# Patient Record
Sex: Female | Born: 1965
Health system: Southern US, Community
[De-identification: ages and names within clinical notes are randomized; demographics above are authoritative.]

## PROBLEM LIST (undated history)

## (undated) DIAGNOSIS — F329 Major depressive disorder, single episode, unspecified: Secondary | ICD-10-CM

## (undated) DIAGNOSIS — G40909 Epilepsy, unspecified, not intractable, without status epilepticus: Secondary | ICD-10-CM

## (undated) DIAGNOSIS — J449 Chronic obstructive pulmonary disease, unspecified: Secondary | ICD-10-CM

## (undated) DIAGNOSIS — I10 Essential (primary) hypertension: Secondary | ICD-10-CM

## (undated) DIAGNOSIS — B3731 Acute candidiasis of vulva and vagina: Secondary | ICD-10-CM

## (undated) DIAGNOSIS — F419 Anxiety disorder, unspecified: Secondary | ICD-10-CM

## (undated) DIAGNOSIS — I6529 Occlusion and stenosis of unspecified carotid artery: Secondary | ICD-10-CM

## (undated) DIAGNOSIS — B373 Candidiasis of vulva and vagina: Secondary | ICD-10-CM

## (undated) DIAGNOSIS — I4891 Unspecified atrial fibrillation: Secondary | ICD-10-CM

## (undated) DIAGNOSIS — M1990 Unspecified osteoarthritis, unspecified site: Secondary | ICD-10-CM

## (undated) DIAGNOSIS — F1020 Alcohol dependence, uncomplicated: Secondary | ICD-10-CM

## (undated) DIAGNOSIS — F32A Depression, unspecified: Secondary | ICD-10-CM

## (undated) DIAGNOSIS — K5790 Diverticulosis of intestine, part unspecified, without perforation or abscess without bleeding: Secondary | ICD-10-CM

## (undated) DIAGNOSIS — E7800 Pure hypercholesterolemia, unspecified: Secondary | ICD-10-CM

## (undated) HISTORY — PX: BUNIONECTOMY: SHX129

## (undated) HISTORY — DX: Epilepsy, unspecified, not intractable, without status epilepticus: G40.909

## (undated) HISTORY — DX: Diverticulosis of intestine, part unspecified, without perforation or abscess without bleeding: K57.90

## (undated) HISTORY — DX: Candidiasis of vulva and vagina: B37.3

## (undated) HISTORY — DX: Unspecified atrial fibrillation: I48.91

## (undated) HISTORY — DX: Depression, unspecified: F32.A

## (undated) HISTORY — DX: Alcohol dependence, uncomplicated: F10.20

## (undated) HISTORY — DX: Acute candidiasis of vulva and vagina: B37.31

## (undated) HISTORY — PX: CYST EXCISION PERINEAL: SHX6278

## (undated) HISTORY — DX: Chronic obstructive pulmonary disease, unspecified: J44.9

## (undated) HISTORY — DX: Occlusion and stenosis of unspecified carotid artery: I65.29

## (undated) HISTORY — DX: Major depressive disorder, single episode, unspecified: F32.9

## (undated) HISTORY — DX: Anxiety disorder, unspecified: F41.9

## (undated) HISTORY — DX: Unspecified osteoarthritis, unspecified site: M19.90

## (undated) HISTORY — DX: Pure hypercholesterolemia, unspecified: E78.00

---

## 2001-09-27 ENCOUNTER — Ambulatory Visit (HOSPITAL_BASED_OUTPATIENT_CLINIC_OR_DEPARTMENT_OTHER): Admission: RE | Admit: 2001-09-27 | Discharge: 2001-09-27 | Payer: Self-pay | Admitting: Obstetrics and Gynecology

## 2001-09-27 ENCOUNTER — Encounter (INDEPENDENT_AMBULATORY_CARE_PROVIDER_SITE_OTHER): Payer: Self-pay | Admitting: Specialist

## 2002-04-18 ENCOUNTER — Encounter: Payer: Self-pay | Admitting: Internal Medicine

## 2002-04-18 ENCOUNTER — Encounter: Admission: RE | Admit: 2002-04-18 | Discharge: 2002-04-18 | Payer: Self-pay | Admitting: Internal Medicine

## 2006-08-23 ENCOUNTER — Emergency Department (HOSPITAL_COMMUNITY): Admission: EM | Admit: 2006-08-23 | Discharge: 2006-08-23 | Payer: Self-pay | Admitting: Emergency Medicine

## 2006-11-29 ENCOUNTER — Encounter (HOSPITAL_BASED_OUTPATIENT_CLINIC_OR_DEPARTMENT_OTHER): Admission: RE | Admit: 2006-11-29 | Discharge: 2007-01-08 | Payer: Self-pay | Admitting: Surgery

## 2007-09-02 ENCOUNTER — Emergency Department (HOSPITAL_COMMUNITY): Admission: EM | Admit: 2007-09-02 | Discharge: 2007-09-02 | Payer: Self-pay | Admitting: Emergency Medicine

## 2009-04-29 ENCOUNTER — Emergency Department (HOSPITAL_COMMUNITY): Admission: EM | Admit: 2009-04-29 | Discharge: 2009-04-29 | Payer: Self-pay | Admitting: Emergency Medicine

## 2010-03-14 ENCOUNTER — Encounter: Payer: Self-pay | Admitting: Internal Medicine

## 2010-07-06 NOTE — Assessment & Plan Note (Signed)
[facility]   NAME:  [name], [name] NO.:  [id number]   MEDICAL RECORD NO.:  [id number]      DATE OF BIRTH:  1965/06/15   PHYSICIAN:  Maxwell Caul, M.D. VISIT DATE:  12/08/2006                                   OFFICE VISIT   PURPOSE OF TODAY'S VISIT:  [name] is seen here in follow-up from her  appointment last week for follow-up of a traumatic wound in the setting  of venous stasis.  She went on to have Prisma, hydrogel and a Profore  wrap.  She returns today in follow-up.  She is without specific  complaints other than generalized pruritus in the leg for which she was  slipping a stick down I think inside the dressing.   EXAMINATION:  Temperature is 98.7, pulse 98, respirations 20, blood  pressure 137/87.  On her right leg her wound measures 1.1 x 0.8 x 0.2.  She has a slight amount of epithelialization from the superior aspect of  the wound, rest of this looks like a dry granulating base.  There was a  scant amount of slough here but I did not feel overall this needed to be  debrided.  There was no evidence of infection.   IMPRESSION:  Traumatic wound in the setting of venous stasis.  We have  gone ahead and applied double Prisma, hydrogel and rewrapped her leg.  I  think the wound appearance is generally improved.  We will see her again  in a week.           ______________________________  Maxwell Caul, M.D.     MGR/MEDQ  D:  12/08/2006  T:  12/09/2006  Job:  [id number]

## 2010-07-06 NOTE — Assessment & Plan Note (Signed)
[facility]   NAME:  GIRTRUDE, ENSLIN NO.:  [id number]   MEDICAL RECORD NO.:  [id number]      DATE OF BIRTH:  06-23-1965   PHYSICIAN:  Jake Shark A. Tanda Rockers, M.D.      VISIT DATE:                                   OFFICE VISIT   SUBJECTIVE:  Mrs. Serratore is a 45 year old lady who is seen in followup for  right anterior traumatic injury with stasis physiology.  In the interim  she has been treated with compression wrap and a silver matrix dressing.  There has been no excessive drainage, malodor, pain, or fever.   OBJECTIVE:  Blood pressure 137/70, respirations 16, pulse of 90,  temperature 98.6.  The dorsalis pedis pulse is readily palpable.  There is associated 2+  edema.  The wound itself has healthy-appearing granulation with no  drainage and no malodor.  There is no evidence of ascending infection,  abscess, or ischemia.   ASSESSMENT:  Clinical improvement of post-traumatic stasis ulcer.   PLAN:  We will follow up.  We will place the patient in an Unna boot and  reevaluate her in 7 to 10 days.      Harold A. Tanda Rockers, M.D.  Electronically Signed     HAN/MEDQ  D:  12/15/2006  T:  12/16/2006  Job:  [id number]

## 2010-07-06 NOTE — Assessment & Plan Note (Signed)
[facility]   NAME:  Susan Todd, Susan Todd NO.:  [id number]   MEDICAL RECORD NO.:  [id number]      DATE OF BIRTH:  1965-03-25   PHYSICIAN:  Maxwell Caul, M.D. VISIT DATE:  12/22/2006                                   OFFICE VISIT   PURPOSE OF TODAY'S VISIT:  Continued followup of her originally  traumatic right anterior leg wound with underlying stasis physiology.  We have been treating her with a compression wrap in terms of an Unna.  We are seeing her back in followup.   EXAMINATION:  Temperature is 98.9, pulse 93, respirations 18, blood  pressure 145/83.  Wound over the right anterior leg on the superior aspect is scabbed.  However, inferiorly shows good evidence of epithelialization.  There is  still surrounding edema around this wound.  However there is no evidence  of infection.   IMPRESSION:  Traumatic wound in the setting of venous stasis.  This is  improved dramatically.  I have given her permission to do her own graded  pressure Ace wraps rather than Unna.  I have counseled her to make sure  to be compliant with this.  I will see her back in a week.  Will  consider removal of the can scab to check the progress of the healing  under this next week, although this may not be necessary.           ______________________________  Maxwell Caul, M.D.     MGR/MEDQ  D:  12/22/2006  T:  12/22/2006  Job:  [id number]

## 2010-07-06 NOTE — Assessment & Plan Note (Signed)
[facility]   NAME:  Susan Todd, Susan Todd NO.:  [id number]   MEDICAL RECORD NO.:  [id number]      DATE OF BIRTH:  [birth year]   PHYSICIAN:  Maxwell Caul, M.D. VISIT DATE:  12/01/2006                                   OFFICE VISIT   The patient is a 45 year old woman who tripped over a concrete slab 5-6  weeks ago traumatizing the anterior aspect of her right leg.  She  developed a wound.  She has been to her primary care doctor twice and  also her gynecologist cultured the wound that was negative.  She has  apparently been on three different courses of antibiotics.  This  included Keflex and Levaquin.  The patient states the periwound area has  been pruritic but not particularly painful.  There has been no drainage,  she has not been systemically unwell.  She states the wound has  contracted a bit in terms of length and width but she feels the depth  has increased.  She has no other history of wound related issues.  She  had a bunionectomy bilaterally which have not had any healing issues.   PAST MEDICAL HISTORY:  Depression, allergies.   CURRENT MEDICATIONS:  1. Zoloft 200 daily.  2. Topamax 50 daily.  3. Zyrtec 10 daily.  4. Ativan p.r.n.   Socially, she is a smoker.  She has mentioned works for [employer] and is up on her feet a fair amount of the time as a result of  this.   EXAMINATION:  Her temperature is 98.2, pulse 89, respirations 16, blood  pressure 115/96.  GENERAL:  She is healthy looking 45 year old.  RESPIRATORY:  Clear air entry bilaterally.  CARDIAC:  Heart sounds are normal, no murmurs, there are no signs of  congestive heart failure.  EXTREMITIES:  Her peripheral pulses are well and easily felt.   WOUND EXAM:  The only wound is on the right anterior leg slightly  lateral in the mid aspect.  It measured 1 x 1.3 x 0.3.  There was a fair  amount of periwound edema and some problems I think related to  venous  varicosities.  I did not feel the wound was currently infected.  The  wound underwent a full-thickness debridement with a #15 blade, we used  EMLA for anesthesia.  This was done to remove some whitish eschar from  the top of the wound.  Underneath this was clean granulating tissue.   IMPRESSIONS AND WOUND CARE PLAN AND FOLLOWUP:  Traumatic wound in the  face of probably some degree of venous stasis.  We applied Prisma and  hydrogel to this wound and wrapped her in a Profore wrap to control the  edema.  I did not feel she needed any current antibiotics.  We will see  her again in a week's time.           ______________________________  Maxwell Caul, M.D.     MGR/MEDQ  D:  12/01/2006  T:  12/01/2006  Job:  [id number]

## 2010-07-06 NOTE — Assessment & Plan Note (Signed)
[facility]   NAME:  Susan Todd, Susan Todd NO.:  [id number]   MEDICAL RECORD NO.:  [id number]      DATE OF BIRTH:  15-Sep-1965   PHYSICIAN:  Jake Shark A. Tanda Rockers, M.D. VISIT DATE:  01/01/2007                                   OFFICE VISIT   SUBJECTIVE:  Susan Todd returns for follow-up of a post-traumatic stasis  ulcer involving her right lower extremity.  The patient was first seen  on October 10 with a post-traumatic stasis ulcer.  She was treated with  compression therapy. She returns denying drainage, malodor pain or  fever.  She feels that the wound is essentially healed.   OBJECTIVE:  Blood pressure is 141/79, respirations 16, pulse rate 92,  temperature 98.6.  Inspection of the right anterior lower extremity  shows that the wound is completely resolved.  There is 100% granulation.  There is no edema.  Pedal pulses readily palpable.   ASSESSMENT:  Resolved post-traumatic stasis ulcer.   PLAN:  We are discharging the patient to resume her care under Dr.  Donette Larry. We will reevaluate her on a p.r.n. basis.      Harold A. Tanda Rockers, M.D.  Electronically Signed     HAN/MEDQ  D:  01/01/2007  T:  01/01/2007  Job:  [id number]   cc:   Georgann Housekeeper, MD

## 2010-07-09 NOTE — Op Note (Signed)
   TNAMEMerissa, [name]                              ACCOUNT NO.:  [id number]   MEDICAL RECORD NO.:  [id number]                    PATIENT TYPE:   LOCATION:                                       FACILITY:   PHYSICIAN:  Katherine Roan, M.D.               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   PROCEDURE:  Excision of perineal cyst.   DESCRIPTION OF PROCEDURE:  The patient was placed in lithotomy position and  prepped and draped in the usual fashion. The left perineal cyst was  infiltrated, it had been marked. It was infiltrated with 0.5% Marcaine with  epinephrine and the lesion was excised. Hemostasis with the Bovie and then I  closed the subcutaneum with 4-0 Vicryl and the skin with 5-0 nylon. The  patient tolerated the procedure well and was sent to the recovery room in  good condition.                                               Katherine Roan, M.D.    SDM/MEDQ  D:  09/27/2001  T:  09/29/2001  Job:  [id number]

## 2010-10-14 ENCOUNTER — Encounter: Payer: Self-pay | Admitting: Gynecology

## 2010-10-14 ENCOUNTER — Ambulatory Visit (INDEPENDENT_AMBULATORY_CARE_PROVIDER_SITE_OTHER): Payer: 59 | Admitting: Gynecology

## 2010-10-14 ENCOUNTER — Other Ambulatory Visit (HOSPITAL_COMMUNITY)
Admission: RE | Admit: 2010-10-14 | Discharge: 2010-10-14 | Disposition: A | Discharge status: Home / Self Care | Payer: 59 | Source: Ambulatory Visit | Attending: Gynecology | Admitting: Gynecology

## 2010-10-14 VITALS — BP 106/62 | Ht 69.0 in | Wt 166.0 lb

## 2010-10-14 DIAGNOSIS — N920 Excessive and frequent menstruation with regular cycle: Secondary | ICD-10-CM

## 2010-10-14 DIAGNOSIS — F329 Major depressive disorder, single episode, unspecified: Secondary | ICD-10-CM | POA: Insufficient documentation

## 2010-10-14 DIAGNOSIS — R42 Dizziness and giddiness: Secondary | ICD-10-CM

## 2010-10-14 DIAGNOSIS — N949 Unspecified condition associated with female genital organs and menstrual cycle: Secondary | ICD-10-CM

## 2010-10-14 DIAGNOSIS — N951 Menopausal and female climacteric states: Secondary | ICD-10-CM

## 2010-10-14 DIAGNOSIS — Z01419 Encounter for gynecological examination (general) (routine) without abnormal findings: Secondary | ICD-10-CM

## 2010-10-14 DIAGNOSIS — N643 Galactorrhea not associated with childbirth: Secondary | ICD-10-CM

## 2010-10-14 DIAGNOSIS — N938 Other specified abnormal uterine and vaginal bleeding: Secondary | ICD-10-CM

## 2010-10-14 DIAGNOSIS — N926 Irregular menstruation, unspecified: Secondary | ICD-10-CM

## 2010-10-14 DIAGNOSIS — F32A Depression, unspecified: Secondary | ICD-10-CM | POA: Insufficient documentation

## 2010-10-14 DIAGNOSIS — M1990 Unspecified osteoarthritis, unspecified site: Secondary | ICD-10-CM | POA: Insufficient documentation

## 2010-10-14 DIAGNOSIS — F419 Anxiety disorder, unspecified: Secondary | ICD-10-CM | POA: Insufficient documentation

## 2010-10-14 DIAGNOSIS — Z13220 Encounter for screening for lipoid disorders: Secondary | ICD-10-CM

## 2010-10-14 LAB — COMPREHENSIVE METABOLIC PANEL
ALT: 10 U/L (ref 0–35)
AST: 14 U/L (ref 0–37)
Albumin: 4.4 g/dL (ref 3.5–5.2)
Alkaline Phosphatase: 55 U/L (ref 39–117)
BUN: 10 mg/dL (ref 6–23)
CO2: 26 mEq/L (ref 19–32)
Calcium: 9.5 mg/dL (ref 8.4–10.5)
Chloride: 104 mEq/L (ref 96–112)
Creat: 0.68 mg/dL (ref 0.50–1.10)
Glucose, Bld: 80 mg/dL (ref 70–99)
Potassium: 3.9 mEq/L (ref 3.5–5.3)
Sodium: 140 mEq/L (ref 135–145)
Total Bilirubin: 0.4 mg/dL (ref 0.3–1.2)
Total Protein: 6.3 g/dL (ref 6.0–8.3)

## 2010-10-14 NOTE — Progress Notes (Signed)
Susan Todd Jan 09, 1966 [id number]        45 y.o.  for annual exam.  Former patient of Dr. August Saucer [facility]. She notes over the last several months her periods have become irregular where she will have a monthly bleed then bleed intermittently throughout the month. This is anywhere from light staining to relatively heavy bleeding. No history of this before usually has regular monthly menses. She is not sexually active and does not use contraception. She does also note over the last 2 weeks some dizziness and lightheadedness primarily when moving and some associated nausea. She thought this may be due to stopping a medication she was taking for her arthritis as the dizziness started after stopping it. She has no other neurologic symptoms other than some mild headaches.  No visual changes or muscle weakness or sensation changes. She does note some hot flashes and sweats and some changes in her skin with dryness and cracking of her nails. She is being seen by a therapist and currently is on antidepressant and anxiolytic medication. 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 Lastly she does note a year and a half ago some greenish discharge from her left nipple she underwent a diagnostic mammogram and ultrasound both of which were normal and no further workup was done. She notes occasional greenish discharge from her left nipple over the past year. No palpable abnormalities on self-exam.  Past medical history,surgical history, allergies, family history and social history were all reviewed and documented in the EPIC chart. ROS:  Was performed and pertinent positives and negatives are included in the history.  Exam: chaperone present Filed Vitals:   10/14/10 0955  BP: 106/62   General appearance  Normal Skin grossly normal Head/Neck normal with no cervical or supraclavicular adenopathy thyroid normal Lungs   clear Cardiac RR, without RMG Abdominal  soft, nontender, without masses, organomegaly or hernia Breasts  examined lying and sitting without masses, retractions, discharge or axillary adenopathy.  Left and right nipples shows no evidence of galactorrhea or any abnormalities. Pelvic  Ext/BUS/vagina  normal   Cervix  normal  Pap done  Uterus  anteverted, normal size, shape and contour, midline and mobile nontender   Adnexa  Without masses or tenderness    Anus and perineum  normal   Rectovaginal  normal sphincter tone without palpated masses or tenderness.    Assessment/Plan:  45 y.o. female for annual exam.    #1 Irregular menses. Patient has some symptoms to suggest menopausal changes such as hot flashes and night sweats but also is having some dry skin and nail changes to suggest thyroid. When check baseline labs to include FSH TSH as well as prolactin and schedule a baseline sonohysterogram. Various scenarios were reviewed and possibilities to include structural such as polyps fibroids or hormonal such as menopause or thyroid dysfunction or prolactin abnormalities. Patient will follow up for ultrasound, lab work and then we'll from there. #2 Galactorrhea. Patient notes a greenish discharge occasionally from her left nipple she had a diagnostic mammo-with ultrasound little over a year ago. Her exam today is normal. Recommend diagnostic mammogram as long as normal she has no palpable abnormalities and will follow. I discussed with her if this ever bloody that she needs to alert me for further evaluation. We'll also check a baseline prolactin as in #1.  #3 Dizziness.  New onset dizziness. No localizing symptoms, low-grade headache  questionable related to stopping her medication. I reviewed differential to include neurologic such as tumors or other neurologic diseases, inner ear and metabolic abnormalities. Will start again with thyroid, comprehensive metabolic panel, FSH, CBC and then go from there. I  recommended that if her dizziness continues she needs to see her primary for further evaluation possible further studies such as CT scan. #4 Health maintenance. Self breast exams on a monthly basis discussed encouraged. We will schedule the diagnostic mammogram for her she knows to expect a call and if not she will call me back to arrange. Stop smoking was discussed and encouraged and she is planning to do so this coming year. Contraception is not an issue she does understand the need if she does become sexually active. Patient will follow up her lab work and her ultrasound and we'll go from there.    Dara Lords MD, 10:33 AM 10/14/2010

## 2010-10-22 ENCOUNTER — Ambulatory Visit (INDEPENDENT_AMBULATORY_CARE_PROVIDER_SITE_OTHER): Payer: 59 | Admitting: Gynecology

## 2010-10-22 ENCOUNTER — Other Ambulatory Visit: Payer: 59

## 2010-10-22 DIAGNOSIS — N920 Excessive and frequent menstruation with regular cycle: Secondary | ICD-10-CM

## 2010-10-22 DIAGNOSIS — N946 Dysmenorrhea, unspecified: Secondary | ICD-10-CM

## 2010-10-22 NOTE — Progress Notes (Signed)
Patient presents for sonohysterogram due to irregular menses.  Her FSH did return elevated at 33.  Ultrasound shows endometrial echo at 2.7 mm right left ovaries visualized and normal uterus grossly normal. Sonohysterogram performed sterile technique, EZ catheter introduction, good distention, no evidence of abnormalities.  Endometrial sample taken  Assessment and plan: #1 Irregular menses. FSH mildly elevated sonohysterogram negative endometrial biopsy was taken. Patient will follow up for results. As long as normal we'll plan menstrual calendar as long as less frequent regular menses we'll watch if she has prolonged or significantly abnormal bleeding she'll call and I discussed possibilities for intermittent progesterone withdrawal. #2 Dizzy symptoms. He notes that the symptoms as she was describing to include dizziness headaches not feeling well actually is getting better her lab work again was normal at her visit and she'll continue to observe as long as the symptoms totally resolved and will follow if not then she is to call and we'll refer for further evaluation.

## 2010-10-26 ENCOUNTER — Other Ambulatory Visit: Payer: Self-pay | Admitting: Gynecology

## 2010-10-26 DIAGNOSIS — N920 Excessive and frequent menstruation with regular cycle: Secondary | ICD-10-CM

## 2010-10-26 DIAGNOSIS — N926 Irregular menstruation, unspecified: Secondary | ICD-10-CM

## 2010-12-07 LAB — DIFFERENTIAL
Basophils Absolute: 0
Basophils Relative: 0
Eosinophils Absolute: 0
Eosinophils Relative: 0
Lymphocytes Relative: 15
Lymphs Abs: 1.4
Monocytes Absolute: 0.4
Monocytes Relative: 4
Neutro Abs: 7.8 — ABNORMAL HIGH
Neutrophils Relative %: 81 — ABNORMAL HIGH

## 2010-12-07 LAB — I-STAT 8, (EC8 V) (CONVERTED LAB)
BUN: 11
Bicarbonate: 21.7
Chloride: 105
Glucose, Bld: 118 — ABNORMAL HIGH
HCT: 45
Hemoglobin: 15.3 — ABNORMAL HIGH
Operator id: 146091
Potassium: 4.1
Sodium: 134 — ABNORMAL LOW
TCO2: 23
pCO2, Ven: 27.2 — ABNORMAL LOW
pH, Ven: 7.511 — ABNORMAL HIGH

## 2010-12-07 LAB — CBC
HCT: 42.6
Hemoglobin: 14.4
MCHC: 33.8
MCV: 97
Platelets: 300
RBC: 4.39
RDW: 14.7 — ABNORMAL HIGH
WBC: 9.6

## 2010-12-07 LAB — URINALYSIS, ROUTINE W REFLEX MICROSCOPIC
Bilirubin Urine: NEGATIVE
Glucose, UA: NEGATIVE
Hgb urine dipstick: NEGATIVE
Ketones, ur: NEGATIVE
Nitrite: NEGATIVE
Protein, ur: NEGATIVE
Specific Gravity, Urine: 1.018
Urobilinogen, UA: 0.2
pH: 7

## 2010-12-07 LAB — RAPID URINE DRUG SCREEN, HOSP PERFORMED
Amphetamines: NOT DETECTED
Barbiturates: NOT DETECTED
Benzodiazepines: NOT DETECTED
Cocaine: NOT DETECTED
Opiates: NOT DETECTED
Tetrahydrocannabinol: NOT DETECTED

## 2010-12-07 LAB — ETHANOL: Alcohol, Ethyl (B): <5

## 2010-12-07 LAB — POCT I-STAT CREATININE
Creatinine, Ser: 0.7
Operator id: 146091

## 2010-12-07 LAB — PREGNANCY, URINE: Preg Test, Ur: NEGATIVE

## 2010-12-07 LAB — LIPASE, BLOOD: Lipase: 22

## 2011-08-10 ENCOUNTER — Other Ambulatory Visit: Payer: Self-pay | Admitting: *Deleted

## 2011-08-10 DIAGNOSIS — G43909 Migraine, unspecified, not intractable, without status migrainosus: Secondary | ICD-10-CM

## 2011-08-10 DIAGNOSIS — G9001 Carotid sinus syncope: Secondary | ICD-10-CM

## 2011-08-10 DIAGNOSIS — IMO0002 Reserved for concepts with insufficient information to code with codable children: Secondary | ICD-10-CM

## 2011-08-10 DIAGNOSIS — R110 Nausea: Secondary | ICD-10-CM

## 2011-08-12 ENCOUNTER — Ambulatory Visit
Admission: RE | Admit: 2011-08-12 | Discharge: 2011-08-12 | Disposition: A | Discharge status: Home / Self Care | Payer: BC Managed Care – PPO | Source: Ambulatory Visit | Attending: *Deleted | Admitting: *Deleted

## 2011-08-12 ENCOUNTER — Ambulatory Visit (HOSPITAL_COMMUNITY)
Admission: RE | Admit: 2011-08-12 | Discharge: 2011-08-12 | Disposition: A | Discharge status: Home / Self Care | Payer: BC Managed Care – PPO | Source: Ambulatory Visit | Attending: Internal Medicine | Admitting: Internal Medicine

## 2011-08-12 ENCOUNTER — Emergency Department (HOSPITAL_COMMUNITY)
Admission: EM | Admit: 2011-08-12 | Discharge: 2011-08-12 | Disposition: A | Discharge status: Home / Self Care | Payer: BC Managed Care – PPO | Attending: Emergency Medicine | Admitting: Emergency Medicine

## 2011-08-12 ENCOUNTER — Encounter (HOSPITAL_COMMUNITY): Payer: Self-pay | Admitting: Emergency Medicine

## 2011-08-12 ENCOUNTER — Other Ambulatory Visit: Payer: Self-pay | Admitting: Internal Medicine

## 2011-08-12 DIAGNOSIS — R51 Headache: Secondary | ICD-10-CM

## 2011-08-12 DIAGNOSIS — R110 Nausea: Secondary | ICD-10-CM

## 2011-08-12 DIAGNOSIS — F17200 Nicotine dependence, unspecified, uncomplicated: Secondary | ICD-10-CM | POA: Insufficient documentation

## 2011-08-12 DIAGNOSIS — I6529 Occlusion and stenosis of unspecified carotid artery: Secondary | ICD-10-CM

## 2011-08-12 DIAGNOSIS — M542 Cervicalgia: Secondary | ICD-10-CM

## 2011-08-12 DIAGNOSIS — G9001 Carotid sinus syncope: Secondary | ICD-10-CM

## 2011-08-12 DIAGNOSIS — R6884 Jaw pain: Secondary | ICD-10-CM | POA: Insufficient documentation

## 2011-08-12 DIAGNOSIS — G43909 Migraine, unspecified, not intractable, without status migrainosus: Secondary | ICD-10-CM

## 2011-08-12 DIAGNOSIS — R221 Localized swelling, mass and lump, neck: Secondary | ICD-10-CM

## 2011-08-12 DIAGNOSIS — IMO0002 Reserved for concepts with insufficient information to code with codable children: Secondary | ICD-10-CM

## 2011-08-12 DIAGNOSIS — M129 Arthropathy, unspecified: Secondary | ICD-10-CM | POA: Insufficient documentation

## 2011-08-12 DIAGNOSIS — R220 Localized swelling, mass and lump, head: Secondary | ICD-10-CM

## 2011-08-12 LAB — CBC
HCT: 40.6 % (ref 36.0–46.0)
Hemoglobin: 13.9 g/dL (ref 12.0–15.0)
MCH: 33.4 pg (ref 26.0–34.0)
MCHC: 34.2 g/dL (ref 30.0–36.0)
MCV: 97.6 fL (ref 78.0–100.0)
Platelets: 200 10*3/uL (ref 150–400)
RBC: 4.16 MIL/uL (ref 3.87–5.11)
RDW: 12.7 % (ref 11.5–15.5)
WBC: 7.7 10*3/uL (ref 4.0–10.5)

## 2011-08-12 LAB — BASIC METABOLIC PANEL
BUN: 13 mg/dL (ref 6–23)
CO2: 26 mEq/L (ref 19–32)
Calcium: 9.3 mg/dL (ref 8.4–10.5)
Chloride: 102 mEq/L (ref 96–112)
Creatinine, Ser: 0.65 mg/dL (ref 0.50–1.10)
GFR calc Af Amer: >90 mL/min (ref >90)
GFR calc non Af Amer: >90 mL/min (ref >90)
Glucose, Bld: 86 mg/dL (ref 70–99)
Potassium: 4.2 mEq/L (ref 3.5–5.1)
Sodium: 138 mEq/L (ref 135–145)

## 2011-08-12 LAB — DIFFERENTIAL
Basophils Absolute: 0 10*3/uL (ref 0.0–0.1)
Basophils Relative: 0 % (ref 0–1)
Eosinophils Absolute: 0.2 10*3/uL (ref 0.0–0.7)
Eosinophils Relative: 2 % (ref 0–5)
Lymphocytes Relative: 37 % (ref 12–46)
Lymphs Abs: 2.9 10*3/uL (ref 0.7–4.0)
Monocytes Absolute: 0.6 10*3/uL (ref 0.1–1.0)
Monocytes Relative: 7 % (ref 3–12)
Neutro Abs: 4.1 10*3/uL (ref 1.7–7.7)
Neutrophils Relative %: 53 % (ref 43–77)

## 2011-08-12 LAB — SEDIMENTATION RATE: Sed Rate: 7 mm/hr (ref 0–22)

## 2011-08-12 MED ORDER — GADOBENATE DIMEGLUMINE 529 MG/ML IV SOLN
20.0000 mL | Freq: Once | INTRAVENOUS | Status: AC | PRN
  Administered 2011-08-12: 20 mL via INTRAVENOUS

## 2011-08-12 MED ORDER — MORPHINE SULFATE 4 MG/ML IJ SOLN
4.0000 mg | Freq: Once | INTRAMUSCULAR | Status: AC
  Administered 2011-08-12: 4 mg via INTRAVENOUS
  Filled 2011-08-12: qty 1

## 2011-08-12 MED ORDER — ONDANSETRON HCL 4 MG/2ML IJ SOLN
4.0000 mg | Freq: Once | INTRAMUSCULAR | Status: AC
  Administered 2011-08-12: 4 mg via INTRAVENOUS
  Filled 2011-08-12: qty 2

## 2011-08-12 NOTE — ED Notes (Signed)
Luciano Cutter, RN made aware of patient status.

## 2011-08-12 NOTE — ED Notes (Signed)
Susan Todd (husband) [phone] cell phone

## 2011-08-12 NOTE — Progress Notes (Signed)
VASCULAR LAB PRELIMINARY  PRELIMINARY  PRELIMINARY  PRELIMINARY  Carotid duplex  completed.    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  No significant ICA stenosis on left.  Report called to [facility].  Patient instructed to return to office. Florestine Avers, 08/12/2011, 9:56 PM

## 2011-08-12 NOTE — Discharge Instructions (Signed)

## 2011-08-12 NOTE — ED Provider Notes (Signed)
History     CSN: [id number]  Arrival date & time 08/12/11  1721   First MD Initiated Contact with Patient 08/12/11 1824      Chief Complaint  Patient presents with  . Neck Pain    Patient is a 46 y.o. female presenting with neck pain. The history is provided by the patient.  Neck Pain  This is a new problem. The current episode started more than 2 days ago. The problem occurs constantly. The problem has been gradually worsening. The pain is associated with nothing. There has been no fever. The pain is present in the right side. The quality of the pain is described as stabbing. Radiates to: head. The pain is moderate. The symptoms are aggravated by swallowing (palpation). The pain is the same all the time. Associated symptoms include headaches. Pertinent negatives include no photophobia, no chest pain, no syncope, no numbness and no weakness.  pt presents for neck pain and headache Reports headache for at least 3 weeks.  She reports in past week she developed right sided anterior neck pain, but no trauma/falls reported.  No focal weakness.  No cp/sob.  No syncope, no dizziness.  No hearing changes.  No visual changes.  No dysarthria.  No neck trauma.  No fever.  No vomiting. She reports it hurts to swallow but no neck swelling  Had mra done of neck that showed carotid stenosis and also has carotid US done and prelim report shows 80% stenosis, but no dissection is reported on mra.  Sent to ER by PCP for further evaluation  She denies h/o CAD/CVA  Past Medical History  Diagnosis Date  . Depression   . Anxiety   . Arthritis     Past Surgical History  Procedure Date  . Bunionectomy     BOTH FEET    Family History  Problem Relation Age of Onset  . Heart disease Mother   . Cancer Father     MELANOMA  . Cancer Brother     MELANOMA  . Heart disease Maternal Aunt     History  Substance Use Topics  . Smoking status: Current Everyday Smoker -- 1.3 packs/day  . Smokeless tobacco:  Never Used  . Alcohol Use: No    OB History    Grav Para Term Preterm Abortions TAB SAB Ect Mult Living   3 2   1     2       Review of Systems  Constitutional: Negative for fever.  HENT: Positive for neck pain.   Eyes: Negative for photophobia.  Cardiovascular: Negative for chest pain and syncope.  Neurological: Positive for headaches. Negative for weakness and numbness.  All other systems reviewed and are negative.    Allergies  Codeine and Latex  Home Medications   Current Outpatient Rx  Name Route Sig Dispense Refill  . CITALOPRAM HYDROBROMIDE 40 MG PO TABS Oral Take 40 mg by mouth daily.      Marland Kitchen LORAZEPAM 1 MG PO TABS Oral Take 1 mg by mouth every 8 (eight) hours as needed. For anxiety      BP 115/76  Pulse 79  Temp 99.2 F (37.3 C) (Oral)  Resp 18  SpO2 97%  Physical Exam CONSTITUTIONAL: Well developed/well nourished HEAD AND FACE: Normocephalic/atraumatic EYES: EOMI/PERRL ENMT: Mucous membranes moist NECK: supple no meningeal signs, no bruits noted.  No visible swelling to neck. SPINE:entire spine nontender CV: S1/S2 noted, no murmurs/rubs/gallops noted LUNGS: Lungs are clear to auscultation bilaterally, no apparent distress  ABDOMEN: soft, nontender, no rebound or guarding GU:no cva tenderness NEURO: Awake/alert, facies symmetric, no arm or leg drift is noted Cranial nerves 3/4/5/6/08/29/08/11/12 tested and intact Gait normal EXTREMITIES: pulses normal, full ROM SKIN: warm, color normal PSYCH: no abnormalities of mood noted  ED Course  Procedures   Labs Reviewed  CBC  DIFFERENTIAL  BASIC METABOLIC PANEL   Mr Angiogram Neck W Wo Contrast  08/12/2011  *RADIOLOGY REPORT*  Clinical Data:  Migraine headache.  Nausea  MRI HEAD WITHOUT CONTRAST  Technique:  Multiplanar, multiecho pulse sequences of the brain and surrounding structures were obtained without intravenous contrast.  Comparison:  CT head 11/11/2005  Findings:  Ventricle size is normal.   Negative for Chiari malformation.  Pituitary gland is normal in size.  Scattered small white matter hyperintensities are present bilaterally.  Brainstem and cerebellum are normal.  No acute infarct.  There are no areas of restricted diffusion  Negative for hemorrhage.  No mass or edema is present in the brain. Normal flow void in the circle of Willis vessels at the base of the brain.  Paranasal sinuses are clear.  IMPRESSION:  No acute abnormality.  Scattered small white matter hyperintensities bilaterally which may be due to migraine headaches or chronic microvascular ischemia.  *RADIOLOGY REPORT*  Clinical Data:  Migraine headache.  Nausea  MRA NECK WITHOUT AND WITH CONTRAST  Technique:  Angiographic images of the neck were obtained using MRA technique without and with intravenous contrast.  Carotid stenosis measurements (when applicable) are obtained utilizing NASCET criteria, using the distal internal carotid diameter as the denominator.  Contrast: 20mL MULTIHANCE GADOBENATE DIMEGLUMINE 529 MG/ML IV SOLN  Comparison:   None.  Findings:  Arterial enhancement is suboptimal due to technical considerations.  Right carotid:  Right common carotid artery appears patent.  There appears to be a focal significant stenosis of the right internal carotid artery proximally.  This is difficult to quantify but may be the greater than seen 60%.  Correlate with carotid Doppler.  Left carotid:  Left carotid appears to be widely patent without significant stenosis.  Vertebral arteries:  Both vertebral arteries are equal in size and patent to the basilar.  IMPRESSION: Focal stenosis of the proximal right internal carotid artery may be a significant stenosis, and may be greater than 60% diameter stenosis.  Carotid Doppler suggested for further evaluation.  Original Report Authenticated By: Camelia Phenes, M.D.   Mr Brain Wo Contrast  08/12/2011  *RADIOLOGY REPORT*  Clinical Data:  Migraine headache.  Nausea  MRI HEAD WITHOUT  CONTRAST  Technique:  Multiplanar, multiecho pulse sequences of the brain and surrounding structures were obtained without intravenous contrast.  Comparison:  CT head 11/11/2005  Findings:  Ventricle size is normal.  Negative for Chiari malformation.  Pituitary gland is normal in size.  Scattered small white matter hyperintensities are present bilaterally.  Brainstem and cerebellum are normal.  No acute infarct.  There are no areas of restricted diffusion  Negative for hemorrhage.  No mass or edema is present in the brain. Normal flow void in the circle of Willis vessels at the base of the brain.  Paranasal sinuses are clear.  IMPRESSION:  No acute abnormality.  Scattered small white matter hyperintensities bilaterally which may be due to migraine headaches or chronic microvascular ischemia.  *RADIOLOGY REPORT*  Clinical Data:  Migraine headache.  Nausea  MRA NECK WITHOUT AND WITH CONTRAST  Technique:  Angiographic images of the neck were obtained using MRA technique without and with  intravenous contrast.  Carotid stenosis measurements (when applicable) are obtained utilizing NASCET criteria, using the distal internal carotid diameter as the denominator.  Contrast: 20mL MULTIHANCE GADOBENATE DIMEGLUMINE 529 MG/ML IV SOLN  Comparison:   None.  Findings:  Arterial enhancement is suboptimal due to technical considerations.  Right carotid:  Right common carotid artery appears patent.  There appears to be a focal significant stenosis of the right internal carotid artery proximally.  This is difficult to quantify but may be the greater than seen 60%.  Correlate with carotid Doppler.  Left carotid:  Left carotid appears to be widely patent without significant stenosis.  Vertebral arteries:  Both vertebral arteries are equal in size and patent to the basilar.  IMPRESSION: Focal stenosis of the proximal right internal carotid artery may be a significant stenosis, and may be greater than 60% diameter stenosis.  Carotid  Doppler suggested for further evaluation.  Original Report Authenticated By: Camelia Phenes, M.D.   7:03 PM Spoke to dr Myra Gianotti with vascular  We discussed her neuro exam (no focal findings) Unlikely not a carotid dissection given not seen on mra and not seen on carotid ultrasound He recommends check labs/sed rate to r/o temporal arteritis.  Treat pain.  She can f/u early next week but does not need urgent admission/repair   Pt improved, feels well for d/c home Discussed strict return precautions We discussed need for vascular followup Also discussed need to stay out of work until vascular eval   MDM  Nursing notes including past medical history and social history reviewed and considered in documentation labs/vitals reviewed and considered Previous records reviewed and considered - mra results reviewed         Joya Gaskins, MD 08/12/11 2031

## 2011-08-12 NOTE — ED Notes (Signed)
Pt reports having R sided neck pain X 5days that radiates up to head; describes as aching stabbing pain; unsure of injury; pt able to move head

## 2011-08-15 ENCOUNTER — Other Ambulatory Visit: Payer: BC Managed Care – PPO

## 2011-08-19 ENCOUNTER — Encounter: Payer: Self-pay | Admitting: Surgery

## 2011-08-22 ENCOUNTER — Encounter: Payer: Self-pay | Admitting: Surgery

## 2011-08-22 ENCOUNTER — Other Ambulatory Visit (INDEPENDENT_AMBULATORY_CARE_PROVIDER_SITE_OTHER): Payer: BC Managed Care – PPO | Admitting: *Deleted

## 2011-08-22 ENCOUNTER — Ambulatory Visit (INDEPENDENT_AMBULATORY_CARE_PROVIDER_SITE_OTHER): Payer: BC Managed Care – PPO | Admitting: Surgery

## 2011-08-22 VITALS — BP 119/76 | HR 73 | Temp 98.6°F | Ht 70.0 in | Wt 174.0 lb

## 2011-08-22 DIAGNOSIS — I6529 Occlusion and stenosis of unspecified carotid artery: Secondary | ICD-10-CM

## 2011-08-22 NOTE — Progress Notes (Signed)
[facility] [place]   Patient name: Susan Todd MRN: [id number] DOB: 03/14/1965 Sex: female   Referred by: Dr. Donette Larry  Reason for referral:  Chief Complaint  Patient presents with  . Carotid    New pt    HISTORY OF PRESENT ILLNESS: The patient comes in today for evaluation of carotid occlusive disease. She recently developed headaches which were severe. They were associated with nausea as well as a right neck pain. She underwent a MRI which raised the possibility of a stenosis within the right internal carotid artery. This was further evaluated with carotid duplex which revealed greater than 80% stenosis. Initially, there was concern over a dissection however the MRI and carotid ultrasound to exclude this possibility. And she does not endorse any neurologic symptoms. Specifically she denies numbness or weakness in either extremity. She denies slurred speech. She denies amaurosis fugax. She still states that she has a headache but is much better.  The patient does have a significant smoking history. She also states that she suffers from mild COPD as a result of her cigarette smoking. She has occasional irregular heartbeat and is managed also for anemia.  Past Medical History  Diagnosis Date  . Depression   . Anxiety   . Arthritis   . AAA (abdominal aortic aneurysm)   . Irregular heartbeat   . COPD (chronic obstructive pulmonary disease)     Past Surgical History  Procedure Date  . Bunionectomy     BOTH FEET    History   Social History  . Marital Status: Married    Spouse Name: N/A    Number of Children: N/A  . Years of Education: N/A   Occupational History  . Not on file.   Social History Main Topics  . Smoking status: Current Everyday Smoker -- 1.5 packs/day for 30 years    Types: Cigarettes  . Smokeless tobacco: Never Used  . Alcohol Use: No  . Drug Use: No     abused drugs in the past 1989  . Sexually Active: No   Other Topics Concern  .  Not on file   Social History Narrative  . No narrative on file    Family History  Problem Relation Age of Onset  . Heart disease Mother   . Cancer Father     MELANOMA  . Cancer Brother     MELANOMA  . Heart disease Maternal Aunt     Allergies as of 08/22/2011 - Review Complete 08/22/2011  Allergen Reaction Noted  . Codeine Nausea Only   . Latex Hives     Current Outpatient Prescriptions on File Prior to Visit  Medication Sig Dispense Refill  . citalopram (CELEXA) 40 MG tablet Take 40 mg by mouth daily.        Marland Kitchen LORazepam (ATIVAN) 1 MG tablet Take 1 mg by mouth every 8 (eight) hours as needed. For anxiety         REVIEW OF SYSTEMS: Cardiovascular: Positive for palpitations Pulmonary: Positive for shortness of breath with exertion Neurologic: No weakness, paresthesias, aphasia, or amaurosis. No dizziness. Hematologic: No bleeding problems or clotting disorders. Musculoskeletal: No joint pain or joint swelling. Gastrointestinal: No blood in stool or hematemesis Genitourinary: No dysuria or hematuria. Psychiatric:: No history of major depression. Integumentary: No rashes or ulcers. Constitutional: No fever or chills.  PHYSICAL EXAMINATION: General: The patient appears their stated age.  Vital signs are BP 119/76  Pulse 73  Temp 98.6 F (37 C) (Oral)  Ht 5\' 10"  (1.778 m)  Wt 174 lb (78.926 kg)  BMI 24.97 kg/m2  SpO2 100% HEENT:  No gross abnormalities Pulmonary: Respirations are non-labored Abdomen: Soft and non-tender  Musculoskeletal: There are no major deformities.   Neurologic: No focal weakness or paresthesias are detected, Skin: There are no ulcer or rashes noted. Psychiatric: The patient has normal affect. Cardiovascular: There is a regular rate and rhythm without significant murmur appreciated. No carotid bruits. Palpable pedal pulses  Diagnostic Studies: I repeat her carotid ultrasound today. In our office there is a stenosis about 1.3 cm distal to the  carotid bifurcation. Velocities suggest a high and 60-79% stenosis. End diastolic velocities were 1 10 cm/s  Outside Studies/Documentation Historical records were reviewed.  They showed a MRI which revealed approximately 60% carotid stenosis and a ultrasound which revealed greater than 80% stenosis Medication Changes: None  Assessment:  Asymptomatic carotid stenosis, right Plan: I do not believe that her carotid stenosis is related to her headaches or neck pain. I believe this is an incidental finding during her workup. I discussed the role of medical therapy in this situation. I stressed the need for smoking cessation. To be 46 years old and had significant carotid occlusive disease is very young. Prior to proceeding with any form of carotid intervention I feel like we need to get her risk factors under control. She will need to have her cholesterol evaluated if it has not already been done. I would prefer to start her on a statin if at all possible. I've also stressed the importance of blood pressure control. Her blood pressure was normal in the office today. I started her on a baby aspirin today as well. I told her I would like her to see me in 6 months after she has initiated these risks factors modifications. At that time we will repeat her ultrasound and make recommendations based on that. We discussed the possibility of stroke both with intervention and without intervention. I told her that she is at risk for stroke because of her findings. I told her to go to the emergency department should be happen. She will see me back in 6 months.     Jorge Ny, M.D. [facility] [place] Office: [phone] Pager:  [phone]

## 2011-08-22 NOTE — Addendum Note (Signed)
Addended by: Sharee Pimple on: 08/22/2011 12:01 PM   Modules accepted: Orders

## 2011-08-29 NOTE — Procedures (Unsigned)
CAROTID DUPLEX EXAM  INDICATION:  Follow up carotid disease.  HISTORY: Diabetes:  No Cardiac:  No Hypertension:  Yes Smoking:  Yes Previous Surgery:  No CV History:  Headaches Amaurosis Fugax No, Paresthesias No, Hemiparesis No                                      RIGHT             LEFT Brachial systolic pressure:         115               119 Brachial Doppler waveforms: Vertebral direction of flow:        Antegrade DUPLEX VELOCITIES (cm/sec) CCA peak systolic                   87 ECA peak systolic                   117 ICA peak systolic                   285 ICA end diastolic                   110 PLAQUE MORPHOLOGY:                  Homogeneous PLAQUE AMOUNT:                      Moderate to severe PLAQUE LOCATION:                    ICA  IMPRESSION: 1. Focal stenosis of the right ICA approximately 1.3 cm distal to the     bifurcation. 2. Velocities suggest high-end 60% to 79% stenosis. 3. The ICA appears normal beyond the lesion.  ___________________________________________ V. Charlena Cross, MD  LT/MEDQ  D:  08/22/2011  T:  08/22/2011  Job:  [id number]

## 2012-03-05 ENCOUNTER — Ambulatory Visit: Payer: BC Managed Care – PPO | Admitting: Surgery

## 2012-03-05 ENCOUNTER — Other Ambulatory Visit: Payer: BC Managed Care – PPO

## 2012-03-16 ENCOUNTER — Encounter: Payer: Self-pay | Admitting: Surgery

## 2012-03-19 ENCOUNTER — Encounter: Payer: Self-pay | Admitting: Surgery

## 2012-03-19 ENCOUNTER — Ambulatory Visit (INDEPENDENT_AMBULATORY_CARE_PROVIDER_SITE_OTHER): Payer: 59 | Admitting: Surgery

## 2012-03-19 ENCOUNTER — Other Ambulatory Visit (INDEPENDENT_AMBULATORY_CARE_PROVIDER_SITE_OTHER): Payer: 59 | Admitting: *Deleted

## 2012-03-19 VITALS — BP 121/69 | HR 59 | Ht 70.0 in | Wt 182.2 lb

## 2012-03-19 DIAGNOSIS — I6529 Occlusion and stenosis of unspecified carotid artery: Secondary | ICD-10-CM

## 2012-03-19 NOTE — Progress Notes (Signed)
[facility] [place]   Patient name: Susan Todd MRN: [id number] DOB: Mar 13, 1965 Sex: female     Chief Complaint  Patient presents with  . Carotid    6 month f/u     HISTORY OF PRESENT ILLNESS: The patient is back today for followup of her right carotid stenosis. I initially met her 6 months ago. At that time she had undergone a MR I. which had suggested carotid stenosis and the possibility of a dissection. She also had a carotid ultrasound which showed greater than 80% stenosis. The stents were obtained secondary to headaches as well as right facial and neck pain. Her ultrasound was repeated in my office, and it looked more consistent with a 60-79% stenosis. The patient was asymptomatic from a carotid perspective. I made the decision to manage her medically as she continued to smoke and had not then on cholesterol medication. She comes in today for a repeat carotid ultrasound. Since she was last year she has had increasing stressors in her life, including her son having to car accidents leaving him with some neurologic deficits. She has not been able to quit smoking but has cutback. She has tried to manage her cholesterol from a dietary perspective. She is scheduled to see Dr. Donette Larry in the immediate future. She has not had any neurologic symptoms since I last saw her. Specifically she has not had an episode of amaurosis fugax, slurred speech, numbness or weakness in either extremity. Her symptoms had originally led her to this point have resolved. They have been extruded distress.  Past Medical History  Diagnosis Date  . Depression   . Anxiety   . Arthritis   . AAA (abdominal aortic aneurysm)   . Irregular heartbeat   . COPD (chronic obstructive pulmonary disease)     Past Surgical History  Procedure Date  . Bunionectomy     BOTH FEET    History   Social History  . Marital Status: Married    Spouse Name: N/A    Number of Children: N/A  . Years of Education:  N/A   Occupational History  . Not on file.   Social History Main Topics  . Smoking status: Current Every Day Smoker -- 1.0 packs/day for 30 years    Types: Cigarettes  . Smokeless tobacco: Never Used     Comment: pt states that she knows she needs to quit but has been stressed and uses cigs as a resort  . Alcohol Use: No  . Drug Use: No     Comment: abused drugs in the past 1989  . Sexually Active: No   Other Topics Concern  . Not on file   Social History Narrative  . No narrative on file    Family History  Problem Relation Age of Onset  . Heart disease Mother   . Diabetes Mother   . Hyperlipidemia Mother   . Cancer Father     MELANOMA  . Cancer Brother     MELANOMA  . Hyperlipidemia Brother   . Heart disease Maternal Aunt     Allergies as of 03/19/2012 - Review Complete 03/19/2012  Allergen Reaction Noted  . Codeine Nausea Only   . Latex Hives     Current Outpatient Prescriptions on File Prior to Visit  Medication Sig Dispense Refill  . citalopram (CELEXA) 40 MG tablet Take 40 mg by mouth daily.        Marland Kitchen LORazepam (ATIVAN) 1 MG tablet Take 1 mg by  mouth every 8 (eight) hours as needed. For anxiety      . urea (CARMOL) 40 % CREA Apply topically 2 (two) times daily.      Marland Kitchen HYDROcodone-acetaminophen (VICODIN) 5-500 MG per tablet Take 5 tablets by mouth every 6 (six) hours as needed.      . terbinafine (LAMISIL) 250 MG tablet Take 250 mg by mouth daily.         REVIEW OF SYSTEMS: Positive for shortness of breath with exertion, varicose veins. History of depression All of the systems are negative as documented by the patient and the encounter form  PHYSICAL EXAMINATION:   Vital signs are BP 121/69  Pulse 59  Ht 5\' 10"  (1.778 m)  Wt 182 lb 3.2 oz (82.645 kg)  BMI 26.14 kg/m2  SpO2 100% General: The patient appears their stated age. HEENT:  No gross abnormalities Pulmonary:  Non labored breathing Musculoskeletal: There are no major deformities. Neurologic:  No focal weakness or paresthesias are detected, Skin: There are no ulcer or rashes noted. Psychiatric: The patient has normal affect. Cardiovascular: There is a regular rate and rhythm without significant murmur appreciated. No carotid bruits   Diagnostic Studies Ultrasound was reviewed today. This shows 60-79% stenosis of the right internal carotid artery. There is a suggestion that this could be due to change and vessel diameter. Approximately 40% left carotid stenosis is identified. This is not significantly changed from the study in July 2013.  Assessment: Asymptomatic bilateral carotid stenosis, right greater than left Plan: Since the patient continues to be less than 80% stenosis on the right, I would again recommend medical therapy. I have encouraged her to cut back on her cigarettes and try to completely stop smoking. I also told her that I feel strongly she should be on a statin, almost regardless of her cholesterol profile given her advanced atherosclerotic disease is such a young age. We again went over the signs and symptoms of TIA/stroke and what to do should these occur. I will have her come back to see me in one year with a repeat carotid ultrasound.  Jorge Ny, M.D. [facility] [place] Office: [phone] Pager:  [phone]

## 2012-03-19 NOTE — Addendum Note (Signed)
Addended by: Sharee Pimple on: 03/19/2012 01:50 PM   Modules accepted: Orders

## 2012-11-21 ENCOUNTER — Ambulatory Visit (INDEPENDENT_AMBULATORY_CARE_PROVIDER_SITE_OTHER): Payer: 59 | Admitting: Women's Health

## 2012-11-21 ENCOUNTER — Encounter: Payer: Self-pay | Admitting: Women's Health

## 2012-11-21 DIAGNOSIS — B373 Candidiasis of vulva and vagina: Secondary | ICD-10-CM

## 2012-11-21 DIAGNOSIS — Z113 Encounter for screening for infections with a predominantly sexual mode of transmission: Secondary | ICD-10-CM

## 2012-11-21 DIAGNOSIS — N898 Other specified noninflammatory disorders of vagina: Secondary | ICD-10-CM

## 2012-11-21 LAB — WET PREP FOR TRICH, YEAST, CLUE
Clue Cells Wet Prep HPF POC: NONE SEEN
Trich, Wet Prep: NONE SEEN

## 2012-11-21 MED ORDER — FLUCONAZOLE 150 MG PO TABS: 150.0000 mg | ORAL_TABLET | Freq: Once | ORAL | Status: DC

## 2012-11-21 NOTE — Progress Notes (Signed)
Patient ID: Susan Todd, female   DOB: [birth year], 47 y.o.   MRN: [id number]  Patient presents with vaginal pain with curdy white discharge x 1 day.  States similar to yeast infection in the past.  Minimal pelvic discomfort for a few days, but relates to starting her period today. New partner within  past 3 months/no contraception. Denies vaginal itching, burning, or urinary symptoms.  Reports rash in her buttock region for the past 12 days, which presented 2 days after intercourse.  It was itchy at first, but has resolved.  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 Smoker. Cycles every 1-3 months, FSH 33  2012.  Exam:  External genitalia within normal limits without discharge or erythema.  Vaginal walls pink and moist with minimal white adherent discharge.  No CMT or pain during speculum exam.  Buttocks showed a few dispersed erythematous flat papules without vesicular appearance.  GC and chlamydia taken, Wet prep  positive for yeast.    Yeast Heat Rash STD screen  Plan:  Fluconazole 150 mg tablet with refill if needed.  Yeast prevention reviewed. Contraception reviewed/condoms encouraged.  Instructed to call if rash persists. GC and Chlamydia cultures pending. HIV, RPR, Hep B, C, RPR. Keep scheduled annual exam with Dr Audie Box.

## 2012-11-21 NOTE — Patient Instructions (Addendum)
Monilial Vaginitis  Vaginitis in a soreness, swelling and redness (inflammation) of the vagina and vulva. Monilial vaginitis is not a sexually transmitted infection.  CAUSES   Yeast vaginitis is caused by yeast (candida) that is normally found in your vagina. With a yeast infection, the candida has overgrown in number to a point that upsets the chemical balance.  SYMPTOMS   · White, thick vaginal discharge.  · Swelling, itching, redness and irritation of the vagina and possibly the lips of the vagina (vulva).  · Burning or painful urination.  · Painful intercourse.  DIAGNOSIS   Things that may contribute to monilial vaginitis are:  · Postmenopausal and virginal states.  · Pregnancy.  · Infections.  · Being tired, sick or stressed, especially if you had monilial vaginitis in the past.  · Diabetes. Good control will help lower the chance.  · Birth control pills.  · Tight fitting garments.  · Using bubble bath, feminine sprays, douches or deodorant tampons.  · Taking certain medications that kill germs (antibiotics).  · Sporadic recurrence can occur if you become ill.  TREATMENT   Your caregiver will give you medication.  · There are several kinds of anti monilial vaginal creams and suppositories specific for monilial vaginitis. For recurrent yeast infections, use a suppository or cream in the vagina 2 times a week, or as directed.  · Anti-monilial or steroid cream for the itching or irritation of the vulva may also be used. Get your caregiver's permission.  · Painting the vagina with methylene blue solution may help if the monilial cream does not work.  · Eating yogurt may help prevent monilial vaginitis.  HOME CARE INSTRUCTIONS   · Finish all medication as prescribed.  · Do not have sex until treatment is completed or after your caregiver tells you it is okay.  · Take warm sitz baths.  · Do not douche.  · Do not use tampons, especially scented ones.  · Wear cotton underwear.  · Avoid tight pants and panty  hose.  · Tell your sexual partner that you have a yeast infection. They should go to their caregiver if they have symptoms such as mild rash or itching.  · Your sexual partner should be treated as well if your infection is difficult to eliminate.  · Practice safer sex. Use condoms.  · Some vaginal medications cause latex condoms to fail. Vaginal medications that harm condoms are:  · Cleocin cream.  · Butoconazole (Femstat®).  · Terconazole (Terazol®) vaginal suppository.  · Miconazole (Monistat®) (may be purchased over the counter).  SEEK MEDICAL CARE IF:   · You have a temperature by mouth above 102° F (38.9° C).  · The infection is getting worse after 2 days of treatment.  · The infection is not getting better after 3 days of treatment.  · You develop blisters in or around your vagina.  · You develop vaginal bleeding, and it is not your menstrual period.  · You have pain when you urinate.  · You develop intestinal problems.  · You have pain with sexual intercourse.  Document Released: 11/17/2004 Document Revised: 05/02/2011 Document Reviewed: 08/01/2008  ExitCare® Patient Information ©2014 [employer].

## 2012-11-21 NOTE — Addendum Note (Signed)
Addended by: Harrington Challenger on: 11/21/2012 04:56 PM   Modules accepted: Level of Service

## 2012-11-22 LAB — GC/CHLAMYDIA PROBE AMP
CT Probe RNA: NEGATIVE
GC Probe RNA: NEGATIVE

## 2012-11-23 ENCOUNTER — Other Ambulatory Visit: Payer: 59

## 2012-11-27 ENCOUNTER — Encounter: Payer: Self-pay | Admitting: Gynecology

## 2012-11-27 ENCOUNTER — Ambulatory Visit (INDEPENDENT_AMBULATORY_CARE_PROVIDER_SITE_OTHER): Payer: 59 | Admitting: Gynecology

## 2012-11-27 ENCOUNTER — Other Ambulatory Visit (HOSPITAL_COMMUNITY)
Admission: RE | Admit: 2012-11-27 | Discharge: 2012-11-27 | Disposition: A | Discharge status: Home / Self Care | Payer: 59 | Source: Ambulatory Visit | Attending: Gynecology | Admitting: Gynecology

## 2012-11-27 VITALS — BP 124/70 | Ht 69.0 in | Wt 162.0 lb

## 2012-11-27 DIAGNOSIS — Z01419 Encounter for gynecological examination (general) (routine) without abnormal findings: Secondary | ICD-10-CM

## 2012-11-27 DIAGNOSIS — Z309 Encounter for contraceptive management, unspecified: Secondary | ICD-10-CM

## 2012-11-27 DIAGNOSIS — R21 Rash and other nonspecific skin eruption: Secondary | ICD-10-CM

## 2012-11-27 DIAGNOSIS — Z1151 Encounter for screening for human papillomavirus (HPV): Secondary | ICD-10-CM | POA: Insufficient documentation

## 2012-11-27 DIAGNOSIS — Z113 Encounter for screening for infections with a predominantly sexual mode of transmission: Secondary | ICD-10-CM

## 2012-11-27 MED ORDER — BETAMETHASONE DIPROPIONATE AUG 0.05 % EX CREA: TOPICAL_CREAM | Freq: Two times a day (BID) | CUTANEOUS | Status: DC

## 2012-11-27 NOTE — Patient Instructions (Addendum)
Call to Schedule your mammogram  Facilities in [place]: 1)  [facility], [address] Phone: [phone] 2)  [facility]. [facility], [address] Phone: [phone] 3)  Dr. Bertrand at [facility]  [address] Phone: [phone]     Mammogram A mammogram is an X-ray test to find changes in a woman's breast. You should get a mammogram if:  You are 40 years of age or older  You have risk factors.   Your doctor recommends that you have one.  BEFORE THE TEST  Do not schedule the test the week before your period, especially if your breasts are sore during this time.  On the day of your mammogram:  Wash your breasts and armpits well. After washing, do not put on any deodorant or talcum powder on until after your test.   Eat and drink as you usually do.   Take your medicines as usual.   If you are diabetic and take insulin, make sure you:   Eat before coming for your test.   Take your insulin as usual.   If you cannot keep your appointment, call before the appointment to cancel. Schedule another appointment.  TEST  You will need to undress from the waist up. You will put on a hospital gown.   Your breast will be put on the mammogram machine, and it will press firmly on your breast with a piece of plastic called a compression paddle. This will make your breast flatter so that the machine can X-ray all parts of your breast.   Both breasts will be X-rayed. Each breast will be X-rayed from above and from the side. An X-ray might need to be taken again if the picture is not good enough.   The mammogram will last about 15 to 30 minutes.  AFTER THE TEST Finding out the results of your test Ask when your test results will be ready. Make sure you get your test results.  Document Released: 05/06/2008 Document Revised: 01/27/2011 Document Reviewed: 05/06/2008 ExitCare Patient  Information 2012 [employer].   

## 2012-11-27 NOTE — Progress Notes (Signed)
ZETA BUCY 01-12-1966 [id number]        47 y.o.  [id number] for annual exam.  Several issues noted below.  Past medical history,surgical history, medications, allergies, family history and social history were all reviewed and documented in the EPIC chart.  ROS:  Performed and pertinent positives and negatives are included in the history, assessment and plan .  Exam: Kim assistant Filed Vitals:   11/27/12 1223  BP: 124/70  Height: 5\' 9"  (1.753 m)  Weight: 162 lb (73.483 kg)   General appearance  Normal Skin grossly normal excepting papular rash right buttocks at the intersection with thigh Head/Neck normal with no cervical or supraclavicular adenopathy thyroid normal Lungs  clear Cardiac RR, without RMG Abdominal  soft, nontender, without masses, organomegaly or hernia Breasts  examined lying and sitting without masses, retractions, discharge or axillary adenopathy. Pelvic  Ext/BUS/vagina  normal  Cervix  normal Pap/HPV  Uterus  axial to anteverted, normal size, shape and contour, midline and mobile nontender   Adnexa  Without masses or tenderness    Anus and perineum  normal   Rectovaginal  normal sphincter tone without palpated masses or tenderness.    Assessment/Plan:  47 y.o. [id number] female for annual exam regular menses, condom/nothing birth control.   1. Contraceptive management. Patient using condoms occasionally. Reviewed risk of pregnancy although unlikely at age 50 certainly possible. Options for contraception reviewed. Given her age and cigarette smoking strongly urged her to consider IUD. Alternatives for progesterone only to include Depo-Provera and nexplanon also discussed. Sterilization reviewed. IUD insertional process and risks to include perforation/migration requiring surgery to remove, infection, hormonal side effects such as headaches breast tenderness acne and failure with pregnancy all discussed. Potential for thrombosis with progesterone only also reviewed.  Literature was provided and patient is going to followup if she chooses. 2. Regular but heavy menses. Was evaluated 2 years ago with negative sonohysterogram and negative endometrial biopsy. Menses have actually regulated as far as coming monthly but last 7 days. Reviewed that Mirena IUD but addressed both for contraception and heavy menses. Patient again will followup with her decision. 3. Buttocks rash. Present for approximately 2 weeks. Questionable bug bite. Not classic for herpes, shingles, psoriasis or other rashes. Treat with Diprolene 0.05% cream twice a day. Dermatology referral if persists. 4. Mammogram. Patient has not had for several years. Strongly urged her to schedule this and she agrees to do so. SBE monthly reviewed. 5. Pap smear 2012. Pap/HPV done today. No history of abnormal Pap smears previously. 6.  STD screening. Had negative GC chlamydia screen last week when she saw [place]. Found out that her partner has been unfaithful. Asked for serum screening I ordered HIV RPR hepatitis B hepatitis C. 7. Stop smoking. I again discussed the need to stop smoking as has her primary physician. She acknowledges my recommendation. 8. Health maintenance. No routine blood work done as she reports this all done through her primary physician's office. Followup with contraceptive decision otherwise annually.  Note: This document was prepared with digital dictation and possible smart phrase technology. Any transcriptional errors that result from this process are unintentional.   Dara Lords MD, 12:53 PM 11/27/2012

## 2012-11-27 NOTE — Addendum Note (Signed)
Addended by: Dayna Barker on: 11/27/2012 01:05 PM   Modules accepted: Orders

## 2012-11-28 ENCOUNTER — Telehealth: Payer: Self-pay | Admitting: *Deleted

## 2012-11-28 LAB — HEPATITIS B SURFACE ANTIGEN: Hepatitis B Surface Ag: NEGATIVE

## 2012-11-28 LAB — HIV ANTIBODY (ROUTINE TESTING W REFLEX): HIV: NONREACTIVE

## 2012-11-28 LAB — HEPATITIS C ANTIBODY: HCV Ab: NEGATIVE

## 2012-11-28 LAB — RPR

## 2012-11-28 NOTE — Telephone Encounter (Signed)
Pt informed with negative STD & HIV results from OV 11/27/12

## 2012-12-27 ENCOUNTER — Other Ambulatory Visit: Payer: Self-pay

## 2013-01-31 ENCOUNTER — Other Ambulatory Visit: Payer: Self-pay | Admitting: Gynecology

## 2013-03-18 ENCOUNTER — Ambulatory Visit: Payer: 59 | Admitting: Family

## 2013-03-18 ENCOUNTER — Other Ambulatory Visit (HOSPITAL_COMMUNITY): Payer: 59

## 2013-03-19 ENCOUNTER — Encounter: Payer: Self-pay | Admitting: Family

## 2013-03-20 ENCOUNTER — Encounter: Payer: Self-pay | Admitting: Family

## 2013-03-20 ENCOUNTER — Ambulatory Visit (HOSPITAL_COMMUNITY)
Admission: RE | Admit: 2013-03-20 | Discharge: 2013-03-20 | Disposition: A | Discharge status: Home / Self Care | Payer: 59 | Source: Ambulatory Visit | Attending: Family | Admitting: Family

## 2013-03-20 ENCOUNTER — Ambulatory Visit (INDEPENDENT_AMBULATORY_CARE_PROVIDER_SITE_OTHER): Payer: 59 | Admitting: Family

## 2013-03-20 VITALS — BP 124/84 | HR 80 | Resp 16 | Ht 70.0 in | Wt 161.0 lb

## 2013-03-20 DIAGNOSIS — I6529 Occlusion and stenosis of unspecified carotid artery: Secondary | ICD-10-CM

## 2013-03-20 DIAGNOSIS — I658 Occlusion and stenosis of other precerebral arteries: Secondary | ICD-10-CM | POA: Insufficient documentation

## 2013-03-20 NOTE — Progress Notes (Signed)
Established Carotid Patient  History of Present Illness  Susan Todd is a 48 y.o. female patient of Dr. Myra GianottiBrabham who has known carotid stenosis. She returns today for routine surveillance. Pt states 2014 was a tumultuous year and has trouble quitting smoking.  Patient has Negative history of TIA or stroke symptom.  The patient denies amaurosis fugax or monocular blindness.  The patient  denies facial drooping.  Pt. denies hemiplegia.  The patient denies receptive or expressive aphasia.  Pt. denies extremity weakness. Has been taking a statin since her last visit. Pt denies claudication symptoms, has folliculitis on left buttocks that after several weeks of treatment is improving albeit slowly.   Pt denies New Medical or Surgical History.  Pt Diabetic: No Pt smoker: smoker  (1 ppd x 30 yrs)  Pt meds include: Statin : Yes ASA: Yes Other anticoagulants/antiplatelets: no   Past Medical History  Diagnosis Date  . Depression   . Anxiety   . Arthritis   . Carotid artery occlusion   . COPD (chronic obstructive pulmonary disease)     Emphysema    Social History History  Substance Use Topics  . Smoking status: Current Every Day Smoker -- 1.00 packs/day for 30 years    Types: Cigarettes  . Smokeless tobacco: Never Used     Comment: pt states that she knows she needs to quit but has been stressed and uses cigs as a resort  . Alcohol Use: No    Family History Family History  Problem Relation Age of Onset  . Heart disease Mother   . Hyperlipidemia Mother   . Dementia Mother   . Hypertension Mother   . Cancer Father     MELANOMA  . Cancer Brother     MELANOMA  . Hyperlipidemia Brother   . Heart disease Maternal Aunt     Surgical History Past Surgical History  Procedure Laterality Date  . Bunionectomy      BOTH FEET    Allergies  Allergen Reactions  . Codeine Nausea Only    EXTREME  . Latex Hives    Current Outpatient Prescriptions  Medication Sig Dispense  Refill  . acetaminophen (TYLENOL) 325 MG tablet Take 650 mg by mouth every 6 (six) hours as needed.      Marland Kitchen. aspirin 81 MG tablet Take 81 mg by mouth daily.      Marland Kitchen. atorvastatin (LIPITOR) 10 MG tablet Take 10 mg by mouth daily.      Marland Kitchen. augmented betamethasone dipropionate (DIPROLENE-AF) 0.05 % cream apply to affected area twice daily.  50 g  1  . citalopram (CELEXA) 40 MG tablet Take 40 mg by mouth daily.        Marland Kitchen. LORazepam (ATIVAN) 1 MG tablet Take 1 mg by mouth every 8 (eight) hours as needed. For anxiety      . mupirocin ointment (BACTROBAN) 2 % Apply topically 2 (two) times daily.      . Oxcarbazepine (TRILEPTAL) 300 MG tablet Take by mouth at bedtime as needed.      . terbinafine (LAMISIL) 250 MG tablet Take 250 mg by mouth daily.      . urea (CARMOL) 40 % CREA Apply topically 2 (two) times daily.       No current facility-administered medications for this visit.    Review of Systems : See HPI for pertinent positives and negatives.  Physical Examination  Filed Vitals:   03/20/13 1112  BP: 124/84  Pulse: 80  Resp: 16  Filed Weights   03/20/13 1112  Weight: 161 lb (73.029 kg)    General: WDWN female in NAD GAIT: normal Eyes: PERRLA Pulmonary:  CTAB, Negative  Rales, Negative rhonchi, & Negative wheezing.  Cardiac: regular Rhythm ,  Negative Murmurs.  VASCULAR EXAM Carotid Bruits Left Right   Negative Negative     Radial pulses are 2+ palpable and equal.                                                                                                                            LE Pulses LEFT RIGHT       POPLITEAL   palpable    palpable       POSTERIOR TIBIAL   palpable    palpable        DORSALIS PEDIS      ANTERIOR TIBIAL not palpable  not palpable     Gastrointestinal: soft, nontender, BS WNL, no r/g,  negative masses.  Musculoskeletal: Negative muscle atrophy/wasting. M/S 5/5 throughout, Extremities without ischemic changes.  Neurologic: A&O X 3; Appropriate  Affect ; SENSATION ;normal;  Speech is normal CN 2-12 intact, Pain and light touch intact in extremities, Motor exam as listed above.   Non-Invasive Vascular Imaging CAROTID DUPLEX 03/20/2013   Right ICA: 60 - 79 % stenosis. Left ICA: <40% stenosis.  These findings are Unchanged from previous exam.   Assessment: Susan Todd is a 48 y.o. female who presents with asymptomatic 60 - 79 % Right ICA  Stenosis and minimal left ICA stenosis. The  ICA stenosis is  Unchanged from previous exam.  Plan: Follow-up in 6 months with Carotid Duplex scan.   I discussed in depth with the patient the nature of atherosclerosis, and emphasized the importance of maximal medical management including strict control of blood pressure, blood glucose, and lipid levels, obtaining regular exercise, and cessation of smoking.  The patient is aware that without maximal medical management the underlying atherosclerotic disease process will progress, limiting the benefit of any interventions. The patient was given information about stroke prevention and what symptoms should prompt the patient to seek immediate medical care. Thank you for allowing [place] to participate in this patient's care.  Charisse March, RN, MSN, FNP-C [facility] Office: [phone]  Clinic Physician: Edilia Bo  03/20/2013 11:19 AM

## 2013-03-20 NOTE — Patient Instructions (Signed)
Stroke Prevention Some medical conditions and behaviors are associated with an increased chance of having a stroke. You may prevent a stroke by making healthy choices and managing medical conditions. HOW CAN I REDUCE MY RISK OF HAVING A STROKE?   Stay physically active. Get at least 30 minutes of activity on most or all days.  Do not smoke. It may also be helpful to avoid exposure to secondhand smoke.  Limit alcohol use. Moderate alcohol use is considered to be:  No more than 2 drinks per day for men.  No more than 1 drink per day for nonpregnant women.  Eat healthy foods. This involves  Eating 5 or more servings of fruits and vegetables a day.  Following a diet that addresses high blood pressure (hypertension), high cholesterol, diabetes, or obesity.  Manage your cholesterol levels.  A diet low in saturated fat, trans fat, and cholesterol and high in fiber may control cholesterol levels.  Take any prescribed medicines to control cholesterol as directed by your health care provider.  Manage your diabetes.  A controlled-carbohydrate, controlled-sugar diet is recommended to manage diabetes.  Take any prescribed medicines to control diabetes as directed by your health care provider.  Control your hypertension.  A low-salt (sodium), low-saturated fat, low-trans fat, and low-cholesterol diet is recommended to manage hypertension.  Take any prescribed medicines to control hypertension as directed by your health care provider.  Maintain a healthy weight.  A reduced-calorie, low-sodium, low-saturated fat, low-trans fat, low-cholesterol diet is recommended to manage weight.  Stop drug abuse.  Avoid taking birth control pills.  Talk to your health care provider about the risks of taking birth control pills if you are over 35 years old, smoke, get migraines, or have ever had a blood clot.  Get evaluated for sleep disorders (sleep apnea).  Talk to your health care provider about  getting a sleep evaluation if you snore a lot or have excessive sleepiness.  Take medicines as directed by your health care provider.  For some people, aspirin or blood thinners (anticoagulants) are helpful in reducing the risk of forming abnormal blood clots that can lead to stroke. If you have the irregular heart rhythm of atrial fibrillation, you should be on a blood thinner unless there is a good reason you cannot take them.  Understand all your medicine instructions.  Make sure that other other conditions (such as anemia or atherosclerosis) are addressed. SEEK IMMEDIATE MEDICAL CARE IF:   You have sudden weakness or numbness of the face, arm, or leg, especially on one side of the body.  Your face or eyelid droops to one side.  You have sudden confusion.  You have trouble speaking (aphasia) or understanding.  You have sudden trouble seeing in one or both eyes.  You have sudden trouble walking.  You have dizziness.  You have a loss of balance or coordination.  You have a sudden, severe headache with no known cause.  You have new chest pain or an irregular heartbeat. Any of these symptoms may represent a serious problem that is an emergency. Do not wait to see if the symptoms will go away. Get medical help at once. Call your local emergency services  (911 in [place] Do not drive yourself to the hospital. Document Released: 03/17/2004 Document Revised: 11/28/2012 Document Reviewed: 08/10/2012 [employer] Patient Information 2014 [employer].   Smoking Cessation Quitting smoking is important to your health and has many advantages. However, it is not always easy to quit since nicotine is a   very addictive drug. Often times, people try 3 times or more before being able to quit. This document explains the best ways for you to prepare to quit smoking. Quitting takes hard work and a lot of effort, but you can do it. ADVANTAGES OF QUITTING SMOKING  You will live longer, feel better,  and live better.  Your body will feel the impact of quitting smoking almost immediately.  Within 20 minutes, blood pressure decreases. Your pulse returns to its normal level.  After 8 hours, carbon monoxide levels in the blood return to normal. Your oxygen level increases.  After 24 hours, the chance of having a heart attack starts to decrease. Your breath, hair, and body stop smelling like smoke.  After 48 hours, damaged nerve endings begin to recover. Your sense of taste and smell improve.  After 72 hours, the body is virtually free of nicotine. Your bronchial tubes relax and breathing becomes easier.  After 2 to 12 weeks, lungs can hold more air. Exercise becomes easier and circulation improves.  The risk of having a heart attack, stroke, cancer, or lung disease is greatly reduced.  After 1 year, the risk of coronary heart disease is cut in half.  After 5 years, the risk of stroke falls to the same as a nonsmoker.  After 10 years, the risk of lung cancer is cut in half and the risk of other cancers decreases significantly.  After 15 years, the risk of coronary heart disease drops, usually to the level of a nonsmoker.  If you are pregnant, quitting smoking will improve your chances of having a healthy baby.  The people you live with, especially any children, will be healthier.  You will have extra money to spend on things other than cigarettes. QUESTIONS TO THINK ABOUT BEFORE ATTEMPTING TO QUIT You may want to talk about your answers with your caregiver.  Why do you want to quit?  If you tried to quit in the past, what helped and what did not?  What will be the most difficult situations for you after you quit? How will you plan to handle them?  Who can help you through the tough times? Your family? Friends? A caregiver?  What pleasures do you get from smoking? What ways can you still get pleasure if you quit? Here are some questions to ask your caregiver:  How can you  help me to be successful at quitting?  What medicine do you think would be best for me and how should I take it?  What should I do if I need more help?  What is smoking withdrawal like? How can I get information on withdrawal? GET READY  Set a quit date.  Change your environment by getting rid of all cigarettes, ashtrays, matches, and lighters in your home, car, or work. Do not let people smoke in your home.  Review your past attempts to quit. Think about what worked and what did not. GET SUPPORT AND ENCOURAGEMENT You have a better chance of being successful if you have help. You can get support in many ways.  Tell your family, friends, and co-workers that you are going to quit and need their support. Ask them not to smoke around you.  Get individual, group, or telephone counseling and support. Programs are available at local hospitals and health centers. Call your local health department for information about programs in your area.  Spiritual beliefs and practices may help some smokers quit.  Download a "quit meter" on your computer   to keep track of quit statistics, such as how long you have gone without smoking, cigarettes not smoked, and money saved.  Get a self-help book about quitting smoking and staying off of tobacco. LEARN NEW SKILLS AND BEHAVIORS  Distract yourself from urges to smoke. Talk to someone, go for a walk, or occupy your time with a task.  Change your normal routine. Take a different route to work. Drink tea instead of coffee. Eat breakfast in a different place.  Reduce your stress. Take a hot bath, exercise, or read a book.  Plan something enjoyable to do every day. Reward yourself for not smoking.  Explore interactive web-based programs that specialize in helping you quit. GET MEDICINE AND USE IT CORRECTLY Medicines can help you stop smoking and decrease the urge to smoke. Combining medicine with the above behavioral methods and support can greatly increase  your chances of successfully quitting smoking.  Nicotine replacement therapy helps deliver nicotine to your body without the negative effects and risks of smoking. Nicotine replacement therapy includes nicotine gum, lozenges, inhalers, nasal sprays, and skin patches. Some may be available over-the-counter and others require a prescription.  Antidepressant medicine helps people abstain from smoking, but how this works is unknown. This medicine is available by prescription.  Nicotinic receptor partial agonist medicine simulates the effect of nicotine in your brain. This medicine is available by prescription. Ask your caregiver for advice about which medicines to use and how to use them based on your health history. Your caregiver will tell you what side effects to look out for if you choose to be on a medicine or therapy. Carefully read the information on the package. Do not use any other product containing nicotine while using a nicotine replacement product.  RELAPSE OR DIFFICULT SITUATIONS Most relapses occur within the first 3 months after quitting. Do not be discouraged if you start smoking again. Remember, most people try several times before finally quitting. You may have symptoms of withdrawal because your body is used to nicotine. You may crave cigarettes, be irritable, feel very hungry, cough often, get headaches, or have difficulty concentrating. The withdrawal symptoms are only temporary. They are strongest when you first quit, but they will go away within 10 14 days. To reduce the chances of relapse, try to:  Avoid drinking alcohol. Drinking lowers your chances of successfully quitting.  Reduce the amount of caffeine you consume. Once you quit smoking, the amount of caffeine in your body increases and can give you symptoms, such as a rapid heartbeat, sweating, and anxiety.  Avoid smokers because they can make you want to smoke.  Do not let weight gain distract you. Many smokers will gain  weight when they quit, usually less than 10 pounds. Eat a healthy diet and stay active. You can always lose the weight gained after you quit.  Find ways to improve your mood other than smoking. FOR MORE INFORMATION  www.smokefree.gov  Document Released: 02/01/2001 Document Revised: 08/09/2011 Document Reviewed: 05/19/2011 [employer] Patient Information 2014 [employer].  

## 2013-09-20 ENCOUNTER — Encounter: Payer: Self-pay | Admitting: Family

## 2013-09-23 ENCOUNTER — Encounter: Payer: Self-pay | Admitting: Family

## 2013-09-23 ENCOUNTER — Ambulatory Visit (INDEPENDENT_AMBULATORY_CARE_PROVIDER_SITE_OTHER): Payer: 59 | Admitting: Family

## 2013-09-23 ENCOUNTER — Ambulatory Visit (HOSPITAL_COMMUNITY)
Admission: RE | Admit: 2013-09-23 | Discharge: 2013-09-23 | Disposition: A | Discharge status: Home / Self Care | Payer: 59 | Source: Ambulatory Visit | Attending: Family | Admitting: Family

## 2013-09-23 VITALS — BP 133/86 | HR 84 | Resp 16 | Ht 70.0 in | Wt 165.0 lb

## 2013-09-23 DIAGNOSIS — I6529 Occlusion and stenosis of unspecified carotid artery: Secondary | ICD-10-CM

## 2013-09-23 DIAGNOSIS — R0602 Shortness of breath: Secondary | ICD-10-CM

## 2013-09-23 NOTE — Addendum Note (Signed)
Addended by: Sharee PimpleMCCHESNEY, MARILYN K on: 09/23/2013 04:31 PM   Modules accepted: Orders

## 2013-09-23 NOTE — Progress Notes (Signed)
Established Carotid Patient   History of Present Illness  Susan Todd is a 48 y.o. female patient of Dr. Myra Gianotti who has known carotid stenosis.  She returns today for routine surveillance.  Pt states 2014 was a tumultuous year and has trouble quitting smoking.  Patient has Negative history of TIA or stroke symptom. The patient denies amaurosis fugax or monocular blindness. The patient denies facial drooping.  Pt. denies hemiplegia. The patient denies receptive or expressive aphasia. Pt. denies extremity weakness.  Has been taking a statin since her last visit.  Pt denies claudication symptoms, the folliculitis on left buttocks has resolved. Pt reports New Medical or Surgical History: started on antihypertensive medication.   Pt Diabetic: No  Pt smoker: smoker (1 ppd x 30 yrs)   Pt meds include:  Statin : Yes  ASA: Yes  Other anticoagulants/antiplatelets: no    Past Medical History  Diagnosis Date  . Depression   . Anxiety   . Arthritis   . Carotid artery occlusion   . COPD (chronic obstructive pulmonary disease)     Emphysema    Social History History  Substance Use Topics  . Smoking status: Current Every Day Smoker -- 1.00 packs/day for 30 years    Types: Cigarettes  . Smokeless tobacco: Never Used     Comment: pt states that she knows she needs to quit but has been stressed and uses cigs as a resort  . Alcohol Use: No    Family History Family History  Problem Relation Age of Onset  . Heart disease Mother   . Hyperlipidemia Mother   . Dementia Mother   . Hypertension Mother   . Cancer Father     MELANOMA  . Cancer Brother     MELANOMA  . Hyperlipidemia Brother   . Heart disease Maternal Aunt     Surgical History Past Surgical History  Procedure Laterality Date  . Bunionectomy      BOTH FEET    Allergies  Allergen Reactions  . Codeine Nausea Only    EXTREME  . Latex Hives    Current Outpatient Prescriptions  Medication Sig Dispense Refill  .  acetaminophen (TYLENOL) 325 MG tablet Take 650 mg by mouth every 6 (six) hours as needed.      Marland Kitchen aspirin 81 MG tablet Take 81 mg by mouth daily.      Marland Kitchen atorvastatin (LIPITOR) 10 MG tablet Take 10 mg by mouth daily.      Marland Kitchen augmented betamethasone dipropionate (DIPROLENE-AF) 0.05 % cream apply to affected area twice daily.  50 g  1  . citalopram (CELEXA) 40 MG tablet Take 40 mg by mouth daily.        Marland Kitchen LORazepam (ATIVAN) 1 MG tablet Take 1 mg by mouth every 8 (eight) hours as needed. For anxiety      . mupirocin ointment (BACTROBAN) 2 % Apply topically 2 (two) times daily.      . Oxcarbazepine (TRILEPTAL) 300 MG tablet Take by mouth at bedtime as needed.      . terbinafine (LAMISIL) 250 MG tablet Take 250 mg by mouth daily.      . urea (CARMOL) 40 % CREA Apply topically 2 (two) times daily.       No current facility-administered medications for this visit.    Review of Systems : See HPI for pertinent positives and negatives.  Physical Examination  Filed Vitals:   09/23/13 0936 09/23/13 0938  BP: 126/81 133/86  Pulse: 81 84  Resp:  16  Height:  5\' 10"  (1.778 m)  Weight:  165 lb (74.844 kg)  SpO2:  100%   Body mass index is 23.68 kg/(m^2).  General: WDWN female in NAD  GAIT: normal  Eyes: PERRLA  Pulmonary: CTAB, Negative Rales, Negative rhonchi, & Negative wheezing.  Cardiac: regular Rhythm , No detected Murmur.   VASCULAR EXAM  Carotid Bruits  Left  Right    Negative  Negative    Radial pulses are 2+ palpable and equal.   LE Pulses  LEFT  RIGHT   POPLITEAL  Not palpable  Not palpable   POSTERIOR TIBIAL  2+palpable  2+palpable   DORSALIS PEDIS  ANTERIOR TIBIAL  not palpable  not palpable    Gastrointestinal: soft, nontender, BS WNL, no r/g, negative masses.  Musculoskeletal: Negative muscle atrophy/wasting. M/S 5/5 throughout, Extremities without ischemic changes.  Neurologic: A&O X 3; Appropriate Affect ; SENSATION ;normal;  Speech is normal  CN 2-12 intact, Pain and  light touch intact in extremities, Motor exam as listed above.   Non-Invasive Vascular Imaging CAROTID DUPLEX 09/23/2013   CEREBROVASCULAR DUPLEX EVALUATION    INDICATION: Carotid artery disease     PREVIOUS INTERVENTION(S):     DUPLEX EXAM:     RIGHT  LEFT  Peak Systolic Velocities (cm/s) End Diastolic Velocities (cm/s) Plaque LOCATION Peak Systolic Velocities (cm/s) End Diastolic Velocities (cm/s) Plaque  80 24  CCA PROXIMAL 79 23   56 18  CCA MID 66 22   51 17  CCA DISTAL 69 28   87 27  ECA 79 20   121 48 HT ICA PROXIMAL 58 18 HT  96 35  ICA MID 65 27   76 30  ICA DISTAL 89 39     2.16 ICA / CCA Ratio (PSV) 1.34  Antegrade  Vertebral Flow Antegrade   120 Brachial Systolic Pressure (mmHg) 120  Triphasic  Brachial Artery Waveforms Triphasic     Plaque Morphology:  HM = Homogeneous, HT = Heterogeneous, CP = Calcific Plaque, SP = Smooth Plaque, IP = Irregular Plaque     ADDITIONAL FINDINGS:     IMPRESSION: Right internal carotid artery velocities suggest a 40-59% stenosis.  Left internal carotid artery velocities suggest a <40% stenosis.     Compared to the previous exam:  No significant change in velocities in comparison to the last exam on 03/20/2013.      Assessment: Susan Todd is a 48 y.o. female who presents with asymptomatic 40 - 59 % right ICA stenosis and minimal left ICA stenosis. No significant change in velocities in comparison to the last exam on 03/20/2013.   Plan: Follow-up in 6 months with Carotid Duplex scan.  She was again counseled re smoking cessation.  I discussed in depth with the patient the nature of atherosclerosis, and emphasized the importance of maximal medical management including strict control of blood pressure, blood glucose, and lipid levels, obtaining regular exercise, and cessation of smoking.  The patient is aware that without maximal medical management the underlying atherosclerotic disease process will progress, limiting the benefit  of any interventions. The patient was given information about stroke prevention and what symptoms should prompt the patient to seek immediate medical care. Thank you for allowing us to participate in this patient's care.  Charisse MarchSuzanne Udell Mazzocco, RN, MSN, FNP-C [facility] [place] Office: [phone]  Clinic Physician: Myra GianottiBrabham  09/23/2013 9:27 AM

## 2013-09-23 NOTE — Patient Instructions (Signed)
Stroke Prevention Some medical conditions and behaviors are associated with an increased chance of having a stroke. You may prevent a stroke by making healthy choices and managing medical conditions. HOW CAN I REDUCE MY RISK OF HAVING A STROKE?   Stay physically active. Get at least 30 minutes of activity on most or all days.  Do not smoke. It may also be helpful to avoid exposure to secondhand smoke.  Limit alcohol use. Moderate alcohol use is considered to be:  No more than 2 drinks per day for men.  No more than 1 drink per day for nonpregnant women.  Eat healthy foods. This involves:  Eating 5 or more servings of fruits and vegetables a day.  Making dietary changes that address high blood pressure (hypertension), high cholesterol, diabetes, or obesity.  Manage your cholesterol levels.  Making food choices that are high in fiber and low in saturated fat, trans fat, and cholesterol may control cholesterol levels.  Take any prescribed medicines to control cholesterol as directed by your health care provider.  Manage your diabetes.  Controlling your carbohydrate and sugar intake is recommended to manage diabetes.  Take any prescribed medicines to control diabetes as directed by your health care provider.  Control your hypertension.  Making food choices that are low in salt (sodium), saturated fat, trans fat, and cholesterol is recommended to manage hypertension.  Take any prescribed medicines to control hypertension as directed by your health care provider.  Maintain a healthy weight.  Reducing calorie intake and making food choices that are low in sodium, saturated fat, trans fat, and cholesterol are recommended to manage weight.  Stop drug abuse.  Avoid taking birth control pills.  Talk to your health care provider about the risks of taking birth control pills if you are over 35 years old, smoke, get migraines, or have ever had a blood clot.  Get evaluated for sleep  disorders (sleep apnea).  Talk to your health care provider about getting a sleep evaluation if you snore a lot or have excessive sleepiness.  Take medicines only as directed by your health care provider.  For some people, aspirin or blood thinners (anticoagulants) are helpful in reducing the risk of forming abnormal blood clots that can lead to stroke. If you have the irregular heart rhythm of atrial fibrillation, you should be on a blood thinner unless there is a good reason you cannot take them.  Understand all your medicine instructions.  Make sure that other conditions (such as anemia or atherosclerosis) are addressed. SEEK IMMEDIATE MEDICAL CARE IF:   You have sudden weakness or numbness of the face, arm, or leg, especially on one side of the body.  Your face or eyelid droops to one side.  You have sudden confusion.  You have trouble speaking (aphasia) or understanding.  You have sudden trouble seeing in one or both eyes.  You have sudden trouble walking.  You have dizziness.  You have a loss of balance or coordination.  You have a sudden, severe headache with no known cause.  You have new chest pain or an irregular heartbeat. Any of these symptoms may represent a serious problem that is an emergency. Do not wait to see if the symptoms will go away. Get medical help at once. Call your local emergency services (911 in [place] Do not drive yourself to the hospital. Document Released: 03/17/2004 Document Revised: 06/24/2013 Document Reviewed: 08/10/2012 [employer] Patient Information 2015 [employer]. This information is not intended to replace advice given   to you by your health care provider. Make sure you discuss any questions you have with your health care provider.   Smoking Cessation Quitting smoking is important to your health and has many advantages. However, it is not always easy to quit since nicotine is a very addictive drug. Oftentimes, people try 3 times or more  before being able to quit. This document explains the best ways for you to prepare to quit smoking. Quitting takes hard work and a lot of effort, but you can do it. ADVANTAGES OF QUITTING SMOKING  You will live longer, feel better, and live better.  Your body will feel the impact of quitting smoking almost immediately.  Within 20 minutes, blood pressure decreases. Your pulse returns to its normal level.  After 8 hours, carbon monoxide levels in the blood return to normal. Your oxygen level increases.  After 24 hours, the chance of having a heart attack starts to decrease. Your breath, hair, and body stop smelling like smoke.  After 48 hours, damaged nerve endings begin to recover. Your sense of taste and smell improve.  After 72 hours, the body is virtually free of nicotine. Your bronchial tubes relax and breathing becomes easier.  After 2 to 12 weeks, lungs can hold more air. Exercise becomes easier and circulation improves.  The risk of having a heart attack, stroke, cancer, or lung disease is greatly reduced.  After 1 year, the risk of coronary heart disease is cut in half.  After 5 years, the risk of stroke falls to the same as a nonsmoker.  After 10 years, the risk of lung cancer is cut in half and the risk of other cancers decreases significantly.  After 15 years, the risk of coronary heart disease drops, usually to the level of a nonsmoker.  If you are pregnant, quitting smoking will improve your chances of having a healthy baby.  The people you live with, especially any children, will be healthier.  You will have extra money to spend on things other than cigarettes. QUESTIONS TO THINK ABOUT BEFORE ATTEMPTING TO QUIT You may want to talk about your answers with your health care provider.  Why do you want to quit?  If you tried to quit in the past, what helped and what did not?  What will be the most difficult situations for you after you quit? How will you plan to  handle them?  Who can help you through the tough times? Your family? Friends? A health care provider?  What pleasures do you get from smoking? What ways can you still get pleasure if you quit? Here are some questions to ask your health care provider:  How can you help me to be successful at quitting?  What medicine do you think would be best for me and how should I take it?  What should I do if I need more help?  What is smoking withdrawal like? How can I get information on withdrawal? GET READY  Set a quit date.  Change your environment by getting rid of all cigarettes, ashtrays, matches, and lighters in your home, car, or work. Do not let people smoke in your home.  Review your past attempts to quit. Think about what worked and what did not. GET SUPPORT AND ENCOURAGEMENT You have a better chance of being successful if you have help. You can get support in many ways.  Tell your family, friends, and coworkers that you are going to quit and need their support. Ask them not   to smoke around you.  Get individual, group, or telephone counseling and support. Programs are available at local hospitals and health centers. Call your local health department for information about programs in your area.  Spiritual beliefs and practices may help some smokers quit.  Download a "quit meter" on your computer to keep track of quit statistics, such as how long you have gone without smoking, cigarettes not smoked, and money saved.  Get a self-help book about quitting smoking and staying off tobacco. LEARN NEW SKILLS AND BEHAVIORS  Distract yourself from urges to smoke. Talk to someone, go for a walk, or occupy your time with a task.  Change your normal routine. Take a different route to work. Drink tea instead of coffee. Eat breakfast in a different place.  Reduce your stress. Take a hot bath, exercise, or read a book.  Plan something enjoyable to do every day. Reward yourself for not  smoking.  Explore interactive web-based programs that specialize in helping you quit. GET MEDICINE AND USE IT CORRECTLY Medicines can help you stop smoking and decrease the urge to smoke. Combining medicine with the above behavioral methods and support can greatly increase your chances of successfully quitting smoking.  Nicotine replacement therapy helps deliver nicotine to your body without the negative effects and risks of smoking. Nicotine replacement therapy includes nicotine gum, lozenges, inhalers, nasal sprays, and skin patches. Some may be available over-the-counter and others require a prescription.  Antidepressant medicine helps people abstain from smoking, but how this works is unknown. This medicine is available by prescription.  Nicotinic receptor partial agonist medicine simulates the effect of nicotine in your brain. This medicine is available by prescription. Ask your health care provider for advice about which medicines to use and how to use them based on your health history. Your health care provider will tell you what side effects to look out for if you choose to be on a medicine or therapy. Carefully read the information on the package. Do not use any other product containing nicotine while using a nicotine replacement product.  RELAPSE OR DIFFICULT SITUATIONS Most relapses occur within the first 3 months after quitting. Do not be discouraged if you start smoking again. Remember, most people try several times before finally quitting. You may have symptoms of withdrawal because your body is used to nicotine. You may crave cigarettes, be irritable, feel very hungry, cough often, get headaches, or have difficulty concentrating. The withdrawal symptoms are only temporary. They are strongest when you first quit, but they will go away within 10-14 days. To reduce the chances of relapse, try to:  Avoid drinking alcohol. Drinking lowers your chances of successfully quitting.  Reduce the  amount of caffeine you consume. Once you quit smoking, the amount of caffeine in your body increases and can give you symptoms, such as a rapid heartbeat, sweating, and anxiety.  Avoid smokers because they can make you want to smoke.  Do not let weight gain distract you. Many smokers will gain weight when they quit, usually less than 10 pounds. Eat a healthy diet and stay active. You can always lose the weight gained after you quit.  Find ways to improve your mood other than smoking. FOR MORE INFORMATION  www.smokefree.gov  Document Released: 02/01/2001 Document Revised: 06/24/2013 Document Reviewed: 05/19/2011 [employer] Patient Information 2015 [employer]. This information is not intended to replace advice given to you by your health care provider. Make sure you discuss any questions you have with your health care   provider.  

## 2013-12-19 ENCOUNTER — Other Ambulatory Visit: Payer: Self-pay | Admitting: Gastroenterology

## 2013-12-23 ENCOUNTER — Encounter: Payer: Self-pay | Admitting: Family

## 2014-03-04 ENCOUNTER — Other Ambulatory Visit: Payer: Self-pay | Admitting: Gastroenterology

## 2014-03-07 ENCOUNTER — Encounter (HOSPITAL_COMMUNITY): Payer: Self-pay | Admitting: *Deleted

## 2014-03-18 ENCOUNTER — Ambulatory Visit (HOSPITAL_COMMUNITY): Admission: RE | Admit: 2014-03-18 | Payer: 59 | Source: Ambulatory Visit | Admitting: Gastroenterology

## 2014-03-18 HISTORY — DX: Essential (primary) hypertension: I10

## 2014-03-18 SURGERY — COLONOSCOPY WITH PROPOFOL
Anesthesia: Monitor Anesthesia Care

## 2014-03-20 ENCOUNTER — Other Ambulatory Visit: Payer: Self-pay | Admitting: Gastroenterology

## 2014-03-25 ENCOUNTER — Encounter: Payer: Self-pay | Admitting: Family

## 2014-03-26 ENCOUNTER — Ambulatory Visit (HOSPITAL_COMMUNITY)
Admission: RE | Admit: 2014-03-26 | Discharge: 2014-03-26 | Disposition: A | Discharge status: Home / Self Care | Payer: 59 | Source: Ambulatory Visit | Attending: Family | Admitting: Family

## 2014-03-26 ENCOUNTER — Ambulatory Visit (INDEPENDENT_AMBULATORY_CARE_PROVIDER_SITE_OTHER): Payer: 59 | Admitting: Family

## 2014-03-26 ENCOUNTER — Encounter: Payer: Self-pay | Admitting: Family

## 2014-03-26 VITALS — BP 136/90 | HR 98 | Resp 16 | Ht 70.0 in | Wt 178.0 lb

## 2014-03-26 DIAGNOSIS — I6523 Occlusion and stenosis of bilateral carotid arteries: Secondary | ICD-10-CM

## 2014-03-26 DIAGNOSIS — F17200 Nicotine dependence, unspecified, uncomplicated: Secondary | ICD-10-CM | POA: Diagnosis not present

## 2014-03-26 DIAGNOSIS — Z720 Tobacco use: Secondary | ICD-10-CM

## 2014-03-26 NOTE — Patient Instructions (Signed)
Stroke Prevention Some medical conditions and behaviors are associated with an increased chance of having a stroke. You may prevent a stroke by making healthy choices and managing medical conditions. HOW CAN I REDUCE MY RISK OF HAVING A STROKE?   Stay physically active. Get at least 30 minutes of activity on most or all days.  Do not smoke. It may also be helpful to avoid exposure to secondhand smoke.  Limit alcohol use. Moderate alcohol use is considered to be:  No more than 2 drinks per day for men.  No more than 1 drink per day for nonpregnant women.  Eat healthy foods. This involves:  Eating 5 or more servings of fruits and vegetables a day.  Making dietary changes that address high blood pressure (hypertension), high cholesterol, diabetes, or obesity.  Manage your cholesterol levels.  Making food choices that are high in fiber and low in saturated fat, trans fat, and cholesterol may control cholesterol levels.  Take any prescribed medicines to control cholesterol as directed by your health care provider.  Manage your diabetes.  Controlling your carbohydrate and sugar intake is recommended to manage diabetes.  Take any prescribed medicines to control diabetes as directed by your health care provider.  Control your hypertension.  Making food choices that are low in salt (sodium), saturated fat, trans fat, and cholesterol is recommended to manage hypertension.  Take any prescribed medicines to control hypertension as directed by your health care provider.  Maintain a healthy weight.  Reducing calorie intake and making food choices that are low in sodium, saturated fat, trans fat, and cholesterol are recommended to manage weight.  Stop drug abuse.  Avoid taking birth control pills.  Talk to your health care provider about the risks of taking birth control pills if you are over 35 years old, smoke, get migraines, or have ever had a blood clot.  Get evaluated for sleep  disorders (sleep apnea).  Talk to your health care provider about getting a sleep evaluation if you snore a lot or have excessive sleepiness.  Take medicines only as directed by your health care provider.  For some people, aspirin or blood thinners (anticoagulants) are helpful in reducing the risk of forming abnormal blood clots that can lead to stroke. If you have the irregular heart rhythm of atrial fibrillation, you should be on a blood thinner unless there is a good reason you cannot take them.  Understand all your medicine instructions.  Make sure that other conditions (such as anemia or atherosclerosis) are addressed. SEEK IMMEDIATE MEDICAL CARE IF:   You have sudden weakness or numbness of the face, arm, or leg, especially on one side of the body.  Your face or eyelid droops to one side.  You have sudden confusion.  You have trouble speaking (aphasia) or understanding.  You have sudden trouble seeing in one or both eyes.  You have sudden trouble walking.  You have dizziness.  You have a loss of balance or coordination.  You have a sudden, severe headache with no known cause.  You have new chest pain or an irregular heartbeat. Any of these symptoms may represent a serious problem that is an emergency. Do not wait to see if the symptoms will go away. Get medical help at once. Call your local emergency services (911 in [place] Do not drive yourself to the hospital. Document Released: 03/17/2004 Document Revised: 06/24/2013 Document Reviewed: 08/10/2012 Exit[employer] Patient Information 2015 [employer]. This information is not intended to replace advice given   to you by your health care provider. Make sure you discuss any questions you have with your health care provider.    Smoking Cessation Quitting smoking is important to your health and has many advantages. However, it is not always easy to quit since nicotine is a very addictive drug. Oftentimes, people try 3 times or  more before being able to quit. This document explains the best ways for you to prepare to quit smoking. Quitting takes hard work and a lot of effort, but you can do it. ADVANTAGES OF QUITTING SMOKING  You will live longer, feel better, and live better.  Your body will feel the impact of quitting smoking almost immediately.  Within 20 minutes, blood pressure decreases. Your pulse returns to its normal level.  After 8 hours, carbon monoxide levels in the blood return to normal. Your oxygen level increases.  After 24 hours, the chance of having a heart attack starts to decrease. Your breath, hair, and body stop smelling like smoke.  After 48 hours, damaged nerve endings begin to recover. Your sense of taste and smell improve.  After 72 hours, the body is virtually free of nicotine. Your bronchial tubes relax and breathing becomes easier.  After 2 to 12 weeks, lungs can hold more air. Exercise becomes easier and circulation improves.  The risk of having a heart attack, stroke, cancer, or lung disease is greatly reduced.  After 1 year, the risk of coronary heart disease is cut in half.  After 5 years, the risk of stroke falls to the same as a nonsmoker.  After 10 years, the risk of lung cancer is cut in half and the risk of other cancers decreases significantly.  After 15 years, the risk of coronary heart disease drops, usually to the level of a nonsmoker.  If you are pregnant, quitting smoking will improve your chances of having a healthy baby.  The people you live with, especially any children, will be healthier.  You will have extra money to spend on things other than cigarettes. QUESTIONS TO THINK ABOUT BEFORE ATTEMPTING TO QUIT You may want to talk about your answers with your health care provider.  Why do you want to quit?  If you tried to quit in the past, what helped and what did not?  What will be the most difficult situations for you after you quit? How will you plan to  handle them?  Who can help you through the tough times? Your family? Friends? A health care provider?  What pleasures do you get from smoking? What ways can you still get pleasure if you quit? Here are some questions to ask your health care provider:  How can you help me to be successful at quitting?  What medicine do you think would be best for me and how should I take it?  What should I do if I need more help?  What is smoking withdrawal like? How can I get information on withdrawal? GET READY  Set a quit date.  Change your environment by getting rid of all cigarettes, ashtrays, matches, and lighters in your home, car, or work. Do not let people smoke in your home.  Review your past attempts to quit. Think about what worked and what did not. GET SUPPORT AND ENCOURAGEMENT You have a better chance of being successful if you have help. You can get support in many ways.  Tell your family, friends, and coworkers that you are going to quit and need their support. Ask them   not to smoke around you.  Get individual, group, or telephone counseling and support. Programs are available at local hospitals and health centers. Call your local health department for information about programs in your area.  Spiritual beliefs and practices may help some smokers quit.  Download a "quit meter" on your computer to keep track of quit statistics, such as how long you have gone without smoking, cigarettes not smoked, and money saved.  Get a self-help book about quitting smoking and staying off tobacco. LEARN NEW SKILLS AND BEHAVIORS  Distract yourself from urges to smoke. Talk to someone, go for a walk, or occupy your time with a task.  Change your normal routine. Take a different route to work. Drink tea instead of coffee. Eat breakfast in a different place.  Reduce your stress. Take a hot bath, exercise, or read a book.  Plan something enjoyable to do every day. Reward yourself for not  smoking.  Explore interactive web-based programs that specialize in helping you quit. GET MEDICINE AND USE IT CORRECTLY Medicines can help you stop smoking and decrease the urge to smoke. Combining medicine with the above behavioral methods and support can greatly increase your chances of successfully quitting smoking.  Nicotine replacement therapy helps deliver nicotine to your body without the negative effects and risks of smoking. Nicotine replacement therapy includes nicotine gum, lozenges, inhalers, nasal sprays, and skin patches. Some may be available over-the-counter and others require a prescription.  Antidepressant medicine helps people abstain from smoking, but how this works is unknown. This medicine is available by prescription.  Nicotinic receptor partial agonist medicine simulates the effect of nicotine in your brain. This medicine is available by prescription. Ask your health care provider for advice about which medicines to use and how to use them based on your health history. Your health care provider will tell you what side effects to look out for if you choose to be on a medicine or therapy. Carefully read the information on the package. Do not use any other product containing nicotine while using a nicotine replacement product.  RELAPSE OR DIFFICULT SITUATIONS Most relapses occur within the first 3 months after quitting. Do not be discouraged if you start smoking again. Remember, most people try several times before finally quitting. You may have symptoms of withdrawal because your body is used to nicotine. You may crave cigarettes, be irritable, feel very hungry, cough often, get headaches, or have difficulty concentrating. The withdrawal symptoms are only temporary. They are strongest when you first quit, but they will go away within 10-14 days. To reduce the chances of relapse, try to:  Avoid drinking alcohol. Drinking lowers your chances of successfully quitting.  Reduce the  amount of caffeine you consume. Once you quit smoking, the amount of caffeine in your body increases and can give you symptoms, such as a rapid heartbeat, sweating, and anxiety.  Avoid smokers because they can make you want to smoke.  Do not let weight gain distract you. Many smokers will gain weight when they quit, usually less than 10 pounds. Eat a healthy diet and stay active. You can always lose the weight gained after you quit.  Find ways to improve your mood other than smoking. FOR MORE INFORMATION  www.smokefree.gov  Document Released: 02/01/2001 Document Revised: 06/24/2013 Document Reviewed: 05/19/2011 ExitCare Patient Information 2015 [employer]. This information is not intended to replace advice given to you by your health care provider. Make sure you discuss any questions you have with your health   care provider.    Smoking Cessation, Tips for Success If you are ready to quit smoking, congratulations! You have chosen to help yourself be healthier. Cigarettes bring nicotine, tar, carbon monoxide, and other irritants into your body. Your lungs, heart, and blood vessels will be able to work better without these poisons. There are many different ways to quit smoking. Nicotine gum, nicotine patches, a nicotine inhaler, or nicotine nasal spray can help with physical craving. Hypnosis, support groups, and medicines help break the habit of smoking. WHAT THINGS CAN I DO TO MAKE QUITTING EASIER?  Here are some tips to help you quit for good:  Pick a date when you will quit smoking completely. Tell all of your friends and family about your plan to quit on that date.  Do not try to slowly cut down on the number of cigarettes you are smoking. Pick a quit date and quit smoking completely starting on that day.  Throw away all cigarettes.   Clean and remove all ashtrays from your home, work, and car.  On a card, write down your reasons for quitting. Carry the card with you and read it  when you get the urge to smoke.  Cleanse your body of nicotine. Drink enough water and fluids to keep your urine clear or pale yellow. Do this after quitting to flush the nicotine from your body.  Learn to predict your moods. Do not let a bad situation be your excuse to have a cigarette. Some situations in your life might tempt you into wanting a cigarette.  Never have "just one" cigarette. It leads to wanting another and another. Remind yourself of your decision to quit.  Change habits associated with smoking. If you smoked while driving or when feeling stressed, try other activities to replace smoking. Stand up when drinking your coffee. Brush your teeth after eating. Sit in a different chair when you read the paper. Avoid alcohol while trying to quit, and try to drink fewer caffeinated beverages. Alcohol and caffeine may urge you to smoke.  Avoid foods and drinks that can trigger a desire to smoke, such as sugary or spicy foods and alcohol.  Ask people who smoke not to smoke around you.  Have something planned to do right after eating or having a cup of coffee. For example, plan to take a walk or exercise.  Try a relaxation exercise to calm you down and decrease your stress. Remember, you may be tense and nervous for the first 2 weeks after you quit, but this will pass.  Find new activities to keep your hands busy. Play with a pen, coin, or rubber band. Doodle or draw things on paper.  Brush your teeth right after eating. This will help cut down on the craving for the taste of tobacco after meals. You can also try mouthwash.   Use oral substitutes in place of cigarettes. Try using lemon drops, carrots, cinnamon sticks, or chewing gum. Keep them handy so they are available when you have the urge to smoke.  When you have the urge to smoke, try deep breathing.  Designate your home as a nonsmoking area.  If you are a heavy smoker, ask your health care provider about a prescription for  nicotine chewing gum. It can ease your withdrawal from nicotine.  Reward yourself. Set aside the cigarette money you save and buy yourself something nice.  Look for support from others. Join a support group or smoking cessation program. Ask someone at home or at work   to help you with your plan to quit smoking.  Always ask yourself, "Do I need this cigarette or is this just a reflex?" Tell yourself, "Today, I choose not to smoke," or "I do not want to smoke." You are reminding yourself of your decision to quit.  Do not replace cigarette smoking with electronic cigarettes (commonly called e-cigarettes). The safety of e-cigarettes is unknown, and some may contain harmful chemicals.  If you relapse, do not give up! Plan ahead and think about what you will do the next time you get the urge to smoke. HOW WILL I FEEL WHEN I QUIT SMOKING? You may have symptoms of withdrawal because your body is used to nicotine (the addictive substance in cigarettes). You may crave cigarettes, be irritable, feel very hungry, cough often, get headaches, or have difficulty concentrating. The withdrawal symptoms are only temporary. They are strongest when you first quit but will go away within 10-14 days. When withdrawal symptoms occur, stay in control. Think about your reasons for quitting. Remind yourself that these are signs that your body is healing and getting used to being without cigarettes. Remember that withdrawal symptoms are easier to treat than the major diseases that smoking can cause.  Even after the withdrawal is over, expect periodic urges to smoke. However, these cravings are generally short lived and will go away whether you smoke or not. Do not smoke! WHAT RESOURCES ARE AVAILABLE TO HELP ME QUIT SMOKING? Your health care provider can direct you to community resources or hospitals for support, which may include:  Group support.  Education.  Hypnosis.  Therapy. Document Released: 11/06/2003 Document  Revised: 06/24/2013 Document Reviewed: 07/26/2012 ExitCare Patient Information 2015 [employer]. This information is not intended to replace advice given to you by your health care provider. Make sure you discuss any questions you have with your health care provider.  

## 2014-03-26 NOTE — Progress Notes (Signed)
Established Carotid Patient   History of Present Illness  Susan Todd is a 49 y.o. female patient of Dr. Myra Gianotti who has known carotid stenosis.  She returns today for routine surveillance.  She again feels under stress, including financial, and has trouble quitting smoking. Pt denies any feelings of wanting to harm self. Patient has Negative history of TIA or stroke symptom. The patient denies amaurosis fugax or monocular blindness. The patient denies facial drooping.  Pt. denies hemiplegia. The patient denies receptive or expressive aphasia. Pt. denies extremity weakness.   Pt denies claudication symptoms, the folliculitis on left buttocks has resolved. Pt reports New Medical or Surgical History: started on antihypertensive medication.   Pt Diabetic: No  Pt smoker: smoker (1 ppd x 30 yrs)   Pt meds include:  Statin : Yes  ASA: Yes  Other anticoagulants/antiplatelets: no    Past Medical History  Diagnosis Date  . Depression   . Anxiety   . Arthritis   . Carotid artery occlusion   . COPD (chronic obstructive pulmonary disease)     Emphysema  . Atrial fibrillation     "pt. has no knowledge of this" as of 03-07-14  . Hypertension     Social History History  Substance Use Topics  . Smoking status: Current Every Day Smoker -- 1.00 packs/day for 30 years    Types: Cigarettes  . Smokeless tobacco: Never Used     Comment: pt states that she knows she needs to quit but has been stressed and uses cigs as a resort  . Alcohol Use: Yes     Comment: rare-social    Family History Family History  Problem Relation Age of Onset  . Heart disease Mother     NOT  before age 45  . Hyperlipidemia Mother   . Dementia Mother   . Hypertension Mother   . Cancer Father     MELANOMA  . Hyperlipidemia Father   . Hypertension Father   . Cancer Brother     MELANOMA  . Hyperlipidemia Brother   . Hypertension Brother   . Heart disease Brother     Arhymia  . Heart disease  Maternal Aunt     Surgical History Past Surgical History  Procedure Laterality Date  . Bunionectomy      BOTH FEET  . Cyst excision perineal      8'03- Dr. Elana Alm    Allergies  Allergen Reactions  . Codeine Nausea Only    EXTREME  . Latex Hives    Current Outpatient Prescriptions  Medication Sig Dispense Refill  . acetaminophen (TYLENOL) 325 MG tablet Take 650 mg by mouth every 6 (six) hours as needed.    Marland Kitchen acyclovir (ZOVIRAX) 400 MG tablet 2 (two) times daily.    Marland Kitchen aspirin 81 MG tablet Take 81 mg by mouth daily.    Marland Kitchen aspirin EC 81 MG tablet Take 81 mg by mouth daily.    Marland Kitchen atorvastatin (LIPITOR) 10 MG tablet Take 10 mg by mouth daily.    Marland Kitchen LORazepam (ATIVAN) 1 MG tablet Take 1 mg by mouth every 8 (eight) hours as needed. For anxiety    . Oxcarbazepine (TRILEPTAL) 300 MG tablet Take 300 mg by mouth at bedtime as needed (Sleep).     . valACYclovir (VALTREX) 500 MG tablet Take 500 mg by mouth 2 (two) times daily as needed (Outbreaks).     . valsartan-hydrochlorothiazide (DIOVAN-HCT) 80-12.5 MG per tablet Take 1 tablet by mouth every morning.    Marland Kitchen  venlafaxine XR (EFFEXOR-XR) 75 MG 24 hr capsule 2 capsules at bedtime.    Marland Kitchen augmented betamethasone dipropionate (DIPROLENE-AF) 0.05 % cream apply to affected area twice daily. (Patient not taking: Reported on 03/07/2014) 50 g 1  . rosuvastatin (CRESTOR) 5 MG tablet Take 5 mg by mouth daily.     No current facility-administered medications for this visit.    Review of Systems : See HPI for pertinent positives and negatives.  Physical Examination  Filed Vitals:   03/26/14 1005 03/26/14 1008  BP: 142/90 136/90  Pulse: 73 98  Resp:  16  Height:   (1.778 m)  Weight:  178 lb (80.74 kg)  SpO2:  98%   Body mass index is 25.54 kg/(m^2).  General: WDWN female in NAD  GAIT: normal  Eyes: PERRLA  Pulmonary: CTAB, Negative Rales, Negative rhonchi, & Negative wheezing.  Cardiac: regular Rhythm , No detected Murmur.    VASCULAR EXAM  Carotid Bruits  Left  Right    Negative  Negative   Aorta is not palpable Radial pulses are 2+ palpable and equal.   LE Pulses  LEFT  RIGHT   POPLITEAL  Not palpable  Not palpable   POSTERIOR TIBIAL  2+palpable  2+palpable   DORSALIS PEDIS  ANTERIOR TIBIAL  not palpable  not palpable    Gastrointestinal: soft, nontender, BS WNL, no r/g, no palpable masses.  Musculoskeletal: Negative muscle atrophy/wasting. M/S 5/5 throughout, Extremities without ischemic changes.  Neurologic: A&O X 3; Appropriate Affect ; SENSATION ;normal;  Speech is normal  CN 2-12 intact, Pain and light touch intact in extremities, Motor exam as listed above.   Non-Invasive Vascular Imaging CAROTID DUPLEX 03/26/2014   CEREBROVASCULAR DUPLEX EVALUATION    INDICATION: Carotid disease    PREVIOUS INTERVENTION(S):     DUPLEX EXAM:     RIGHT  LEFT  Peak Systolic Velocities (cm/s) End Diastolic Velocities (cm/s) Plaque LOCATION Peak Systolic Velocities (cm/s) End Diastolic Velocities (cm/s) Plaque  82 24  CCA PROXIMAL 88 25   62 20  CCA MID 82 28   52 16  CCA DISTAL 65 22   84 25  ECA 105 29   135 48 HM ICA PROXIMAL 58 20 HT  80 31  ICA MID 70 26   63 23  ICA DISTAL 77 32     2.6 ICA / CCA Ratio (PSV) 0.89  Antegrade Vertebral Flow Antegrade   Brachial Systolic Pressure (mmHg)   Multiphasic (subclavian artery) Brachial Artery Waveforms Multiphasic (subclavian artery)    Plaque Morphology:  HM = Homogeneous, HT = Heterogeneous, CP = Calcific Plaque, SP = Smooth Plaque, IP = Irregular Plaque     ADDITIONAL FINDINGS: . No significant stenosis of the bilateral external or common carotid arteries. . Increased velocity of the right proximal internal carotid artery appears to be mostly due to a change in vessel diameter from roughly 0.91cm to 0.47cm.    IMPRESSION: Doppler velocities suggest a 40-59% stenosis of the right proximal internal carotid artery and a less  than 40% stenosis of the left proximal internal carotid artery.    Compared to the previous exam:  No significant change noted when compared to the previous exam on 09/23/13.         Assessment: Susan Todd is a 49 y.o. female who presents with asymptomatic 40-59% stenosis of the right proximal internal carotid artery and a less than 40% stenosis of the left proximal internal carotid artery. No significant change noted when compared  to the previous exam on 09/23/13.   Plan:  The patient was counseled re smoking cessation and given several free resources re smoking cessation.  Follow-up in 1 year with Carotid Duplex.   I discussed in depth with the patient the nature of atherosclerosis, and emphasized the importance of maximal medical management including strict control of blood pressure, blood glucose, and lipid levels, obtaining regular exercise, and cessation of smoking.  The patient is aware that without maximal medical management the underlying atherosclerotic disease process will progress, limiting the benefit of any interventions. The patient was given information about stroke prevention and what symptoms should prompt the patient to seek immediate medical care. Thank you for allowing us to participate in this patient's care.  Charisse MarchSuzanne Dushaun Okey, RN, MSN, FNP-C [facility] Office: [phone]  Clinic Physician: Edilia BoDickson  03/26/2014  10:27 AM

## 2014-06-02 ENCOUNTER — Ambulatory Visit (INDEPENDENT_AMBULATORY_CARE_PROVIDER_SITE_OTHER): Payer: 59 | Admitting: Gynecology

## 2014-06-02 ENCOUNTER — Encounter: Payer: Self-pay | Admitting: Gynecology

## 2014-06-02 VITALS — BP 130/80

## 2014-06-02 DIAGNOSIS — N926 Irregular menstruation, unspecified: Secondary | ICD-10-CM | POA: Diagnosis not present

## 2014-06-02 DIAGNOSIS — N951 Menopausal and female climacteric states: Secondary | ICD-10-CM

## 2014-06-02 MED ORDER — CITALOPRAM HYDROBROMIDE 10 MG PO TABS: 10.0000 mg | ORAL_TABLET | Freq: Every day | ORAL | Status: DC

## 2014-06-02 NOTE — Progress Notes (Signed)
Susan Todd 11/01/1965 [id number]        49 y.o.  [id number] Presents noting that her menses have become less regular this year having 2 menses in the last 6 months. Also having worsening mood swings and memory issues. Not having significant hot flashes or night sweats. Her last 2 menses lasted 2 days with no intermenstrual bleeding.  Not currently sexually active. Not having issues with vaginal dryness. Has annual exam scheduled as she is overdue.  Past medical history,surgical history, problem list, medications, allergies, family history and social history were all reviewed and documented in the EPIC chart.  Directed ROS with pertinent positives and negatives documented in the history of present illness/assessment and plan.  Exam: Filed Vitals:   06/02/14 0929  BP: 130/80   General appearance:  Normal HEENT normal without carotid bruits  Assessment/Plan:  49 y.o. [id number] with perimenopausal symptoms to include emotional swings, difficulty dealing with stress and increasing forgetfulness. I did not do a full exam today as she has this scheduled. I reviewed options for perimenopausal symptoms to include prescription medications such as anxiolytic/antidepressant's such as Effexor as well as full HRT.  I reviewed the whole issue of HRT to include the WHI study with increased risk of stroke, heart attack, DVT and breast cancer. Various methods to include oral versus patch/vaginal ring discussed. Need for progesterone endometrial protection also reviewed. Intermittent progesterone withdrawal versus daily progesterone supplementation reviewed. Does have history of carotid stenosis that she's being followed for and appears to be stable as no intervention has been done they currently are doing annual ultrasounds to follow. Increased risk of thrombosis/stroke given this history also reviewed. Definite need to consider trans-dermal absorption to minimize this effect if considering HRT. Also currently on citalopram  20 mg daily. Had been on Effexor previously for anxiety/depression but switched to citalopram due to financial reasons. Options to trial increasing her citalopram dose to see if this doesn't help as her main complaints appear to be emotional and not hot flushes or night sweats. After a lengthy discussion we both agree to trial of 30 mg citalopram daily. Side effects/risks to include suicide ideation reviewed. Citalopram 10 mg tablet #30 with 3 refills provided that she'll use in addition to her 20 mg dose. She'll call me in several weeks and will see how she's doing. If she does consider HRT she will need a GYN exam before initiating.    Dara LordsFONTAINE,Alen Matheson P MD, 10:01 AM 06/02/2014

## 2014-06-02 NOTE — Patient Instructions (Signed)
Increase her citalopram to 30 mg total daily. Call me if your symptoms persist. Call me if you have any issues or questions. Follow up for your GYN annual exam as scheduled.

## 2014-06-16 ENCOUNTER — Encounter (HOSPITAL_COMMUNITY): Admission: RE | Payer: Self-pay | Source: Ambulatory Visit

## 2014-06-16 ENCOUNTER — Ambulatory Visit (HOSPITAL_COMMUNITY): Admission: RE | Admit: 2014-06-16 | Payer: 59 | Source: Ambulatory Visit | Admitting: Gastroenterology

## 2014-06-16 SURGERY — COLONOSCOPY WITH PROPOFOL
Anesthesia: Monitor Anesthesia Care

## 2014-09-07 ENCOUNTER — Other Ambulatory Visit: Payer: Self-pay | Admitting: Gynecology

## 2014-09-24 ENCOUNTER — Encounter: Payer: 59 | Admitting: Gynecology

## 2015-03-16 ENCOUNTER — Other Ambulatory Visit: Payer: Self-pay | Admitting: Internal Medicine

## 2015-03-16 DIAGNOSIS — Z1231 Encounter for screening mammogram for malignant neoplasm of breast: Secondary | ICD-10-CM

## 2015-03-23 ENCOUNTER — Encounter: Payer: Self-pay | Admitting: Family

## 2015-03-24 ENCOUNTER — Ambulatory Visit: Payer: 59

## 2015-03-30 ENCOUNTER — Encounter (HOSPITAL_COMMUNITY): Payer: 59

## 2015-03-30 ENCOUNTER — Ambulatory Visit: Payer: 59 | Admitting: Family

## 2015-04-03 ENCOUNTER — Ambulatory Visit
Admission: RE | Admit: 2015-04-03 | Discharge: 2015-04-03 | Disposition: A | Discharge status: Home / Self Care | Payer: 59 | Source: Ambulatory Visit | Attending: Internal Medicine | Admitting: Internal Medicine

## 2015-04-03 DIAGNOSIS — Z1231 Encounter for screening mammogram for malignant neoplasm of breast: Secondary | ICD-10-CM

## 2016-04-04 ENCOUNTER — Encounter (HOSPITAL_COMMUNITY): Payer: Self-pay | Admitting: Emergency Medicine

## 2016-04-04 ENCOUNTER — Emergency Department (HOSPITAL_COMMUNITY)
Admission: EM | Admit: 2016-04-04 | Discharge: 2016-04-04 | Disposition: A | Discharge status: Home / Self Care | Payer: Commercial Managed Care - HMO | Attending: Emergency Medicine | Admitting: Emergency Medicine

## 2016-04-04 DIAGNOSIS — J449 Chronic obstructive pulmonary disease, unspecified: Secondary | ICD-10-CM | POA: Diagnosis not present

## 2016-04-04 DIAGNOSIS — F329 Major depressive disorder, single episode, unspecified: Secondary | ICD-10-CM | POA: Insufficient documentation

## 2016-04-04 DIAGNOSIS — Z79899 Other long term (current) drug therapy: Secondary | ICD-10-CM | POA: Insufficient documentation

## 2016-04-04 DIAGNOSIS — Z91040 Latex allergy status: Secondary | ICD-10-CM | POA: Diagnosis not present

## 2016-04-04 DIAGNOSIS — N390 Urinary tract infection, site not specified: Secondary | ICD-10-CM | POA: Diagnosis not present

## 2016-04-04 DIAGNOSIS — Z7982 Long term (current) use of aspirin: Secondary | ICD-10-CM | POA: Diagnosis not present

## 2016-04-04 DIAGNOSIS — I10 Essential (primary) hypertension: Secondary | ICD-10-CM | POA: Insufficient documentation

## 2016-04-04 DIAGNOSIS — F32A Depression, unspecified: Secondary | ICD-10-CM

## 2016-04-04 DIAGNOSIS — F17210 Nicotine dependence, cigarettes, uncomplicated: Secondary | ICD-10-CM | POA: Diagnosis not present

## 2016-04-04 LAB — URINALYSIS, ROUTINE W REFLEX MICROSCOPIC
Bilirubin Urine: NEGATIVE
Glucose, UA: NEGATIVE mg/dL
Hgb urine dipstick: NEGATIVE
Ketones, ur: NEGATIVE mg/dL
Nitrite: POSITIVE — AB
Protein, ur: NEGATIVE mg/dL
Specific Gravity, Urine: 1.014 (ref 1.005–1.030)
pH: 6 (ref 5.0–8.0)

## 2016-04-04 LAB — RAPID URINE DRUG SCREEN, HOSP PERFORMED
Amphetamines: NOT DETECTED
Barbiturates: NOT DETECTED
Benzodiazepines: POSITIVE — AB
Cocaine: NOT DETECTED
Opiates: NOT DETECTED
Tetrahydrocannabinol: NOT DETECTED

## 2016-04-04 LAB — CBC
HCT: 42.9 % (ref 36.0–46.0)
Hemoglobin: 14.6 g/dL (ref 12.0–15.0)
MCH: 34.8 pg — ABNORMAL HIGH (ref 26.0–34.0)
MCHC: 34 g/dL (ref 30.0–36.0)
MCV: 102.4 fL — ABNORMAL HIGH (ref 78.0–100.0)
Platelets: 297 10*3/uL (ref 150–400)
RBC: 4.19 MIL/uL (ref 3.87–5.11)
RDW: 13.1 % (ref 11.5–15.5)
WBC: 7.2 10*3/uL (ref 4.0–10.5)

## 2016-04-04 LAB — BASIC METABOLIC PANEL
Anion gap: 8 (ref 5–15)
BUN: 18 mg/dL (ref 6–20)
CO2: 27 mmol/L (ref 22–32)
Calcium: 9.3 mg/dL (ref 8.9–10.3)
Chloride: 105 mmol/L (ref 101–111)
Creatinine, Ser: 0.78 mg/dL (ref 0.44–1.00)
GFR calc Af Amer: >60 mL/min (ref >60)
GFR calc non Af Amer: >60 mL/min (ref >60)
Glucose, Bld: 101 mg/dL — ABNORMAL HIGH (ref 65–99)
Potassium: 4 mmol/L (ref 3.5–5.1)
Sodium: 140 mmol/L (ref 135–145)

## 2016-04-04 MED ORDER — IBUPROFEN 200 MG PO TABS
400.0000 mg | ORAL_TABLET | Freq: Once | ORAL | Status: AC | PRN
  Administered 2016-04-04: 400 mg via ORAL
  Filled 2016-04-04: qty 2

## 2016-04-04 MED ORDER — CEPHALEXIN 500 MG PO CAPS: 500.0000 mg | ORAL_CAPSULE | Freq: Two times a day (BID) | ORAL | 0 refills | Status: DC

## 2016-04-04 NOTE — BH Assessment (Addendum)
Assessment Note  Susan Todd is an 51 y.o. female that presents this date for excessive sleeping, racing thoughts and increased depression/anxiety. Patient has brother at bedside Susan Todd [phone]) who transported patient to [facility] after patient contacted him this date and asked to be brought to [facility] "to get checked out." Patient denies any S/I, H/I or AVH. Patient is currently receiving services from [facility]. Patient reports she has been seeing Susan Savage PA at that provider for the last two years who manages her medications for depression/anxiety. Patient states she is currently prescribed lorazepam 1 mg TID and Trazodone at night. Patient is unsure of her Trazodone dosage but reports she takes those medications for anxiety. Patient also reports she is taking a anti-depressant but cannot remember the name. Patient states she feels those medications are working as indicated but wants to address those issues with her current provider. Patient is scheduled for a medication check within the next two weeks. Patient stated she has not been able to "get out of bed" the last two days reporting that she has been under excessive stress. Patient reports current stressors to include: closing on a house (financial issues), stress at work and family issues. Patient denies any previous attempts/gestures at self harm and is not requesting a inpatient admission. Patient stated "I honestly thought I was getting sick with stress." Patient is tearful during assessment but denies any thoughts of self harm. Patient also reports ongoing ETOH use which she is concerned about reporting she consumes "a couple drinks a day." Patient reports her ETOH use has increased in the last few weeks but does not consider it to be a problem. Patient does not seem to be minimizing her use but admits she "wants to cut back on her use." Patient is scheduled to see her current provider in the next two weeks to discuss treatment options  and is hoping to find a OP provider. Patient stated she presented this date to make sure her issues "were mental and not physical." Patient did state she thought her symptoms were associated with excessive stress. Patient is requesting some information to assist with counseling. Patient states she would like to be "with others and talk about her issues." patient states she would feel more comfortable in a setting with other women with similar "life issues." Patient is oriented to time place and is pleasant as she interacts with this Clinical research associate. Admission note this date states: "Per pt, states she has been sleeping a lot, for the past 2 days-states she drinks ETOH daily-states she had 2 shot this am-doesn't know what is wrong with her-increased stress at work." Case was staffed with Susan Pollack DNP who states patient does not meet inpatient criteria and should follow up with their OP provider. Provider did suggest this Clinical research associate provide patient with OP resources. Case was staffed with Hedges PA who also agreed to D/C patient this date. This Clinical research associate will provide OP resources and discuss a short term treatment plan.   Diagnosis: MDD recurrent without psychotic features, moderate GAD  Past Medical History:  Past Medical History:  Diagnosis Date  . Anxiety   . Arthritis   . Atrial fibrillation (HCC)    "pt. has no knowledge of this" as of 03-07-14  . Carotid artery occlusion   . COPD (chronic obstructive pulmonary disease) (HCC)    Emphysema  . Depression   . Hypertension     Past Surgical History:  Procedure Laterality Date  . BUNIONECTOMY     BOTH  FEET  . CYST EXCISION PERINEAL     8'03- Dr. Elana Alm    Family History:  Family History  Problem Relation Age of Onset  . Heart disease Mother     NOT  before age 88  . Hyperlipidemia Mother   . Dementia Mother   . Hypertension Mother   . Cancer Father     MELANOMA  . Hyperlipidemia Father   . Hypertension Father   . Cancer Brother     MELANOMA  .  Hyperlipidemia Brother   . Hypertension Brother   . Heart disease Brother     Arhymia  . Heart disease Maternal Aunt     Social History:  reports that she has been smoking Cigarettes.  She has a 30.00 pack-year smoking history. She has never used smokeless tobacco. She reports that she drinks alcohol. She reports that she does not use drugs.  Additional Social History:  Alcohol / Drug Use Pain Medications: See MAR Prescriptions: See MAR Over the Counter: See MAR History of alcohol / drug use?: Yes Longest period of sobriety (when/how long): Unknown Negative Consequences of Use:  (denies) Withdrawal Symptoms:  (denies) Substance #1 Name of Substance 1: ETOH 1 - Age of First Use: 19 1 - Amount (size/oz): 2 oz daily 1 - Frequency: daily 1 - Duration: Last year 1 - Last Use / Amount: 03/03/16 2 oz. liquor  CIWA: CIWA-Ar BP: 158/94 Pulse Rate: 71 COWS:    Allergies:  Allergies  Allergen Reactions  . Codeine Nausea And Vomiting  . Latex Hives    Home Medications:  (Not in a hospital admission)  OB/GYN Status:  No LMP recorded. Patient is not currently having periods (Reason: Perimenopausal).  General Assessment Data Location of Assessment: [facility] TTS Assessment: In system Is this a Tele or Face-to-Face Assessment?: Face-to-Face Is this an Initial Assessment or a Re-assessment for this encounter?: Initial Assessment Marital status: Divorced [place] name: na Is patient pregnant?: No Pregnancy Status: No Living Arrangements: Alone Can pt return to current living arrangement?: Yes Admission Status: Voluntary Is patient capable of signing voluntary admission?: Yes Referral Source: Self/Family/Friend Insurance type: [place] [facility]  Medical Screening Exam [facility] Walk-in ONLY) Medical Exam completed: Yes  Crisis Care Plan Living Arrangements: Alone Legal Guardian:  (na) Name of Psychiatrist: Polas PA Name of Therapist: None  Education Status Is patient currently  in school?: No Current Grade:  (na) Highest grade of school patient has completed:  (BA) Name of school: na Contact person: na  Risk to self with the past 6 months Suicidal Ideation: No Has patient been a risk to self within the past 6 months prior to admission? : No Suicidal Intent: No Has patient had any suicidal intent within the past 6 months prior to admission? : No Is patient at risk for suicide?: No Suicidal Plan?: No Has patient had any suicidal plan within the past 6 months prior to admission? : No Access to Means: No What has been your use of drugs/alcohol within the last 12 months?: Current use Previous Attempts/Gestures: No How many times?: 0 Other Self Harm Risks: na Triggers for Past Attempts: Unknown Intentional Self Injurious Behavior: None Family Suicide History: No Recent stressful life event(s): Other (Comment) (family issues, housing issues) Persecutory voices/beliefs?: No Depression: Yes Depression Symptoms: Guilt, Loss of interest in usual pleasures Substance abuse history and/or treatment for substance abuse?: No Suicide prevention information given to non-admitted patients: Not applicable  Risk to Others within the past 6 months Homicidal  Ideation: No Does patient have any lifetime risk of violence toward others beyond the six months prior to admission? : No Thoughts of Harm to Others: No Current Homicidal Intent: No Current Homicidal Plan: No Access to Homicidal Means: No Identified Victim: na History of harm to others?: No Assessment of Violence: None Noted Violent Behavior Description: na Does patient have access to weapons?: No Criminal Charges Pending?: No Does patient have a court date: No Is patient on probation?: No  Psychosis Hallucinations: None noted Delusions: None noted  Mental Status Report Appearance/Hygiene: Unremarkable Eye Contact: Good Motor Activity: Freedom of movement Speech: Logical/coherent Level of Consciousness:  Alert Mood: Pleasant Affect: Appropriate to circumstance Anxiety Level: Minimal Thought Processes: Coherent, Relevant Judgement: Unimpaired Orientation: Person, Place, Time Obsessive Compulsive Thoughts/Behaviors: None  Cognitive Functioning Concentration: Normal Memory: Recent Intact, Remote Intact IQ: Average Insight: Good Impulse Control: Fair Appetite: Fair Weight Loss: 0 Weight Gain: 0 Sleep: No Change Total Hours of Sleep: 8 Vegetative Symptoms: None  ADLScreening [facility] Assessment Services) Patient's cognitive ability adequate to safely complete daily activities?: Yes Patient able to express need for assistance with ADLs?: Yes Independently performs ADLs?: Yes (appropriate for developmental age)  Prior Inpatient Therapy Prior Inpatient Therapy: No Prior Therapy Dates: na Prior Therapy Facilty/Provider(s): na Reason for Treatment: na  Prior Outpatient Therapy Prior Outpatient Therapy: Yes Prior Therapy Dates: 2017 Prior Therapy Facilty/Provider(s): [facility] Reason for Treatment: Depression Does patient have an ACCT team?: No Does patient have Intensive In-House Services?  : No Does patient have [facility] services? : No Does patient have P4CC services?: No  ADL Screening (condition at time of admission) Patient's cognitive ability adequate to safely complete daily activities?: Yes Is the patient deaf or have difficulty hearing?: No Does the patient have difficulty seeing, even when wearing glasses/contacts?: No Does the patient have difficulty concentrating, remembering, or making decisions?: No Patient able to express need for assistance with ADLs?: Yes Does the patient have difficulty dressing or bathing?: No Independently performs ADLs?: Yes (appropriate for developmental age) Does the patient have difficulty walking or climbing stairs?: No Weakness of Legs: None Weakness of Arms/Hands: None  Home Assistive Devices/Equipment Home Assistive  Devices/Equipment: None  Therapy Consults (therapy consults require a physician order) PT Evaluation Needed: No OT Evalulation Needed: No SLP Evaluation Needed: No Abuse/Neglect Assessment (Assessment to be complete while patient is alone) Physical Abuse: Denies Verbal Abuse: Denies Sexual Abuse: Denies Exploitation of patient/patient's resources: Denies Self-Neglect: Denies Values / Beliefs Cultural Requests During Hospitalization: None Spiritual Requests During Hospitalization: None Consults Spiritual Care Consult Needed: No Social Work Consult Needed: No Merchant navy officer (For Healthcare) Does Patient Have a Medical Advance Directive?: No Would patient like information on creating a medical advance directive?: No - Patient declined    Additional Information 1:1 In Past 12 Months?: No CIRT Risk: No Elopement Risk: No Does patient have medical clearance?: Yes     Disposition: Case was staffed with Susan Pollack DNP who states patient does not meet inpatient criteria and should follow up with their OP provider. Provider did suggest this Clinical research associate provide patient with OP resources. Case was staffed with Hedges PA who also agreed to D/C patient this date. This Clinical research associate will provide OP resources and discuss a short term treatment plan.   Disposition Initial Assessment Completed for this Encounter: Yes Disposition of Patient: Other dispositions Other disposition(s): Other (Comment) (pt to be D/ced this date follow up OP resources provided)  On Site Evaluation by:   Reviewed with Physician:  Alfredia FergusonDavid L Saydie Gerdts 04/04/2016 3:04 PM

## 2016-04-04 NOTE — BH Assessment (Signed)
[facility] Assessment Progress Note Case was staffed with Shaune PollackLord DNP who states patient does not meet inpatient criteria and should follow up with their OP provider. Provider did suggest this Clinical research associatewriter provide patient with OP resources. Case was staffed with Hedges PA who also agreed to D/C patient this date. This Clinical research associatewriter will provide OP resources and discuss a short term treatment plan.

## 2016-04-04 NOTE — ED Provider Notes (Signed)
[facility]-EMERGENCY DEPT Provider Note   CSN: [id number] Arrival date & time: 04/04/16  0850     History   Chief Complaint Chief Complaint  Patient presents with  . Fatigue    HPI Susan Todd is a 51 y.o. female.  HPI   51 year old female with a history of anxiety and depression presents today with extreme fatigue. Patient notes that lately at work she's had extreme stress, anxiety. She reports that things are falling apart at work and at home. She has been unable to get out of bed over the last several days due to extreme fatigue, worries and worsening depression. She does not know how she can continue going on like this, she denies any suicidal or homicidal ideations or any in the past. Patient notes she is currently taking trazodone and lorazepam which initially provided some symptomatic improvement. Patient notes she is a alcoholic, drinks daily, and has had several drinks today but has not even drink as much she normally does due to her being in bed. She notes she has been unable to care for things around the house, or going to work. Patient lives alone. Patient denies any infectious etiology.  Past Medical History:  Diagnosis Date  . Anxiety   . Arthritis   . Atrial fibrillation (HCC)    "pt. has no knowledge of this" as of 03-07-14  . Carotid artery occlusion   . COPD (chronic obstructive pulmonary disease) (HCC)    Emphysema  . Depression   . Hypertension     Patient Active Problem List   Diagnosis Date Noted  . Shortness of breath-with exertion 09/23/2013  . Occlusion and stenosis of carotid artery without mention of cerebral infarction 08/22/2011  . Depression   . Anxiety   . Arthritis     Past Surgical History:  Procedure Laterality Date  . BUNIONECTOMY     BOTH FEET  . CYST EXCISION PERINEAL     8'03- Dr. Elana Alm    OB History    Gravida Para Term Preterm AB Living   3 2     1 2    SAB TAB Ectopic Multiple Live Births                   Home  Medications    Prior to Admission medications   Medication Sig Start Date End Date Taking? Authorizing Provider  acetaminophen (TYLENOL) 325 MG tablet Take 650 mg by mouth every 6 (six) hours as needed for mild pain, moderate pain or headache.    Yes Historical Provider, MD  acyclovir (ZOVIRAX) 400 MG tablet Take 400 mg by mouth 2 (two) times daily as needed (for cold sores).    Yes Historical Provider, MD  aspirin EC 81 MG tablet Take 81 mg by mouth daily.   Yes Historical Provider, MD  atorvastatin (LIPITOR) 10 MG tablet Take 10 mg by mouth every evening.    Yes Historical Provider, MD  citalopram (CELEXA) 40 MG tablet Take 40 mg by mouth at bedtime.   Yes Historical Provider, MD  diclofenac (VOLTAREN) 75 MG EC tablet Take 75 mg by mouth 2 (two) times daily.   Yes Historical Provider, MD  LORazepam (ATIVAN) 1 MG tablet Take 1 mg by mouth every 8 (eight) hours as needed for anxiety.    Yes Historical Provider, MD  losartan-hydrochlorothiazide (HYZAAR) 50-12.5 MG tablet Take 1 tablet by mouth at bedtime.   Yes Historical Provider, MD  methocarbamol (ROBAXIN) 500 MG tablet Take 250-500 mg by mouth  every 6 (six) hours as needed for muscle spasms.    Yes Historical Provider, MD  traZODone (DESYREL) 50 MG tablet Take 50 mg by mouth at bedtime.   Yes Historical Provider, MD  cephALEXin (KEFLEX) 500 MG capsule Take 1 capsule (500 mg total) by mouth 2 (two) times daily. 04/04/16   Eyvonne Mechanic, PA-C    Family History Family History  Problem Relation Age of Onset  . Heart disease Mother     NOT  before age 73  . Hyperlipidemia Mother   . Dementia Mother   . Hypertension Mother   . Cancer Father     MELANOMA  . Hyperlipidemia Father   . Hypertension Father   . Cancer Brother     MELANOMA  . Hyperlipidemia Brother   . Hypertension Brother   . Heart disease Brother     Arhymia  . Heart disease Maternal Aunt     Social History Social History  Substance Use Topics  . Smoking status:  Current Every Day Smoker    Packs/day: 1.00    Years: 30.00    Types: Cigarettes  . Smokeless tobacco: Never Used     Comment: pt states that she knows she needs to quit but has been stressed and uses cigs as a resort  . Alcohol use Yes     Comment: rare-social     Allergies   Codeine and Latex   Review of Systems Review of Systems  All other systems reviewed and are negative.    Physical Exam Updated Vital Signs BP 158/94 (BP Location: Right Arm)   Pulse 71   Temp 98.5 F (36.9 C) (Oral)   Resp 18   SpO2 97%   Physical Exam  Constitutional: She is oriented to person, place, and time. She appears well-developed and well-nourished.  HENT:  Head: Normocephalic and atraumatic.  Eyes: Conjunctivae are normal. Pupils are equal, round, and reactive to light. Right eye exhibits no discharge. Left eye exhibits no discharge. No scleral icterus.  Neck: Normal range of motion. No JVD present. No tracheal deviation present.  Pulmonary/Chest: Effort normal. No stridor.  Neurological: She is alert and oriented to person, place, and time. Coordination normal.  Psychiatric: She has a normal mood and affect. Her behavior is normal. Judgment and thought content normal.  Nursing note and vitals reviewed.    ED Treatments / Results  Labs (all labs ordered are listed, but only abnormal results are displayed) Labs Reviewed  BASIC METABOLIC PANEL - Abnormal; Notable for the following:       Result Value   Glucose, Bld 101 (*)    All other components within normal limits  CBC - Abnormal; Notable for the following:    MCV 102.4 (*)    MCH 34.8 (*)    All other components within normal limits  URINALYSIS, ROUTINE W REFLEX MICROSCOPIC - Abnormal; Notable for the following:    APPearance HAZY (*)    Nitrite POSITIVE (*)    Leukocytes, UA SMALL (*)    Bacteria, UA RARE (*)    Squamous Epithelial / LPF 6-30 (*)    All other components within normal limits  RAPID URINE DRUG SCREEN,  HOSP PERFORMED - Abnormal; Notable for the following:    Benzodiazepines POSITIVE (*)    All other components within normal limits    EKG  EKG Interpretation None       Radiology No results found.  Procedures Procedures (including critical care time)  Medications Ordered in ED  Medications  ibuprofen (ADVIL,MOTRIN) tablet 400 mg (400 mg Oral Given 04/04/16 1201)     Initial Impression / Assessment and Plan / ED Course  I have reviewed the triage vital signs and the nursing notes.  Pertinent labs & imaging results that were available during my care of the patient were reviewed by me and considered in my medical decision making (see chart for details).      Final Clinical Impressions(s) / ED Diagnoses   Final diagnoses:  Depression, unspecified depression type  Urinary tract infection without hematuria, site unspecified    Labs:  Imaging:  Consults:  Therapeutics:   Discharge Meds: keflex  Assessment/Plan: 51 year old female presents today with severe depression. Patient reports she is unable to get out of bed, manage activities around the house. During my exam she cannot even get through an entire sentence without breaking into tears and sobbing. Patient lives alone, is an alcoholic. I see no signs of infectious etiology or organic cause of patient's symptoms. TTS will be consult at for evaluation and assistance in management. Patient is medically cleared.  TTS consult and who agreed that patient could be followed up as an outpatient. She was given outpatient follow-up resources, patient discharged with return precautions. She verbalized understanding and agreement to today's plan had no further questions or concerns at time of discharge  Pt with nitrite positive urine- although low suspicion for UTI causing such pronounced fatigue patient will be treated with PCP follow-up     New Prescriptions New Prescriptions   CEPHALEXIN (KEFLEX) 500 MG CAPSULE    Take 1  capsule (500 mg total) by mouth 2 (two) times daily.     Eyvonne MechanicJeffrey Aris Moman, PA-C 04/04/16 1611    Eyvonne MechanicJeffrey Dam Ashraf, PA-C 04/04/16 1624    Cathren LaineKevin Steinl, MD 04/04/16 [phone]

## 2016-04-04 NOTE — ED Triage Notes (Signed)
Per pt, states she has been sleeping a lot, for the past 2 days-states she drinks ETOH daily-states she had 2 shot this am-doesn't know what is wrong with her-increased stress at work

## 2016-04-04 NOTE — ED Notes (Signed)
Patient aware we need urine, unable to go at this time, TTS in room at this time.

## 2016-04-04 NOTE — Discharge Instructions (Signed)
Please follow-up with outpatient resources that were given to you. Please read the attached information. Please return to the emergency room as needed.

## 2016-04-11 ENCOUNTER — Emergency Department (HOSPITAL_COMMUNITY)
Admission: EM | Admit: 2016-04-11 | Discharge: 2016-04-11 | Disposition: A | Discharge status: Home / Self Care | Payer: Commercial Managed Care - HMO | Attending: Emergency Medicine | Admitting: Emergency Medicine

## 2016-04-11 ENCOUNTER — Encounter (HOSPITAL_COMMUNITY): Payer: Self-pay | Admitting: Nurse Practitioner

## 2016-04-11 DIAGNOSIS — Z7982 Long term (current) use of aspirin: Secondary | ICD-10-CM | POA: Diagnosis not present

## 2016-04-11 DIAGNOSIS — I10 Essential (primary) hypertension: Secondary | ICD-10-CM | POA: Diagnosis not present

## 2016-04-11 DIAGNOSIS — Z91040 Latex allergy status: Secondary | ICD-10-CM | POA: Diagnosis not present

## 2016-04-11 DIAGNOSIS — F329 Major depressive disorder, single episode, unspecified: Secondary | ICD-10-CM | POA: Diagnosis present

## 2016-04-11 DIAGNOSIS — F17210 Nicotine dependence, cigarettes, uncomplicated: Secondary | ICD-10-CM | POA: Diagnosis not present

## 2016-04-11 DIAGNOSIS — Z79899 Other long term (current) drug therapy: Secondary | ICD-10-CM | POA: Diagnosis not present

## 2016-04-11 DIAGNOSIS — J449 Chronic obstructive pulmonary disease, unspecified: Secondary | ICD-10-CM | POA: Insufficient documentation

## 2016-04-11 DIAGNOSIS — F339 Major depressive disorder, recurrent, unspecified: Secondary | ICD-10-CM | POA: Diagnosis not present

## 2016-04-11 MED ORDER — SODIUM CHLORIDE 0.9 % IV BOLUS (SEPSIS): 1000.0000 mL | Freq: Once | INTRAVENOUS | Status: DC

## 2016-04-11 NOTE — ED Notes (Signed)
Bed: [facility] Expected date:  Expected time:  Means of arrival:  Comments: EMS-anxiety 

## 2016-04-11 NOTE — ED Provider Notes (Signed)
[facility]-EMERGENCY DEPT Provider Note   CSN: [id number] Arrival date & time: 04/11/16  1345     History   Chief Complaint No chief complaint on file.   HPI Susan Todd is a 51 y.o. female.  The history is provided by the patient.  Mental Health Problem  Presenting symptoms: depression   Presenting symptoms: no hallucinations, no self-mutilation and no suicidal thoughts   Degree of incapacity (severity):  Moderate Onset quality:  Gradual Duration:  9 months Timing:  Constant Progression:  Worsening Chronicity:  Chronic Context: alcohol use   Treatment compliance:  All of the time Relieved by:  Nothing Worsened by:  Alcohol Ineffective treatments:  None tried Associated symptoms comment:  Sore throat, nasal congestion, feeling dehydrated Risk factors: hx of mental illness and recent psychiatric admission     Past Medical History:  Diagnosis Date  . Anxiety   . Arthritis   . Atrial fibrillation (HCC)    "pt. has no knowledge of this" as of 03-07-14  . Carotid artery occlusion   . COPD (chronic obstructive pulmonary disease) (HCC)    Emphysema  . Depression   . Hypertension     Patient Active Problem List   Diagnosis Date Noted  . Shortness of breath-with exertion 09/23/2013  . Occlusion and stenosis of carotid artery without mention of cerebral infarction 08/22/2011  . Depression   . Anxiety   . Arthritis     Past Surgical History:  Procedure Laterality Date  . BUNIONECTOMY     BOTH FEET  . CYST EXCISION PERINEAL     8'03- Dr. Elana Alm    OB History    Gravida Para Term Preterm AB Living   3 2     1 2    SAB TAB Ectopic Multiple Live Births                   Home Medications    Prior to Admission medications   Medication Sig Start Date End Date Taking? Authorizing Provider  acetaminophen (TYLENOL) 325 MG tablet Take 650 mg by mouth every 6 (six) hours as needed for mild pain, moderate pain or headache.     Historical Provider, MD  acyclovir  (ZOVIRAX) 400 MG tablet Take 400 mg by mouth 2 (two) times daily as needed (for cold sores).     Historical Provider, MD  aspirin EC 81 MG tablet Take 81 mg by mouth daily.    Historical Provider, MD  atorvastatin (LIPITOR) 10 MG tablet Take 10 mg by mouth every evening.     Historical Provider, MD  cephALEXin (KEFLEX) 500 MG capsule Take 1 capsule (500 mg total) by mouth 2 (two) times daily. 04/04/16   Eyvonne Mechanic, PA-C  citalopram (CELEXA) 40 MG tablet Take 40 mg by mouth at bedtime.    Historical Provider, MD  diclofenac (VOLTAREN) 75 MG EC tablet Take 75 mg by mouth 2 (two) times daily.    Historical Provider, MD  LORazepam (ATIVAN) 1 MG tablet Take 1 mg by mouth every 8 (eight) hours as needed for anxiety.     Historical Provider, MD  losartan-hydrochlorothiazide (HYZAAR) 50-12.5 MG tablet Take 1 tablet by mouth at bedtime.    Historical Provider, MD  methocarbamol (ROBAXIN) 500 MG tablet Take 250-500 mg by mouth every 6 (six) hours as needed for muscle spasms.     Historical Provider, MD  traZODone (DESYREL) 50 MG tablet Take 50 mg by mouth at bedtime.    Historical Provider, MD  Family History Family History  Problem Relation Age of Onset  . Heart disease Mother     NOT  before age 53  . Hyperlipidemia Mother   . Dementia Mother   . Hypertension Mother   . Cancer Father     MELANOMA  . Hyperlipidemia Father   . Hypertension Father   . Cancer Brother     MELANOMA  . Hyperlipidemia Brother   . Hypertension Brother   . Heart disease Brother     Arhymia  . Heart disease Maternal Aunt     Social History Social History  Substance Use Topics  . Smoking status: Current Every Day Smoker    Packs/day: 1.00    Years: 30.00    Types: Cigarettes  . Smokeless tobacco: Never Used     Comment: pt states that she knows she needs to quit but has been stressed and uses cigs as a resort  . Alcohol use Yes     Comment: rare-social     Allergies   Codeine and Latex   Review of  Systems Review of Systems  Psychiatric/Behavioral: Negative for hallucinations, self-injury and suicidal ideas.  All other systems reviewed and are negative.    Physical Exam Updated Vital Signs BP (!) 116/50   Pulse 105   Resp 18   SpO2 94%   Physical Exam  Constitutional: She is oriented to person, place, and time. She appears well-developed and well-nourished. No distress.  HENT:  Head: Normocephalic.  Nose: Nose normal.  Eyes: Conjunctivae are normal.  Neck: Neck supple. No tracheal deviation present.  Cardiovascular: Normal rate, regular rhythm and normal heart sounds.   Pulmonary/Chest: Effort normal and breath sounds normal. No respiratory distress.  Abdominal: Soft. She exhibits no distension. There is no tenderness. There is no guarding.  Neurological: She is alert and oriented to person, place, and time.  Skin: Skin is warm and dry. Capillary refill takes less than 2 seconds.  Psychiatric: She has a normal mood and affect. Her behavior is normal.  Vitals reviewed.    ED Treatments / Results  Labs (all labs ordered are listed, but only abnormal results are displayed) Labs Reviewed - No data to display  EKG  EKG Interpretation None       Radiology No results found.  Procedures Procedures (including critical care time)  Medications Ordered in ED Medications  sodium chloride 0.9 % bolus 1,000 mL (not administered)     Initial Impression / Assessment and Plan / ED Course  I have reviewed the triage vital signs and the nursing notes.  Pertinent labs & imaging results that were available during my care of the patient were reviewed by me and considered in my medical decision making (see chart for details).     51 y.o. female presents with Ongoing depression for 9 months. She made an appointment with her psychiatrist for tomorrow but canceled it because she believes she is going to be admitted to behavioral health today. She endorses symptoms of  depression and ongoing alcohol abuse and feels like she has had loss of appetite and is dehydrated. Her vital signs are stable and she has normal behavior here. She denies SI and HI and AVH. No indication for emergent evaluation by psychiatry here as she has appropriate follow-up. Return precautions discussed for feelings of suicidal nature or other concerning symptoms. She was given aggressive IV fluid bolus it was started prior to her arrival to help with what she feels is dehydration but she is  objectively normal appearing and I recommended alcohol cessation in a safe manner to help with the symptoms.  Final Clinical Impressions(s) / ED Diagnoses   Final diagnoses:  Recurrent major depressive disorder, remission status unspecified (HCC)    New Prescriptions New Prescriptions   No medications on file     Lyndal Pulley, MD 04/11/16 1514

## 2016-04-11 NOTE — ED Triage Notes (Signed)
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 Recently got a divorce and has had a relapse over the past few months has severe depression and anxiety to the point where she is'nt functioning or eating or drinking. Only drinks vodka. Friends and family states they have never seen her this bad off.

## 2016-11-03 ENCOUNTER — Emergency Department (HOSPITAL_COMMUNITY): Payer: Self-pay

## 2016-11-03 ENCOUNTER — Inpatient Hospital Stay (HOSPITAL_COMMUNITY)
Admission: EM | Admit: 2016-11-03 | Discharge: 2016-11-06 | DRG: 897 | Disposition: A | Discharge status: Home / Self Care | Payer: Self-pay | Attending: Internal Medicine | Admitting: Internal Medicine

## 2016-11-03 DIAGNOSIS — F1410 Cocaine abuse, uncomplicated: Secondary | ICD-10-CM

## 2016-11-03 DIAGNOSIS — E876 Hypokalemia: Secondary | ICD-10-CM | POA: Diagnosis not present

## 2016-11-03 DIAGNOSIS — F10239 Alcohol dependence with withdrawal, unspecified: Principal | ICD-10-CM

## 2016-11-03 DIAGNOSIS — F1010 Alcohol abuse, uncomplicated: Secondary | ICD-10-CM

## 2016-11-03 DIAGNOSIS — Z7982 Long term (current) use of aspirin: Secondary | ICD-10-CM

## 2016-11-03 DIAGNOSIS — J449 Chronic obstructive pulmonary disease, unspecified: Secondary | ICD-10-CM

## 2016-11-03 DIAGNOSIS — S01511A Laceration without foreign body of lip, initial encounter: Secondary | ICD-10-CM | POA: Diagnosis present

## 2016-11-03 DIAGNOSIS — J439 Emphysema, unspecified: Secondary | ICD-10-CM | POA: Diagnosis present

## 2016-11-03 DIAGNOSIS — Z888 Allergy status to other drugs, medicaments and biological substances status: Secondary | ICD-10-CM

## 2016-11-03 DIAGNOSIS — X58XXXA Exposure to other specified factors, initial encounter: Secondary | ICD-10-CM | POA: Diagnosis present

## 2016-11-03 DIAGNOSIS — F419 Anxiety disorder, unspecified: Secondary | ICD-10-CM | POA: Diagnosis present

## 2016-11-03 DIAGNOSIS — F1490 Cocaine use, unspecified, uncomplicated: Secondary | ICD-10-CM

## 2016-11-03 DIAGNOSIS — F329 Major depressive disorder, single episode, unspecified: Secondary | ICD-10-CM | POA: Diagnosis present

## 2016-11-03 DIAGNOSIS — M1991 Primary osteoarthritis, unspecified site: Secondary | ICD-10-CM | POA: Diagnosis present

## 2016-11-03 DIAGNOSIS — R569 Unspecified convulsions: Secondary | ICD-10-CM

## 2016-11-03 DIAGNOSIS — Z885 Allergy status to narcotic agent status: Secondary | ICD-10-CM

## 2016-11-03 DIAGNOSIS — E785 Hyperlipidemia, unspecified: Secondary | ICD-10-CM | POA: Diagnosis present

## 2016-11-03 DIAGNOSIS — I10 Essential (primary) hypertension: Secondary | ICD-10-CM | POA: Diagnosis present

## 2016-11-03 DIAGNOSIS — F32A Depression, unspecified: Secondary | ICD-10-CM | POA: Diagnosis present

## 2016-11-03 DIAGNOSIS — E871 Hypo-osmolality and hyponatremia: Secondary | ICD-10-CM | POA: Diagnosis present

## 2016-11-03 DIAGNOSIS — Z79899 Other long term (current) drug therapy: Secondary | ICD-10-CM

## 2016-11-03 DIAGNOSIS — F17210 Nicotine dependence, cigarettes, uncomplicated: Secondary | ICD-10-CM | POA: Diagnosis present

## 2016-11-03 LAB — ETHANOL: Alcohol, Ethyl (B): <5 mg/dL (ref <5)

## 2016-11-03 LAB — URINALYSIS, ROUTINE W REFLEX MICROSCOPIC
Bilirubin Urine: NEGATIVE
Glucose, UA: NEGATIVE mg/dL
Ketones, ur: NEGATIVE mg/dL
Leukocytes, UA: NEGATIVE
Nitrite: NEGATIVE
Protein, ur: 30 mg/dL — AB
Specific Gravity, Urine: 1.005 (ref 1.005–1.030)
pH: 5 (ref 5.0–8.0)

## 2016-11-03 LAB — CBC WITH DIFFERENTIAL/PLATELET
Basophils Absolute: 0 10*3/uL (ref 0.0–0.1)
Basophils Relative: 0 %
Eosinophils Absolute: 0.1 10*3/uL (ref 0.0–0.7)
Eosinophils Relative: 1 %
HCT: 41.6 % (ref 36.0–46.0)
Hemoglobin: 14.5 g/dL (ref 12.0–15.0)
Lymphocytes Relative: 17 %
Lymphs Abs: 1.6 10*3/uL (ref 0.7–4.0)
MCH: 35.3 pg — ABNORMAL HIGH (ref 26.0–34.0)
MCHC: 34.9 g/dL (ref 30.0–36.0)
MCV: 101.2 fL — ABNORMAL HIGH (ref 78.0–100.0)
Monocytes Absolute: 0.7 10*3/uL (ref 0.1–1.0)
Monocytes Relative: 8 %
Neutro Abs: 6.6 10*3/uL (ref 1.7–7.7)
Neutrophils Relative %: 74 %
Platelets: 169 10*3/uL (ref 150–400)
RBC: 4.11 MIL/uL (ref 3.87–5.11)
RDW: 13.4 % (ref 11.5–15.5)
WBC: 8.9 10*3/uL (ref 4.0–10.5)

## 2016-11-03 LAB — I-STAT BETA HCG BLOOD, ED (MC, WL, AP ONLY): I-stat hCG, quantitative: <5 m[IU]/mL (ref <5)

## 2016-11-03 LAB — CBG MONITORING, ED
Glucose-Capillary: 156 mg/dL — ABNORMAL HIGH (ref 65–99)
Glucose-Capillary: 122 mg/dL — ABNORMAL HIGH (ref 65–99)

## 2016-11-03 LAB — TROPONIN I
Troponin I: <0.03 ng/mL (ref <0.03)
Troponin I: <0.03 ng/mL (ref <0.03)

## 2016-11-03 LAB — SALICYLATE LEVEL: Salicylate Lvl: <7 mg/dL (ref 2.8–30.0)

## 2016-11-03 LAB — BASIC METABOLIC PANEL
Anion gap: 12 (ref 5–15)
BUN: <5 mg/dL — ABNORMAL LOW (ref 6–20)
CO2: 25 mmol/L (ref 22–32)
Calcium: 9.4 mg/dL (ref 8.9–10.3)
Chloride: 95 mmol/L — ABNORMAL LOW (ref 101–111)
Creatinine, Ser: 0.6 mg/dL (ref 0.44–1.00)
GFR calc Af Amer: >60 mL/min (ref >60)
GFR calc non Af Amer: >60 mL/min (ref >60)
Glucose, Bld: 129 mg/dL — ABNORMAL HIGH (ref 65–99)
Potassium: 3.5 mmol/L (ref 3.5–5.1)
Sodium: 132 mmol/L — ABNORMAL LOW (ref 135–145)

## 2016-11-03 LAB — RAPID URINE DRUG SCREEN, HOSP PERFORMED
Amphetamines: NOT DETECTED
Barbiturates: NOT DETECTED
Benzodiazepines: NOT DETECTED
Cocaine: POSITIVE — AB
Opiates: NOT DETECTED
Tetrahydrocannabinol: NOT DETECTED

## 2016-11-03 LAB — ACETAMINOPHEN LEVEL: Acetaminophen (Tylenol), Serum: <10 ug/mL — ABNORMAL LOW (ref 10–30)

## 2016-11-03 LAB — HEMOGLOBIN A1C
Hgb A1c MFr Bld: 5.1 % (ref 4.8–5.6)
Mean Plasma Glucose: 99.67 mg/dL

## 2016-11-03 MED ORDER — CITALOPRAM HYDROBROMIDE 40 MG PO TABS
40.0000 mg | ORAL_TABLET | Freq: Every day | ORAL | Status: DC
  Administered 2016-11-03 – 2016-11-05 (×3): 40 mg via ORAL
  Filled 2016-11-03 (×3): qty 1

## 2016-11-03 MED ORDER — HYDROCODONE-ACETAMINOPHEN 5-325 MG PO TABS
1.0000 | ORAL_TABLET | ORAL | Status: DC | PRN
  Administered 2016-11-05 (×3): 1 via ORAL
  Filled 2016-11-03 (×2): qty 1
  Filled 2016-11-03: qty 2

## 2016-11-03 MED ORDER — SODIUM CHLORIDE 0.9 % IV SOLN: INTRAVENOUS | Status: DC

## 2016-11-03 MED ORDER — LORAZEPAM 2 MG/ML IJ SOLN: 1.0000 mg | Freq: Four times a day (QID) | INTRAMUSCULAR | Status: DC | PRN

## 2016-11-03 MED ORDER — BISACODYL 10 MG RE SUPP: 10.0000 mg | Freq: Every day | RECTAL | Status: DC | PRN

## 2016-11-03 MED ORDER — LORAZEPAM 1 MG PO TABS
1.0000 mg | ORAL_TABLET | Freq: Four times a day (QID) | ORAL | Status: DC | PRN
  Administered 2016-11-04: 1 mg via ORAL
  Filled 2016-11-03: qty 1

## 2016-11-03 MED ORDER — ADULT MULTIVITAMIN W/MINERALS CH
1.0000 | ORAL_TABLET | Freq: Every day | ORAL | Status: DC
  Administered 2016-11-03 – 2016-11-06 (×4): 1 via ORAL
  Filled 2016-11-03 (×4): qty 1

## 2016-11-03 MED ORDER — LORAZEPAM 2 MG/ML IJ SOLN
2.0000 mg | Freq: Once | INTRAMUSCULAR | Status: AC | PRN
  Administered 2016-11-03: 2 mg via INTRAVENOUS
  Filled 2016-11-03: qty 1

## 2016-11-03 MED ORDER — LORAZEPAM 1 MG PO TABS: 1.0000 mg | ORAL_TABLET | Freq: Three times a day (TID) | ORAL | Status: DC | PRN

## 2016-11-03 MED ORDER — ASPIRIN EC 81 MG PO TBEC
81.0000 mg | DELAYED_RELEASE_TABLET | Freq: Every day | ORAL | Status: DC
Start: 2016-11-04 — End: 2016-11-06
  Administered 2016-11-04 – 2016-11-06 (×3): 81 mg via ORAL
  Filled 2016-11-03 (×3): qty 1

## 2016-11-03 MED ORDER — LORAZEPAM 2 MG/ML IJ SOLN
1.0000 mg | Freq: Once | INTRAMUSCULAR | Status: DC | PRN
  Filled 2016-11-03: qty 1

## 2016-11-03 MED ORDER — SENNOSIDES-DOCUSATE SODIUM 8.6-50 MG PO TABS: 1.0000 | ORAL_TABLET | Freq: Every evening | ORAL | Status: DC | PRN

## 2016-11-03 MED ORDER — LORAZEPAM 2 MG/ML IJ SOLN: 1.0000 mg | Freq: Once | INTRAMUSCULAR | Status: DC

## 2016-11-03 MED ORDER — ATORVASTATIN CALCIUM 10 MG PO TABS
10.0000 mg | ORAL_TABLET | Freq: Every evening | ORAL | Status: DC
  Administered 2016-11-03 – 2016-11-05 (×3): 10 mg via ORAL
  Filled 2016-11-03 (×3): qty 1

## 2016-11-03 MED ORDER — ONDANSETRON HCL 4 MG PO TABS: 4.0000 mg | ORAL_TABLET | Freq: Four times a day (QID) | ORAL | Status: DC | PRN

## 2016-11-03 MED ORDER — THIAMINE HCL 100 MG/ML IJ SOLN: 100.0000 mg | Freq: Every day | INTRAMUSCULAR | Status: DC

## 2016-11-03 MED ORDER — SODIUM CHLORIDE 0.9 % IV BOLUS (SEPSIS)
1000.0000 mL | Freq: Once | INTRAVENOUS | Status: AC
  Administered 2016-11-03: 1000 mL via INTRAVENOUS

## 2016-11-03 MED ORDER — SODIUM CHLORIDE 0.9 % IV SOLN
INTRAVENOUS | Status: DC
  Administered 2016-11-03 – 2016-11-06 (×8): via INTRAVENOUS

## 2016-11-03 MED ORDER — FOLIC ACID 1 MG PO TABS
1.0000 mg | ORAL_TABLET | Freq: Every day | ORAL | Status: DC
  Administered 2016-11-03 – 2016-11-06 (×4): 1 mg via ORAL
  Filled 2016-11-03 (×4): qty 1

## 2016-11-03 MED ORDER — ONDANSETRON HCL 4 MG/2ML IJ SOLN: 4.0000 mg | Freq: Four times a day (QID) | INTRAMUSCULAR | Status: DC | PRN

## 2016-11-03 MED ORDER — HEPARIN SODIUM (PORCINE) 5000 UNIT/ML IJ SOLN
5000.0000 [IU] | Freq: Three times a day (TID) | INTRAMUSCULAR | Status: DC
  Administered 2016-11-03 – 2016-11-06 (×8): 5000 [IU] via SUBCUTANEOUS
  Filled 2016-11-03 (×8): qty 1

## 2016-11-03 MED ORDER — VITAMIN B-1 100 MG PO TABS
100.0000 mg | ORAL_TABLET | Freq: Every day | ORAL | Status: DC
  Administered 2016-11-03 – 2016-11-06 (×4): 100 mg via ORAL
  Filled 2016-11-03 (×4): qty 1

## 2016-11-03 MED ORDER — ACETAMINOPHEN 325 MG PO TABS
650.0000 mg | ORAL_TABLET | Freq: Four times a day (QID) | ORAL | Status: DC | PRN
  Administered 2016-11-04: 650 mg via ORAL
  Filled 2016-11-03: qty 2

## 2016-11-03 MED ORDER — TRAZODONE HCL 50 MG PO TABS
50.0000 mg | ORAL_TABLET | Freq: Every day | ORAL | Status: DC
  Administered 2016-11-03 – 2016-11-05 (×3): 50 mg via ORAL
  Filled 2016-11-03 (×3): qty 1

## 2016-11-03 NOTE — ED Notes (Signed)
CBG 156 

## 2016-11-03 NOTE — ED Triage Notes (Signed)
Per ems, pt from home had witnessed seizure, ETOH last night, pt states shes an alcoholic, pt post ictal upon ems arrival, pt now AAOX4. Just doesn't remmeber the seizure. C collar applied by ems, removed by PA Lower lip laceration, dried blood on lips. No neuro deficits.

## 2016-11-03 NOTE — Consult Note (Signed)
NEURO HOSPITALIST CONSULT NOTE   Requestig physician: Dr. Benjamine Mola  Reason for Consult: New onset seizures  History obtained from:  Patient and Chart     HPI:                                                                                                                                          Susan Todd is an 51 y.o. female who presented to the ED after a first time seizure occurred at home. The seizure was witnessed by her firend. She was postictal on arrival by EMS. The seizure occurred in the context of habitual EtOH use followed by, per patient, abrupt discontinuation 2 days ago. Her history giving is inconsistent, having told other staff members that she used EtOH last night. She stated to Neurologist that on average she drinks 6 beers per day, but told ED staff that it was 3-4 beers/day on a regular basis. She denied to Neurologist drinking last night but endorsed a 12 pack to ED staff. She has never had a seizure before.   The patient states she does not remember the seizure itself. A lower lip laceration and dried blood on lips were noted by ED staff.    Her PMHx includes anxiety, depression, COPD, carotid artery occlusion,HTN and a listed history of atrial fibrillation (of note "patient has no knowledge of this" per documentation in EPIC).  Past Medical History:  Diagnosis Date  . Anxiety   . Arthritis   . Atrial fibrillation (HCC)    "pt. has no knowledge of this" as of 03-07-14  . Carotid artery occlusion   . COPD (chronic obstructive pulmonary disease) (HCC)    Emphysema  . Depression   . Hypertension     Past Surgical History:  Procedure Laterality Date  . BUNIONECTOMY     BOTH FEET  . CYST EXCISION PERINEAL     8'03- Dr. Elana Alm    Family History  Problem Relation Age of Onset  . Heart disease Mother        NOT  before age 58  . Hyperlipidemia Mother   . Dementia Mother   . Hypertension Mother   . Cancer Father        MELANOMA  .  Hyperlipidemia Father   . Hypertension Father   . Cancer Brother        MELANOMA  . Hyperlipidemia Brother   . Hypertension Brother   . Heart disease Brother        Arhymia  . Heart disease Maternal Aunt     Social History:  reports that she has been smoking Cigarettes.  She has a 30.00 pack-year smoking history. She has never used smokeless tobacco. She reports that she drinks alcohol. She reports that she does not use drugs.  Allergies  Allergen Reactions  . Codeine Nausea And Vomiting  . Latex Hives    MEDICATIONS:                                                                                                                     Prior to Admission:  Acyclovir Voltaren Hyzaar Robaxin Cepalexin Tylenol ASA Lipitor Celexa Ativan Trazodone  ROS:                                                                                                                                       No headache, neck pain or limb pain. No bruising. No urinary or fecal incontinence during seizure. No chest pain or SOB. Positive for lip and tongue pain secondary to bite injuries.   Blood pressure (!) 164/95, pulse 96, temperature 98.5 F (36.9 C), temperature source Oral, resp. rate (!) 23, height  (1.778 m), weight 81.6 kg (180 lb), SpO2 93 %.   General Examination:                                                                                                      HEENT-  Normocephalic. Swelling of tongue with bite mark on right. Swelling of right lower lip with bite mark.  Lungs- Respirations unlabored Extremities- Warm and well perfused  Neurological Examination Mental Status: Mild drowsiness. Awakens to an alert and oriented state. Thought content appropriate.  Speech fluent without evidence of aphasia.  Able to follow all commands without difficulty. Cranial Nerves: II: Visual fields intact. PERRL  III,IV, VI: ptosis not present, EOMI without nystagmus V,VII: No facial droop.  Facial temp sensation normal bilaterally VIII: hearing intact to voice IX,X: no hypophonia XI: symmetric XII: midline tongue extension Motor: Right : Upper extremity   5/5    Left:     Upper extremity   5/5  Lower extremity   5/5     Lower extremity   5/5 Normal tone throughout; no atrophy noted Sensory: Temp and light  touch intact x 4. No extinction.  Deep Tendon Reflexes: 2+ and symmetric throughout Cerebellar: No ataxia with FNF bilaterally Gait: Deferred   Lab Results: Basic Metabolic Panel:  Recent Labs Lab 11/03/16 1145  NA 132*  K 3.5  CL 95*  CO2 25  GLUCOSE 129*  BUN <5*  CREATININE 0.60  CALCIUM 9.4    Liver Function Tests: No results for input(s): AST, ALT, ALKPHOS, BILITOT, PROT, ALBUMIN in the last 168 hours. No results for input(s): LIPASE, AMYLASE in the last 168 hours. No results for input(s): AMMONIA in the last 168 hours.  CBC:  Recent Labs Lab 11/03/16 1145  WBC 8.9  NEUTROABS 6.6  HGB 14.5  HCT 41.6  MCV 101.2*  PLT 169    Cardiac Enzymes: No results for input(s): CKTOTAL, CKMB, CKMBINDEX, TROPONINI in the last 168 hours.  Lipid Panel: No results for input(s): CHOL, TRIG, HDL, CHOLHDL, VLDL, LDLCALC in the last 168 hours.  CBG:  Recent Labs Lab 11/03/16 1124  GLUCAP 156*    Microbiology:   Coagulation Studies: No results for input(s): LABPROT, INR in the last 72 hours.  Imaging: Ct Head Wo Contrast  Result Date: 11/03/2016 CLINICAL DATA:  New onset seizure. EXAM: CT HEAD WITHOUT CONTRAST TECHNIQUE: Contiguous axial images were obtained from the base of the skull through the vertex without intravenous contrast. COMPARISON:  MRI brain dated August 12, 2011. CT head dated November 11, 2005. To the FINDINGS: Brain: No evidence of acute infarction, hemorrhage, hydrocephalus, extra-axial collection or mass lesion/mass effect. Vascular: Atherosclerotic vascular calcification of the carotid siphons. No hyperdense vessel. Skull:  Normal. Negative for fracture or focal lesion. Sinuses/Orbits: The bilateral paranasal sinuses and mastoid air cells are clear. The orbits are unremarkable. Other: None. IMPRESSION: Normal noncontrast head CT. Electronically Signed   By: Obie Dredge M.D.   On: 11/03/2016 12:56    Assessment: 51 year old female with first time seizure, most likely secondary to EtOH withdrawal 1. EtOH level < 5, consistent with withdrawal.  2. Cocaine positive. May have lowered her seizure threshold. 3. CT head normal.  4. Mild hyponatremia. Not low enough to account for seizure.   Recommendations: 1. EEG in AM 2. MRI in AM 3  Magnesium level. 4. CIWA protocol and scheduled benzodiazepine for 3 days followed by 2 day taper. One option is Valium 10 mg q6h x 4 doses, followed by 5 mg q6h x 8 doses, followed by 2 day taper.  5. EtOH and cocaine cessation counseling.  6. Seizure precautions.  7. Contact PCP to determine if she indeed has a diagnosis of atrial fibrillation or not. Of note, EKG in ED shows sinus rhythm.    Electronically signed: Dr. Caryl Pina 11/03/2016, 3:27 PM

## 2016-11-03 NOTE — Progress Notes (Signed)
Pt arrived to 5 west 07. Alert and oriented x 4, VS stable, denied SOB and chest pain, no signs of acute distress. Pt identified appropriately, skin assessment performed, and history obtained from pt. Cardiac monitor and continuous pulse ox placed, and bed alarm activated. Pt instructed to call for assistance, and verbalized knowledge on how to use call bell. Call bell with in reach.  Will continue to monitor and treat pt per MD orders.

## 2016-11-03 NOTE — ED Notes (Signed)
Pt tranported up to 5W. Pt only had pocketbook at bedside with her. Pt states "I had 2 bags of clothes" pt's gf was here earlier and pt states "she must've taken them home with her" Pocket book in bed with pt going upstairs.

## 2016-11-03 NOTE — H&P (Signed)
History and Physical    Susan Todd ZOX:[id number] DOB: March 21, 1965 DOA: 11/03/2016   PCP: Georgann Housekeeper, MD   Patient coming from:  Home    Chief Complaint: Seizures   HPI: Susan Todd is a 51 y.o. female with medical history significant for anxiety,depression, COPD, HTN, chronic ETOH abuse, and recent cocaine abuse, brought by EMS after patient experienced an episode of seizure while at home.  History is obtained by friend as she is postictal. She apparently drinks about 3-4 beers a day and yesterday she consumed a 12 pack. In addition, she had taken cocaine. It appears that the patient has been undergoing significant stress at home and has been increasing the use of substance.Friend reports the seizure had lasted about 2 minutes and that the patient had no recollection of the event. Friend reports she was not confused thereafter, and di not hit her head. Friend reports patient had urine incontinence during the seizure. No apparent cardiac or respiratory issues. No history of cancer. No other complains are reported by friend  ED Course:  BP (!) 155/93   Pulse 98   Temp 98.5 F (36.9 C) (Oral)   Resp 17   Ht  (1.778 m)   Wt 81.6 kg (180 lb)   SpO2 98%   BMI 25.83 kg/m    Sodium 132 potassium 3.5 glucose 129 creatinine 0.6 white count 8.9 hemoglobin 14.5 platelets 169 Tylenol negative Salicylate less than 7  Urinalysis negative Urine positive for cocaine HIV pending CT of the head negative for acute findings.  Review of Systems:  As per HPI otherwise all other systems reviewed and are negative  Past Medical History:  Diagnosis Date  . Anxiety   . Arthritis   . Atrial fibrillation (HCC)    "pt. has no knowledge of this" as of 03-07-14  . Carotid artery occlusion   . COPD (chronic obstructive pulmonary disease) (HCC)    Emphysema  . Depression   . Hypertension     Past Surgical History:  Procedure Laterality Date  . BUNIONECTOMY     BOTH FEET  . CYST EXCISION  PERINEAL     8'03- Dr. Elana Alm    Social History Social History   Social History  . Marital status: Married    Spouse name: N/A  . Number of children: N/A  . Years of education: N/A   Occupational History  . Not on file.   Social History Main Topics  . Smoking status: Current Every Day Smoker    Packs/day: 1.00    Years: 30.00    Types: Cigarettes  . Smokeless tobacco: Never Used     Comment: pt states that she knows she needs to quit but has been stressed and uses cigs as a resort  . Alcohol use Yes     Comment: rare-social  . Drug use: No     Comment: abused drugs in the past 1989  . Sexual activity: Not Currently    Birth control/ protection: None   Other Topics Concern  . Not on file   Social History Narrative  . No narrative on file     Allergies  Allergen Reactions  . Codeine Nausea And Vomiting  . Latex Hives    Family History  Problem Relation Age of Onset  . Heart disease Mother        NOT  before age 76  . Hyperlipidemia Mother   . Dementia Mother   . Hypertension Mother   . Cancer  Father        MELANOMA  . Hyperlipidemia Father   . Hypertension Father   . Cancer Brother        MELANOMA  . Hyperlipidemia Brother   . Hypertension Brother   . Heart disease Brother        Arhymia  . Heart disease Maternal Aunt       Prior to Admission medications   Medication Sig Start Date End Date Taking? Authorizing Provider  acetaminophen (TYLENOL) 325 MG tablet Take 650 mg by mouth every 6 (six) hours as needed for mild pain, moderate pain or headache.     [provider]  acyclovir (ZOVIRAX) 400 MG tablet Take 400 mg by mouth 2 (two) times daily as needed (for cold sores).     [provider]  aspirin EC 81 MG tablet Take 81 mg by mouth daily.    [provider]  atorvastatin (LIPITOR) 10 MG tablet Take 10 mg by mouth every evening.     [provider]  cephALEXin (KEFLEX) 500 MG capsule Take 1 capsule (500 mg  total) by mouth 2 (two) times daily. 04/04/16   Hedges, Tinnie Gens, PA-C  citalopram (CELEXA) 40 MG tablet Take 40 mg by mouth at bedtime.    [provider]  diclofenac (VOLTAREN) 75 MG EC tablet Take 75 mg by mouth 2 (two) times daily.    [provider]  LORazepam (ATIVAN) 1 MG tablet Take 1 mg by mouth every 8 (eight) hours as needed for anxiety.     [provider]  losartan-hydrochlorothiazide (HYZAAR) 50-12.5 MG tablet Take 1 tablet by mouth at bedtime.    [provider]  methocarbamol (ROBAXIN) 500 MG tablet Take 250-500 mg by mouth every 6 (six) hours as needed for muscle spasms.     [provider]  traZODone (DESYREL) 50 MG tablet Take 50 mg by mouth at bedtime.    [provider]    Physical Exam:  Vitals:   11/03/16 1445 11/03/16 1508 11/03/16 1530 11/03/16 1545  BP: (!) 161/75 (!) 164/95 (!) 160/90 (!) 155/93  Pulse: 91 96 93 98  Resp: (!) 22 (!) 23 (!) 23 17  Temp:      TempSrc:      SpO2: 96% 93% 92% 98%  Weight:      Height:       Constitutional: NAD Postictal  Eyes: PERRL, lids and conjunctivae normal ENMT: Mucous membranes are moist, without exudate, her lips are showing areas of bruising, likely due to biting during seizure  Neck: normal, supple, no masses, no thyromegaly Respiratory: clear to auscultation bilaterally, no wheezing, no crackles. Normal respiratory effort  Cardiovascular: tachycardic with regular  rate and rhythm,  murmur, rubs or gallops. No extremity edema. 2+ pedal pulses. No carotid bruits.  Abdomen: Soft, non tender, No hepatosplenomegaly. Bowel sounds positive.  Musculoskeletal: no clubbing / cyanosis. Moves all extremities Skin: no jaundice, No lesions. Multiple tattoos  Neurologic: Sensation intact  Strength appears equal in all extremities. Lethargic at this time due to postictal state     Labs on Admission: I have personally reviewed following labs and imaging studies  CBC:  Recent  Labs Lab 11/03/16 1145  WBC 8.9  NEUTROABS 6.6  HGB 14.5  HCT 41.6  MCV 101.2*  PLT 169    Basic Metabolic Panel:  Recent Labs Lab 11/03/16 1145  NA 132*  K 3.5  CL 95*  CO2 25  GLUCOSE 129*  BUN <5*  CREATININE 0.60  CALCIUM 9.4    GFR: Estimated Creatinine Clearance: 87.9 mL/min (by C-G formula based on SCr of 0.6 mg/dL).  Liver Function Tests: No results for input(s): AST, ALT, ALKPHOS, BILITOT, PROT, ALBUMIN in the last 168 hours. No results for input(s): LIPASE, AMYLASE in the last 168 hours. No results for input(s): AMMONIA in the last 168 hours.  Coagulation Profile: No results for input(s): INR, PROTIME in the last 168 hours.  Cardiac Enzymes: No results for input(s): CKTOTAL, CKMB, CKMBINDEX, TROPONINI in the last 168 hours.  BNP (last 3 results) No results for input(s): PROBNP in the last 8760 hours.  HbA1C: No results for input(s): HGBA1C in the last 72 hours.  CBG:  Recent Labs Lab 11/03/16 1124  GLUCAP 156*    Lipid Profile: No results for input(s): CHOL, HDL, LDLCALC, TRIG, CHOLHDL, LDLDIRECT in the last 72 hours.  Thyroid Function Tests: No results for input(s): TSH, T4TOTAL, FREET4, T3FREE, THYROIDAB in the last 72 hours.  Anemia Panel: No results for input(s): VITAMINB12, FOLATE, FERRITIN, TIBC, IRON, RETICCTPCT in the last 72 hours.  Urine analysis:    Component Value Date/Time   COLORURINE YELLOW 11/03/2016 1147   APPEARANCEUR CLEAR 11/03/2016 1147   LABSPEC 1.005 11/03/2016 1147   PHURINE 5.0 11/03/2016 1147   GLUCOSEU NEGATIVE 11/03/2016 1147   HGBUR SMALL (A) 11/03/2016 1147   BILIRUBINUR NEGATIVE 11/03/2016 1147   KETONESUR NEGATIVE 11/03/2016 1147   PROTEINUR 30 (A) 11/03/2016 1147   UROBILINOGEN 0.2 08/23/2006 1144   NITRITE NEGATIVE 11/03/2016 1147   LEUKOCYTESUR NEGATIVE 11/03/2016 1147    Sepsis Labs: @LABRCNTIP (procalcitonin:4,lacticidven:4) )No results found for this or any previous visit (from the past  240 hour(s)).   Radiological Exams on Admission: Ct Head Wo Contrast  Result Date: 11/03/2016 CLINICAL DATA:  New onset seizure. EXAM: CT HEAD WITHOUT CONTRAST TECHNIQUE: Contiguous axial images were obtained from the base of the skull through the vertex without intravenous contrast. COMPARISON:  MRI brain dated August 12, 2011. CT head dated November 11, 2005. To the FINDINGS: Brain: No evidence of acute infarction, hemorrhage, hydrocephalus, extra-axial collection or mass lesion/mass effect. Vascular: Atherosclerotic vascular calcification of the carotid siphons. No hyperdense vessel. Skull: Normal. Negative for fracture or focal lesion. Sinuses/Orbits: The bilateral paranasal sinuses and mastoid air cells are clear. The orbits are unremarkable. Other: None. IMPRESSION: Normal noncontrast head CT. Electronically Signed   By: Obie DredgeWilliam T Derry M.D.   On: 11/03/2016 12:56    EKG: Independently reviewed.  Assessment/Plan Active Problems:   Seizures (HCC)   Depression   Anxiety   COPD (chronic obstructive pulmonary disease) (HCC)   Alcohol abuse   Cocaine use   Seizures:  Unclear H/o drug abuse and ETOH. According to her firen she is a "functional alcoholic" and recently used cocaine in the setting of situational depression. UDS + cocaine. ETOH neg   No evidence of infectious or other metabolic process causing seizures. CT head neg. UA neg. Had a second seizure at the ED, receiving IV Ativan. Neuro consulted  Tele obs  Seizure order set      IVF  Other plans as per Neurology, including possible EEG, appreciate their involvement  Hypertension BP   155/93   Pulse 98  , not on home meds  Cannot provide BB due to Cocaine, will monitor   Hyperlipidemia Continue home statins  Alcohol abuse and dependence and at risk for withdrawal   Telemetry   CIWA with Ativan per protocol   Thiamine, folate, and  MVI  Tobacco abuse with risk for nicotine withdrawal   Nicotine patch vs gum and tobacco  cessation counseling  to be offered when patient more awake   COPD without exacerbation Osats normal  Continue nebs and O2 prn   Anxiety and Depression Continue Celexa and  Desyrel  and ativan prn   Hyponatremia  likely due to meds and decreased oral intake, expected to correct with IV hydration  NA 132  IV Normal saline -Repeat BMP in am   Hyperglycemia, in the setting of ETOH  Monitor CBG Check A1C   Social: patient undergoing several social issues, according to the friend, leading her to increase ETOH consumption and recent cocaine use Would consider Psych consult as OP     DVT prophylaxis:  Heparin  Code Status:   Full  Family Communication:  Discussed with patient Disposition Plan: Expect patient to be discharged to home after condition improves Consults called:     Admission status:    Marcos Eke, PA-C [facility]   11/03/2016, 4:24 PM

## 2016-11-03 NOTE — ED Notes (Signed)
Pt ambulated in hall, steady gait.

## 2016-11-03 NOTE — ED Provider Notes (Signed)
[facility]-EMERGENCY DEPT Provider Note   CSN: [id number] Arrival date & time: 11/03/16  1101     History   Chief Complaint Chief Complaint  Patient presents with  . Seizures    HPI Susan Todd is a 51 y.o. female.  HPI   51 year old female with history of alcohol abuse, anxiety, depression, COPD brought here via EMS from home for a new onset seizure. Patient only able to give limited history. Patient admits that she drinks alcohol on a regular basis usually 3-4 beers a day. Does report drinking a 12 pack of beer yesterday. Had a witnessed seizure by her friend today. She was postictal upon arrival. Small amount of blood noted in her lower lip but otherwise she denies any significant headache, neck pain, chest pain, trouble breathing, abdominal pain, back pain, or pain to her extremities. She did not recall having seizure. She does take benzodiazepine as needed, last use today. Denies any recreational drug use or any recent medication changes. History of depression but denies suicidal ideation or homicidal ideation. She denies being depressed. Denies urinary incontinence.  Past Medical History:  Diagnosis Date  . Anxiety   . Arthritis   . Atrial fibrillation (HCC)    "pt. has no knowledge of this" as of 03-07-14  . Carotid artery occlusion   . COPD (chronic obstructive pulmonary disease) (HCC)    Emphysema  . Depression   . Hypertension     Patient Active Problem List   Diagnosis Date Noted  . Shortness of breath-with exertion 09/23/2013  . Occlusion and stenosis of carotid artery without mention of cerebral infarction 08/22/2011  . Depression   . Anxiety   . Arthritis     Past Surgical History:  Procedure Laterality Date  . BUNIONECTOMY     BOTH FEET  . CYST EXCISION PERINEAL     8'03- Dr. Elana Alm    OB History    Gravida Para Term Preterm AB Living   SAB TAB Ectopic Multiple Live Births                   Home Medications    Prior to Admission  medications   Medication Sig Start Date End Date Taking? Authorizing Provider  acetaminophen (TYLENOL) 325 MG tablet Take 650 mg by mouth every 6 (six) hours as needed for mild pain, moderate pain or headache.     [provider]  acyclovir (ZOVIRAX) 400 MG tablet Take 400 mg by mouth 2 (two) times daily as needed (for cold sores).     [provider]  aspirin EC 81 MG tablet Take 81 mg by mouth daily.    [provider]  atorvastatin (LIPITOR) 10 MG tablet Take 10 mg by mouth every evening.     [provider]  cephALEXin (KEFLEX) 500 MG capsule Take 1 capsule (500 mg total) by mouth 2 (two) times daily. 04/04/16   Hedges, Tinnie Gens, PA-C  citalopram (CELEXA) 40 MG tablet Take 40 mg by mouth at bedtime.    [provider]  diclofenac (VOLTAREN) 75 MG EC tablet Take 75 mg by mouth 2 (two) times daily.    [provider]  LORazepam (ATIVAN) 1 MG tablet Take 1 mg by mouth every 8 (eight) hours as needed for anxiety.     [provider]  losartan-hydrochlorothiazide (HYZAAR) 50-12.5 MG tablet Take 1 tablet by mouth at bedtime.    [provider]  methocarbamol (  ROBAXIN) 500 MG tablet Take 250-500 mg by mouth every 6 (six) hours as needed for muscle spasms.     [provider]  traZODone (DESYREL) 50 MG tablet Take 50 mg by mouth at bedtime.    [provider]    Family History Family History  Problem Relation Age of Onset  . Heart disease Mother        NOT  before age 53  . Hyperlipidemia Mother   . Dementia Mother   . Hypertension Mother   . Cancer Father        MELANOMA  . Hyperlipidemia Father   . Hypertension Father   . Cancer Brother        MELANOMA  . Hyperlipidemia Brother   . Hypertension Brother   . Heart disease Brother        Arhymia  . Heart disease Maternal Aunt     Social History Social History  Substance Use Topics  . Smoking status: Current Every Day Smoker    Packs/day: 1.00     Years: 30.00    Types: Cigarettes  . Smokeless tobacco: Never Used     Comment: pt states that she knows she needs to quit but has been stressed and uses cigs as a resort  . Alcohol use Yes     Comment: rare-social     Allergies   Codeine and Latex   Review of Systems Review of Systems  All other systems reviewed and are negative.    Physical Exam Updated Vital Signs SpO2 100%   Physical Exam  Constitutional: She is oriented to person, place, and time. She appears well-developed and well-nourished. No distress.  HENT:  Bruising noted to right lower lip without any deep laceration. Small amount of dried blood noted on chin. Small superficial skin lacerations noted to the right side of tongue. No hemotympanum, no septal hematoma, no malocclusion and no midface tenderness  Eyes: Conjunctivae are normal.  Neck: Normal range of motion. Neck supple.  No tenderness along cervical spine  Cardiovascular: Normal rate, regular rhythm and intact distal pulses.   Pulmonary/Chest: Effort normal and breath sounds normal.  Abdominal: Soft. Bowel sounds are normal. She exhibits no distension. There is no tenderness.  Musculoskeletal:  5 out 5 strength to all 4 extremities  Neurological: She is alert and oriented to person, place, and time. She has normal strength. No cranial nerve deficit or sensory deficit. GCS eye subscore is 4. GCS verbal subscore is 5. GCS motor subscore is 6.  Skin: No rash noted.  Psychiatric: She has a normal mood and affect. Her speech is slurred. She is slowed.  Mildly tremulous  Nursing note and vitals reviewed.    ED Treatments / Results  Labs (all labs ordered are listed, but only abnormal results are displayed) Labs Reviewed  BASIC METABOLIC PANEL - Abnormal; Notable for the following:       Result Value   Sodium 132 (*)    Chloride 95 (*)    Glucose, Bld 129 (*)    BUN <5 (*)    All other components within normal limits  CBC WITH  DIFFERENTIAL/PLATELET - Abnormal; Notable for the following:    MCV 101.2 (*)    MCH 35.3 (*)    All other components within normal limits  ACETAMINOPHEN LEVEL - Abnormal; Notable for the following:    Acetaminophen (Tylenol), Serum <10 (*)    All other components within normal limits  RAPID URINE DRUG SCREEN, HOSP PERFORMED -  Abnormal; Notable for the following:    Cocaine POSITIVE (*)    All other components within normal limits  URINALYSIS, ROUTINE W REFLEX MICROSCOPIC - Abnormal; Notable for the following:    Hgb urine dipstick SMALL (*)    Protein, ur 30 (*)    Bacteria, UA RARE (*)    Squamous Epithelial / LPF 0-5 (*)    All other components within normal limits  CBG MONITORING, ED - Abnormal; Notable for the following:    Glucose-Capillary 156 (*)    All other components within normal limits  ETHANOL  SALICYLATE LEVEL  I-STAT BETA HCG BLOOD, ED ([facility], [facility], [facility] ONLY)    EKG  EKG Interpretation None       Radiology Ct Head Wo Contrast  Result Date: 11/03/2016 CLINICAL DATA:  New onset seizure. EXAM: CT HEAD WITHOUT CONTRAST TECHNIQUE: Contiguous axial images were obtained from the base of the skull through the vertex without intravenous contrast. COMPARISON:  MRI brain dated August 12, 2011. CT head dated November 11, 2005. To the FINDINGS: Brain: No evidence of acute infarction, hemorrhage, hydrocephalus, extra-axial collection or mass lesion/mass effect. Vascular: Atherosclerotic vascular calcification of the carotid siphons. No hyperdense vessel. Skull: Normal. Negative for fracture or focal lesion. Sinuses/Orbits: The bilateral paranasal sinuses and mastoid air cells are clear. The orbits are unremarkable. Other: None. IMPRESSION: Normal noncontrast head CT. Electronically Signed   By: Obie Dredge M.D.   On: 11/03/2016 12:56    Procedures Procedures (including critical care time)  Medications Ordered in ED Medications  sodium chloride 0.9 % bolus 1,000 mL (0 mLs  Intravenous Stopped 11/03/16 1232)    And  0.9 %  sodium chloride infusion (not administered)  LORazepam (ATIVAN) injection 1 mg (not administered)  LORazepam (ATIVAN) injection 2 mg (2 mg Intravenous Given 11/03/16 1514)     Initial Impression / Assessment and Plan / ED Course  I have reviewed the triage vital signs and the nursing notes.  Pertinent labs & imaging results that were available during my care of the patient were reviewed by me and considered in my medical decision making (see chart for details).     BP (!) 164/95   Pulse 96   Temp 98.5 F (36.9 C) (Oral)   Resp (!) 23   Ht  (1.778 m)   Wt 81.6 kg (180 lb)   SpO2 93%   BMI 25.83 kg/m    Final Clinical Impressions(s) / ED Diagnoses   Final diagnoses:  New onset seizure (HCC)  Alcohol abuse  Cocaine abuse    New Prescriptions New Prescriptions   No medications on file   11:17 AM Patient with history of alcohol abuse last drink was last night here for new onset seizure. Does take benzodiazepine on an as-needed basis, last use today. No significant signs of injury from her seizure. Workup initiated.  2:22 PM Patient tested positive for cocaine in which she admits that she did use cocaine along with alcohol. E test is negative, her urine shows no signs of urinary tract infection, her electrolytes panel are reassuring, normal WBC, no anemia and her head CT scan without any acute intracranial abnormalities. I suspect her seizure is likely due to cocaine use and alcohol use. I recommend cessation of illicit drug use and alcohol use and will provide outpatient resources. I also encouraged patient to follow-up with the neurologist for further evaluation of her seizure. Encouraged patient to avoid driving until she is cleared by her neurologist. Return  precaution discussed.  Will have patient ambulated prior to discharge. She is currently clinically sober. Her friend is in the room to assist her.  3:14 PM We had  pt up and ambulating however after return to her room pt had another witnessed seizure episode and now she's in a post ictal state.  Will give ativan.  Appreciate consultation from [facility], and pt will be admitted under the care of Dr. Benjamine Mola.  Since alcohol level is undetectable, perhaps this is alcohol withdrawal seizure.     Fayrene Helper, PA-C 11/03/16 1519    Mackuen, Cindee Salt, MD 11/06/16 1144

## 2016-11-04 ENCOUNTER — Observation Stay (HOSPITAL_COMMUNITY): Payer: Self-pay

## 2016-11-04 DIAGNOSIS — R569 Unspecified convulsions: Secondary | ICD-10-CM

## 2016-11-04 DIAGNOSIS — F1490 Cocaine use, unspecified, uncomplicated: Secondary | ICD-10-CM

## 2016-11-04 DIAGNOSIS — F17210 Nicotine dependence, cigarettes, uncomplicated: Secondary | ICD-10-CM

## 2016-11-04 DIAGNOSIS — Z810 Family history of intellectual disabilities: Secondary | ICD-10-CM

## 2016-11-04 DIAGNOSIS — J439 Emphysema, unspecified: Secondary | ICD-10-CM

## 2016-11-04 DIAGNOSIS — F419 Anxiety disorder, unspecified: Secondary | ICD-10-CM

## 2016-11-04 DIAGNOSIS — J449 Chronic obstructive pulmonary disease, unspecified: Secondary | ICD-10-CM

## 2016-11-04 DIAGNOSIS — F1994 Other psychoactive substance use, unspecified with psychoactive substance-induced mood disorder: Secondary | ICD-10-CM

## 2016-11-04 DIAGNOSIS — I10 Essential (primary) hypertension: Secondary | ICD-10-CM

## 2016-11-04 LAB — GLUCOSE, CAPILLARY
Glucose-Capillary: 90 mg/dL (ref 65–99)
Glucose-Capillary: 108 mg/dL — ABNORMAL HIGH (ref 65–99)
Glucose-Capillary: 162 mg/dL — ABNORMAL HIGH (ref 65–99)

## 2016-11-04 LAB — CBC
HCT: 43.1 % (ref 36.0–46.0)
Hemoglobin: 14.2 g/dL (ref 12.0–15.0)
MCH: 34.2 pg — ABNORMAL HIGH (ref 26.0–34.0)
MCHC: 32.9 g/dL (ref 30.0–36.0)
MCV: 103.9 fL — ABNORMAL HIGH (ref 78.0–100.0)
Platelets: 154 10*3/uL (ref 150–400)
RBC: 4.15 MIL/uL (ref 3.87–5.11)
RDW: 13.4 % (ref 11.5–15.5)
WBC: 6.8 10*3/uL (ref 4.0–10.5)

## 2016-11-04 LAB — COMPREHENSIVE METABOLIC PANEL
ALT: 15 U/L (ref 14–54)
AST: 37 U/L (ref 15–41)
Albumin: 3.8 g/dL (ref 3.5–5.0)
Alkaline Phosphatase: 86 U/L (ref 38–126)
Anion gap: 10 (ref 5–15)
BUN: <5 mg/dL — ABNORMAL LOW (ref 6–20)
CO2: 24 mmol/L (ref 22–32)
Calcium: 9.3 mg/dL (ref 8.9–10.3)
Chloride: 102 mmol/L (ref 101–111)
Creatinine, Ser: 0.66 mg/dL (ref 0.44–1.00)
GFR calc Af Amer: >60 mL/min (ref >60)
GFR calc non Af Amer: >60 mL/min (ref >60)
Glucose, Bld: 92 mg/dL (ref 65–99)
Potassium: 3.1 mmol/L — ABNORMAL LOW (ref 3.5–5.1)
Sodium: 136 mmol/L (ref 135–145)
Total Bilirubin: 1 mg/dL (ref 0.3–1.2)
Total Protein: 6.3 g/dL — ABNORMAL LOW (ref 6.5–8.1)

## 2016-11-04 LAB — TROPONIN I: Troponin I: <0.03 ng/mL (ref <0.03)

## 2016-11-04 LAB — HIV ANTIBODY (ROUTINE TESTING W REFLEX): HIV Screen 4th Generation wRfx: NONREACTIVE

## 2016-11-04 MED ORDER — GADOBENATE DIMEGLUMINE 529 MG/ML IV SOLN
15.0000 mL | Freq: Once | INTRAVENOUS | Status: AC
  Administered 2016-11-04: 15 mL via INTRAVENOUS

## 2016-11-04 MED ORDER — ORAL CARE MOUTH RINSE
15.0000 mL | Freq: Two times a day (BID) | OROMUCOSAL | Status: DC
  Administered 2016-11-04 – 2016-11-06 (×5): 15 mL via OROMUCOSAL

## 2016-11-04 MED ORDER — POTASSIUM CHLORIDE CRYS ER 20 MEQ PO TBCR
40.0000 meq | EXTENDED_RELEASE_TABLET | Freq: Two times a day (BID) | ORAL | Status: AC
  Administered 2016-11-04 (×2): 40 meq via ORAL
  Filled 2016-11-04 (×2): qty 2

## 2016-11-04 NOTE — Procedures (Signed)
ELECTROENCEPHALOGRAM REPORT  Date of Study: 11/04/2016  Patient's Name: FATE GALANTI MRN: [id number] Date of Birth: December 20, 1965  Referring Provider: Dr. Caryl Pina  Clinical History: This is a 51 year old woman with new onset seizure  Medications: acetaminophen (TYLENOL) tablet 650 mg  aspirin EC tablet 81 mg  atorvastatin (LIPITOR) tablet 10 mg  bisacodyl (DULCOLAX) suppository 10 mg  citalopram (CELEXA) tablet 40 mg  folic acid (FOLVITE) tablet 1 mg  heparin injection 5,000 Units  HYDROcodone-acetaminophen (NORCO/VICODIN) 5-325 MG per tablet 1-2 tablet  LORazepam (ATIVAN) injection 1 mg  LORazepam (ATIVAN) injection 1 mg  LORazepam (ATIVAN) tablet 1 mg  MEDLINE mouth rinse  multivitamin with minerals tablet 1 tablet  ondansetron (ZOFRAN) injection 4 mg  ondansetron (ZOFRAN) tablet 4 mg  potassium chloride SA (K-DUR,KLOR-CON) CR tablet 40 mEq  senna-docusate (Senokot-S) tablet 1 tablet  thiamine (B-1) injection 100 mg  thiamine (VITAMIN B-1) tablet 100 mg  traZODone (DESYREL) tablet 50 mg   Technical Summary: A multichannel digital EEG recording measured by the international 10-20 system with electrodes applied with paste and impedances below 5000 ohms performed in our laboratory with EKG monitoring in an awake and drowsy patient.  Hyperventilation was not completed. Photic stimulation was performed.  The digital EEG was referentially recorded, reformatted, and digitally filtered in a variety of bipolar and referential montages for optimal display.    Description: The patient is awake and drowsy during the recording.  During maximal wakefulness, there is a symmetric, medium voltage 8.5-9 Hz posterior dominant rhythm that attenuates with eye opening.  The record is symmetric.  During drowsiness, there is an increase in theta slowing of the background. Deeper stages of sleep were not seen. Photic stimulation did not elicit any abnormalities.  There were no epileptiform discharges  or electrographic seizures seen.    EKG lead was unremarkable.  Impression: This awake and drowsy EEG is normal.    Clinical Correlation: A normal EEG does not exclude a clinical diagnosis of epilepsy.  Clinical correlation is advised.   Patrcia Dolly, M.D.

## 2016-11-04 NOTE — Plan of Care (Signed)
Problem: Safety: Goal: Ability to remain free from injury will improve Outcome: Progressing Seizure precautions and fall prevention in place.

## 2016-11-04 NOTE — Progress Notes (Signed)
EEG Completed; Results Pending  

## 2016-11-04 NOTE — Consult Note (Signed)
[place] [facility] Consult   Reason for Consult:  Polysubstance abuse and s/p seizure Referring Physician:  Dr. Karleen Hampshire Patient Identification: Susan Todd MRN:  [id number] Principal Diagnosis: <principal problem not specified> Diagnosis:   Patient Active Problem List   Diagnosis Date Noted  . Seizures ([place]) [R56.9] 11/03/2016  . COPD (chronic obstructive pulmonary disease) ([place]) [J44.9] 11/03/2016  . Alcohol abuse [F10.10] 11/03/2016  . Cocaine use [F14.90] 11/03/2016  . Shortness of breath-with exertion [R06.02] 09/23/2013  . Occlusion and stenosis of carotid artery without mention of cerebral infarction [I65.29] 08/22/2011  . Depression [F32.9]   . Anxiety [F41.9]   . Arthritis [M19.90]     Total Time spent with patient: 1 hour  Subjective:   Susan Todd is a 51 y.o. female patient admitted with substance abuse and s/p seizure.  HPI:  Susan Todd is a 51 y.o. female with medical history significant for anxiety, depression, COPD, HTN, chronic ETOH abuse, and recent cocaine abuse, brought by EMS after patient experienced an episode of seizure while at home. Patient seen, chart reviewed for this face-to-face psychiatry consultation and evaluation of alcohol and cocaine abuse and status post seizure activity.Patient minimizes her drinking saying that she's been drinking only 3 or 4 beers a day and make a friend and had a cocaine. Patient also reported she has no health insurance at this time but she was seen by psychiatric provider at [facility]. Patient denied current symptoms of depression, anxiety, suicidal/homicidal ideation, no evidence of psychosis. Patient is willing to participate in substance abuse rehabilitation when she is accepted by the program. Patient has been contracting for safety during this hospitalization.patient urine drug screen is positive for cocaine and blood alcohol level is less than 5.  Past Psychiatric History:  Anxiety,depression, chronic ETOH abuse, and recent cocaine abuse  Risk to Self: Is patient at risk for suicide?: No Risk to Others:   Prior Inpatient Therapy:   Prior Outpatient Therapy:    Past Medical History:  Past Medical History:  Diagnosis Date  . Anxiety   . Arthritis   . Atrial fibrillation ([place])    "pt. has no knowledge of this" as of 03-07-14  . Carotid artery occlusion   . COPD (chronic obstructive pulmonary disease) (HCC)    Emphysema  . Depression   . Hypertension     Past Surgical History:  Procedure Laterality Date  . BUNIONECTOMY     BOTH FEET  . CYST EXCISION PERINEAL     8'03- Dr. Ree Edman   Family History:  Family History  Problem Relation Age of Onset  . Heart disease Mother        NOT  before age 39  . Hyperlipidemia Mother   . Dementia Mother   . Hypertension Mother   . Cancer Father        MELANOMA  . Hyperlipidemia Father   . Hypertension Father   . Cancer Brother        MELANOMA  . Hyperlipidemia Brother   . Hypertension Brother   . Heart disease Brother        Arhymia  . Heart disease Maternal Aunt    Family Psychiatric  History: unknown Social History:  History  Alcohol Use  . Yes    Comment: rare-social     History  Drug Use No    Comment: abused drugs in the past 1989    Social History   Social History  . Marital status: Single  Spouse name: N/A  . Number of children: N/A  . Years of education: N/A   Social History Main Topics  . Smoking status: Current Every Day Smoker    Packs/day: 1.00    Years: 30.00    Types: Cigarettes  . Smokeless tobacco: Never Used     Comment: pt states that she knows she needs to quit but has been stressed and uses cigs as a resort  . Alcohol use Yes     Comment: rare-social  . Drug use: No     Comment: abused drugs in the past 1989  . Sexual activity: Not Currently    Birth control/ protection: None   Other Topics Concern  . Not on file   Social History Narrative  . No  narrative on file   Additional Social History:    Allergies:   Allergies  Allergen Reactions  . Codeine Nausea And Vomiting  . Latex Hives    Labs:  Results for orders placed or performed during the hospital encounter of 11/03/16 (from the past 48 hour(s))  Acetaminophen level     Status: Abnormal   Collection Time: 11/03/16 11:11 AM  Result Value Ref Range   Acetaminophen (Tylenol), Serum <10 (L) 10 - 30 ug/mL    Comment:        THERAPEUTIC CONCENTRATIONS VARY SIGNIFICANTLY. A RANGE OF 10-30 ug/mL MAY BE AN EFFECTIVE CONCENTRATION FOR MANY PATIENTS. HOWEVER, SOME ARE BEST TREATED AT CONCENTRATIONS OUTSIDE THIS RANGE. ACETAMINOPHEN CONCENTRATIONS >150 ug/mL AT 4 HOURS AFTER INGESTION AND >50 ug/mL AT 12 HOURS AFTER INGESTION ARE OFTEN ASSOCIATED WITH TOXIC REACTIONS.   Ethanol/ETOH     Status: None   Collection Time: 11/03/16 11:11 AM  Result Value Ref Range   Alcohol, Ethyl (B) <5 <5 mg/dL    Comment:        LOWEST DETECTABLE LIMIT FOR SERUM ALCOHOL IS 5 mg/dL FOR MEDICAL PURPOSES ONLY   Salicylate level     Status: None   Collection Time: 11/03/16 11:11 AM  Result Value Ref Range   Salicylate Lvl <5.6 2.8 - 30.0 mg/dL  CBG monitoring, ED     Status: Abnormal   Collection Time: 11/03/16 11:24 AM  Result Value Ref Range   Glucose-Capillary 156 (H) 65 - 99 mg/dL  Basic metabolic panel     Status: Abnormal   Collection Time: 11/03/16 11:45 AM  Result Value Ref Range   Sodium 132 (L) 135 - 145 mmol/L   Potassium 3.5 3.5 - 5.1 mmol/L   Chloride 95 (L) 101 - 111 mmol/L   CO2 25 22 - 32 mmol/L   Glucose, Bld 129 (H) 65 - 99 mg/dL   BUN <5 (L) 6 - 20 mg/dL   Creatinine, Ser 0.60 0.44 - 1.00 mg/dL   Calcium 9.4 8.9 - 10.3 mg/dL   GFR calc non Af Amer >60 >60 mL/min   GFR calc Af Amer >60 >60 mL/min    Comment: (NOTE) The eGFR has been calculated using the CKD EPI equation. This calculation has not been validated in all clinical situations. eGFR's persistently  <60 mL/min signify possible Chronic Kidney Disease.    Anion gap 12 5 - 15  CBC WITH DIFFERENTIAL     Status: Abnormal   Collection Time: 11/03/16 11:45 AM  Result Value Ref Range   WBC 8.9 4.0 - 10.5 K/uL   RBC 4.11 3.87 - 5.11 MIL/uL   Hemoglobin 14.5 12.0 - 15.0 g/dL   HCT 41.6 36.0 - 46.0 %  MCV 101.2 (H) 78.0 - 100.0 fL   MCH 35.3 (H) 26.0 - 34.0 pg   MCHC 34.9 30.0 - 36.0 g/dL   RDW 13.4 11.5 - 15.5 %   Platelets 169 150 - 400 K/uL   Neutrophils Relative % 74 %   Neutro Abs 6.6 1.7 - 7.7 K/uL   Lymphocytes Relative 17 %   Lymphs Abs 1.6 0.7 - 4.0 K/uL   Monocytes Relative 8 %   Monocytes Absolute 0.7 0.1 - 1.0 K/uL   Eosinophils Relative 1 %   Eosinophils Absolute 0.1 0.0 - 0.7 K/uL   Basophils Relative 0 %   Basophils Absolute 0.0 0.0 - 0.1 K/uL  Hemoglobin A1c     Status: None   Collection Time: 11/03/16 11:45 AM  Result Value Ref Range   Hgb A1c MFr Bld 5.1 4.8 - 5.6 %    Comment: (NOTE) Pre diabetes:          5.7%-6.4% Diabetes:              >6.4% Glycemic control for   <7.0% adults with diabetes    Mean Plasma Glucose 99.67 mg/dL  Urine rapid drug screen (hosp performed)     Status: Abnormal   Collection Time: 11/03/16 11:47 AM  Result Value Ref Range   Opiates NONE DETECTED NONE DETECTED   Cocaine POSITIVE (A) NONE DETECTED   Benzodiazepines NONE DETECTED NONE DETECTED   Amphetamines NONE DETECTED NONE DETECTED   Tetrahydrocannabinol NONE DETECTED NONE DETECTED   Barbiturates NONE DETECTED NONE DETECTED    Comment:        DRUG SCREEN FOR MEDICAL PURPOSES ONLY.  IF CONFIRMATION IS NEEDED FOR ANY PURPOSE, NOTIFY LAB WITHIN 5 DAYS.        LOWEST DETECTABLE LIMITS FOR URINE DRUG SCREEN Drug Class       Cutoff (ng/mL) Amphetamine      1000 Barbiturate      200 Benzodiazepine   982 Tricyclics       641 Opiates          300 Cocaine          300 THC              50   Urinalysis, Routine w reflex microscopic     Status: Abnormal   Collection Time:  11/03/16 11:47 AM  Result Value Ref Range   Color, Urine YELLOW YELLOW   APPearance CLEAR CLEAR   Specific Gravity, Urine 1.005 1.005 - 1.030   pH 5.0 5.0 - 8.0   Glucose, UA NEGATIVE NEGATIVE mg/dL   Hgb urine dipstick SMALL (A) NEGATIVE   Bilirubin Urine NEGATIVE NEGATIVE   Ketones, ur NEGATIVE NEGATIVE mg/dL   Protein, ur 30 (A) NEGATIVE mg/dL   Nitrite NEGATIVE NEGATIVE   Leukocytes, UA NEGATIVE NEGATIVE   RBC / HPF 0-5 0 - 5 RBC/hpf   WBC, UA 0-5 0 - 5 WBC/hpf   Bacteria, UA RARE (A) NONE SEEN   Squamous Epithelial / LPF 0-5 (A) NONE SEEN   Mucus PRESENT   I-Stat beta hCG blood, ED     Status: None   Collection Time: 11/03/16 11:54 AM  Result Value Ref Range   I-stat hCG, quantitative <5.0 <5 mIU/mL   Comment 3            Comment:   GEST. AGE      CONC.  (mIU/mL)   <=1 WEEK        5 - 50  2 WEEKS       50 - 500     3 WEEKS       100 - 10,000     4 WEEKS     1,000 - 30,000        FEMALE AND NON-PREGNANT FEMALE:     LESS THAN 5 mIU/mL   Troponin I     Status: None   Collection Time: 11/03/16  3:48 PM  Result Value Ref Range   Troponin I <0.03 <0.03 ng/mL  HIV antibody (Routine Testing)     Status: None   Collection Time: 11/03/16  3:48 PM  Result Value Ref Range   HIV Screen 4th Generation wRfx Non Reactive Non Reactive    Comment: (NOTE) Performed At: [facility] [place], [place] [id number] Lindon Romp MD IZ:[id number]   CBG monitoring, ED     Status: Abnormal   Collection Time: 11/03/16  6:36 PM  Result Value Ref Range   Glucose-Capillary 122 (H) 65 - 99 mg/dL  Troponin I     Status: None   Collection Time: 11/03/16  9:12 PM  Result Value Ref Range   Troponin I <0.03 <0.03 ng/mL  Troponin I     Status: None   Collection Time: 11/04/16  3:58 AM  Result Value Ref Range   Troponin I <0.03 <0.03 ng/mL  Comprehensive metabolic panel     Status: Abnormal   Collection Time: 11/04/16  3:58 AM  Result Value Ref Range   Sodium 136  135 - 145 mmol/L   Potassium 3.1 (L) 3.5 - 5.1 mmol/L   Chloride 102 101 - 111 mmol/L   CO2 24 22 - 32 mmol/L   Glucose, Bld 92 65 - 99 mg/dL   BUN <5 (L) 6 - 20 mg/dL   Creatinine, Ser 0.66 0.44 - 1.00 mg/dL   Calcium 9.3 8.9 - 10.3 mg/dL   Total Protein 6.3 (L) 6.5 - 8.1 g/dL   Albumin 3.8 3.5 - 5.0 g/dL   AST 37 15 - 41 U/L   ALT 15 14 - 54 U/L   Alkaline Phosphatase 86 38 - 126 U/L   Total Bilirubin 1.0 0.3 - 1.2 mg/dL   GFR calc non Af Amer >60 >60 mL/min   GFR calc Af Amer >60 >60 mL/min    Comment: (NOTE) The eGFR has been calculated using the CKD EPI equation. This calculation has not been validated in all clinical situations. eGFR's persistently <60 mL/min signify possible Chronic Kidney Disease.    Anion gap 10 5 - 15  CBC     Status: Abnormal   Collection Time: 11/04/16  3:58 AM  Result Value Ref Range   WBC 6.8 4.0 - 10.5 K/uL   RBC 4.15 3.87 - 5.11 MIL/uL   Hemoglobin 14.2 12.0 - 15.0 g/dL   HCT 43.1 36.0 - 46.0 %   MCV 103.9 (H) 78.0 - 100.0 fL   MCH 34.2 (H) 26.0 - 34.0 pg   MCHC 32.9 30.0 - 36.0 g/dL   RDW 13.4 11.5 - 15.5 %   Platelets 154 150 - 400 K/uL  Glucose, capillary     Status: None   Collection Time: 11/04/16  8:21 AM  Result Value Ref Range   Glucose-Capillary 90 65 - 99 mg/dL    Current Facility-Administered Medications  Medication Dose Route Frequency Provider Last Rate Last Dose  . 0.9 %  sodium chloride infusion   Intravenous Continuous Rondel Jumbo, PA-C 100 mL/hr  at 11/04/16 0658    . acetaminophen (TYLENOL) tablet 650 mg  650 mg Oral Q6H PRN Rondel Jumbo, PA-C      . aspirin EC tablet 81 mg  81 mg Oral Daily Rondel Jumbo, PA-C   81 mg at 11/04/16 1008  . atorvastatin (LIPITOR) tablet 10 mg  10 mg Oral QPM Rondel Jumbo, PA-C   10 mg at 11/03/16 2108  . bisacodyl (DULCOLAX) suppository 10 mg  10 mg Rectal Daily PRN Rondel Jumbo, PA-C      . citalopram (CELEXA) tablet 40 mg  40 mg Oral QHS Rondel Jumbo, PA-C   40 mg at  11/03/16 2108  . folic acid (FOLVITE) tablet 1 mg  1 mg Oral Daily Rondel Jumbo, PA-C   1 mg at 11/04/16 1008  . heparin injection 5,000 Units  5,000 Units Subcutaneous Q8H Rondel Jumbo, PA-C   5,000 Units at 11/04/16 9532  . HYDROcodone-acetaminophen (NORCO/VICODIN) 5-325 MG per tablet 1-2 tablet  1-2 tablet Oral Q4H PRN Rondel Jumbo, PA-C      . LORazepam (ATIVAN) injection 1 mg  1 mg Intravenous Once Mackuen, Courteney Lyn, MD   Stopped at 11/03/16 1544  . LORazepam (ATIVAN) tablet 1 mg  1 mg Oral Q6H PRN Rondel Jumbo, PA-C   1 mg at 11/04/16 0016   Or  . LORazepam (ATIVAN) injection 1 mg  1 mg Intravenous Q6H PRN Rondel Jumbo, PA-C      . MEDLINE mouth rinse  15 mL Mouth Rinse BID Vann, Jessica U, DO   15 mL at 11/04/16 1000  . multivitamin with minerals tablet 1 tablet  1 tablet Oral Daily Rondel Jumbo, PA-C   1 tablet at 11/04/16 1008  . ondansetron (ZOFRAN) tablet 4 mg  4 mg Oral Q6H PRN Rondel Jumbo, PA-C       Or  . ondansetron (ZOFRAN) injection 4 mg  4 mg Intravenous Q6H PRN Sharene Butters E, PA-C      . potassium chloride SA (K-DUR,KLOR-CON) CR tablet 40 mEq  40 mEq Oral BID Hosie Poisson, MD   40 mEq at 11/04/16 1008  . senna-docusate (Senokot-S) tablet 1 tablet  1 tablet Oral QHS PRN Rondel Jumbo, PA-C      . thiamine (VITAMIN B-1) tablet 100 mg  100 mg Oral Daily Rondel Jumbo, PA-C   100 mg at 11/04/16 1008   Or  . thiamine (B-1) injection 100 mg  100 mg Intravenous Daily Wertman, Sara E, PA-C      . traZODone (DESYREL) tablet 50 mg  50 mg Oral QHS Rondel Jumbo, PA-C   50 mg at 11/03/16 2109    Musculoskeletal: Strength & Muscle Tone: within normal limits Gait & Station: unable to stand Patient leans: N/A  Psychiatric Specialty Exam: Physical Exam as per history and physical  ROS denied nausea, vomiting, shaking, sweating, abdominal pain, shortness of breath and chest pain. No Fever-chills, No Headache, No changes with Vision or hearing,  reports vertigo No problems swallowing food or Liquids, No Chest pain, Cough or Shortness of Breath, No Abdominal pain, No Nausea or Vommitting, Bowel movements are regular, No Blood in stool or Urine, No dysuria, No new skin rashes or bruises, No new joints pains-aches,  No new weakness, tingling, numbness in any extremity, No recent weight gain or loss, No polyuria, polydypsia or polyphagia,  A full 10 point Review of Systems was done, except as stated above,  all other Review of Systems were negative.  Blood pressure 139/79, pulse 77, temperature 99.1 F (37.3 C), temperature source Oral, resp. rate 18, height '5\' 10"'  (1.778 m), weight 71.2 kg (156 lb 15.5 oz), SpO2 98 %.Body mass index is 22.52 kg/m.  General Appearance: Casual  Eye Contact:  Good  Speech:  Clear and Coherent  Volume:  Normal  Mood:  Euthymic  Affect:  Constricted and Depressed  Thought Process:  Coherent and Goal Directed  Orientation:  Full (Time, Place, and Person)  Thought Content:  WDL  Suicidal Thoughts:  No  Homicidal Thoughts:  No  Memory:  Immediate;   Good Recent;   Fair Remote;   Fair  Judgement:  Impaired  Insight:  Fair  Psychomotor Activity:  Normal  Concentration:  Concentration: Good and Attention Span: Good  Recall:  Good  Fund of Knowledge:  Good  Language:  Negative  Akathisia:  Negative  Handed:  Right  AIMS (if indicated):     Assets:  Communication Skills Desire for Improvement Housing Intimacy Physical Health Resilience Social Support Talents/Skills Transportation  ADL's:  Intact  Cognition:  WNL  Sleep:        Treatment Plan Summary: patient has a history of depression, anxiety and polysubstance abuse especially cocaine and alcohol. Patient denies current suicidal/homicidal ideation, intention or plans. Patient willing to participate outpatient psychiatric medication management. Patient is also open for the substance abuse rehabilitation treatment program.  Patient  has no safety concerns Patient will be referred to the CSW for residential substance abuse treatment program especially at [facility] recovery services when medically stable.  Continue current psychotropic medications including Celexa, trazodone and Ativan Monitor for alcohol withdrawal symptoms and seizures Daily contact with patient to assess and evaluate symptoms and progress in treatment and Medication management  Disposition: No evidence of imminent risk to self or others at present.   Patient does not meet criteria for psychiatric inpatient admission. Supportive therapy provided about ongoing stressors.  Ambrose Finland, MD 11/04/2016 11:51 AM

## 2016-11-04 NOTE — Progress Notes (Signed)
PROGRESS NOTE    Susan Todd  ZOX:[id number] DOB: 18-Jan-1966 DOA: 11/03/2016 PCP: Georgann Housekeeper, MD    Brief Narrative:  Susan Todd is a 51 y.o. female with a Past Medical History of alcohol abuse, depression-- comes in for witnessed seizure.   Assessment & Plan:   Active Problems:   Depression   Anxiety   Seizures (H[facility])   COPD (chronic obstructive pulmonary disease) (HCC)   Alcohol abuse   Cocaine use   Seizures:  Probably from alcohol withdrawal. On ativan taper. MRI brain ordered by neurology.  EEG was normal.  Discussed the results with the patient.     Depression:  Not suicidal.    Copd: no wheezing heard. Stable.    Cocaine use:  Cessation counseled.  Psychiatry consulted for depression and substance abuse.    Alcohol withdrawal: On CIWA. Hydrate and monitor.    DVT prophylaxis:sq heparin.  Code Status: full code.  Family Communication: family at bedside.  Disposition Plan: pending further eval by neurology and psychiatry.    Consultants:   Neurology  Psychiatry.   Procedures: EEG   Antimicrobials: none.    Subjective: Reports her lower lip is hurting.   Objective: Vitals:   11/03/16 2023 11/04/16 0000 11/04/16 0500 11/04/16 0522  BP: (!) 157/96 (!) 156/81  139/79  Pulse: 98 70  77  Resp: Temp: 99.3 F (37.4 C) 99 F (37.2 C)  99.1 F (37.3 C)  TempSrc: Oral Oral  Oral  SpO2: 96% 95%  98%  Weight: 79 kg (174 lb 2.6 oz)  71.2 kg (156 lb 15.5 oz)   Height:  (1.778 m)       Intake/Output Summary (Last 24 hours) at 11/04/16 1606 Last data filed at 11/04/16 0600  Gross per 24 hour  Intake             1420 ml  Output                0 ml  Net             1420 ml   Filed Weights   11/03/16 1120 11/03/16 2023 11/04/16 0500  Weight: 81.6 kg (180 lb) 79 kg (174 lb 2.6 oz) 71.2 kg (156 lb 15.5 oz)    Examination:  General exam: Appears calm and comfortable , with bruised lower lip from biting during the seizure.   Respiratory system: Clear to auscultation. Respiratory effort normal. Cardiovascular system: S1 & S2 heard, RRR. No JVD, murmurs, rubs, gallops or clicks. No pedal edema. Gastrointestinal system: Abdomen is nondistended, soft and nontender. No organomegaly or masses felt. Normal bowel sounds heard. Central nervous system: Alert and oriented. No focal neurological deficits. Extremities: Symmetric 5 x 5 power. Skin: No rashes, lesions or ulcers Psychiatry: Judgement and insight appear normal. Mood & affect appropriate.     Data Reviewed: I have personally reviewed following labs and imaging studies  CBC:  Recent Labs Lab 11/03/16 1145 11/04/16 0358  WBC 8.9 6.8  NEUTROABS 6.6  --   HGB 14.5 14.2  HCT 41.6 43.1  MCV 101.2* 103.9*  PLT 169 154   Basic Metabolic Panel:  Recent Labs Lab 11/03/16 1145 11/04/16 0358  NA 132* 136  K 3.5 3.1*  CL 95* 102  CO2 25JAMARIAH Todd 129* 92  BUN <5* <5*  CREATININE 0.60 0.66  CALCIUM 9.4 9.3   GFR: Estimated Creatinine Clearance: 87.9 mL/min (by C-G formula based on SCr  of 0.66 mg/dL). Liver Function Tests:  Recent Labs Lab 11/04/16 0358  AST 37  ALT 15  ALKPHOS 86  BILITOT 1.0  PROT 6.3*  ALBUMIN 3.8   No results for input(s): LIPASE, AMYLASE in the last 168 hours. No results for input(s): AMMONIA in the last 168 hours. Coagulation Profile: No results for input(s): INR, PROTIME in the last 168 hours. Cardiac Enzymes:  Recent Labs Lab 11/03/16 1548 11/03/16 2112 11/04/16 0358  TROPONINI <0.03 <0.03 <0.03   BNP (last 3 results) No results for input(s): PROBNP in the last 8760 hours. HbA1C:  Recent Labs  11/03/16 1145  HGBA1C 5.1   CBG:  Recent Labs Lab 11/03/16 1124 11/03/16 1836 11/04/16 0821 11/04/16 1255  GLUCAP 156* 122* 90 108*   Lipid Profile: No results for input(s): CHOL, HDL, LDLCALC, TRIG, CHOLHDL, LDLDIRECT in the last 72 hours. Thyroid Function Tests: No results for input(s): TSH,  T4TOTAL, FREET4, T3FREE, THYROIDAB in the last 72 hours. Anemia Panel: No results for input(s): VITAMINB12, FOLATE, FERRITIN, TIBC, IRON, RETICCTPCT in the last 72 hours. Sepsis Labs: No results for input(s): PROCALCITON, LATICACIDVEN in the last 168 hours.  No results found for this or any previous visit (from the past 240 hour(s)).       Radiology Studies: Ct Head Wo Contrast  Result Date: 11/03/2016 CLINICAL DATA:  New onset seizure. EXAM: CT HEAD WITHOUT CONTRAST TECHNIQUE: Contiguous axial images were obtained from the base of the skull through the vertex without intravenous contrast. COMPARISON:  MRI brain dated August 12, 2011. CT head dated November 11, 2005. To the FINDINGS: Brain: No evidence of acute infarction, hemorrhage, hydrocephalus, extra-axial collection or mass lesion/mass effect. Vascular: Atherosclerotic vascular calcification of the carotid siphons. No hyperdense vessel. Skull: Normal. Negative for fracture or focal lesion. Sinuses/Orbits: The bilateral paranasal sinuses and mastoid air cells are clear. The orbits are unremarkable. Other: None. IMPRESSION: Normal noncontrast head CT. Electronically Signed   By: Obie Dredge M.D.   On: 11/03/2016 12:56        Scheduled Meds: . aspirin EC  81 mg Oral Daily  . atorvastatin  10 mg Oral QPM  . citalopram  40 mg Oral QHS  . folic acid  1 mg Oral Daily  . heparin  5,000 Units Subcutaneous Q8H  . LORazepam  1 mg Intravenous Once  . mouth rinse  15 mL Mouth Rinse BID  . multivitamin with minerals  1 tablet Oral Daily  . potassium chloride  40 mEq Oral BID  . thiamine  100 mg Oral Daily   Or  . thiamine  100 mg Intravenous Daily  . traZODone  50 mg Oral QHS   Continuous Infusions: . sodium chloride 100 mL/hr at 11/04/16 1434     LOS: 0 days    Time spent: 35 minutes.     Kathlen Mody, MD [facility] Pager [phone]  If 7PM-7AM, please contact night-coverage [email or web address] Password  [email or web address] 11/04/2016, 4:06 PM

## 2016-11-05 LAB — GLUCOSE, CAPILLARY
Glucose-Capillary: 90 mg/dL (ref 65–99)
Glucose-Capillary: 120 mg/dL — ABNORMAL HIGH (ref 65–99)

## 2016-11-05 LAB — MAGNESIUM: Magnesium: 1.7 mg/dL (ref 1.7–2.4)

## 2016-11-05 LAB — POTASSIUM: Potassium: 3.2 mmol/L — ABNORMAL LOW (ref 3.5–5.1)

## 2016-11-05 MED ORDER — POTASSIUM CHLORIDE CRYS ER 20 MEQ PO TBCR
40.0000 meq | EXTENDED_RELEASE_TABLET | Freq: Two times a day (BID) | ORAL | Status: AC
  Administered 2016-11-05 (×2): 40 meq via ORAL
  Filled 2016-11-05 (×2): qty 2

## 2016-11-05 MED ORDER — LOSARTAN POTASSIUM 50 MG PO TABS
25.0000 mg | ORAL_TABLET | Freq: Every day | ORAL | Status: DC
  Administered 2016-11-05 – 2016-11-06 (×2): 25 mg via ORAL
  Filled 2016-11-05 (×2): qty 1

## 2016-11-05 MED ORDER — MAGNESIUM OXIDE 400 (241.3 MG) MG PO TABS
400.0000 mg | ORAL_TABLET | Freq: Two times a day (BID) | ORAL | Status: DC
  Administered 2016-11-05 – 2016-11-06 (×3): 400 mg via ORAL
  Filled 2016-11-05 (×3): qty 1

## 2016-11-05 NOTE — Progress Notes (Signed)
EEG came back normal.   MRI brain reveals no acute intracranial abnormality. Mild cerebral white matter changes, nonspecific, but suspected to be related to chronic microvascular ischemic disease. Changes are mildly progressed relative to 2013.  A/R: 51 year old female with first time seizure, most likely secondary to EtOH withdrawal 1. EEG negative. 2. MRI unrevealing 3. CIWA protocol and alcohol cessation counseling 4. Neurology will sign off. Please call the Neurohospitalist service if there are additional questions.   Electronically signed: Dr. Caryl Pina

## 2016-11-05 NOTE — Progress Notes (Signed)
PROGRESS NOTE    Susan Todd  ZOX:[id number] DOB: 31-Mar-1965 DOA: 11/03/2016 PCP: Georgann Housekeeper, MD    Brief Narrative:  Susan Todd is a 51 y.o. female with a Past Medical History of alcohol abuse, depression-- comes in for witnessed seizure. Today she is little jittery, and anxious. Not agitated.   Assessment & Plan:   Active Problems:   Depression   Anxiety   Seizures (HCC)   COPD (chronic obstructive pulmonary disease) (HCC)   Alcohol abuse   Cocaine use   Seizure (HCC)   Seizures:  Probably from alcohol withdrawal. On ativan taper. MRI brain ordered by neurology, its unremarkable.  EEG was normal.  Discussed the results with the patient.  Neurology signed off. Recommend outpatient follow up with services for substance abuse.    Depression:  Not suicidal. Appreciate psychiatry recommendations.    Copd: no wheezing heard. Stable.    Cocaine use:  Cessation counseled.  Psychiatry consulted for depression and substance abuse. Resources given.    Alcohol withdrawal: some tremors today, and she appears slightly anxious.  On CIWA. Hydrate and monitor.  Monitor another 24 hours and possible d/c in am.   Hypokalemia and hypomagnesemia replaced.     DVT prophylaxis:sq heparin.  Code Status: full code.  Family Communication: none at bedside.  Disposition Plan: home in am.    Consultants:   Neurology  Psychiatry.   Procedures: EEG   Antimicrobials: none.    Subjective: Her lower lip swelling improved.   Objective: Vitals:   11/04/16 0522 11/04/16 2138 11/05/16 0423 11/05/16 0424  BP: 139/79 (!) 162/88  (!) 155/79  Pulse: 77 83  (!) 57  Resp: Temp: 99.1 F (37.3 C) 98.8 F (37.1 C)  98.7 F (37.1 C)  TempSrc: Oral Oral  Oral  SpO2: 98% 97%  98%  Weight:   72.9 kg (160 lb 11.5 oz)   Height:        Intake/Output Summary (Last 24 hours) at 11/05/16 1434 Last data filed at 11/05/16 0600  Gross per 24 hour  Intake          2166.67  ml  Output                0 ml  Net          2166.67 ml   Filed Weights   11/03/16 2023 11/04/16 0500 11/05/16 0423  Weight: 79 kg (174 lb 2.6 oz) 71.2 kg (156 lb 15.5 oz) 72.9 kg (160 lb 11.5 oz)    Examination:  General exam: appeard anxious today.  , with bruised lower lip from biting during the seizure. The swelling of the lower lip improved.  Respiratory system: Clear to auscultation. Respiratory effort normal. No wheezing or rhonchi.  Cardiovascular system: S1 & S2 heard, regular rate, no murmer, no JVD. No pedal edema.  Gastrointestinal system: Abdomen is soft non distended, bowel sounds good. No tenderness.  Central nervous system: Alert and oriented. No focal neurological deficits. Extremities: Symmetric 5 x 5 power. Skin: No rashes, lesions or ulcers Psychiatry:slightly anxious but  Mood & affect appropriate.     Data Reviewed: I have personally reviewed following labs and imaging studies  CBC:  Recent Labs Lab 11/03/16 1145 11/04/16 0358  WBC 8.9 6.8  NEUTROABS 6.6  --   HGB 14.5 14.2  HCT 41.6 43.1  MCV 101.2* 103.9*  PLT 169 154   Basic Metabolic Panel:  Recent Labs Lab 11/03/16 1145 11/04/16  4782 11/05/16 1014  NA 132* 136  --   K 3.5 3.1* 3.2*  CL 95* 102  --   CO2 25 24  --   GLUCOSE 129* 92  --   BUN <5* <5*  --   CREATININE 0.60 0.66  --   CALCIUM 9.4 9.3  --   MG  --   --  1.7   GFR: Estimated Creatinine Clearance: 87.9 mL/min (by C-G formula based on SCr of 0.66 mg/dL). Liver Function Tests:  Recent Labs Lab 11/04/16 0358  AST 37  ALT 15  ALKPHOS 86  BILITOT 1.0  PROT 6.3*  ALBUMIN 3.8   No results for input(s): LIPASE, AMYLASE in the last 168 hours. No results for input(s): AMMONIA in the last 168 hours. Coagulation Profile: No results for input(s): INR, PROTIME in the last 168 hours. Cardiac Enzymes:  Recent Labs Lab 11/03/16 1548 11/03/16 2112 11/04/16 0358  TROPONINI <0.03 <0.03 <0.03   BNP (last 3 results) No  results for input(s): PROBNP in the last 8760 hours. HbA1C:  Recent Labs  11/03/16 1145  HGBA1C 5.1   CBG:  Recent Labs Lab 11/03/16 1836 11/04/16 0821 11/04/16 1255 11/04/16 1642 11/05/16 0750  GLUCAP 122* 90 108* 162* 90   Lipid Profile: No results for input(s): CHOL, HDL, LDLCALC, TRIG, CHOLHDL, LDLDIRECT in the last 72 hours. Thyroid Function Tests: No results for input(s): TSH, T4TOTAL, FREET4, T3FREE, THYROIDAB in the last 72 hours. Anemia Panel: No results for input(s): VITAMINB12, FOLATE, FERRITIN, TIBC, IRON, RETICCTPCT in the last 72 hours. Sepsis Labs: No results for input(s): PROCALCITON, LATICACIDVEN in the last 168 hours.  No results found for this or any previous visit (from the past 240 hour(s)).       Radiology Studies: Mr Laqueta Jean NF Contrast  Result Date: 11/04/2016 CLINICAL DATA:  Initial evaluation for new onset seizure. EXAM: MRI HEAD WITHOUT AND WITH CONTRAST TECHNIQUE: Multiplanar, multiecho pulse sequences of the brain and surrounding structures were obtained without and with intravenous contrast. CONTRAST:  <See Chart> MULTIHANCE GADOBENATE DIMEGLUMINE 529 MG/ML IV SOLN COMPARISON:  Comparison made with previous CT from 11/04/2014 as well as previous MRI from 08/12/2011. FINDINGS: Brain: Generalized age-related cerebral atrophy. Patchy and confluent T2/FLAIR hyperintensity within the periventricular deep white matter both cerebral hemispheres, nonspecific, but suspected to be related to mild chronic small vessel ischemic changes. Findings have mildly progressed relative to 2013. No abnormal foci of restricted diffusion to suggest acute or subacute ischemia. Gray-white matter differentiation maintained. No encephalomalacia to suggest chronic infarction. No foci of susceptibility artifact to suggest acute or chronic intracranial hemorrhage. No mass lesion, midline shift or mass effect. Ventricles normal in size without evidence for hydrocephalus. No  extra-axial fluid collection. Major dural sinuses are patent. Thin section imaging through the temporal lobes demonstrates no intrinsic temporal lobe abnormality. Pituitary gland suprasellar region within normal limits. Midline structures intact and normal. Vascular: Major intracranial vascular flow voids are maintained. Skull and upper cervical spine: Craniocervical junction normal. Upper cervical spine within normal limits. Bone marrow signal intensity normal. No scalp soft tissue abnormality. Sinuses/Orbits: Globes and orbital soft tissues within normal limits. Paranasal sinuses are clear. Trace opacity noted within the right mastoid air cells, of doubtful significance. Inner ear structures normal. Other: None. IMPRESSION: 1. No acute intracranial abnormality. 2. Mild cerebral white matter changes, nonspecific, but suspected to be related to chronic microvascular ischemic disease. Changes are mildly progressed relative to 2013. Electronically Signed   By: Janell Quiet.D.  On: 11/04/2016 19:42        Scheduled Meds: . aspirin EC  81 mg Oral Daily  . atorvastatin  10 mg Oral QPM  . citalopram  40 mg Oral QHS  . folic acid  1 mg Oral Daily  . heparin  5,000 Units Subcutaneous Q8H  . LORazepam  1 mg Intravenous Once  . magnesium oxide  400 mg Oral BID  . mouth rinse  15 mL Mouth Rinse BID  . multivitamin with minerals  1 tablet Oral Daily  . potassium chloride  40 mEq Oral BID  . thiamine  100 mg Oral Daily   Or  . thiamine  100 mg Intravenous Daily  . traZODone  50 mg Oral QHS   Continuous Infusions: . sodium chloride 100 mL/hr at 11/05/16 1156     LOS: 1 day    Time spent: 35 minutes.     Kathlen Mody, MD [facility] Pager [phone]  If 7PM-7AM, please contact night-coverage [email or web address] Password [facility] 11/05/2016, 2:34 PM

## 2016-11-05 NOTE — Progress Notes (Signed)
Pt's BP 175/90 manually. Pt takes losartan-hydrochlorothiazide 50-12.5 mg at home. MD Blake Divine made aware. Will continue to assess.

## 2016-11-06 ENCOUNTER — Encounter (HOSPITAL_COMMUNITY): Payer: Self-pay

## 2016-11-06 LAB — GLUCOSE, CAPILLARY: Glucose-Capillary: 110 mg/dL — ABNORMAL HIGH (ref 65–99)

## 2016-11-06 MED ORDER — MAGNESIUM OXIDE 400 (241.3 MG) MG PO TABS: 400.0000 mg | ORAL_TABLET | Freq: Two times a day (BID) | ORAL | 0 refills | Status: DC

## 2016-11-06 MED ORDER — FOLIC ACID 1 MG PO TABS: 1.0000 mg | ORAL_TABLET | Freq: Every day | ORAL | 0 refills | Status: DC

## 2016-11-06 MED ORDER — THIAMINE HCL 100 MG PO TABS: 100.0000 mg | ORAL_TABLET | Freq: Every day | ORAL | 0 refills | Status: DC

## 2016-11-06 MED ORDER — CLINDAMYCIN HCL 300 MG PO CAPS: 300.0000 mg | ORAL_CAPSULE | Freq: Three times a day (TID) | ORAL | 0 refills | Status: AC

## 2016-11-06 NOTE — Progress Notes (Signed)
Patient discharge teaching given, including activity, diet, follow-up appoints, and medications. Patient verbalized understanding of all discharge instructions. IV access was d/c'd. Vitals are stable. Skin is intact except as charted in most recent assessments. Pt to be escorted out by NT, to be driven home by family. 

## 2016-11-07 NOTE — Discharge Summary (Signed)
Physician Discharge Summary  Susan Todd:[id number] DOB: 10/20/1965 DOA: 11/03/2016  PCP: Georgann Housekeeper, MD  Admit date: 11/03/2016 Discharge date: 11/06/2016  Admitted From: Home.  Disposition: Home.   Recommendations for Outpatient Follow-up:  1. Follow up with PCP in 1-2 weeks 2. Please obtain BMP/CBC in one week Please follow up with psychiatry as recommended.   Discharge Condition:*stable.  CODE STATUS: full code.  Diet recommendation: Heart Healthy   Brief/Interim Summary: Susan Gayler Lutzis a 51 y.o.femalewith a Past Medical History of alcohol abuse, depression-- comes in for witnessed seizure, possibly from alcohol withdrawal and cocaine abuse.   Discharge Diagnoses:  Active Problems:   Depression   Anxiety   Seizures (HCC)   COPD (chronic obstructive pulmonary disease) (HCC)   Alcohol abuse   Cocaine use   Seizure (HCC)  Seizures:  Probably from alcohol withdrawal. On ativan taper. MRI brain ordered by neurology, its unremarkable.  EEG was normal.  Discussed the results with the patient.  Neurology signed off. Recommend outpatient follow up with services for substance abuse.    Depression:  Not suicidal. Appreciate psychiatry recommendations.    Copd: no wheezing heard. Stable.    Cocaine use:  Cessation counseled.  Psychiatry consulted for depression and substance abuse. Resources given.    Alcohol withdrawal:no tremors, and no signs of withdrawal.   Was On CIWA.Marland Kitchen She did not require ativan for the last 12 hours.  She will be discharged home to follow up with PCP in one week.    Lip bruise from biting during the seizure:  Sore on the lower lip.  Antibiotic course given.     Hypokalemia and hypomagnesemia replaced.     Discharge Instructions  Discharge Instructions    Diet - low sodium heart healthy    Complete by:  As directed    Discharge instructions    Complete by:  As directed    Please follow up with PCP in one week.     Allergies as of 11/06/2016      Reactions   Codeine Nausea And Vomiting   Latex Hives      Medication List    STOP taking these medications   cephALEXin 500 MG capsule Commonly known as:  KEFLEX   diclofenac 75 MG EC tablet Commonly known as:  VOLTAREN   methocarbamol 500 MG tablet Commonly known as:  ROBAXIN     TAKE these medications   acetaminophen 325 MG tablet Commonly known as:  TYLENOL Take 650 mg by mouth every 6 (six) hours as needed for mild pain, moderate pain or headache.   acyclovir 400 MG tablet Commonly known as:  ZOVIRAX Take 400 mg by mouth 2 (two) times daily as needed (for cold sores).   aspirin EC 81 MG tablet Take 81 mg by mouth daily.   atorvastatin 10 MG tablet Commonly known as:  LIPITOR Take 10 mg by mouth every evening.   citalopram 40 MG tablet Commonly known as:  CELEXA Take 40 mg by mouth at bedtime.   clindamycin 300 MG capsule Commonly known as:  CLEOCIN Take 1 capsule (300 mg total) by mouth 3 (three) times daily.   folic acid 1 MG tablet Commonly known as:  FOLVITE Take 1 tablet (1 mg total) by mouth daily.   LORazepam 1 MG tablet Commonly known as:  ATIVAN Take 1 mg by mouth every 8 (eight) hours as needed for anxiety.   losartan-hydrochlorothiazide 50-12.5 MG tablet Commonly known as:  HYZAAR Take 1 tablet  by mouth at bedtime.   magnesium oxide 400 (241.3 Mg) MG tablet Commonly known as:  MAG-OX Take 1 tablet (400 mg total) by mouth 2 (two) times daily.   thiamine 100 MG tablet Take 1 tablet (100 mg total) by mouth daily.   traZODone 50 MG tablet Commonly known as:  DESYREL Take 50 mg by mouth at bedtime.            Discharge Care Instructions        Start     Ordered   11/07/16 0000  folic acid (FOLVITE) 1 MG tablet  Daily     11/06/16 1058   11/07/16 0000  thiamine 100 MG tablet  Daily     11/06/16 1058   11/06/16 0000  magnesium oxide (MAG-OX) 400 (241.3 Mg) MG tablet  2 times daily     11/06/16  1058   11/06/16 0000  Diet - low sodium heart healthy     11/06/16 1058   11/06/16 0000  clindamycin (CLEOCIN) 300 MG capsule  3 times daily     11/06/16 1058   11/06/16 0000  Discharge instructions    Comments:  Please follow up with PCP in one week.   11/06/16 1058     Follow-up Information    Georgann Housekeeper, MD. Schedule an appointment as soon as possible for a visit in 1 week(s).   Specialty:  Internal Medicine Contact information: [address] [place] [address] [phone]          Allergies  Allergen Reactions  . Codeine Nausea And Vomiting  . Latex Hives    Consultations:  Neurology  Psychiatry.    Procedures/Studies: Ct Head Wo Contrast  Result Date: 11/03/2016 CLINICAL DATA:  New onset seizure. EXAM: CT HEAD WITHOUT CONTRAST TECHNIQUE: Contiguous axial images were obtained from the base of the skull through the vertex without intravenous contrast. COMPARISON:  MRI brain dated August 12, 2011. CT head dated November 11, 2005. To the FINDINGS: Brain: No evidence of acute infarction, hemorrhage, hydrocephalus, extra-axial collection or mass lesion/mass effect. Vascular: Atherosclerotic vascular calcification of the carotid siphons. No hyperdense vessel. Skull: Normal. Negative for fracture or focal lesion. Sinuses/Orbits: The bilateral paranasal sinuses and mastoid air cells are clear. The orbits are unremarkable. Other: None. IMPRESSION: Normal noncontrast head CT. Electronically Signed   By: Obie Dredge M.D.   On: 11/03/2016 12:56   Mr Susan Todd Contrast  Result Date: 11/04/2016 CLINICAL DATA:  Initial evaluation for new onset seizure. EXAM: MRI HEAD WITHOUT AND WITH CONTRAST TECHNIQUE: Multiplanar, multiecho pulse sequences of the brain and surrounding structures were obtained without and with intravenous contrast. CONTRAST:  <See Chart> MULTIHANCE GADOBENATE DIMEGLUMINE 529 MG/ML IV SOLN COMPARISON:  Comparison made with previous CT from  11/04/2014 as well as previous MRI from 08/12/2011. FINDINGS: Brain: Generalized age-related cerebral atrophy. Patchy and confluent T2/FLAIR hyperintensity within the periventricular deep white matter both cerebral hemispheres, nonspecific, but suspected to be related to mild chronic small vessel ischemic changes. Findings have mildly progressed relative to 2013. No abnormal foci of restricted diffusion to suggest acute or subacute ischemia. Gray-white matter differentiation maintained. No encephalomalacia to suggest chronic infarction. No foci of susceptibility artifact to suggest acute or chronic intracranial hemorrhage. No mass lesion, midline shift or mass effect. Ventricles normal in size without evidence for hydrocephalus. No extra-axial fluid collection. Major dural sinuses are patent. Thin section imaging through the temporal lobes demonstrates no intrinsic temporal lobe abnormality. Pituitary gland suprasellar  region within normal limits. Midline structures intact and normal. Vascular: Major intracranial vascular flow voids are maintained. Skull and upper cervical spine: Craniocervical junction normal. Upper cervical spine within normal limits. Bone marrow signal intensity normal. No scalp soft tissue abnormality. Sinuses/Orbits: Globes and orbital soft tissues within normal limits. Paranasal sinuses are clear. Trace opacity noted within the right mastoid air cells, of doubtful significance. Inner ear structures normal. Other: None. IMPRESSION: 1. No acute intracranial abnormality. 2. Mild cerebral white matter changes, nonspecific, but suspected to be related to chronic microvascular ischemic disease. Changes are mildly progressed relative to 2013. Electronically Signed   By: Rise Mu M.D.   On: 11/04/2016 19:42       Subjective:  No new complaints.  Discharge Exam: Vitals:   11/05/16 2233 11/06/16 0431  BP: (!) 169/97 (!) 158/87  Pulse: 64 62  Resp:  17  Temp: 98.8 F (37.1 C)  98.4 F (36.9 C)  SpO2: 100% 100%   Vitals:   11/05/16 2128 11/05/16 2233 11/06/16 0431 11/06/16 0628  BP: (!) 178/111 (!) 169/97 (!) 158/87   Pulse: 77 64 62   Resp:   17   Temp: 98.8 F (37.1 C) 98.8 F (37.1 C) 98.4 F (36.9 C)   TempSrc: Oral Oral Oral   SpO2: 99% 100% 100%   Weight:    84 kg (185 lb 1.6 oz)  Height:        General: Pt is alert, awake, not in acute distress Cardiovascular: RRR, S1/S2 +, no rubs, no gallops Respiratory: CTA bilaterally, no wheezing, no rhonchi Abdominal: Soft, NT, ND, bowel sounds + Extremities: no edema, no cyanosis    The results of significant diagnostics from this hospitalization (including imaging, microbiology, ancillary and laboratory) are listed below for reference.     Microbiology: No results found for this or any previous visit (from the past 240 hour(s)).   Labs: BNP (last 3 results) No results for input(s): BNP in the last 8760 hours. Basic Metabolic Panel:  Recent Labs Lab 11/03/16 1145 11/04/16 0358 11/05/16 1014  NA 132* 136  --   K 3.5 3.1* 3.2*  CL 95* 102  --   CO2 25 24  --   GLUCOSE 129* 92  --   BUN <5* <5*  --   CREATININE 0.60 0.66  --   CALCIUM 9.4 9.3  --   MG  --   --  1.7   Liver Function Tests:  Recent Labs Lab 11/04/16 0358  AST 37  ALT 15  ALKPHOS 86  BILITOT 1.0  PROT 6.3*  ALBUMIN 3.8   No results for input(s): LIPASE, AMYLASE in the last 168 hours. No results for input(s): AMMONIA in the last 168 hours. CBC:  Recent Labs Lab 11/03/16 1145 11/04/16 0358  WBC 8.9 6.8  NEUTROABS 6.6  --   HGB 14.5 14.2  HCT 41.6 43.1  MCV 101.2* 103.9*  PLT 169 154   Cardiac Enzymes:  Recent Labs Lab 11/03/16 1548 11/03/16 2112 11/04/16 0358  TROPONINI <0.03 <0.03 <0.03   BNP: Invalid input(s): POCBNP CBG:  Recent Labs Lab 11/04/16 1255 11/04/16 1642 11/05/16 0750 11/05/16 1719 11/06/16 0815  GLUCAP 108* 162* 90 120* 110*   D-Dimer No results for input(s): DDIMER  in the last 72 hours. Hgb A1c No results for input(s): HGBA1C in the last 72 hours. Lipid Profile No results for input(s): CHOL, HDL, LDLCALC, TRIG, CHOLHDL, LDLDIRECT in the last 72 hours. Thyroid function studies No results for  input(s): TSH, T4TOTAL, T3FREE, THYROIDAB in the last 72 hours.  Invalid input(s): FREET3 Anemia work up No results for input(s): VITAMINB12, FOLATE, FERRITIN, TIBC, IRON, RETICCTPCT in the last 72 hours. Urinalysis    Component Value Date/Time   COLORURINE YELLOW 11/03/2016 1147   APPEARANCEUR CLEAR 11/03/2016 1147   LABSPEC 1.005 11/03/2016 1147   PHURINE 5.0 11/03/2016 1147   GLUCOSEU NEGATIVE 11/03/2016 1147   HGBUR SMALL (A) 11/03/2016 1147   BILIRUBINUR NEGATIVE 11/03/2016 1147   KETONESUR NEGATIVE 11/03/2016 1147   PROTEINUR 30 (A) 11/03/2016 1147   UROBILINOGEN 0.2 08/23/2006 1144   NITRITE NEGATIVE 11/03/2016 1147   LEUKOCYTESUR NEGATIVE 11/03/2016 1147   Sepsis Labs Invalid input(s): PROCALCITONIN,  WBC,  LACTICIDVEN Microbiology No results found for this or any previous visit (from the past 240 hour(s)).   Time coordinating discharge: Over 30 minutes  SIGNED:   Kathlen Mody, MD  [facility] 11/07/2016, 7:45 AM Pager   If 7PM-7AM, please contact night-coverage [email or web address] Password [email or web address]

## 2018-08-10 ENCOUNTER — Emergency Department (HOSPITAL_COMMUNITY): Payer: BLUE CROSS/BLUE SHIELD

## 2018-08-10 ENCOUNTER — Other Ambulatory Visit: Payer: Self-pay

## 2018-08-10 ENCOUNTER — Encounter (HOSPITAL_COMMUNITY): Payer: Self-pay | Admitting: Emergency Medicine

## 2018-08-10 ENCOUNTER — Inpatient Hospital Stay (HOSPITAL_COMMUNITY)
Admission: EM | Admit: 2018-08-10 | Discharge: 2018-08-13 | DRG: 641 | Disposition: A | Discharge status: Home Health | Payer: BLUE CROSS/BLUE SHIELD | Attending: Internal Medicine | Admitting: Internal Medicine

## 2018-08-10 DIAGNOSIS — F329 Major depressive disorder, single episode, unspecified: Secondary | ICD-10-CM | POA: Diagnosis present

## 2018-08-10 DIAGNOSIS — E8729 Other acidosis: Secondary | ICD-10-CM

## 2018-08-10 DIAGNOSIS — F17210 Nicotine dependence, cigarettes, uncomplicated: Secondary | ICD-10-CM | POA: Diagnosis present

## 2018-08-10 DIAGNOSIS — E871 Hypo-osmolality and hyponatremia: Principal | ICD-10-CM

## 2018-08-10 DIAGNOSIS — I10 Essential (primary) hypertension: Secondary | ICD-10-CM | POA: Diagnosis present

## 2018-08-10 DIAGNOSIS — F419 Anxiety disorder, unspecified: Secondary | ICD-10-CM | POA: Diagnosis present

## 2018-08-10 DIAGNOSIS — M549 Dorsalgia, unspecified: Secondary | ICD-10-CM | POA: Diagnosis present

## 2018-08-10 DIAGNOSIS — S80212A Abrasion, left knee, initial encounter: Secondary | ICD-10-CM | POA: Diagnosis present

## 2018-08-10 DIAGNOSIS — E785 Hyperlipidemia, unspecified: Secondary | ICD-10-CM | POA: Diagnosis present

## 2018-08-10 DIAGNOSIS — S93402A Sprain of unspecified ligament of left ankle, initial encounter: Secondary | ICD-10-CM | POA: Diagnosis present

## 2018-08-10 DIAGNOSIS — R296 Repeated falls: Secondary | ICD-10-CM | POA: Diagnosis present

## 2018-08-10 DIAGNOSIS — T07XXXA Unspecified multiple injuries, initial encounter: Secondary | ICD-10-CM | POA: Diagnosis not present

## 2018-08-10 DIAGNOSIS — Y92009 Unspecified place in unspecified non-institutional (private) residence as the place of occurrence of the external cause: Secondary | ICD-10-CM | POA: Diagnosis not present

## 2018-08-10 DIAGNOSIS — Y9301 Activity, walking, marching and hiking: Secondary | ICD-10-CM | POA: Diagnosis present

## 2018-08-10 DIAGNOSIS — G8929 Other chronic pain: Secondary | ICD-10-CM | POA: Diagnosis present

## 2018-08-10 DIAGNOSIS — F1010 Alcohol abuse, uncomplicated: Secondary | ICD-10-CM | POA: Diagnosis present

## 2018-08-10 DIAGNOSIS — E669 Obesity, unspecified: Secondary | ICD-10-CM | POA: Diagnosis present

## 2018-08-10 DIAGNOSIS — Z8249 Family history of ischemic heart disease and other diseases of the circulatory system: Secondary | ICD-10-CM

## 2018-08-10 DIAGNOSIS — F10129 Alcohol abuse with intoxication, unspecified: Secondary | ICD-10-CM | POA: Diagnosis present

## 2018-08-10 DIAGNOSIS — E872 Acidosis: Secondary | ICD-10-CM | POA: Diagnosis present

## 2018-08-10 DIAGNOSIS — W19XXXA Unspecified fall, initial encounter: Secondary | ICD-10-CM | POA: Diagnosis not present

## 2018-08-10 DIAGNOSIS — Z6830 Body mass index (BMI) 30.0-30.9, adult: Secondary | ICD-10-CM

## 2018-08-10 DIAGNOSIS — E876 Hypokalemia: Secondary | ICD-10-CM | POA: Diagnosis present

## 2018-08-10 DIAGNOSIS — Z7982 Long term (current) use of aspirin: Secondary | ICD-10-CM

## 2018-08-10 DIAGNOSIS — Z1159 Encounter for screening for other viral diseases: Secondary | ICD-10-CM | POA: Diagnosis not present

## 2018-08-10 DIAGNOSIS — R262 Difficulty in walking, not elsewhere classified: Secondary | ICD-10-CM | POA: Diagnosis not present

## 2018-08-10 DIAGNOSIS — W010XXA Fall on same level from slipping, tripping and stumbling without subsequent striking against object, initial encounter: Secondary | ICD-10-CM | POA: Diagnosis present

## 2018-08-10 DIAGNOSIS — I480 Paroxysmal atrial fibrillation: Secondary | ICD-10-CM | POA: Diagnosis present

## 2018-08-10 DIAGNOSIS — M545 Low back pain: Secondary | ICD-10-CM | POA: Diagnosis not present

## 2018-08-10 DIAGNOSIS — Z79899 Other long term (current) drug therapy: Secondary | ICD-10-CM | POA: Diagnosis not present

## 2018-08-10 DIAGNOSIS — N179 Acute kidney failure, unspecified: Secondary | ICD-10-CM | POA: Diagnosis present

## 2018-08-10 DIAGNOSIS — J449 Chronic obstructive pulmonary disease, unspecified: Secondary | ICD-10-CM | POA: Diagnosis present

## 2018-08-10 DIAGNOSIS — S80211A Abrasion, right knee, initial encounter: Secondary | ICD-10-CM | POA: Diagnosis present

## 2018-08-10 DIAGNOSIS — F32A Depression, unspecified: Secondary | ICD-10-CM | POA: Diagnosis present

## 2018-08-10 LAB — CBC WITH DIFFERENTIAL/PLATELET
Abs Immature Granulocytes: 0.05 10*3/uL (ref 0.00–0.07)
Basophils Absolute: 0 10*3/uL (ref 0.0–0.1)
Basophils Relative: 1 %
Eosinophils Absolute: 0 10*3/uL (ref 0.0–0.5)
Eosinophils Relative: 0 %
HCT: 32 % — ABNORMAL LOW (ref 36.0–46.0)
Hemoglobin: 11.7 g/dL — ABNORMAL LOW (ref 12.0–15.0)
Immature Granulocytes: 1 %
Lymphocytes Relative: 21 %
Lymphs Abs: 1.6 10*3/uL (ref 0.7–4.0)
MCH: 35.1 pg — ABNORMAL HIGH (ref 26.0–34.0)
MCHC: 36.6 g/dL — ABNORMAL HIGH (ref 30.0–36.0)
MCV: 96.1 fL (ref 80.0–100.0)
Monocytes Absolute: 0.9 10*3/uL (ref 0.1–1.0)
Monocytes Relative: 12 %
Neutro Abs: 4.8 10*3/uL (ref 1.7–7.7)
Neutrophils Relative %: 65 %
Platelets: 240 10*3/uL (ref 150–400)
RBC: 3.33 MIL/uL — ABNORMAL LOW (ref 3.87–5.11)
RDW: 13.1 % (ref 11.5–15.5)
WBC: 7.3 10*3/uL (ref 4.0–10.5)
nRBC: 0 % (ref 0.0–0.2)

## 2018-08-10 LAB — COMPREHENSIVE METABOLIC PANEL
ALT: 35 U/L (ref 0–44)
AST: 77 U/L — ABNORMAL HIGH (ref 15–41)
Albumin: 3.9 g/dL (ref 3.5–5.0)
Alkaline Phosphatase: 104 U/L (ref 38–126)
Anion gap: 17 — ABNORMAL HIGH (ref 5–15)
BUN: 6 mg/dL (ref 6–20)
CO2: 23 mmol/L (ref 22–32)
Calcium: 8.7 mg/dL — ABNORMAL LOW (ref 8.9–10.3)
Chloride: 73 mmol/L — ABNORMAL LOW (ref 98–111)
Creatinine, Ser: 0.58 mg/dL (ref 0.44–1.00)
GFR calc Af Amer: >60 mL/min (ref >60)
GFR calc non Af Amer: >60 mL/min (ref >60)
Glucose, Bld: 89 mg/dL (ref 70–99)
Potassium: 3.9 mmol/L (ref 3.5–5.1)
Sodium: 113 mmol/L — CL (ref 135–145)
Total Bilirubin: 0.8 mg/dL (ref 0.3–1.2)
Total Protein: 6.8 g/dL (ref 6.5–8.1)

## 2018-08-10 LAB — URINALYSIS, ROUTINE W REFLEX MICROSCOPIC
Bilirubin Urine: NEGATIVE
Glucose, UA: NEGATIVE mg/dL
Ketones, ur: 5 mg/dL — AB
Leukocytes,Ua: NEGATIVE
Nitrite: NEGATIVE
Protein, ur: NEGATIVE mg/dL
Specific Gravity, Urine: 1.004 — ABNORMAL LOW (ref 1.005–1.030)
pH: 5 (ref 5.0–8.0)

## 2018-08-10 LAB — SARS CORONAVIRUS 2 BY RT PCR (HOSPITAL ORDER, PERFORMED IN ~~LOC~~ HOSPITAL LAB): SARS Coronavirus 2: NEGATIVE

## 2018-08-10 LAB — NA AND K (SODIUM & POTASSIUM), RAND UR
Potassium Urine: 15 mmol/L
Sodium, Ur: 16 mmol/L

## 2018-08-10 LAB — RAPID URINE DRUG SCREEN, HOSP PERFORMED
Amphetamines: NOT DETECTED
Barbiturates: NOT DETECTED
Benzodiazepines: NOT DETECTED
Cocaine: NOT DETECTED
Opiates: NOT DETECTED
Tetrahydrocannabinol: NOT DETECTED

## 2018-08-10 LAB — OSMOLALITY, URINE: Osmolality, Ur: 173 mOsm/kg — ABNORMAL LOW (ref 300–900)

## 2018-08-10 MED ORDER — LORAZEPAM 1 MG PO TABS
1.0000 mg | ORAL_TABLET | Freq: Four times a day (QID) | ORAL | Status: DC | PRN
  Administered 2018-08-11 – 2018-08-13 (×3): 1 mg via ORAL
  Filled 2018-08-10 (×2): qty 1

## 2018-08-10 MED ORDER — LORAZEPAM 1 MG PO TABS
0.0000 mg | ORAL_TABLET | Freq: Two times a day (BID) | ORAL | Status: DC
  Administered 2018-08-13: 11:00:00 2 mg via ORAL
  Filled 2018-08-10: qty 2

## 2018-08-10 MED ORDER — ONDANSETRON HCL 4 MG PO TABS: 4.0000 mg | ORAL_TABLET | Freq: Four times a day (QID) | ORAL | Status: DC | PRN

## 2018-08-10 MED ORDER — LORAZEPAM 2 MG/ML IJ SOLN: 1.0000 mg | Freq: Four times a day (QID) | INTRAMUSCULAR | Status: DC | PRN

## 2018-08-10 MED ORDER — ASPIRIN EC 81 MG PO TBEC
81.0000 mg | DELAYED_RELEASE_TABLET | Freq: Every day | ORAL | Status: DC
  Administered 2018-08-11 – 2018-08-13 (×3): 81 mg via ORAL
  Filled 2018-08-10 (×3): qty 1

## 2018-08-10 MED ORDER — FOLIC ACID 1 MG PO TABS
1.0000 mg | ORAL_TABLET | Freq: Every day | ORAL | Status: DC
  Administered 2018-08-11 – 2018-08-13 (×3): 1 mg via ORAL
  Filled 2018-08-10 (×3): qty 1

## 2018-08-10 MED ORDER — TRAZODONE HCL 50 MG PO TABS
150.0000 mg | ORAL_TABLET | Freq: Every day | ORAL | Status: DC
  Administered 2018-08-11 – 2018-08-12 (×3): 150 mg via ORAL
  Filled 2018-08-10 (×3): qty 3

## 2018-08-10 MED ORDER — ADULT MULTIVITAMIN W/MINERALS CH
1.0000 | ORAL_TABLET | Freq: Every day | ORAL | Status: DC
  Administered 2018-08-11 – 2018-08-13 (×3): 1 via ORAL
  Filled 2018-08-10 (×3): qty 1

## 2018-08-10 MED ORDER — BISACODYL 5 MG PO TBEC: 5.0000 mg | DELAYED_RELEASE_TABLET | Freq: Every day | ORAL | Status: DC | PRN

## 2018-08-10 MED ORDER — LORAZEPAM 1 MG PO TABS
0.0000 mg | ORAL_TABLET | Freq: Four times a day (QID) | ORAL | Status: AC
  Administered 2018-08-12 (×3): 1 mg via ORAL
  Filled 2018-08-10 (×4): qty 1

## 2018-08-10 MED ORDER — VITAMIN B-1 100 MG PO TABS
100.0000 mg | ORAL_TABLET | Freq: Every day | ORAL | Status: DC
  Administered 2018-08-11 – 2018-08-13 (×3): 100 mg via ORAL
  Filled 2018-08-10 (×3): qty 1

## 2018-08-10 MED ORDER — CITALOPRAM HYDROBROMIDE 20 MG PO TABS
40.0000 mg | ORAL_TABLET | Freq: Every day | ORAL | Status: DC
  Filled 2018-08-10: qty 2

## 2018-08-10 MED ORDER — ACETAMINOPHEN 325 MG PO TABS
650.0000 mg | ORAL_TABLET | Freq: Four times a day (QID) | ORAL | Status: DC | PRN
  Administered 2018-08-11 – 2018-08-13 (×7): 650 mg via ORAL
  Filled 2018-08-10 (×7): qty 2

## 2018-08-10 MED ORDER — SENNOSIDES-DOCUSATE SODIUM 8.6-50 MG PO TABS: 1.0000 | ORAL_TABLET | Freq: Every evening | ORAL | Status: DC | PRN

## 2018-08-10 MED ORDER — LIP MEDEX EX OINT
TOPICAL_OINTMENT | CUTANEOUS | Status: DC | PRN
  Filled 2018-08-10 (×2): qty 7

## 2018-08-10 MED ORDER — ONDANSETRON HCL 4 MG/2ML IJ SOLN: 4.0000 mg | Freq: Four times a day (QID) | INTRAMUSCULAR | Status: DC | PRN

## 2018-08-10 MED ORDER — MAGNESIUM OXIDE 400 (241.3 MG) MG PO TABS
400.0000 mg | ORAL_TABLET | Freq: Two times a day (BID) | ORAL | Status: DC
  Administered 2018-08-11 – 2018-08-13 (×6): 400 mg via ORAL
  Filled 2018-08-10 (×6): qty 1

## 2018-08-10 MED ORDER — MAGNESIUM CITRATE PO SOLN: 1.0000 | Freq: Once | ORAL | Status: DC | PRN

## 2018-08-10 MED ORDER — ACETAMINOPHEN 650 MG RE SUPP: 650.0000 mg | Freq: Four times a day (QID) | RECTAL | Status: DC | PRN

## 2018-08-10 MED ORDER — ATORVASTATIN CALCIUM 10 MG PO TABS
10.0000 mg | ORAL_TABLET | Freq: Every evening | ORAL | Status: DC
  Administered 2018-08-11 – 2018-08-12 (×2): 10 mg via ORAL
  Filled 2018-08-10 (×2): qty 1

## 2018-08-10 NOTE — H&P (Addendum)
History and Physical   [facility] [place] [facility] Admission History and Physical [employer] Jerone Cudmore, D.O.    Patient Name: Susan Todd MR#: [id number] Date of Birth: 06/01/1965 Date of Admission: 08/10/2018  Referring MD/NP/PA: Dr. Dalene SeltzerSchlossman Primary Care Physician: Georgann HousekeeperHusain, Karrar, MD  Chief Complaint:  Chief Complaint  Patient presents with  . Fatigue  . Fall    HPI: Susan Todd is a 53 y.o. female with a known history of atrial fibrillation, anxiety/depression, COPD, HTN, seizures, EtOH and polysubstance abuse presents to the emergency department for evaluation of fall.  Patient was in a usual state of health until this afternoon when she fell after drinking alcohol, tripped forward landing on her knees.  She states that she falls multiple times per day usually because she is intoxicated.  She does drink to calm withdrawal symptoms but has not been through detox in the past.  She acknowledges that she needs to quit drinking but isn't sure if she wants to go to rehab. She complains of left ankle pain.  .    EMS/ED Course: Medical admission has been requested for further management of Euvolemia hyponatremia, EtOH intoxication.  Review of Systems:  CONSTITUTIONAL: No fever/chills, fatigue, weakness, weight gain/loss, headache. EYES: No blurry or double vision. ENT: No tinnitus, postnasal drip, redness or soreness of the oropharynx. RESPIRATORY: No cough, dyspnea, wheeze.  No hemoptysis.  CARDIOVASCULAR: No chest pain, palpitations, syncope, orthopnea. No lower extremity edema.  GASTROINTESTINAL: No nausea, vomiting, abdominal pain, diarrhea, constipation.  No hematemesis, melena or hematochezia. GENITOURINARY: No dysuria, frequency, hematuria. ENDOCRINE: No polyuria or nocturia. No heat or cold intolerance. HEMATOLOGY: Positive bruising. No anemia, bruising. INTEGUMENTARY: No rashes, ulcers, lesions. MUSCULOSKELETAL: Positive left ankle pain.  No arthritis,  gout. NEUROLOGIC: No numbness, tingling, ataxia, seizure-type activity, weakness. PSYCHIATRIC: No anxiety, depression, insomnia.   Past Medical History:  Diagnosis Date  . Anxiety   . Arthritis   . Atrial fibrillation (HCC)    "pt. has no knowledge of this" as of 03-07-14  . Carotid artery occlusion   . COPD (chronic obstructive pulmonary disease) (HCC)    Emphysema  . Depression   . Hypertension     Past Surgical History:  Procedure Laterality Date  . BUNIONECTOMY     BOTH FEET  . CYST EXCISION PERINEAL     8'03- Dr. Elana AlmMcPhail     reports that she has been smoking cigarettes. She has a 30.00 pack-year smoking history. She has never used smokeless tobacco. She reports current alcohol use. She reports that she does not use drugs.  REPORTS DRINKING 4 BEERS DAILY.  Allergies  Allergen Reactions  . Codeine Nausea And Vomiting  . Latex Hives    Family History  Problem Relation Age of Onset  . Heart disease Mother        NOT  before age 55  . Hyperlipidemia Mother   . Dementia Mother   . Hypertension Mother   . Cancer Father        MELANOMA  . Hyperlipidemia Father   . Hypertension Father   . Cancer Brother        MELANOMA  . Hyperlipidemia Brother   . Hypertension Brother   . Heart disease Brother        Arhymia  . Heart disease Maternal Aunt     Prior to Admission medications   Medication Sig Start Date End Date Taking? Authorizing Provider  acetaminophen (TYLENOL) 325 MG tablet Take 650 mg by mouth every 6 (  six) hours as needed for mild pain, moderate pain or headache.     [provider]  acyclovir (ZOVIRAX) 400 MG tablet Take 400 mg by mouth 2 (two) times daily as needed (for cold sores).     [provider]  aspirin EC 81 MG tablet Take 81 mg by mouth daily.    [provider]  atorvastatin (LIPITOR) 10 MG tablet Take 10 mg by mouth every evening.     [provider]  citalopram (CELEXA) 40 MG tablet Take 40 mg by mouth at  bedtime.    [provider]  folic acid (FOLVITE) 1 MG tablet Take 1 tablet (1 mg total) by mouth daily. 11/07/16   Hosie Poisson, MD  LORazepam (ATIVAN) 1 MG tablet Take 1 mg by mouth every 8 (eight) hours as needed for anxiety.     [provider]  losartan-hydrochlorothiazide (HYZAAR) 50-12.5 MG tablet Take 1 tablet by mouth at bedtime.    [provider]  magnesium oxide (MAG-OX) 400 (241.3 Mg) MG tablet Take 1 tablet (400 mg total) by mouth 2 (two) times daily. 11/06/16   Hosie Poisson, MD  thiamine 100 MG tablet Take 1 tablet (100 mg total) by mouth daily. 11/07/16   Hosie Poisson, MD  traZODone (DESYREL) 50 MG tablet Take 50 mg by mouth at bedtime.    [provider]    Physical Exam: Vitals:   08/10/18 1731 08/10/18 1802 08/10/18 2027 08/10/18 2030  BP: 131/87  (!) 148/91   Pulse: 99  88   Resp: 15  17   Temp: 98.7 F (37.1 C)     TempSrc: Oral     SpO2: 95% 94% 95%   Weight:    95.3 kg  Height:    5\' 10"  (1.778 m)    GENERAL: 53 y.o.-year-old female patient, lying in the bed in no acute distress.  HEENT: Head atraumatic, normocephalic. Pupils equal. Mucus membranes dry. NECK: Supple. No JVD. CHEST: Normal breath sounds bilaterally. No wheezing, rales, rhonchi or crackles. CARDIOVASCULAR: S1, S2 normal. No murmurs, rubs, or gallops. Cap refill <2 seconds. Pulses intact distally.  ABDOMEN: Soft, nondistended, nontender. No rebound, guarding, rigidity. Normoactive bowel sounds present in all four quadrants.  EXTREMITIES: Left ankle mildly swollen, tender to palpation. No pedal edema, cyanosis, or clubbing. No calf tenderness or Homan's sign.  NEUROLOGIC: The patient is alert and oriented x 3. Cranial nerves II through XII are grossly intact with no focal sensorimotor deficit. PSYCHIATRIC:  Normal affect, mood, thought content. SKIN: Multiple areas of bruising in various stages of healing most significant in right lower abdomen and groin.   Multiple skin wounds, abrasions.     Labs on Admission:  CBC: Recent Labs  Lab 08/10/18 1845  WBC 7.3  NEUTROABS 4.8  HGB 11.7*  HCT 32.0*  MCV 96.1  PLT 177   Basic Metabolic Panel: Recent Labs  Lab 08/10/18 1845  NA 113*  K 3.9  CL 73*  CO2 23  GLUCOSE 89  BUN 6  CREATININE 0.58  CALCIUM 8.7*   GFR: Estimated Creatinine Clearance: 99.3 mL/min (by C-G formula based on SCr of 0.58 mg/dL). Liver Function Tests: Recent Labs  Lab 08/10/18 1845  AST 77*  ALT 35  ALKPHOS 104  BILITOT 0.8  PROT 6.8  ALBUMIN 3.9   No results for input(s): LIPASE, AMYLASE in the last 168 hours. No results for input(s): AMMONIA in the last 168 hours. Coagulation Profile: No results for input(s): INR, PROTIME  in the last 168 hours. Cardiac Enzymes: No results for input(s): CKTOTAL, CKMB, CKMBINDEX, TROPONINI in the last 168 hours. BNP (last 3 results) No results for input(s): PROBNP in the last 8760 hours. HbA1C: No results for input(s): HGBA1C in the last 72 hours. CBG: No results for input(s): GLUCAP in the last 168 hours. Lipid Profile: No results for input(s): CHOL, HDL, LDLCALC, TRIG, CHOLHDL, LDLDIRECT in the last 72 hours. Thyroid Function Tests: No results for input(s): TSH, T4TOTAL, FREET4, T3FREE, THYROIDAB in the last 72 hours. Anemia Panel: No results for input(s): VITAMINB12, FOLATE, FERRITIN, TIBC, IRON, RETICCTPCT in the last 72 hours. Urine analysis:    Component Value Date/Time   COLORURINE STRAW (A) 08/10/2018 2027   APPEARANCEUR CLEAR 08/10/2018 2027   LABSPEC 1.004 (L) 08/10/2018 2027   PHURINE 5.0 08/10/2018 2027   GLUCOSEU NEGATIVE 08/10/2018 2027   HGBUR SMALL (A) 08/10/2018 2027   BILIRUBINUR NEGATIVE 08/10/2018 2027   KETONESUR 5 (A) 08/10/2018 2027   PROTEINUR NEGATIVE 08/10/2018 2027   UROBILINOGEN 0.2 08/23/2006 1144   NITRITE NEGATIVE 08/10/2018 2027   LEUKOCYTESUR NEGATIVE 08/10/2018 2027   Sepsis  Labs: @LABRCNTIP (procalcitonin:4,lacticidven:4) )No results found for this or any previous visit (from the past 240 hour(s)).   Radiological Exams on Admission: Dg Ankle Complete Left  Result Date: 08/10/2018 CLINICAL DATA:  Recurrent falls over the past week. Left ankle pain. Initial encounter. EXAM: LEFT ANKLE COMPLETE - 3+ VIEW COMPARISON:  Plain films left ankle 04/29/2009. FINDINGS: Soft tissues about the ankle appear swollen. No underlying bony or joint abnormality is seen. IMPRESSION: Soft tissue swelling without underlying bony or joint abnormality. Electronically Signed   By: Drusilla Kannerhomas  Dalessio M.D.   On: 08/10/2018 19:40   Ct Head Wo Contrast  Result Date: 08/10/2018 CLINICAL DATA:  Generalized weakness. History of multiple falls last week. EXAM: CT HEAD WITHOUT CONTRAST TECHNIQUE: Contiguous axial images were obtained from the base of the skull through the vertex without intravenous contrast. COMPARISON:  Head CT 11/03/2016 FINDINGS: Brain: Stable mild age advanced cerebral atrophy, ventriculomegaly and periventricular white matter disease. No acute intracranial findings such as hemispheric infarction or intracranial hemorrhage. No mass lesions. No extra-axial fluid collections are identified. The brainstem and cerebellum are grossly normal. Vascular: Scattered vascular calcifications. No definite aneurysm or hyperdense vessels. Skull: No skull fracture or bone lesions. Sinuses/Orbits: The paranasal sinuses and mastoid air cells are clear. The globes are intact. Other: No scalp lesions or hematoma. IMPRESSION: No acute intracranial findings or mass lesions. Electronically Signed   By: Rudie MeyerP.  Gallerani M.D.   On: 08/10/2018 20:26   Dg Foot Complete Left  Result Date: 08/10/2018 CLINICAL DATA:  Left foot pain. History of several falls over the past week. Initial encounter. EXAM: LEFT FOOT - COMPLETE 3+ VIEW COMPARISON:  Plain films left great toe 04/29/2009. FINDINGS: No acute bony or joint  abnormality is identified. The patient is status post hallux valgus resection. Soft tissues of the foot appear mildly swollen. IMPRESSION: Mild soft tissue swelling without underlying acute bony or joint abnormality. Electronically Signed   By: Drusilla Kannerhomas  Dalessio M.D.   On: 08/10/2018 19:41    EKG: Normal sinus rhythm at 93 bpm with normal axis, PVCs and nonspecific ST-T wave changes.   Assessment/Plan  This is a 53 y.o. female with a history of atrial fibrillation, anxiety/depression, COPD, HTN, seizures, EtOH and polysubstance abuse now being admitted with:  #. Euvolemic hyponatremia - Admit inpatient tele - Fluid restrict - Check mag, phos - Hold Hyzaar  #.  Left ankle sprain - Symptomatic relief  #. History of EtOH / polysubstance abuse - Check EtOH level and UDS - Standing Ativan oral and PRN CIWA - Banana bag - Social work consult - Seizure, fall and aspiration precautions - Check INR  #. History of afib - Monitor on tele  #. History of HLD - Continue Lipitor  #. History of HTN - Hold Hyzaar for now  #. History of anxiety/depression - Continue Pristiq, Neurontin, gabapentin, trazodone   Admission status: Inpatient, tele IV Fluids: HL Diet/Nutrition: Heart healthy Consults called: None  DVT Px: Lovenox, SCDs and early ambulation. Code Status: Full Code  Disposition Plan: To home in 3-4 days  All the records are reviewed and case discussed with ED provider. Management plans discussed with the patient and/or family who express understanding and agree with plan of care.  Donnald Tabar D.O. on 08/10/2018 at 9:15 PM CC: Primary care physician; Georgann HousekeeperHusain, Karrar, MD   08/10/2018, 9:15 PM

## 2018-08-10 NOTE — Progress Notes (Signed)
ED TO INPATIENT HANDOFF REPORT  Name/Age/Gender Susan Todd T Grether 53 y.o. female  Code Status Code Status History    Date Active Date Inactive Code Status Order ID Comments User Context   11/03/2016 1534 11/06/2016 1609 Full Code [id number]  Elwyn ReachWertman, Sara E, PA-C ED   Advance Care Planning Activity      Home/SNF/Other Home  Chief Complaint Weakness  Level of Care/Admitting Diagnosis ED Disposition    ED Disposition Condition Comment   Admit  [facility] [[id number]]  Level of Care: Telemetry [5]  Admit to tele based on following criteria: Other see comments  Comments: Severe hyponatremia  Covid Evaluation: Screening Protocol (No Symptoms)  Diagnosis: Hyponatremia with normal extracellular fluid volume [[id number]][[id number]]  Admitting Physician: Tonye RoyaltyHUGELMEYER, ALEXIS [[id number]]  Attending Physician: Janann ColonelHUGELMEYER, ALEXIS [[id number]]  Estimated length of stay: 3 - 4 days  Certification:: I certify this patient will need inpatient services for at least 2 midnights  PT Class (Do Not Modify): Inpatient [101]  PT Acc Code (Do Not Modify): Private [1]       Medical History Past Medical History:  Diagnosis Date  . Anxiety   . Arthritis   . Atrial fibrillation (HCC)    "pt. has no knowledge of this" as of 03-07-14  . Carotid artery occlusion   . COPD (chronic obstructive pulmonary disease) (HCC)    Emphysema  . Depression   . Hypertension     Allergies Allergies  Allergen Reactions  . Codeine Nausea And Vomiting  . Latex Hives    IV Location/Drains/Wounds Patient Lines/Drains/Airways Status   Active Line/Drains/Airways    Name:   Placement date:   Placement time:   Site:   Days:   Peripheral IV 08/10/18 Left Antecubital   08/10/18    1727    Antecubital   less than 1   External Urinary Catheter   08/10/18    1858    -   less than 1          Labs/Imaging Results for orders placed or performed during the hospital encounter of 08/10/18 (from the past 48  hour(s))  CBC with Differential     Status: Abnormal   Collection Time: 08/10/18  6:45 PM  Result Value Ref Range   WBC 7.3 4.0 - 10.5 K/uL   RBC 3.33 (L) 3.87 - 5.11 MIL/uL   Hemoglobin 11.7 (L) 12.0 - 15.0 g/dL   HCT 56.232.0 (L) 13.036.0 - 86.546.0 %   MCV 96.1 80.0 - 100.0 fL   MCH 35.1 (H) 26.0 - 34.0 pg   MCHC 36.6 (H) 30.0 - 36.0 g/dL   RDW 78.413.1 69.611.5 - 29.515.5 %   Platelets 240 150 - 400 K/uL   nRBC 0.0 0.0 - 0.2 %   Neutrophils Relative % 65 %   Neutro Abs 4.8 1.7 - 7.7 K/uL   Lymphocytes Relative 21 %   Lymphs Abs 1.6 0.7 - 4.0 K/uL   Monocytes Relative 12 %   Monocytes Absolute 0.9 0.1 - 1.0 K/uL   Eosinophils Relative 0 %   Eosinophils Absolute 0.0 0.0 - 0.5 K/uL   Basophils Relative 1 %   Basophils Absolute 0.0 0.0 - 0.1 K/uL   Immature Granulocytes 1 %   Abs Immature Granulocytes 0.05 0.00 - 0.07 K/uL    Comment: Performed at [facility], [address] [place], [place] [address]  Comprehensive metabolic panel     Status: Abnormal   Collection Time: 08/10/18  6:45 PM  Result Value Ref Range   Sodium 113 (LL) 135 - 145 mmol/L    Comment: CRITICAL RESULT CALLED TO, READ BACK BY AND VERIFIED WITH: BROOKS,B RN @1929  ON 08/10/2018 JACKSON,K    Potassium 3.9 3.5 - 5.1 mmol/L   Chloride 73 (L) 98 - 111 mmol/L   CO2 23 22 - 32 mmol/L   Glucose, Bld 89 70 - 99 mg/dL   BUN 6 6 - 20 mg/dL   Creatinine, Ser 0.58 0.44 - 1.00 mg/dL   Calcium 8.7 (L) 8.9 - 10.3 mg/dL   Total Protein 6.8 6.5 - 8.1 g/dL   Albumin 3.9 3.5 - 5.0 g/dL   AST 77 (H) 15 - 41 U/L   ALT 35 0 - 44 U/L   Alkaline Phosphatase 104 38 - 126 U/L   Total Bilirubin 0.8 0.3 - 1.2 mg/dL   GFR calc non Af Amer >60 >60 mL/min   GFR calc Af Amer >60 >60 mL/min   Anion gap 17 (H) 5 - 15    Comment: Performed at [facility], [place] [address] [place], [place] [address]  Urinalysis, Routine w reflex microscopic     Status: Abnormal   Collection Time: 08/10/18  8:27 PM  Result Value Ref  Range   Color, Urine STRAW (A) YELLOW   APPearance CLEAR CLEAR   Specific Gravity, Urine 1.004 (L) 1.005 - 1.030   pH 5.0 5.0 - 8.0   Glucose, UA NEGATIVE NEGATIVE mg/dL   Hgb urine dipstick SMALL (A) NEGATIVE   Bilirubin Urine NEGATIVE NEGATIVE   Ketones, ur 5 (A) NEGATIVE mg/dL   Protein, ur NEGATIVE NEGATIVE mg/dL   Nitrite NEGATIVE NEGATIVE   Leukocytes,Ua NEGATIVE NEGATIVE   Bacteria, UA RARE (A) NONE SEEN   Squamous Epithelial / LPF 0-5 0 - 5    Comment: Performed at [facility], [place] [address], [place], [place] [address]  Na and K (sodium & potassium), rand urine     Status: None   Collection Time: 08/10/18  8:27 PM  Result Value Ref Range   Sodium, Ur 16 mmol/L   Potassium Urine 15 mmol/L    Comment: Performed at [facility], [place] [address] [place], [place] [address]   Dg Chest 2 View  Result Date: 08/10/2018 CLINICAL DATA:  Generalized weakness and multiple falls over the past week. EXAM: CHEST - 2 VIEW COMPARISON:  None. FINDINGS: Lungs clear. The chest is hyperexpanded with attenuation of the pulmonary vasculature. Heart size normal. No pneumothorax or pleural fluid. No bony abnormality. IMPRESSION: No acute disease. COPD. Electronically Signed   By: Inge Rise M.D.   On: 08/10/2018 21:52   Dg Ankle Complete Left  Result Date: 08/10/2018 CLINICAL DATA:  Recurrent falls over the past week. Left ankle pain. Initial encounter. EXAM: LEFT ANKLE COMPLETE - 3+ VIEW COMPARISON:  Plain films left ankle 04/29/2009. FINDINGS: Soft tissues about the ankle appear swollen. No underlying bony or joint abnormality is seen. IMPRESSION: Soft tissue swelling without underlying bony or joint abnormality. Electronically Signed   By: Inge Rise M.D.   On: 08/10/2018 19:40   Ct Head Wo Contrast  Result Date: 08/10/2018 CLINICAL DATA:  Generalized weakness. History of multiple falls last week. EXAM: CT HEAD WITHOUT CONTRAST TECHNIQUE:  Contiguous axial images were obtained from the base of the skull through the vertex without intravenous contrast. COMPARISON:  Head CT 11/03/2016 FINDINGS: Brain: Stable mild age advanced cerebral atrophy, ventriculomegaly and periventricular white matter disease. No  acute intracranial findings such as hemispheric infarction or intracranial hemorrhage. No mass lesions. No extra-axial fluid collections are identified. The brainstem and cerebellum are grossly normal. Vascular: Scattered vascular calcifications. No definite aneurysm or hyperdense vessels. Skull: No skull fracture or bone lesions. Sinuses/Orbits: The paranasal sinuses and mastoid air cells are clear. The globes are intact. Other: No scalp lesions or hematoma. IMPRESSION: No acute intracranial findings or mass lesions. Electronically Signed   By: Rudie MeyerP.  Gallerani M.D.   On: 08/10/2018 20:26   Dg Foot Complete Left  Result Date: 08/10/2018 CLINICAL DATA:  Left foot pain. History of several falls over the past week. Initial encounter. EXAM: LEFT FOOT - COMPLETE 3+ VIEW COMPARISON:  Plain films left great toe 04/29/2009. FINDINGS: No acute bony or joint abnormality is identified. The patient is status post hallux valgus resection. Soft tissues of the foot appear mildly swollen. IMPRESSION: Mild soft tissue swelling without underlying acute bony or joint abnormality. Electronically Signed   By: Drusilla Kannerhomas  Dalessio M.D.   On: 08/10/2018 19:41    Pending Labs Unresulted Labs (From admission, onward)    Start     Ordered   08/10/18 1937  SARS Coronavirus 2 (CEPHEID - Performed in [facility] hospital lab), Hosp Order  (Asymptomatic Patients Labs)  Once,   STAT    Question:  Rule Out  Answer:  Yes   08/10/18 1936   08/10/18 1934  Osmolality, urine  Once,   STAT     08/10/18 1934   08/10/18 1807  Urine culture  ONCE - STAT,   STAT     08/10/18 1806   Signed and Held  Magnesium  Once,   R     Signed and Held   Signed and Held  HIV antibody (Routine  Testing)  Once,   R     Signed and Held   Signed and Held  Magnesium  Add-on,   R     Signed and Held   Signed and Held  Phosphorus  Add-on,   R     Signed and Held   Signed and Interior and spatial designerHeld  Basic metabolic panel  Now then every 4 hours,   R     Signed and Held   Medical illustratorigned and Held  CBC  Tomorrow morning,   R     Signed and Held   Signed and Held  Ethanol  Once,   R     Signed and Held   Signed and Held  Urine rapid drug screen (hosp performed)  ONCE - STAT,   R     Signed and Held   Signed and Held  TSH  Once,   R     Signed and Held          Vitals/Pain Today's Vitals   08/10/18 2027 08/10/18 2029 08/10/18 2030 08/10/18 2137  BP: (!) 148/91     Pulse: 88   96  Resp: 17   14  Temp:      TempSrc:      SpO2: 95%   96%  Weight:   95.3 kg   Height:   5\' 10"  (1.778 m)   PainSc:  0-No pain      Isolation Precautions No active isolations  Medications Medications - No data to display  Mobility walks

## 2018-08-10 NOTE — ED Provider Notes (Signed)
[facility]-EMERGENCY DEPT Provider Note   CSN: [id number] Arrival date & time: 08/10/18  1715    History   Chief Complaint Chief Complaint  Patient presents with  . Fatigue  . Fall    HPI Susan Todd is a 53 y.o. female.     HPI   Hx of ETOH abuse Had a few shots and fell around 2PM and could not get up.  Called EMS to help her get up off the floor, and EMS recommended she come to the emergency department for evaluation of her left ankle.  Patient denies having significant pain.  She denies hitting her head, no headache no neck pain.  No loss of consciousness.  Reports she also fell last week when she tripped over her cat.  Reports she does drink too much.  Denies any other symptoms other than feeling very fatigued recently. Believes she was on the floor for 30 min prior to EMS arrival.   Past Medical History:  Diagnosis Date  . Anxiety   . Arthritis   . Atrial fibrillation (HCC)    "pt. has no knowledge of this" as of 03-07-14  . Carotid artery occlusion   . COPD (chronic obstructive pulmonary disease) (HCC)    Emphysema  . Depression   . Hypertension     Patient Active Problem List   Diagnosis Date Noted  . Hyponatremia with normal extracellular fluid volume 08/10/2018  . Seizure (HCC) 11/04/2016  . Seizures (HCC) 11/03/2016  . COPD (chronic obstructive pulmonary disease) (HCC) 11/03/2016  . Alcohol abuse 11/03/2016  . Cocaine use 11/03/2016  . Shortness of breath-with exertion 09/23/2013  . Occlusion and stenosis of carotid artery without mention of cerebral infarction 08/22/2011  . Depression   . Anxiety   . Arthritis     Past Surgical History:  Procedure Laterality Date  . BUNIONECTOMY     BOTH FEET  . CYST EXCISION PERINEAL     8'03- Dr. Elana AlmMcPhail     OB History    Gravida  3   Para  2   Term      Preterm      AB  1   Living  2     SAB      TAB      Ectopic      Multiple      Live Births                Home Medications    Prior to Admission medications   Medication Sig Start Date End Date Taking? Authorizing Provider  acetaminophen (TYLENOL) 500 MG tablet Take 1,500 mg by mouth every 6 (six) hours as needed for mild pain or headache.   Yes [provider]  aspirin EC 81 MG tablet Take 81 mg by mouth daily.   Yes [provider]  atorvastatin (LIPITOR) 10 MG tablet Take 10 mg by mouth every evening.    Yes [provider]  cetirizine (ZYRTEC) 5 MG tablet Take 5 mg by mouth at bedtime.   Yes [provider]  desvenlafaxine (PRISTIQ) 50 MG 24 hr tablet Take 50 mg by mouth at bedtime. 08/02/18  Yes [provider]  gabapentin (NEURONTIN) 100 MG capsule Take 100 mg by mouth at bedtime. 06/06/18  Yes [provider]  LORazepam (ATIVAN) 1 MG tablet Take 1 mg by mouth every 8 (eight) hours as needed for anxiety.    Yes [provider]  losartan-hydrochlorothiazide (HYZAAR) 50-12.5 MG tablet  Take 1 tablet by mouth daily.    Yes [provider]  magnesium oxide (MAG-OX) 400 (241.3 Mg) MG tablet Take 1 tablet (400 mg total) by mouth 2 (two) times daily. Patient taking differently: Take 400 mg by mouth daily.  11/06/16  Yes Kathlen ModyAkula, Vijaya, MD  traZODone (DESYREL) 150 MG tablet Take 150 mg by mouth at bedtime. 06/03/18  Yes [provider]  folic acid (FOLVITE) 1 MG tablet Take 1 tablet (1 mg total) by mouth daily. Patient not taking: Reported on 08/10/2018 11/07/16   Kathlen ModyAkula, Vijaya, MD  thiamine 100 MG tablet Take 1 tablet (100 mg total) by mouth daily. Patient not taking: Reported on 08/10/2018 11/07/16   Kathlen ModyAkula, Vijaya, MD    Family History Family History  Problem Relation Age of Onset  . Heart disease Mother        NOT  before age 55  . Hyperlipidemia Mother   . Dementia Mother   . Hypertension Mother   . Cancer Father        MELANOMA  . Hyperlipidemia Father   . Hypertension Father   . Cancer Brother        MELANOMA   . Hyperlipidemia Brother   . Hypertension Brother   . Heart disease Brother        Arhymia  . Heart disease Maternal Aunt     Social History Social History   Tobacco Use  . Smoking status: Current Every Day Smoker    Packs/day: 1.00    Years: 30.00    Pack years: 30.00    Types: Cigarettes  . Smokeless tobacco: Never Used  . Tobacco comment: pt states that she knows she needs to quit but has been stressed and uses cigs as a resort  Substance Use Topics  . Alcohol use: Yes    Comment: rare-social  . Drug use: No    Comment: abused drugs in the past 1989     Allergies   Codeine and Latex   Review of Systems Review of Systems  Constitutional: Positive for fatigue. Negative for fever.  HENT: Negative for congestion.   Respiratory: Negative for cough and shortness of breath.   Cardiovascular: Negative for chest pain.  Gastrointestinal: Negative for nausea and vomiting.  Genitourinary: Negative for dysuria.  Skin: Positive for color change and wound.  Neurological: Negative for headaches.     Physical Exam Updated Vital Signs BP 132/85 (BP Location: Right Arm)   Pulse 96   Temp 98.8 F (37.1 C) (Oral)   Resp 20   Ht 5\' 10"  (1.778 m)   Wt 95.3 kg   SpO2 97%   BMI 30.13 kg/m   Physical Exam Vitals signs and nursing note reviewed.  Constitutional:      General: She is not in acute distress.    Appearance: She is well-developed. She is not diaphoretic.  HENT:     Head: Normocephalic and atraumatic.  Eyes:     Conjunctiva/sclera: Conjunctivae normal.  Neck:     Musculoskeletal: Normal range of motion.  Cardiovascular:     Rate and Rhythm: Normal rate and regular rhythm.     Heart sounds: Normal heart sounds. No murmur. No friction rub. No gallop.   Pulmonary:     Effort: Pulmonary effort is normal. No respiratory distress.     Breath sounds: Normal breath sounds. No wheezing or rales.  Abdominal:     General: There is no distension.     Palpations:  Abdomen is soft.  Tenderness: There is no abdominal tenderness. There is no guarding.     Comments: Contusions across pelvis/bilateral groin, left lower back  Musculoskeletal:        General: No tenderness.  Skin:    General: Skin is warm and dry.     Findings: Bruising (multiple bruises across abdomen, lower left back, left lower extremity, bilateral knee abrasions and contusoins) and erythema (multiple abrasions to knee, healing abrasion with mild surrounding erythema to LLE, no sign of fluctuance) present. No rash.  Neurological:     Mental Status: She is alert and oriented to person, place, and time.      ED Treatments / Results  Labs (all labs ordered are listed, but only abnormal results are displayed) Labs Reviewed  CBC WITH DIFFERENTIAL/PLATELET - Abnormal; Notable for the following components:      Result Value   RBC 3.33 (*)    Hemoglobin 11.7 (*)    HCT 32.0 (*)    MCH 35.1 (*)    MCHC 36.6 (*)    All other components within normal limits  COMPREHENSIVE METABOLIC PANEL - Abnormal; Notable for the following components:   Sodium 113 (*)    Chloride 73 (*)    Calcium 8.7 (*)    AST 77 (*)    Anion gap 17 (*)    All other components within normal limits  URINALYSIS, ROUTINE W REFLEX MICROSCOPIC - Abnormal; Notable for the following components:   Color, Urine STRAW (*)    Specific Gravity, Urine 1.004 (*)    Hgb urine dipstick SMALL (*)    Ketones, ur 5 (*)    Bacteria, UA RARE (*)    All other components within normal limits  OSMOLALITY, URINE - Abnormal; Notable for the following components:   Osmolality, Ur 173 (*)    All other components within normal limits  SARS CORONAVIRUS 2 (HOSPITAL ORDER, PERFORMED IN [facility])  URINE CULTURE  NA AND K (SODIUM & POTASSIUM), RAND UR  HIV ANTIBODY (ROUTINE TESTING W REFLEX)  MAGNESIUM  PHOSPHORUS  BASIC METABOLIC PANEL  BASIC METABOLIC PANEL  BASIC METABOLIC PANEL  BASIC METABOLIC PANEL  CBC   ETHANOL  RAPID URINE DRUG SCREEN, HOSP PERFORMED  TSH    EKG EKG Interpretation  Date/Time:  Friday August 10 2018 18:07:48 EDT Ventricular Rate:  93 PR Interval:    QRS Duration: 90 QT Interval:  363 QTC Calculation: 449 R Axis:   51 Text Interpretation:  Sinus rhythm Ventricular premature complex Nonspecific T abnormalities, anterior leads No significant change since last tracing Confirmed by Alvira Monday ([id number]) on 08/10/2018 11:36:47 PM   Radiology Dg Chest 2 View  Result Date: 08/10/2018 CLINICAL DATA:  Generalized weakness and multiple falls over the past week. EXAM: CHEST - 2 VIEW COMPARISON:  None. FINDINGS: Lungs clear. The chest is hyperexpanded with attenuation of the pulmonary vasculature. Heart size normal. No pneumothorax or pleural fluid. No bony abnormality. IMPRESSION: No acute disease. COPD. Electronically Signed   By: Drusilla Kanner M.D.   On: 08/10/2018 21:52   Dg Ankle Complete Left  Result Date: 08/10/2018 CLINICAL DATA:  Recurrent falls over the past week. Left ankle pain. Initial encounter. EXAM: LEFT ANKLE COMPLETE - 3+ VIEW COMPARISON:  Plain films left ankle 04/29/2009. FINDINGS: Soft tissues about the ankle appear swollen. No underlying bony or joint abnormality is seen. IMPRESSION: Soft tissue swelling without underlying bony or joint abnormality. Electronically Signed   By: Drusilla Kanner M.D.   On:  08/10/2018 19:40   Ct Head Wo Contrast  Result Date: 08/10/2018 CLINICAL DATA:  Generalized weakness. History of multiple falls last week. EXAM: CT HEAD WITHOUT CONTRAST TECHNIQUE: Contiguous axial images were obtained from the base of the skull through the vertex without intravenous contrast. COMPARISON:  Head CT 11/03/2016 FINDINGS: Brain: Stable mild age advanced cerebral atrophy, ventriculomegaly and periventricular white matter disease. No acute intracranial findings such as hemispheric infarction or intracranial hemorrhage. No mass lesions. No  extra-axial fluid collections are identified. The brainstem and cerebellum are grossly normal. Vascular: Scattered vascular calcifications. No definite aneurysm or hyperdense vessels. Skull: No skull fracture or bone lesions. Sinuses/Orbits: The paranasal sinuses and mastoid air cells are clear. The globes are intact. Other: No scalp lesions or hematoma. IMPRESSION: No acute intracranial findings or mass lesions. Electronically Signed   By: Rudie MeyerP.  Gallerani M.D.   On: 08/10/2018 20:26   Dg Foot Complete Left  Result Date: 08/10/2018 CLINICAL DATA:  Left foot pain. History of several falls over the past week. Initial encounter. EXAM: LEFT FOOT - COMPLETE 3+ VIEW COMPARISON:  Plain films left great toe 04/29/2009. FINDINGS: No acute bony or joint abnormality is identified. The patient is status post hallux valgus resection. Soft tissues of the foot appear mildly swollen. IMPRESSION: Mild soft tissue swelling without underlying acute bony or joint abnormality. Electronically Signed   By: Drusilla Kannerhomas  Dalessio M.D.   On: 08/10/2018 19:41    Procedures .Critical Care Performed by: Alvira MondaySchlossman, Denisa Enterline, MD Authorized by: Alvira MondaySchlossman, Kaena Santori, MD   Critical care provider statement:    Critical care time (minutes):  30   Critical care was time spent personally by me on the following activities:  Examination of patient, ordering and performing treatments and interventions, ordering and review of laboratory studies, ordering and review of radiographic studies, pulse oximetry, obtaining history from patient or surrogate and review of old charts   (including critical care time)  Medications Ordered in ED Medications  aspirin EC tablet 81 mg (has no administration in time range)  atorvastatin (LIPITOR) tablet 10 mg (has no administration in time range)  citalopram (CELEXA) tablet 40 mg (has no administration in time range)  traZODone (DESYREL) tablet 150 mg (has no administration in time range)  folic acid (FOLVITE)  tablet 1 mg (has no administration in time range)  thiamine (VITAMIN B-1) tablet 100 mg (has no administration in time range)  magnesium oxide (MAG-OX) tablet 400 mg (has no administration in time range)  acetaminophen (TYLENOL) tablet 650 mg (has no administration in time range)    Or  acetaminophen (TYLENOL) suppository 650 mg (has no administration in time range)  senna-docusate (Senokot-S) tablet 1 tablet (has no administration in time range)  bisacodyl (DULCOLAX) EC tablet 5 mg (has no administration in time range)  magnesium citrate solution 1 Bottle (has no administration in time range)  ondansetron (ZOFRAN) tablet 4 mg (has no administration in time range)    Or  ondansetron (ZOFRAN) injection 4 mg (has no administration in time range)  LORazepam (ATIVAN) tablet 1 mg (has no administration in time range)    Or  LORazepam (ATIVAN) injection 1 mg (has no administration in time range)  multivitamin with minerals tablet 1 tablet (has no administration in time range)  LORazepam (ATIVAN) tablet 0-4 mg (has no administration in time range)    Followed by  LORazepam (ATIVAN) tablet 0-4 mg (has no administration in time range)  lip balm (CARMEX) ointment (has no administration in time range)  Initial Impression / Assessment and Plan / ED Course  I have reviewed the triage vital signs and the nursing notes.  Pertinent labs & imaging results that were available during my care of the patient were reviewed by me and considered in my medical decision making (see chart for details).        53 year old female with a history of atrial fibrillation, carotid artery occlusion, COPD, depression, hypertension, seizures, and alcohol abuse, presents with concern for fatigue and falls.  Regarding falls, CT head shows no acute findings, x-ray of the left lower extremity shows no sign of fracture.  She has multiple contusions, however given her hemodynamic stability, lack of abdominal pain or new  back pain I have a low suspicion this time for intra-abdominal injuries or retroperitoneal bleed.  Suspect likely left ankle sprain, for which patient can weight-bear as tolerated, may be placed in an Aircast.  Given patient's fatigue, labs were obtained which were significant for a sodium of 113.  She appears euvolemic on exam, and suspect given her history of significant alcohol use this represents beer drinkers potomania, however evaluation for etiology will continue as inpatient.  Ordered additional labs.  She has not had seizure and has normal mentation.  Patient admitted for continued care of hyponatremia with a sodium of 113.    Final Clinical Impressions(s) / ED Diagnoses   Final diagnoses:  Hyponatremia  Fall, initial encounter  Multiple contusions  Sprain of left ankle, unspecified ligament, initial encounter    ED Discharge Orders    None       Gareth Morgan, MD 08/10/18 2345

## 2018-08-10 NOTE — ED Notes (Signed)
Bed: WA01 Expected date:  Expected time:  Means of arrival:  Comments: EMS-weak 

## 2018-08-10 NOTE — ED Notes (Signed)
Pt urinated in the bed so we changed the linen on the bed and applied a purewick

## 2018-08-10 NOTE — ED Triage Notes (Signed)
Arrived by EMS from home with c/o generalized weakness and multiple falls in the last week. Denies any head injury. Multiple bruises and injury to left leg an left ankle. CBG; 109 NAD

## 2018-08-11 DIAGNOSIS — I480 Paroxysmal atrial fibrillation: Secondary | ICD-10-CM

## 2018-08-11 DIAGNOSIS — F10129 Alcohol abuse with intoxication, unspecified: Secondary | ICD-10-CM

## 2018-08-11 DIAGNOSIS — E871 Hypo-osmolality and hyponatremia: Principal | ICD-10-CM

## 2018-08-11 DIAGNOSIS — N179 Acute kidney failure, unspecified: Secondary | ICD-10-CM

## 2018-08-11 DIAGNOSIS — E876 Hypokalemia: Secondary | ICD-10-CM

## 2018-08-11 LAB — MAGNESIUM: Magnesium: 1.8 mg/dL (ref 1.7–2.4)

## 2018-08-11 LAB — PROTIME-INR
INR: 0.9 (ref 0.8–1.2)
Prothrombin Time: 11.6 seconds (ref 11.4–15.2)

## 2018-08-11 LAB — MRSA PCR SCREENING: MRSA by PCR: NEGATIVE

## 2018-08-11 LAB — BASIC METABOLIC PANEL
Anion gap: 14 (ref 5–15)
Anion gap: 15 (ref 5–15)
Anion gap: 16 — ABNORMAL HIGH (ref 5–15)
BUN: 5 mg/dL — ABNORMAL LOW (ref 6–20)
BUN: 5 mg/dL — ABNORMAL LOW (ref 6–20)
BUN: 5 mg/dL — ABNORMAL LOW (ref 6–20)
CO2: 24 mmol/L (ref 22–32)
CO2: 27 mmol/L (ref 22–32)
CO2: 28 mmol/L (ref 22–32)
Calcium: 8.6 mg/dL — ABNORMAL LOW (ref 8.9–10.3)
Calcium: 8.9 mg/dL (ref 8.9–10.3)
Calcium: 8.9 mg/dL (ref 8.9–10.3)
Chloride: 78 mmol/L — ABNORMAL LOW (ref 98–111)
Chloride: 80 mmol/L — ABNORMAL LOW (ref 98–111)
Chloride: 81 mmol/L — ABNORMAL LOW (ref 98–111)
Creatinine, Ser: 0.52 mg/dL (ref 0.44–1.00)
Creatinine, Ser: 0.54 mg/dL (ref 0.44–1.00)
Creatinine, Ser: 0.58 mg/dL (ref 0.44–1.00)
GFR calc Af Amer: >60 mL/min (ref >60)
GFR calc Af Amer: >60 mL/min (ref >60)
GFR calc Af Amer: >60 mL/min (ref >60)
GFR calc non Af Amer: >60 mL/min (ref >60)
GFR calc non Af Amer: >60 mL/min (ref >60)
GFR calc non Af Amer: >60 mL/min (ref >60)
Glucose, Bld: 105 mg/dL — ABNORMAL HIGH (ref 70–99)
Glucose, Bld: 76 mg/dL (ref 70–99)
Glucose, Bld: 81 mg/dL (ref 70–99)
Potassium: 3.3 mmol/L — ABNORMAL LOW (ref 3.5–5.1)
Potassium: 3.3 mmol/L — ABNORMAL LOW (ref 3.5–5.1)
Potassium: 3.5 mmol/L (ref 3.5–5.1)
Sodium: 117 mmol/L — CL (ref 135–145)
Sodium: 123 mmol/L — ABNORMAL LOW (ref 135–145)
Sodium: 123 mmol/L — ABNORMAL LOW (ref 135–145)

## 2018-08-11 LAB — ETHANOL: Alcohol, Ethyl (B): 64 mg/dL — ABNORMAL HIGH (ref <10)

## 2018-08-11 LAB — CBC
HCT: 32.8 % — ABNORMAL LOW (ref 36.0–46.0)
Hemoglobin: 11.6 g/dL — ABNORMAL LOW (ref 12.0–15.0)
MCH: 34.8 pg — ABNORMAL HIGH (ref 26.0–34.0)
MCHC: 35.4 g/dL (ref 30.0–36.0)
MCV: 98.5 fL (ref 80.0–100.0)
Platelets: 190 10*3/uL (ref 150–400)
RBC: 3.33 MIL/uL — ABNORMAL LOW (ref 3.87–5.11)
RDW: 13.3 % (ref 11.5–15.5)
WBC: 3.5 10*3/uL — ABNORMAL LOW (ref 4.0–10.5)
nRBC: 0 % (ref 0.0–0.2)

## 2018-08-11 LAB — TSH: TSH: 0.676 u[IU]/mL (ref 0.350–4.500)

## 2018-08-11 LAB — PHOSPHORUS: Phosphorus: 2.8 mg/dL (ref 2.5–4.6)

## 2018-08-11 LAB — HIV ANTIBODY (ROUTINE TESTING W REFLEX): HIV Screen 4th Generation wRfx: NONREACTIVE

## 2018-08-11 MED ORDER — SODIUM CHLORIDE 0.9 % IV SOLN
INTRAVENOUS | Status: DC
  Administered 2018-08-11 – 2018-08-13 (×3): via INTRAVENOUS

## 2018-08-11 MED ORDER — IBUPROFEN 200 MG PO TABS
400.0000 mg | ORAL_TABLET | Freq: Four times a day (QID) | ORAL | Status: DC | PRN
  Administered 2018-08-11 – 2018-08-12 (×3): 400 mg via ORAL
  Filled 2018-08-11 (×3): qty 2

## 2018-08-11 MED ORDER — VENLAFAXINE HCL ER 75 MG PO CP24
75.0000 mg | ORAL_CAPSULE | Freq: Every day | ORAL | Status: DC
  Administered 2018-08-11 – 2018-08-12 (×3): 75 mg via ORAL
  Filled 2018-08-11 (×3): qty 1

## 2018-08-11 MED ORDER — LORATADINE 10 MG PO TABS
10.0000 mg | ORAL_TABLET | Freq: Every day | ORAL | Status: DC
  Administered 2018-08-11 – 2018-08-12 (×3): 10 mg via ORAL
  Filled 2018-08-11 (×3): qty 1

## 2018-08-11 MED ORDER — GABAPENTIN 100 MG PO CAPS
100.0000 mg | ORAL_CAPSULE | Freq: Every day | ORAL | Status: DC
  Administered 2018-08-11 – 2018-08-12 (×3): 100 mg via ORAL
  Filled 2018-08-11 (×3): qty 1

## 2018-08-11 NOTE — Evaluation (Addendum)
Physical Therapy Evaluation Patient Details Name: Susan Todd MRN: [id number] DOB: 12/16/1965 Today's Date: 08/11/2018   History of Present Illness  53 yo female admitted with hyponatremia, fall, L ankle sprain. Hx of ETOH abuse, polysubstance abuse, Afib, COPD, Sz  Clinical Impression  On eval, pt required Min assist for mobility. She walked ~115 feet while holding on to the IV pole for support. She is unsteady and remains at risk for falls. Will continue to follow and progress as able. Discussed d/c plan-pt plans to return home where she lives with a roommate. She stated her roommate can assist as needed. Will recommend HHPT f/u for general strength and balance training.     Follow Up Recommendations Home health PT;Supervision for mobility/OOB    Equipment Recommendations  Rolling Walker    Recommendations for Other Services       Precautions / Restrictions Precautions Precautions: Fall Restrictions Weight Bearing Restrictions: No      Mobility  Bed Mobility Overal bed mobility: Needs Assistance Bed Mobility: Supine to Sit     Supine to sit: Supervision     General bed mobility comments: for safety, lines  Transfers Overall transfer level: Needs assistance Equipment used: None Transfers: Sit to/from Stand Sit to Stand: Min guard         General transfer comment: close guard for safety.  Ambulation/Gait Ambulation/Gait assistance: Min assist Gait Distance (Feet): 115 Feet Assistive device: IV Pole Gait Pattern/deviations: Step-through pattern;Decreased stride length;Staggering left;Staggering right;Drifts right/left     General Gait Details: Assist to steady throughout distance. Cues for safety. Pt tolerated activity well.  Stairs            Wheelchair Mobility    Modified Rankin (Stroke Patients Only)       Balance Overall balance assessment: Needs assistance           Standing balance-Leahy Scale: Fair                                Pertinent Vitals/Pain Pain Assessment: No/denies pain    Home Living Family/patient expects to be discharged to:: Private residence Living Arrangements: Non-relatives/Friends             Home Equipment: None      Prior Function Level of Independence: Independent               Hand Dominance        Extremity/Trunk Assessment   Upper Extremity Assessment Upper Extremity Assessment: Overall WFL for tasks assessed    Lower Extremity Assessment Lower Extremity Assessment: Generalized weakness    Cervical / Trunk Assessment Cervical / Trunk Assessment: Normal  Communication   Communication: No difficulties  Cognition Arousal/Alertness: Awake/alert Behavior During Therapy: WFL for tasks assessed/performed Overall Cognitive Status: Within Functional Limits for tasks assessed                                        General Comments      Exercises     Assessment/Plan    PT Assessment Patient needs continued PT services  PT Problem List Decreased mobility;Decreased activity tolerance;Decreased balance;Decreased knowledge of use of DME       PT Treatment Interventions DME instruction;Gait training;Therapeutic activities;Therapeutic exercise;Patient/family education;Functional mobility training    PT Goals (Current goals can be found in the Care Plan section)  Acute  Rehab PT Goals Patient Stated Goal: home PT Goal Formulation: With patient Time For Goal Achievement: 08/24/18 Potential to Achieve Goals: Good    Frequency Min 3X/week   Barriers to discharge        Co-evaluation               AM-PAC PT "6 Clicks" Mobility  Outcome Measure Help needed turning from your back to your side while in a flat bed without using bedrails?: A Little Help needed moving from lying on your back to sitting on the side of a flat bed without using bedrails?: A Little Help needed moving to and from a bed to a chair (including a  wheelchair)?: A Little Help needed standing up from a chair using your arms (e.g., wheelchair or bedside chair)?: A Little Help needed to walk in hospital room?: A Little Help needed climbing 3-5 steps with a railing? : A Little 6 Click Score: 18    End of Session Equipment Utilized During Treatment: Gait belt Activity Tolerance: Patient tolerated treatment well Patient left: in bed;with call bell/phone within reach;with bed alarm set   PT Visit Diagnosis: Unsteadiness on feet (R26.81);Difficulty in walking, not elsewhere classified (R26.2);History of falling (Z91.81)    Time: 1610-9604 PT Time Calculation (min) (ACUTE ONLY): 16 min   Charges:   PT Evaluation $PT Eval Moderate Complexity: [place], PT Acute Rehabilitation Services Pager: [phone] Office: [phone]

## 2018-08-11 NOTE — Progress Notes (Addendum)
PROGRESS NOTE    Susan Todd  ZOX:[id number] DOB: 01/15/1966 DOA: 08/10/2018 PCP: Georgann HousekeeperHusain, Karrar, MD    Brief Narrative:  53 year old female who presented with fatigue and a mechanical fall.  She does have significant past medical history for atrial fibrillation, anxiety/depression, COPD, hypertension, seizures, alcohol and polysubstance abuse.  Patient sustained a mechanical fall while walking, landing on her knees, no head trauma loss of consciousness.  Patient was actively drinking alcohol.  On her initial physical examination blood pressure 131/87, pulse rate 99, respirate 15, temperature 98.7, oxygen saturation 94%.  She had dry mucous membranes, her lungs were clear to auscultation bilaterally, heart S1-S2 present and rhythmic, creatinine was soft nondistended, no lower extremity edema.  Neurologically patient was nonfocal.  Sodium 113, potassium 3.9, chloride 73, bicarb 23, glucose 89, BUN 6, creatinine 0.58, calcium 8.7, anion gap 17, white count 7.3, hemoglobin 11.7, hematocrit 32.0, platelets 240.  Urinalysis specific gravity 1.004, urine osmolality 173, urine potassium 15, urine sodium 16.  SARS COVID-19 negative.  CT head negative for acute changes.  Chest radiograph negative for infiltrates.  EKG 93 bpm, normal axis, normal intervals, sinus rhythm with normal conduction, no ST segment or T wave changes.  Patient was admitted to the hospital working diagnosis of symptomatic hyponatremia related with alcohol intoxication complicated with alcoholic ketoacidosis.   Assessment & Plan:   Active Problems:   Hyponatremia with normal extracellular fluid volume   1. Severe hyponatremia with hypokalemia and anion gap metabolic acidosis. Likely related to alcohol intoxication. Na has been slowly trending up with fluid restriction, up to 123 this am. Urine specific gravity and osmolality are appropriately low. Serum cr down to 0.58, K at 3,5 and serum bicarbonate at 28 with anion gap at 14.  Today  with signs of hypovolemia, will add isotonic saline at 50 ml per H and will follow on renal panel in am. Continue to encourage po intake. Physical therapy evaluation.  2. Alcohol abuse. Will continue neuro checks per unit protocol, as needed lorazepam per CIWA protocol, continue vitamins including thiamine.   3. Ambulatory dysfunction. Follow with physical therapy recommendations.   4, Paroxysmal atrial fibrillation. Continue telemetry monitoring, rate has been controlled, currently off anticoagulation due to risk of bleeding.   5, HTN. Continue to hold on hyzaar due to risk of hypotension.   6. Obesity. BMI is 30,1.   7. Chronic back pain. Resume as needed ibuprofen at a reduced dose of 400 mg.   DVT prophylaxis: enoxaparin   Code Status: full Family Communication: no family at the bedside  Disposition Plan/ discharge barriers: pending clinical improvement.  Body mass index is 30.13 kg/m. Malnutrition Type:      Malnutrition Characteristics:      Nutrition Interventions:     RN Pressure Injury Documentation:     Consultants:     Procedures:     Antimicrobials:       Subjective: Patient feeling better but not yet back to baseline, no nausea or vomiting, at home had diarrhea and drinks daily beer a home. Very weak and deconditioned.   Objective: Vitals:   08/10/18 2222 08/10/18 2223 08/10/18 2251 08/11/18 0615  BP: (!) 143/86  132/85 102/68  Pulse:  90 96 100  Resp:  18 20 19   Temp:   98.8 F (37.1 C)   TempSrc:   Oral   SpO2:  96% 97% 94%  Weight:      Height:        Intake/Output Summary (Last  24 hours) at 08/11/2018 1042 Last data filed at 08/11/2018 0654 Gross per 24 hour  Intake -  Output 2200 ml  Net -2200 ml   Filed Weights   08/10/18 2030  Weight: 95.3 kg    Examination:   General: deconditioned  Neurology: Awake and alert, non focal  E ENT: mild pallor, no icterus, oral mucosa dry Cardiovascular: No JVD. S1-S2 present,  rhythmic, no gallops, rubs, or murmurs. No lower extremity edema. Pulmonary: positive breath sounds bilaterally, adequate air movement, no wheezing, rhonchi or rales. Gastrointestinal. Abdomen with no organomegaly, non tender, no rebound or guarding Skin. Left distal ley local hematoma.  Musculoskeletal: no joint deformities     Data Reviewed: I have personally reviewed following labs and imaging studies  CBC: Recent Labs  Lab 08/10/18 1845 08/11/18 0453  WBC 7.3 3.5*  NEUTROABS 4.8  --   HGB 11.7* 11.6*  HCT 32.0* 32.8*  MCV 96.1 98.5  PLT 240 790   Basic Metabolic Panel: Recent Labs  Lab 08/10/18 1845 08/10/18 2341 08/11/18 0453  NA 113* 117* 123*  K 3.9 3.3* 3.3*  CL 73* 78* 80*  CO2 23 24 27   GLUCOSE 89 81 76  BUN 6 5* 5*  CREATININE 0.58 0.52 0.54  CALCIUM 8.7* 8.6* 8.9  MG  --  1.8  --   PHOS  --  2.8  --    GFR: Estimated Creatinine Clearance: 99.3 mL/min (by C-G formula based on SCr of 0.54 mg/dL). Liver Function Tests: Recent Labs  Lab 08/10/18 1845  AST 77*  ALT 35  ALKPHOS 104  BILITOT 0.8  PROT 6.8  ALBUMIN 3.9   No results for input(s): LIPASE, AMYLASE in the last 168 hours. No results for input(s): AMMONIA in the last 168 hours. Coagulation Profile: Recent Labs  Lab 08/11/18 0453  INR 0.9   Cardiac Enzymes: No results for input(s): CKTOTAL, CKMB, CKMBINDEX, TROPONINI in the last 168 hours. BNP (last 3 results) No results for input(s): PROBNP in the last 8760 hours. HbA1C: No results for input(s): HGBA1C in the last 72 hours. CBG: No results for input(s): GLUCAP in the last 168 hours. Lipid Profile: No results for input(s): CHOL, HDL, LDLCALC, TRIG, CHOLHDL, LDLDIRECT in the last 72 hours. Thyroid Function Tests: Recent Labs    08/10/18 2341  TSH 0.676   Anemia Panel: No results for input(s): VITAMINB12, FOLATE, FERRITIN, TIBC, IRON, RETICCTPCT in the last 72 hours.    Radiology Studies: I have reviewed all of the imaging  during this hospital visit personally     Scheduled Meds: . aspirin EC  81 mg Oral Daily  . atorvastatin  10 mg Oral QPM  . folic acid  1 mg Oral Daily  . gabapentin  100 mg Oral QHS  . loratadine  10 mg Oral QHS  . LORazepam  0-4 mg Oral Q6H   Followed by  . [START ON 08/13/2018] LORazepam  0-4 mg Oral Q12H  . magnesium oxide  400 mg Oral BID  . multivitamin with minerals  1 tablet Oral Daily  . thiamine  100 mg Oral Daily  . traZODone  150 mg Oral QHS  . venlafaxine XR  75 mg Oral QHS   Continuous Infusions:   LOS: 1 day        Ziv Welchel Gerome Apley, MD

## 2018-08-12 DIAGNOSIS — M545 Low back pain: Secondary | ICD-10-CM

## 2018-08-12 DIAGNOSIS — G8929 Other chronic pain: Secondary | ICD-10-CM

## 2018-08-12 LAB — BASIC METABOLIC PANEL
Anion gap: 13 (ref 5–15)
BUN: 7 mg/dL (ref 6–20)
CO2: 29 mmol/L (ref 22–32)
Calcium: 8.9 mg/dL (ref 8.9–10.3)
Chloride: 87 mmol/L — ABNORMAL LOW (ref 98–111)
Creatinine, Ser: 0.63 mg/dL (ref 0.44–1.00)
GFR calc Af Amer: >60 mL/min (ref >60)
GFR calc non Af Amer: >60 mL/min (ref >60)
Glucose, Bld: 94 mg/dL (ref 70–99)
Potassium: 2.9 mmol/L — ABNORMAL LOW (ref 3.5–5.1)
Sodium: 129 mmol/L — ABNORMAL LOW (ref 135–145)

## 2018-08-12 LAB — URINE CULTURE: Culture: NO GROWTH

## 2018-08-12 LAB — PROTIME-INR
INR: 0.9 (ref 0.8–1.2)
Prothrombin Time: 11.6 seconds (ref 11.4–15.2)

## 2018-08-12 MED ORDER — POTASSIUM CHLORIDE CRYS ER 20 MEQ PO TBCR
40.0000 meq | EXTENDED_RELEASE_TABLET | ORAL | Status: AC
  Administered 2018-08-12 (×2): 40 meq via ORAL
  Filled 2018-08-12 (×2): qty 2

## 2018-08-12 MED ORDER — PANTOPRAZOLE SODIUM 40 MG PO TBEC
40.0000 mg | DELAYED_RELEASE_TABLET | Freq: Every day | ORAL | Status: DC
  Administered 2018-08-12 – 2018-08-13 (×2): 40 mg via ORAL
  Filled 2018-08-12 (×2): qty 1

## 2018-08-12 MED ORDER — IBUPROFEN 800 MG PO TABS
800.0000 mg | ORAL_TABLET | Freq: Four times a day (QID) | ORAL | Status: DC | PRN
  Administered 2018-08-12: 16:00:00 800 mg via ORAL
  Filled 2018-08-12: qty 1

## 2018-08-12 NOTE — Progress Notes (Addendum)
PROGRESS NOTE    Susan Todd  LFY:[id number] DOB: 1965-12-17 DOA: 08/10/2018 PCP: Wenda Low, MD    Brief Narrative:  53 year old female who presented with fatigue and a mechanical fall.  She does have significant past medical history for atrial fibrillation, anxiety/depression, COPD, hypertension, seizures, alcohol and polysubstance abuse.  Patient sustained a mechanical fall while walking, landing on her knees, no head trauma loss of consciousness.  Patient was actively drinking alcohol.  On her initial physical examination blood pressure 131/87, pulse rate 99, respirate 15, temperature 98.7, oxygen saturation 94%.  She had dry mucous membranes, her lungs were clear to auscultation bilaterally, heart S1-S2 present and rhythmic, creatinine was soft nondistended, no lower extremity edema.  Neurologically patient was nonfocal.  Sodium 113, potassium 3.9, chloride 73, bicarb 23, glucose 89, BUN 6, creatinine 0.58, calcium 8.7, anion gap 17, white count 7.3, hemoglobin 11.7, hematocrit 32.0, platelets 240.  Urinalysis specific gravity 1.004, urine osmolality 173, urine potassium 15, urine sodium 16.  SARS COVID-19 negative.  CT head negative for acute changes.  Chest radiograph negative for infiltrates.  EKG 93 bpm, normal axis, normal intervals, sinus rhythm with normal conduction, no ST segment or T wave changes.  Patient was admitted to the hospital working diagnosis of symptomatic hyponatremia related with alcohol intoxication complicated with alcoholic ketoacidosis.   Assessment & Plan:   Active Problems:   Hyponatremia with normal extracellular fluid volume   1. Severe hyponatremia with hypokalemia and anion gap metabolic acidosis. Likely related to alcohol intoxication, beer potomania. Na continue to improve, now up to 129, and noted worsening hypokalemia. Patient tolerating po well, no fluid restrictions. Will continue saline at 50 ml per H and will correct K with 80 meq of Kcl in 2  divided doses. Follow on renal panel in am.    2. Alcohol abuse. On lorazepam per CIWA protocol, crequired 3 mg yesterday, will continue neuro checks per unit protocol. Patient drinks daily beer at home. Continue with vitamins and thiamine.   3. Ambulatory dysfunction. Patient will need home health services, rolling walker at discharge.   4, Paroxysmal atrial fibrillation. Continue rate controlled, not on anticoagulation due to alcohol abuse and fall risk. Continue with asa.   5, HTN. Blood pressure 113 mmHg, will continue to hold on antihypertensive medications for now.   6. Obesity. BMI is 30,1.   7. Chronic back pain. As needed ibuprofen, patient requesting 800 mg. Will have caution with renal function. Add prophylactic pantoprazole.   DVT prophylaxis: enoxaparin   Code Status: full Family Communication: no family at the bedside  Disposition Plan/ discharge barriers: pending clinical improvement.   Body mass index is 30.13 kg/m. Malnutrition Type:      Malnutrition Characteristics:      Nutrition Interventions:     RN Pressure Injury Documentation:     Consultants:     Procedures:     Antimicrobials:       Subjective: Patient feeling better, but not yet back to baseline, continue to have generalized weakness and decreased balance, no chest pain, no nausea or vomiting.   Objective: Vitals:   08/11/18 0615 08/11/18 1231 08/11/18 2217 08/12/18 0657  BP: 102/68 125/71 110/71 113/65  Pulse: 100 (!) 102 75 72  Resp: 19 16 16 16   Temp:  98.8 F (37.1 C) 98.4 F (36.9 C) 97.6 F (36.4 C)  TempSrc:  Oral Oral Oral  SpO2: 94% 95% 94% 96%  Weight:      Height:  Intake/Output Summary (Last 24 hours) at 08/12/2018 1110 Last data filed at 08/12/2018 0529 Gross per 24 hour  Intake 1119.37 ml  Output 800 ml  Net 319.37 ml   Filed Weights   08/10/18 2030  Weight: 95.3 kg    Examination:   General: deconditioned  Neurology: Awake and  alert, non focal  E ENT: mild pallor, no icterus, oral mucosa moist Cardiovascular: No JVD. S1-S2 present, rhythmic, no gallops, rubs, or murmurs. No lower extremity edema. Pulmonary: positive breath sounds bilaterally, adequate air movement, no wheezing, rhonchi or rales. Gastrointestinal. Abdomen with no organomegaly, non tender, no rebound or guarding Skin. Multiple skin ecchymosis upper and lower extremities. Distal left leg hematoma.  Musculoskeletal: no joint deformities     Data Reviewed: I have personally reviewed following labs and imaging studies  CBC: Recent Labs  Lab 08/10/18 1845 08/11/18 0453  WBC 7.3 3.5*  NEUTROABS 4.8  --   HGB 11.7* 11.6*  HCT 32.0* 32.8*  MCV 96.1 98.5  PLT 240 190   Basic Metabolic Panel: Recent Labs  Lab 08/10/18 1845 08/10/18 2341 08/11/18 0453 08/11/18 1114 08/12/18 0533  NA 113* 117* 123* 123* 129*  K 3.9 3.3* 3.3* 3.5 2.9*  CL 73* 78* 80* 81* 87*  CO2 23 24 27 28 29   GLUCOSE 89 81 76 105* 94  BUN 6 5* 5* 5* 7  CREATININE 0.58 0.52 0.54 0.58 0.63  CALCIUM 8.7* 8.6* 8.9 8.9 8.9  MG  --  1.8  --   --   --   PHOS  --  2.8  --   --   --    GFR: Estimated Creatinine Clearance: 99.3 mL/min (by C-G formula based on SCr of 0.63 mg/dL). Liver Function Tests: Recent Labs  Lab 08/10/18 1845  AST 77*  ALT 35  ALKPHOS 104  BILITOT 0.8  PROT 6.8  ALBUMIN 3.9   No results for input(s): LIPASE, AMYLASE in the last 168 hours. No results for input(s): AMMONIA in the last 168 hours. Coagulation Profile: Recent Labs  Lab 08/11/18 0453 08/12/18 0533  INR 0.9 0.9   Cardiac Enzymes: No results for input(s): CKTOTAL, CKMB, CKMBINDEX, TROPONINI in the last 168 hours. BNP (last 3 results) No results for input(s): PROBNP in the last 8760 hours. HbA1C: No results for input(s): HGBA1C in the last 72 hours. CBG: No results for input(s): GLUCAP in the last 168 hours. Lipid Profile: No results for input(s): CHOL, HDL, LDLCALC, TRIG,  CHOLHDL, LDLDIRECT in the last 72 hours. Thyroid Function Tests: Recent Labs    08/10/18 2341  TSH 0.676   Anemia Panel: No results for input(s): VITAMINB12, FOLATE, FERRITIN, TIBC, IRON, RETICCTPCT in the last 72 hours.    Radiology Studies: I have reviewed all of the imaging during this hospital visit personally     Scheduled Meds: . aspirin EC  81 mg Oral Daily  . atorvastatin  10 mg Oral QPM  . folic acid  1 mg Oral Daily  . gabapentin  100 mg Oral QHS  . loratadine  10 mg Oral QHS  . LORazepam  0-4 mg Oral Q6H   Followed by  . [START ON 08/13/2018] LORazepam  0-4 mg Oral Q12H  . magnesium oxide  400 mg Oral BID  . multivitamin with minerals  1 tablet Oral Daily  . potassium chloride  40 mEq Oral Q4H  . thiamine  100 mg Oral Daily  . traZODone  150 mg Oral QHS  . venlafaxine XR  75 mg Oral QHS   Continuous Infusions: . sodium chloride 50 mL/hr at 08/12/18 1045     LOS: 2 days        Mauricio Annett Gulaaniel Arrien, MD

## 2018-08-13 DIAGNOSIS — E876 Hypokalemia: Secondary | ICD-10-CM

## 2018-08-13 DIAGNOSIS — E871 Hypo-osmolality and hyponatremia: Secondary | ICD-10-CM

## 2018-08-13 DIAGNOSIS — R262 Difficulty in walking, not elsewhere classified: Secondary | ICD-10-CM

## 2018-08-13 DIAGNOSIS — E872 Acidosis: Secondary | ICD-10-CM

## 2018-08-13 DIAGNOSIS — E8729 Other acidosis: Secondary | ICD-10-CM

## 2018-08-13 LAB — BASIC METABOLIC PANEL
Anion gap: 11 (ref 5–15)
BUN: 7 mg/dL (ref 6–20)
CO2: 26 mmol/L (ref 22–32)
Calcium: 9 mg/dL (ref 8.9–10.3)
Chloride: 97 mmol/L — ABNORMAL LOW (ref 98–111)
Creatinine, Ser: 0.63 mg/dL (ref 0.44–1.00)
GFR calc Af Amer: >60 mL/min (ref >60)
GFR calc non Af Amer: >60 mL/min (ref >60)
Glucose, Bld: 109 mg/dL — ABNORMAL HIGH (ref 70–99)
Potassium: 3.9 mmol/L (ref 3.5–5.1)
Sodium: 134 mmol/L — ABNORMAL LOW (ref 135–145)

## 2018-08-13 LAB — PROTIME-INR
INR: 0.8 (ref 0.8–1.2)
Prothrombin Time: 11.5 seconds (ref 11.4–15.2)

## 2018-08-13 NOTE — TOC Initial Note (Addendum)
Transition of Care [facility](TOC) - Initial/Assessment Note    Patient Details  Name: Susan Todd MRN: [id number] Date of Birth: 05/20/1965  Transition of Care [facility](TOC) CM/SW Contact:    Clearance CootsNicole A Alba Perillo, LCSW Phone Number: 08/13/2018, 10:53 AM  Clinical Narrative:                 CSW discussed discharge planning with the patient. Patient reports she is agreeable to Home Health for physical therapy. CSW provided a list of Home Health Agencies, patient chose [facility]. [facility]   Re: Substance Use: Patient express she drinks "several beers a day." Patient reports she has been drinking for years. Patient reports she has [facility] int he past. She reports she was sober for a month. Patient saw a therapist at [facility] about five years ago. She is currently taking antidepressants. Patient reports she is not ready to commit to SA treatment or therapy at this time. [facility] and counseling options.   Expected Discharge Plan: Home w Home Health Services Barriers to Discharge: No Barriers Identified   Patient Goals and CMS Choice Patient states their goals for this hospitalization and ongoing recovery are:: Home CMS Medicare.gov Compare Post Acute Care list provided to:: Patient Choice offered to / list presented to : Patient  Expected Discharge Plan and Services Expected Discharge Plan: Home w Home Health Services   Discharge Planning Services: CM Consult   Living arrangements for the past 2 months: Single Family Home Expected Discharge Date: 08/13/18                         HH Arranged: PT HH Agency: [facility] Date HH Agency Contacted: 08/13/18 Time HH Agency Contacted: 1052 [facility] Agency: Clydie BraunKaren  Prior Living Arrangements/Services Living arrangements for the past 2 months: Single Family Home Lives with::  Self Patient language and need for interpreter reviewed:: No Do you feel safe going back to the place where you live?: Yes      Need for Family Participation in Patient Care: No (Comment) Care giver support system in place?: No (comment)   Criminal Activity/Legal Involvement Pertinent to Current Situation/Hospitalization: No - Comment as needed  Activities of Daily Living Home Assistive Devices/Equipment: Eyeglasses ADL Screening (condition at time of admission) Patient's cognitive ability adequate to safely complete daily activities?: Yes Is the patient deaf or have difficulty hearing?: No Does the patient have difficulty seeing, even when wearing glasses/contacts?: No Does the patient have difficulty concentrating, remembering, or making decisions?: Yes Patient able to express need for assistance with ADLs?: Yes Does the patient have difficulty dressing or bathing?: No Independently performs ADLs?: Yes (appropriate for developmental age) Does the patient have difficulty walking or climbing stairs?: No Weakness of Legs: Both Weakness of Arms/Hands: None  Permission Sought/Granted Permission sought to share information with : Case Manager, Other (comment) Permission granted to share information with : Yes, Verbal Permission Granted     Permission granted to share info w AGENCY: Home Health Agency        Emotional Assessment Appearance:: Appears stated age   Affect (typically observed): Accepting, Pleasant Orientation: : Oriented to Self, Oriented to Place, Oriented to  Time, Oriented to Situation Alcohol / Substance Use: Not Applicable Psych Involvement: No (comment)  Admission diagnosis:  Weakness Patient Active Problem List  Diagnosis Date Noted  . Alcoholic ketoacidosis 04/15/3610  . Hyponatremia 08/13/2018  . Hypokalemia 08/13/2018  . Hyponatremia with normal extracellular fluid volume 08/10/2018  . Seizure ([place]) 11/04/2016  . Seizures ([place]) 11/03/2016  . COPD  (chronic obstructive pulmonary disease) ([place]) 11/03/2016  . Alcohol abuse 11/03/2016  . Cocaine use 11/03/2016  . Shortness of breath-with exertion 09/23/2013  . Occlusion and stenosis of carotid artery without mention of cerebral infarction 08/22/2011  . Depression   . Anxiety   . Arthritis    PCP:  Wenda Low, MD Pharmacy:   [facility], [place] [address] Phone: [phone] Fax: [phone]     Social Determinants of Health (SDOH) Interventions    Readmission Risk Interventions No flowsheet data found.

## 2018-08-13 NOTE — Progress Notes (Signed)
Physical Therapy Treatment Patient Details Name: Susan Todd MRN: [id number] DOB: April 06, 1965 Today's Date: 08/13/2018    History of Present Illness 53 yo female admitted with hyponatremia, fall, L ankle sprain. Hx of ETOH abuse, polysubstance abuse, Afib, COPD, Sz    PT Comments    Progressing with mobility. Pt remains unsteady. LOB x1 requiring assist from therapist to prevent fall. Discussed d/c plan-pt plans to return home. She feels she will manage at home just fine with help from her roommate. She declines RW. Plan is for d/c home today.    Follow Up Recommendations  Home health PT;Supervision for mobility/OOB     Equipment Recommendations  None recommended by PT(pt declines RW)    Recommendations for Other Services       Precautions / Restrictions Precautions Precautions: Fall Restrictions Weight Bearing Restrictions: No    Mobility  Bed Mobility Overal bed mobility: Modified Independent                Transfers Overall transfer level: Needs assistance   Transfers: Sit to/from Stand Sit to Stand: Supervision         General transfer comment: for safety  Ambulation/Gait Ambulation/Gait assistance: Min guard;Min assist Gait Distance (Feet): 120 Feet Assistive device: None Gait Pattern/deviations: Decreased stride length     General Gait Details: LOB x 1 while changing direction(180 degree turn). Assist provided to prevent fall. Otherwise, pt was Min guard assist.   Stairs             Wheelchair Mobility    Modified Rankin (Stroke Patients Only)       Balance Overall balance assessment: History of Falls;Needs assistance           Standing balance-Leahy Scale: Fair                              Cognition Arousal/Alertness: Awake/alert Behavior During Therapy: WFL for tasks assessed/performed Overall Cognitive Status: Within Functional Limits for tasks assessed                                        Exercises      General Comments        Pertinent Vitals/Pain Pain Assessment: Faces Faces Pain Scale: Hurts little more Pain Location: generalized Pain Descriptors / Indicators: Sore Pain Intervention(s): Monitored during session    Home Living                      Prior Function            PT Goals (current goals can now be found in the care plan section) Progress towards PT goals: Progressing toward goals    Frequency    Min 3X/week      PT Plan Current plan remains appropriate    Co-evaluation              AM-PAC PT "6 Clicks" Mobility   Outcome Measure  Help needed turning from your back to your side while in a flat bed without using bedrails?: None Help needed moving from lying on your back to sitting on the side of a flat bed without using bedrails?: None Help needed moving to and from a bed to a chair (including a wheelchair)?: A Little Help needed standing up from a chair using your arms (e.g., wheelchair or bedside  chair)?: A Little Help needed to walk in hospital room?: A Little Help needed climbing 3-5 steps with a railing? : A Little 6 Click Score: 20    End of Session Equipment Utilized During Treatment: Gait belt Activity Tolerance: Patient tolerated treatment well Patient left: in bed;with call bell/phone within reach;with bed alarm set   PT Visit Diagnosis: Unsteadiness on feet (R26.81)     Time: [id number] PT Time Calculation (min) (ACUTE ONLY): 8 min  Charges:  $Gait Training: 8-22 mins                        Rebeca AlertJannie Marnette Perkins, PT Acute Rehabilitation Services Pager: [phone] Office: [phone]

## 2018-08-13 NOTE — Progress Notes (Signed)
Pt has had 2 loose, watery stools on 06/21 night shift, and pt reported 4 loose, watery stools earlier in the evening. No temp, no abd pain, WBC WNL.

## 2018-08-13 NOTE — Discharge Summary (Addendum)
Physician Discharge Summary  Susan Todd WJX:[id number] DOB: October 24, 1965 DOA: 08/10/2018  PCP: Georgann Housekeeper, MD  Admit date: 08/10/2018 Discharge date: 08/13/2018  Admitted From: Home  Disposition:  Home   Recommendations for Outpatient Follow-up and new medication changes:  1. Follow up with Dr. Donette Larry in 7 days.  2. Patient was advised about alcohol consumption cessation.   Home Health: yes   Equipment/Devices: walker   Discharge Condition: stable  CODE STATUS: full  Diet recommendation: heart healthy   Brief/Interim Summary: 53 year old female who presented with fatigue and a mechanical fall. She does have significant past medical history for atrial fibrillation, anxiety/depression, COPD, hypertension, seizures, alcohol and polysubstance abuse. Patient sustained a mechanical fall while walking, landing on her knees, no head trauma loss of consciousness. Patient was actively drinking alcohol.On her initial physical examination blood pressure 131/87, pulse rate 99, respiratory rate 15, temperature 98.7, oxygen saturation 94%. She had dry mucous membranes, her lungs were clear to auscultation bilaterally, heart S1-S2 present and rhythmic, abdomen was soft nondistended, no lower extremity edema. Neurologically patient was nonfocal. Sodium 113, potassium 3.9, chloride 73, bicarb 23, glucose 89, BUN 6, creatinine 0.58, calcium 8.7, anion gap 17, white count 7.3, hemoglobin 11.7, hematocrit 32.0, platelets 240.Urinalysis specific gravity 1.004, urine osmolality 173, urine potassium 15, urine sodium 16.SARS COVID-19 negative.CT head negative for acute changes. Chest radiograph negative for infiltrates. EKG 93 bpm, normal axis, normal intervals, sinus rhythm with normal conduction, no ST segment or T wave changes.  Patient was admitted to the hospital with a working diagnosis of symptomatic hyponatremiarelated withalcohol intoxication complicated with alcoholic ketoacidosis and AKI.    1.  Severe hyponatremia complicated with hypokalemia and anion gap metabolic acidosis, alcoholic ketoacidosis.  Likely related to alcohol intoxication, beer potomania.  Patient was admitted to the medical ward, she was placed on a remote telemetry monitor, received isotonic saline intravenously with a slow correction of serum sodium, potassium was corrected with potassium chloride.  She responded well to the medical therapy with resolving acidosis and electrolyte disturbances.  Her discharge sodium is 134, potassium 3.9, bicarbonate 26, serum creatinine 0.63.   2.  Alcohol abuse.  Patient was monitored with frequent neuro checks, she was placed on lorazepam, CIWA protocol.  She did not show significant signs of alcohol withdrawal.  Patient received multivitamins along with thiamine.  3.  Ambulatory dysfunction.  Patient was evaluated by physical therapy, she required a rolling walker for ambulation, she will continue home health services.  4.  Paroxysmal atrial fibrillation.  Patient remained rate controlled, no anticoagulation due to fall risk related to alcohol abuse.  Continue aspirin.  5.  Hypertension.  Her antihypertensive agents were held during her hospitalization, her blood pressure remained well controlled.  At discharge she will resume losartan/ hctz combination.   6.  Obesity/ dyslipidemia.  Calculated BMI is 30.1, she will need lifestyle modifications. Continue atorvastatin.   7.  Chronic back pain.  Patient received ibuprofen and prophylactic pantoprazole.  8. Depression/ anxiety. Continue desvenlafaxine, trazosone and lorazepam.    Discharge Diagnoses:  Principal Problem:   Hyponatremia Active Problems:   Depression   Anxiety   Alcohol abuse   Alcoholic ketoacidosis   Hypokalemia    Discharge Instructions   Allergies as of 08/13/2018      Reactions   Codeine Nausea And Vomiting   Latex Hives      Medication List    STOP taking these medications   folic acid  1 MG tablet Commonly known  as: FOLVITE   thiamine 100 MG tablet     TAKE these medications   acetaminophen 500 MG tablet Commonly known as: TYLENOL Take 1,500 mg by mouth every 6 (six) hours as needed for mild pain or headache.   aspirin EC 81 MG tablet Take 81 mg by mouth daily.   atorvastatin 10 MG tablet Commonly known as: LIPITOR Take 10 mg by mouth every evening.   cetirizine 5 MG tablet Commonly known as: ZYRTEC Take 5 mg by mouth at bedtime.   desvenlafaxine 50 MG 24 hr tablet Commonly known as: PRISTIQ Take 50 mg by mouth at bedtime.   gabapentin 100 MG capsule Commonly known as: NEURONTIN Take 100 mg by mouth at bedtime.   LORazepam 1 MG tablet Commonly known as: ATIVAN Take 1 mg by mouth every 8 (eight) hours as needed for anxiety.   losartan-hydrochlorothiazide 50-12.5 MG tablet Commonly known as: HYZAAR Take 1 tablet by mouth daily.   magnesium oxide 400 (241.3 Mg) MG tablet Commonly known as: MAG-OX Take 1 tablet (400 mg total) by mouth 2 (two) times daily. What changed: when to take this   traZODone 150 MG tablet Commonly known as: DESYREL Take 150 mg by mouth at bedtime.            Durable Medical Equipment  (From admission, onward)         Start     Ordered   08/13/18 0000  For home use only DME Walker    Question:  Patient needs a walker to treat with the following condition  Answer:  Ambulatory dysfunction   08/13/18 1008          Allergies  Allergen Reactions  . Codeine Nausea And Vomiting  . Latex Hives    Consultations:     Procedures/Studies: Dg Chest 2 View  Result Date: 08/10/2018 CLINICAL DATA:  Generalized weakness and multiple falls over the past week. EXAM: CHEST - 2 VIEW COMPARISON:  None. FINDINGS: Lungs clear. The chest is hyperexpanded with attenuation of the pulmonary vasculature. Heart size normal. No pneumothorax or pleural fluid. No bony abnormality. IMPRESSION: No acute disease. COPD. Electronically  Signed   By: Drusilla Kannerhomas  Dalessio M.D.   On: 08/10/2018 21:52   Dg Ankle Complete Left  Result Date: 08/10/2018 CLINICAL DATA:  Recurrent falls over the past week. Left ankle pain. Initial encounter. EXAM: LEFT ANKLE COMPLETE - 3+ VIEW COMPARISON:  Plain films left ankle 04/29/2009. FINDINGS: Soft tissues about the ankle appear swollen. No underlying bony or joint abnormality is seen. IMPRESSION: Soft tissue swelling without underlying bony or joint abnormality. Electronically Signed   By: Drusilla Kannerhomas  Dalessio M.D.   On: 08/10/2018 19:40   Ct Head Wo Contrast  Result Date: 08/10/2018 CLINICAL DATA:  Generalized weakness. History of multiple falls last week. EXAM: CT HEAD WITHOUT CONTRAST TECHNIQUE: Contiguous axial images were obtained from the base of the skull through the vertex without intravenous contrast. COMPARISON:  Head CT 11/03/2016 FINDINGS: Brain: Stable mild age advanced cerebral atrophy, ventriculomegaly and periventricular white matter disease. No acute intracranial findings such as hemispheric infarction or intracranial hemorrhage. No mass lesions. No extra-axial fluid collections are identified. The brainstem and cerebellum are grossly normal. Vascular: Scattered vascular calcifications. No definite aneurysm or hyperdense vessels. Skull: No skull fracture or bone lesions. Sinuses/Orbits: The paranasal sinuses and mastoid air cells are clear. The globes are intact. Other: No scalp lesions or hematoma. IMPRESSION: No acute intracranial findings or mass lesions. Electronically Signed   By:  Marijo Sanes M.D.   On: 08/10/2018 20:26   Dg Foot Complete Left  Result Date: 08/10/2018 CLINICAL DATA:  Left foot pain. History of several falls over the past week. Initial encounter. EXAM: LEFT FOOT - COMPLETE 3+ VIEW COMPARISON:  Plain films left great toe 04/29/2009. FINDINGS: No acute bony or joint abnormality is identified. The patient is status post hallux valgus resection. Soft tissues of the foot appear  mildly swollen. IMPRESSION: Mild soft tissue swelling without underlying acute bony or joint abnormality. Electronically Signed   By: Inge Rise M.D.   On: 08/10/2018 19:41      Procedures:   Subjective: Patient is feeling well, no chest pain or dyspnea, had intermittent diarrhea last night. No nausea or vomiting, no tremors or anxiety.   Discharge Exam: Vitals:   08/13/18 0003 08/13/18 0557  BP: 133/86 119/74  Pulse: 79 81  Resp: 20 19  Temp: 98.4 F (36.9 C) 98.4 F (36.9 C)  SpO2: 98% 98%   Vitals:   08/12/18 1545 08/12/18 1820 08/13/18 0003 08/13/18 0557  BP:  (!) 135/95 133/86 119/74  Pulse: 90 79 79 81  Resp:  20 20 19   Temp:  97.6 F (36.4 C) 98.4 F (36.9 C) 98.4 F (36.9 C)  TempSrc:  Oral Oral Oral  SpO2:  99% 98% 98%  Weight:      Height:        General: Not in pain or dyspnea  Neurology: Awake and alert, non focal  E ENT: no pallor, no icterus, oral mucosa moist Cardiovascular: No JVD. S1-S2 present, rhythmic, no gallops, rubs, or murmurs. No lower extremity edema. Pulmonary: positive breath sounds bilaterally, adequate air movement, no wheezing, rhonchi or rales. Gastrointestinal. Abdomen with no organomegaly, non tender, no rebound or guarding Skin. No rashes/ multiple ecchymosis, extremities, hematoma on left distal leg.  Musculoskeletal: no joint deformities   The results of significant diagnostics from this hospitalization (including imaging, microbiology, ancillary and laboratory) are listed below for reference.     Microbiology: Recent Results (from the past 240 hour(s))  Urine culture     Status: None   Collection Time: 08/10/18  8:27 PM   Specimen: Urine, Random  Result Value Ref Range Status   Specimen Description   Final    URINE, RANDOM Performed at [facility] [address], [place], [place] [address]    Special Requests   Final    NONE Performed at [facility], [place] [address]  [place], [place] [address]    Culture   Final    NO GROWTH Performed at [facility], [place] [address], [place], [place] [address]    Report Status 08/12/2018 FINAL  Final  SARS Coronavirus 2 (CEPHEID - Performed in [facility] hospital lab), Hosp Order     Status: None   Collection Time: 08/10/18  8:27 PM   Specimen: Nasopharyngeal Swab  Result Value Ref Range Status   SARS Coronavirus 2 NEGATIVE NEGATIVE Final    Comment: (NOTE) If result is NEGATIVE SARS-CoV-2 target nucleic acids are NOT DETECTED. The SARS-CoV-2 RNA is generally detectable in upper and lower  respiratory specimens during the acute phase of infection. The lowest  concentration of SARS-CoV-2 viral copies this assay can detect is 250  copies / mL. A negative result does not preclude SARS-CoV-2 infection  and should not be used as the sole basis for treatment or other  patient management decisions.  A negative result may occur  with  improper specimen collection / handling, submission of specimen other  than nasopharyngeal swab, presence of viral mutation(s) within the  areas targeted by this assay, and inadequate number of viral copies  (<250 copies / mL). A negative result must be combined with clinical  observations, patient history, and epidemiological information. If result is POSITIVE SARS-CoV-2 target nucleic acids are DETECTED. The SARS-CoV-2 RNA is generally detectable in upper and lower  respiratory specimens dur ing the acute phase of infection.  Positive  results are indicative of active infection with SARS-CoV-2.  Clinical  correlation with patient history and other diagnostic information is  necessary to determine patient infection status.  Positive results do  not rule out bacterial infection or co-infection with other viruses. If result is PRESUMPTIVE POSTIVE SARS-CoV-2 nucleic acids MAY BE PRESENT.   A presumptive positive result was obtained on the submitted specimen  and confirmed on repeat  testing.  While 2019 novel coronavirus  (SARS-CoV-2) nucleic acids may be present in the submitted sample  additional confirmatory testing may be necessary for epidemiological  and / or clinical management purposes  to differentiate between  SARS-CoV-2 and other Sarbecovirus currently known to infect humans.  If clinically indicated additional testing with an alternate test  methodology [phone]([id number] is advised. The SARS-CoV-2 RNA is generally  detectable in upper and lower respiratory sp ecimens during the acute  phase of infection. The expected result is Negative. Fact Sheet for Patients:  BoilerBrush.com.cyhttps://www.fda.gov/media/136312/download Fact Sheet for Healthcare Providers: https://pope.com/https://www.fda.gov/media/136313/download This test is not yet approved or cleared by the [place] FDA and has been authorized for detection and/or diagnosis of SARS-CoV-2 by FDA under an Emergency Use Authorization (EUA).  This EUA will remain in effect (meaning this test can be used) for the duration of the COVID-19 declaration under Section 564(b)(1) of the Act, 21 U.S.C. section 360bbb-3(b)(1), unless the authorization is terminated or revoked sooner. Performed at [facility], [address] [place], [place] [address]   MRSA PCR Screening     Status: None   Collection Time: 08/11/18  3:08 AM   Specimen: Nasopharyngeal  Result Value Ref Range Status   MRSA by PCR NEGATIVE NEGATIVE Final    Comment:        The GeneXpert MRSA Assay (FDA approved for NASAL specimens only), is one component of a comprehensive MRSA colonization surveillance program. It is not intended to diagnose MRSA infection nor to guide or monitor treatment for MRSA infections. Performed at [facility], [address] [place], [place] [address]      Labs: BNP (last 3 results) No results for input(s): BNP in the last 8760 hours. Basic Metabolic Panel: Recent Labs  Lab 08/10/18 2341  08/11/18 0453 08/11/18 1114 08/12/18 0533 08/13/18 0556  NA 117* 123* 123* 129* 134*  K 3.3* 3.3* 3.5 2.9* 3.9  CL 78* 80* 81* 87* 97*  CO2 24 27 28 29 26   GLUCOSE 81 76 105* 94 109*  BUN 5* 5* 5* 7 7  CREATININE 0.52 0.54 0.58 0.63 0.63  CALCIUM 8.6* 8.9 8.9 8.9 9.0  MG 1.8  --   --   --   --   PHOS 2.8  --   --   --   --    Liver Function Tests: Recent Labs  Lab 08/10/18 1845  AST 77*  ALT 35  ALKPHOS 104  BILITOT 0.8  PROT 6.8  ALBUMIN 3.9   No results for input(s): LIPASE, AMYLASE in the  last 168 hours. No results for input(s): AMMONIA in the last 168 hours. CBC: Recent Labs  Lab 08/10/18 1845 08/11/18 0453  WBC 7.3 3.5*  NEUTROABS 4.8  --   HGB 11.7* 11.6*  HCT 32.0* 32.8*  MCV 96.1 98.5  PLT 240 190   Cardiac Enzymes: No results for input(s): CKTOTAL, CKMB, CKMBINDEX, TROPONINI in the last 168 hours. BNP: Invalid input(s): POCBNP CBG: No results for input(s): GLUCAP in the last 168 hours. D-Dimer No results for input(s): DDIMER in the last 72 hours. Hgb A1c No results for input(s): HGBA1C in the last 72 hours. Lipid Profile No results for input(s): CHOL, HDL, LDLCALC, TRIG, CHOLHDL, LDLDIRECT in the last 72 hours. Thyroid function studies Recent Labs    08/10/18 2341  TSH 0.676   Anemia work up No results for input(s): VITAMINB12, FOLATE, FERRITIN, TIBC, IRON, RETICCTPCT in the last 72 hours. Urinalysis    Component Value Date/Time   COLORURINE STRAW (A) 08/10/2018 2027   APPEARANCEUR CLEAR 08/10/2018 2027   LABSPEC 1.004 (L) 08/10/2018 2027   PHURINE 5.0 08/10/2018 2027   GLUCOSEU NEGATIVE 08/10/2018 2027   HGBUR SMALL (A) 08/10/2018 2027   BILIRUBINUR NEGATIVE 08/10/2018 2027   KETONESUR 5 (A) 08/10/2018 2027   PROTEINUR NEGATIVE 08/10/2018 2027   UROBILINOGEN 0.2 08/23/2006 1144   NITRITE NEGATIVE 08/10/2018 2027   LEUKOCYTESUR NEGATIVE 08/10/2018 2027   Sepsis Labs Invalid input(s): PROCALCITONIN,  WBC,   LACTICIDVEN Microbiology Recent Results (from the past 240 hour(s))  Urine culture     Status: None   Collection Time: 08/10/18  8:27 PM   Specimen: Urine, Random  Result Value Ref Range Status   Specimen Description   Final    URINE, RANDOM Performed at [facility], [address] [place], [place] [address]    Special Requests   Final    NONE Performed at [facility], [address] [place], [place] [address]    Culture   Final    NO GROWTH Performed at [facility], [address], [place], [place] [address]    Report Status 08/12/2018 FINAL  Final  SARS Coronavirus 2 (CEPHEID - Performed in [facility] hospital lab), Hosp Order     Status: None   Collection Time: 08/10/18  8:27 PM   Specimen: Nasopharyngeal Swab  Result Value Ref Range Status   SARS Coronavirus 2 NEGATIVE NEGATIVE Final    Comment: (NOTE) If result is NEGATIVE SARS-CoV-2 target nucleic acids are NOT DETECTED. The SARS-CoV-2 RNA is generally detectable in upper and lower  respiratory specimens during the acute phase of infection. The lowest  concentration of SARS-CoV-2 viral copies this assay can detect is 250  copies / mL. A negative result does not preclude SARS-CoV-2 infection  and should not be used as the sole basis for treatment or other  patient management decisions.  A negative result may occur with  improper specimen collection / handling, submission of specimen other  than nasopharyngeal swab, presence of viral mutation(s) within the  areas targeted by this assay, and inadequate number of viral copies  (<250 copies / mL). A negative result must be combined with clinical  observations, patient history, and epidemiological information. If result is POSITIVE SARS-CoV-2 target nucleic acids are DETECTED. The SARS-CoV-2 RNA is generally detectable in upper and lower  respiratory specimens dur ing the acute phase of infection.  Positive  results are  indicative of active infection with SARS-CoV-2.  Clinical  correlation with  patient history and other diagnostic information is  necessary to determine patient infection status.  Positive results do  not rule out bacterial infection or co-infection with other viruses. If result is PRESUMPTIVE POSTIVE SARS-CoV-2 nucleic acids MAY BE PRESENT.   A presumptive positive result was obtained on the submitted specimen  and confirmed on repeat testing.  While 2019 novel coronavirus  (SARS-CoV-2) nucleic acids may be present in the submitted sample  additional confirmatory testing may be necessary for epidemiological  and / or clinical management purposes  to differentiate between  SARS-CoV-2 and other Sarbecovirus currently known to infect humans.  If clinically indicated additional testing with an alternate test  methodology [phone]) is advised. The SARS-CoV-2 RNA is generally  detectable in upper and lower respiratory sp ecimens during the acute  phase of infection. The expected result is Negative. Fact Sheet for Patients:  BoilerBrush.com.cyhttps://www.fda.gov/media/136312/download Fact Sheet for Healthcare Providers: https://pope.com/https://www.fda.gov/media/136313/download This test is not yet approved or cleared by the [place] FDA and has been authorized for detection and/or diagnosis of SARS-CoV-2 by FDA under an Emergency Use Authorization (EUA).  This EUA will remain in effect (meaning this test can be used) for the duration of the COVID-19 declaration under Section 564(b)(1) of the Act, 21 U.S.C. section 360bbb-3(b)(1), unless the authorization is terminated or revoked sooner. Performed at [facility], [address] [place], [place] [address]   MRSA PCR Screening     Status: None   Collection Time: 08/11/18  3:08 AM   Specimen: Nasopharyngeal  Result Value Ref Range Status   MRSA by PCR NEGATIVE NEGATIVE Final    Comment:        The GeneXpert MRSA Assay (FDA approved for NASAL  specimens only), is one component of a comprehensive MRSA colonization surveillance program. It is not intended to diagnose MRSA infection nor to guide or monitor treatment for MRSA infections. Performed at [facility], [address] [place], [place] [address]      Time coordinating discharge: 45 minutes  SIGNED:   Coralie KeensMauricio Daniel , MD  [facility] 08/13/2018, 9:52 AM

## 2018-08-13 NOTE — Progress Notes (Signed)
Patient has discharged to home with [facility] on 08/13/2018. SW is notified. Discharge instruction including medication and appointment was given to patient. Patient has no question at this time.

## 2019-08-15 ENCOUNTER — Other Ambulatory Visit: Payer: Self-pay

## 2019-08-16 ENCOUNTER — Encounter: Payer: Self-pay | Admitting: Obstetrics and Gynecology

## 2019-08-16 ENCOUNTER — Ambulatory Visit (INDEPENDENT_AMBULATORY_CARE_PROVIDER_SITE_OTHER): Payer: Self-pay | Admitting: Obstetrics and Gynecology

## 2019-08-16 VITALS — BP 122/80 | Ht 69.0 in | Wt 187.0 lb

## 2019-08-16 DIAGNOSIS — Z124 Encounter for screening for malignant neoplasm of cervix: Secondary | ICD-10-CM

## 2019-08-16 DIAGNOSIS — Z1151 Encounter for screening for human papillomavirus (HPV): Secondary | ICD-10-CM

## 2019-08-16 DIAGNOSIS — N898 Other specified noninflammatory disorders of vagina: Secondary | ICD-10-CM

## 2019-08-16 LAB — WET PREP FOR TRICH, YEAST, CLUE

## 2019-08-16 MED ORDER — CLINDAMYCIN PHOSPHATE 2 % VA CREA: 1.0000 | TOPICAL_CREAM | Freq: Every day | VAGINAL | 0 refills | Status: AC

## 2019-08-16 NOTE — Progress Notes (Signed)
Susan Todd 25-Jul-1965 [id number]  SUBJECTIVE:  54 y.o. [id number] female last seen in this office in 2016 who presents for evaluation of about a 34-month history of grayish vaginal discharge, more recently with blood mixed in with it.  She also has been having midline lower abdominal pain pretty much constantly in the last few months.  She started to notice blood mixed in with the discharge as of this past Sunday morning about 5 days ago.  She is not sexually active and has not been since at least 5 years ago, which is about when she last had a pelvic exam.  She is a smoker and admits to alcoholism.  Not on any hormones.  Denies any recent medication changes.  She says she was seen at [facility] for initial evaluation and took ciprofloxacin for about 3 days for presumed UTI but the culture ultimately returned negative.  She says the abdominal pain was more notable after using the antibiotic.  She was prescribed metronidazole by the provider there but ultimately did not take it due to knowing that it would interact with her alcohol consumption.   Current Outpatient Medications  Medication Sig Dispense Refill  . acetaminophen (TYLENOL) 500 MG tablet Take 1,500 mg by mouth every 6 (six) hours as needed for mild pain or headache.    Marland Kitchen aspirin EC 81 MG tablet Take 81 mg by mouth daily.    Marland Kitchen atorvastatin (LIPITOR) 10 MG tablet Take 10 mg by mouth every evening.     . cetirizine (ZYRTEC) 5 MG tablet Take 5 mg by mouth at bedtime.    . gabapentin (NEURONTIN) 100 MG capsule Take 100 mg by mouth at bedtime.    Marland Kitchen losartan (COZAAR) 25 MG tablet Take 25 mg by mouth daily.    . magnesium oxide (MAG-OX) 400 (241.3 Mg) MG tablet Take 1 tablet (400 mg total) by mouth 2 (two) times daily. (Patient taking differently: Take 400 mg by mouth daily. ) 10 tablet 0  . metoprolol tartrate (LOPRESSOR) 25 MG tablet Take 25 mg by mouth 2 (two) times daily.    Marland Kitchen LORazepam (ATIVAN) 1 MG tablet Take 1 mg by mouth every 8  (eight) hours as needed for anxiety.  (Patient not taking: Reported on 08/16/2019)     No current facility-administered medications for this visit.   Allergies: Codeine and Latex  Patient's last menstrual period was 11/20/2012.  Past medical history,surgical history, problem list, medications, allergies, family history and social history were all reviewed and documented as reviewed in the EPIC chart.  ROS:  Feeling well. No dyspnea or chest pain on exertion.  No abdominal pain, change in bowel habits, black or bloody stools.  No urinary tract symptoms. GYN ROS: as described in HPI   OBJECTIVE:  BP 122/80   Ht 5\' 9"  (1.753 m)   Wt 187 lb (84.8 kg)   LMP 11/20/2012   BMI 27.62 kg/m  The patient appears well, alert, oriented x 3, in no distress. PELVIC EXAM: VULVA: normal appearing vulva with no masses, tenderness or lesions, VAGINA: normal atrophic appearing vagina with normal color, no lesions, 10 ml of thick brownish/mauve discharge is present in vaginal vault, no odor, no sign of active bleeding or bleeding vaginal lesions, CERVIX: normal atrophic appearing cervix without discharge or lesions, no active bleeding from external os, UTERUS: uterus is normal size, shape, consistency but is TENDER, ADNEXA: normal adnexa in size, nontender and no masses PAP: Pap smear done today, thin-prep method,  WET MOUNT done - results: negative for pathogens, normal epithelial cells, WBC too numerous to count  Chaperone: Caryn Bee present during the examination  ASSESSMENT:  54 y.o. [id number] here for vaginal discharge with slight blood-tinge and lower midline pelvic discomfort of uncertain etiology   PLAN:  Vaginal wet mount is negative.  Given the amount of discharge and numerous WBC on the wet prep slide, I will empirically treat her for bacterial vaginosis, selecting Cleocin 2 percent vaginal cream nightly application for 7 nights.  Could also consider adding doxycycline for empiric endometritis  treatment if the Cleocin cream is not successful at improving her symptoms.  She does admit to frequent alcohol consumption so trying to avoid metronidazole.   With the ported blood-tinged to discharge, this could be from atrophic vaginitis and chronic inflammation due to the process causing the discharge.  A Pap smear/HPV is collected today since she is overdue.  No sign of bleeding or blood mixed in the discharge on exam today, but if there are continued bleeding signs, then I would recommend she let us know right away and we will want to perform pelvic ultrasound to evaluate the uterine lining, we discussed the possibility of endometrial hyperplasia or cancer if there is postmenopausal bleeding.  She acknowledges understanding and will let us know if that is occurring.   Susan Pierini MD 08/16/19

## 2019-08-20 ENCOUNTER — Telehealth: Payer: Self-pay | Admitting: *Deleted

## 2019-08-20 LAB — PAP IG AND HPV HIGH-RISK: HPV DNA High Risk: NOT DETECTED

## 2019-08-20 NOTE — Telephone Encounter (Signed)
Patient called with questions about recent results posted from visit on 08/16/19. Questions answered, patient said not much improvement with cleocin cream, I recommended she complete medication first then call if no improvement after Rx finished.

## 2019-09-05 ENCOUNTER — Telehealth: Payer: Self-pay | Admitting: *Deleted

## 2019-09-05 MED ORDER — METRONIDAZOLE 0.75 % VA GEL: 1.0000 | Freq: Every day | VAGINAL | 0 refills | Status: DC

## 2019-09-05 MED ORDER — IBUPROFEN 800 MG PO TABS: 800.0000 mg | ORAL_TABLET | Freq: Three times a day (TID) | ORAL | 0 refills | Status: DC | PRN

## 2019-09-05 NOTE — Telephone Encounter (Signed)
Patient called to follow up from OV on 08/16/19 completed the cleocin vaginal cream, still had vaginal discharge, so patient started metronidazole that was prescribed by [facility] and it made her sick, severe diarrhea and worsen stomach pains.  Patient called today to report the yellow discharge today, patient was tearful on phone, she asked do you have any other recommendations about medication? Please advise

## 2019-09-05 NOTE — Telephone Encounter (Signed)
Perhaps Metrogel x 5 nights?

## 2019-09-05 NOTE — Telephone Encounter (Signed)
Can she use Motrin 800 mg every 8 hours as needed #30

## 2019-09-05 NOTE — Telephone Encounter (Signed)
Rx sent, patient informed.

## 2019-09-05 NOTE — Telephone Encounter (Signed)
patient is willing to try metrogel cream, she asked if you could prescribed something for lower abdominal pain?, states feels like a stabbing pain comes and goes. But lately since stopping the metronidazole its been daily. She has tried OTC ibuprofen and tylenol and no improvement. She is not looking for a narcotic,but something that could help with the pain, reports it was so bad last night she couldn't barely sleep.

## 2019-09-16 ENCOUNTER — Ambulatory Visit: Payer: Self-pay | Admitting: Obstetrics and Gynecology

## 2019-09-19 ENCOUNTER — Emergency Department (HOSPITAL_COMMUNITY)
Admission: EM | Admit: 2019-09-19 | Discharge: 2019-09-19 | Disposition: A | Discharge status: Still In Hospital | Payer: BLUE CROSS/BLUE SHIELD | Attending: Emergency Medicine | Admitting: Emergency Medicine

## 2019-09-19 ENCOUNTER — Encounter (HOSPITAL_COMMUNITY): Payer: Self-pay

## 2019-09-19 DIAGNOSIS — R1030 Lower abdominal pain, unspecified: Secondary | ICD-10-CM | POA: Insufficient documentation

## 2019-09-19 DIAGNOSIS — Z5321 Procedure and treatment not carried out due to patient leaving prior to being seen by health care provider: Secondary | ICD-10-CM | POA: Insufficient documentation

## 2019-09-19 DIAGNOSIS — N898 Other specified noninflammatory disorders of vagina: Secondary | ICD-10-CM | POA: Insufficient documentation

## 2019-09-19 LAB — COMPREHENSIVE METABOLIC PANEL
ALT: 7 U/L (ref 0–44)
AST: 13 U/L — ABNORMAL LOW (ref 15–41)
Albumin: 2.7 g/dL — ABNORMAL LOW (ref 3.5–5.0)
Alkaline Phosphatase: 110 U/L (ref 38–126)
Anion gap: 16 — ABNORMAL HIGH (ref 5–15)
BUN: 13 mg/dL (ref 6–20)
CO2: 20 mmol/L — ABNORMAL LOW (ref 22–32)
Calcium: 8.6 mg/dL — ABNORMAL LOW (ref 8.9–10.3)
Chloride: 98 mmol/L (ref 98–111)
Creatinine, Ser: 0.94 mg/dL (ref 0.44–1.00)
GFR calc Af Amer: >60 mL/min (ref >60)
GFR calc non Af Amer: >60 mL/min (ref >60)
Glucose, Bld: 101 mg/dL — ABNORMAL HIGH (ref 70–99)
Potassium: 3.6 mmol/L (ref 3.5–5.1)
Sodium: 134 mmol/L — ABNORMAL LOW (ref 135–145)
Total Bilirubin: 0.3 mg/dL (ref 0.3–1.2)
Total Protein: 6.8 g/dL (ref 6.5–8.1)

## 2019-09-19 LAB — CBC
HCT: 31.3 % — ABNORMAL LOW (ref 36.0–46.0)
Hemoglobin: 10.2 g/dL — ABNORMAL LOW (ref 12.0–15.0)
MCH: 33.8 pg (ref 26.0–34.0)
MCHC: 32.6 g/dL (ref 30.0–36.0)
MCV: 103.6 fL — ABNORMAL HIGH (ref 80.0–100.0)
Platelets: 386 10*3/uL (ref 150–400)
RBC: 3.02 MIL/uL — ABNORMAL LOW (ref 3.87–5.11)
RDW: 14.5 % (ref 11.5–15.5)
WBC: 15.2 10*3/uL — ABNORMAL HIGH (ref 4.0–10.5)
nRBC: 0 % (ref 0.0–0.2)

## 2019-09-19 LAB — LIPASE, BLOOD: Lipase: 18 U/L (ref 11–51)

## 2019-09-19 MED ORDER — SODIUM CHLORIDE 0.9% FLUSH: 3.0000 mL | Freq: Once | INTRAVENOUS | Status: DC

## 2019-09-19 NOTE — ED Notes (Signed)
Pt eloped from waiting area. Called 3X.  

## 2019-09-19 NOTE — ED Triage Notes (Signed)
Pt presents with c/o abdominal pain and vaginal discharge. Pt reports the vaginal discharge has been present for 4-5 months and abdominal pain for 2 months. Pt reports abdominal pain is sharp and in the lower regions. Pt reports minimal relief with meds from home and reports that she uses alcohol to help the pain. Pt was recently treated for BV and recently completed abx. Pt reports symptoms have persisted.

## 2019-09-20 ENCOUNTER — Telehealth: Payer: Self-pay | Admitting: *Deleted

## 2019-09-20 DIAGNOSIS — N898 Other specified noninflammatory disorders of vagina: Secondary | ICD-10-CM

## 2019-09-20 NOTE — Telephone Encounter (Signed)
Her white blood cell count was elevated, she had some mild electrolyte abnormalities, lipase was normal so pancreatitis less likely, but out of context I cannot really comment more than that without knowing what they were looking for.

## 2019-09-20 NOTE — Telephone Encounter (Signed)
Just FYI, Patient called stating she is having blood in vaginal discharge and was told if this should occur to call to schedule pelvic ultrasound. Per office note on 08/16/19 ultrasound order. She is scheduled on 10/10/19.  Dr.Kendall she also when to ER yesterday due to abdominal pain and had labs done and noticed they are flagged abnormal. She was not able to get the results/ recommendations from the hospital because she left due to the long wait to get the results.   Please advise

## 2019-09-23 NOTE — Telephone Encounter (Signed)
Patient informed. 

## 2019-09-26 ENCOUNTER — Other Ambulatory Visit: Payer: Self-pay

## 2019-09-26 ENCOUNTER — Ambulatory Visit (INDEPENDENT_AMBULATORY_CARE_PROVIDER_SITE_OTHER): Payer: Self-pay | Admitting: Obstetrics and Gynecology

## 2019-09-26 ENCOUNTER — Ambulatory Visit (INDEPENDENT_AMBULATORY_CARE_PROVIDER_SITE_OTHER): Payer: Self-pay

## 2019-09-26 ENCOUNTER — Encounter: Payer: Self-pay | Admitting: Obstetrics and Gynecology

## 2019-09-26 VITALS — BP 126/80

## 2019-09-26 DIAGNOSIS — R1900 Intra-abdominal and pelvic swelling, mass and lump, unspecified site: Secondary | ICD-10-CM

## 2019-09-26 DIAGNOSIS — R9389 Abnormal findings on diagnostic imaging of other specified body structures: Secondary | ICD-10-CM

## 2019-09-26 DIAGNOSIS — N898 Other specified noninflammatory disorders of vagina: Secondary | ICD-10-CM

## 2019-09-26 DIAGNOSIS — R109 Unspecified abdominal pain: Secondary | ICD-10-CM

## 2019-09-26 MED ORDER — TRAMADOL HCL 50 MG PO TABS: 50.0000 mg | ORAL_TABLET | Freq: Four times a day (QID) | ORAL | 0 refills | Status: DC | PRN

## 2019-09-26 NOTE — Progress Notes (Signed)
Susan Todd May 23, 1965 [id number]  SUBJECTIVE:  [name] presents for evaluation of ongoing vaginal discharge with increasing brown blood-tinge.  She is tearful today given the uncertainty of her diagnosis.  We brought her in for a pelvic ultrasound due to the blood-tinged discharge.  She is also having intermittent significant lower abdominal pain brought her to the ED for evaluation on 09/19/2019, she says that she got as far as getting a blood draw but then due to a long wait she decided to leave before having further evaluation.    Her blood labs indicated a leukocytosis.  She also had a CMP with some mild electrolyte abnormalities.  Her lipase was normal.  She has a history of heavy alcohol consumption but indicates she has been sober more recently.  No rectal bleeding.  She has never had a colonoscopy.  She does say that she has a bowel movement daily, she has noticed smaller caliber stools she also has not been eating much due to the discomfort.  She was initially seen in this office on 08/16/2019 she was noted to have significant and copious vaginal discharge but no infectious etiology was determined.  She reported not being sexually active for 5 years so STI screening was not performed.  We gave her a Cleocin vaginal cream to use for 7 nights (empiric bacterial vaginosis treatment) which she did do with no improvement in the discharge so then she took the metronidazole from her previous provider prescription and felt very sick on it (despite indicating that she is not consuming alcohol at this time).  She then called in on 09/20/2019 to see if we could interpret her labs from the ER to notify us that the vaginal discharge symptoms did not improve and there was more of a blood-tinged color to the discharge.  We then brought her in for a pelvic ultrasound today.    Current Outpatient Medications  Medication Sig Dispense Refill  . acetaminophen (TYLENOL) 500 MG tablet Take 1,500 mg by  mouth every 6 (six) hours as needed for mild pain or headache.    Marland Kitchen aspirin EC 81 MG tablet Take 81 mg by mouth daily.    Marland Kitchen atorvastatin (LIPITOR) 10 MG tablet Take 10 mg by mouth every evening.     . cetirizine (ZYRTEC) 5 MG tablet Take 5 mg by mouth at bedtime.    . gabapentin (NEURONTIN) 100 MG capsule Take 100 mg by mouth at bedtime.    Marland Kitchen ibuprofen (ADVIL) 800 MG tablet Take 1 tablet (800 mg total) by mouth every 8 (eight) hours as needed. 30 tablet 0  . LORazepam (ATIVAN) 1 MG tablet Take 1 mg by mouth every 8 (eight) hours as needed for anxiety.     Marland Kitchen losartan (COZAAR) 25 MG tablet Take 25 mg by mouth daily.    . metoprolol tartrate (LOPRESSOR) 25 MG tablet Take 25 mg by mouth 2 (two) times daily.    . metroNIDAZOLE (METROGEL) 0.75 % vaginal gel Place 1 Applicatorful vaginally at bedtime. For 5 nights 70 g 0   No current facility-administered medications for this visit.   Allergies: Codeine, Latex, and Metronidazole  Patient's last menstrual period was 11/20/2012.  Past medical history,surgical history, problem list, medications, allergies, family history and social history were all reviewed and documented as reviewed in the EPIC chart.  ROS:  Feeling well. No dyspnea or chest pain on exertion.  + Low abdominal pain, change in bowel habits, black or bloody stools.  No urinary tract symptoms. GYN ROS: As described above in HPI   OBJECTIVE:  BP 126/80 (BP Location: Right Arm, Patient Position: Sitting, Cuff Size: Normal)   LMP 11/20/2012  The patient appears well, alert, oriented x 3, in no distress. PELVIC EXAM: Deferred as this was recently performed Small urine specimen obtained, culture pending  Pelvic ultrasound Anteverted uterus 7.6 x 4.9 x 3.0 cm Normal uterine size and shape, no fibroids.  Symmetrical endometrium slightly thickened at 4.4 mm. Bilateral ovaries difficult to see due to overlying bowel.  There does appear to be a mass with scant Doppler flow in the midline  possibly associated with the bowel measuring 8.0 x 5.4 x 7.1 mm. No pelvic free fluid   ASSESSMENT:  [name] here for abdominal pain and ongoing blood-tinged vaginal discharge, endometrial thickening  PLAN:  1.  Abdominal pain Uncertain as to the etiology.  Ultrasound today indicates possible mass associated with the bowel in the midline, does not appear to be of gynecologic origin but hard to get spatial orientation with inability to clearly see her ovaries.  Given her abdominal pain and history of alcoholism, I highly encouraged her to seek gastroenterology work-up either through [facility] medical care or one of the other local GI groups in town, we can assist with referral if needed.  I also called [place] after the appointment to reiterate that I am not certain what this represents and I would therefore recommend that she get a CT of the abdomen/pelvis to further characterize it.  Also will check urine culture today.  As she is having abdominal pain not responding to Tylenol and ibuprofen I did give her a prescription for tramadol 50 mg #10 no refills she is awaiting further work-up.  2.  Endometrial thickening/blood-tinged vaginal discharge Her endometrial lining is marginally thickened.  I offered to do an endometrial biopsy today but she wanted to wait and we will work-up this abdominal mass and pain first.  There is the possibility of a potential endometrial carcinoma and relation to this indeterminate mass in the pelvis (via metastasis i.e. to the rectum), but her endometrium is only marginally thickened.  The mass is probably more likely related to an intestinal source.  We discussed the possibility of hyperplasia and carcinoma in the uterus and we will need to get an endometrial biopsy when she is able.    Theresia Majors MD 09/26/19

## 2019-09-26 NOTE — Patient Instructions (Addendum)
Need to follow up with GI right away.  You do also need to get a colonoscopy. We talked on the phone after your appointment regarding this finding of a mass in the "cul-de-sac" of the pelvis behind the uterus and cervix, I am not sure if this is related to the ovary/uterus or if it is something from the intestine.  It is fairly large so I will be ordering a CT of the abdomen and pelvis.   Also I would like you to return for the endometrial biopsy at your earliest convenience to check the uterus out for pre cancer (hyperplasia) or cancer because of the vaginal bleeding and thickened endometrial lining

## 2019-09-27 ENCOUNTER — Telehealth: Payer: Self-pay | Admitting: *Deleted

## 2019-09-27 DIAGNOSIS — R109 Unspecified abdominal pain: Secondary | ICD-10-CM

## 2019-09-27 DIAGNOSIS — R1900 Intra-abdominal and pelvic swelling, mass and lump, unspecified site: Secondary | ICD-10-CM

## 2019-09-27 NOTE — Telephone Encounter (Signed)
Patient scheduled on 10/09/19, no prior approval needed,patient is self pay.

## 2019-09-27 NOTE — Telephone Encounter (Signed)
Order placed at [facility] for CT scan, number given to patient to call and schedule.

## 2019-09-27 NOTE — Telephone Encounter (Signed)
-----   Message from Susan Majors, Susan Todd sent at 09/26/2019  1:42 PM EDT ----- Regarding: CT abdomen/pelvis with IV contrast Please help Susan Todd schedule a CT of the abdomen and pelvis with IV contrast (or what ever the radiologist feels is best) because she has some sort of abnormal mass in her pelvic cul-de-sac and I am not sure if it is coming from the ovary or her intestine She has thickening of the endometrium on ultrasound so I do want to get an endometrial biopsy eventually, but I would like to get the CT first

## 2019-09-28 LAB — URINE CULTURE
MICRO NUMBER:: 10791733
SPECIMEN QUALITY:: ADEQUATE

## 2019-09-30 MED ORDER — SULFAMETHOXAZOLE-TRIMETHOPRIM 800-160 MG PO TABS: 1.0000 | ORAL_TABLET | Freq: Two times a day (BID) | ORAL | 0 refills | Status: AC

## 2019-09-30 NOTE — Addendum Note (Signed)
Addended by: Theresia Majors D on: 09/30/2019 07:24 AM   Modules accepted: Orders

## 2019-10-02 ENCOUNTER — Ambulatory Visit (INDEPENDENT_AMBULATORY_CARE_PROVIDER_SITE_OTHER): Payer: Self-pay | Admitting: Obstetrics and Gynecology

## 2019-10-02 ENCOUNTER — Other Ambulatory Visit: Payer: Self-pay

## 2019-10-02 ENCOUNTER — Encounter: Payer: Self-pay | Admitting: Obstetrics and Gynecology

## 2019-10-02 VITALS — BP 122/78

## 2019-10-02 DIAGNOSIS — N898 Other specified noninflammatory disorders of vagina: Secondary | ICD-10-CM

## 2019-10-02 DIAGNOSIS — R9389 Abnormal findings on diagnostic imaging of other specified body structures: Secondary | ICD-10-CM

## 2019-10-02 DIAGNOSIS — B3731 Acute candidiasis of vulva and vagina: Secondary | ICD-10-CM

## 2019-10-02 DIAGNOSIS — R109 Unspecified abdominal pain: Secondary | ICD-10-CM

## 2019-10-02 DIAGNOSIS — B373 Candidiasis of vulva and vagina: Secondary | ICD-10-CM

## 2019-10-02 DIAGNOSIS — R1900 Intra-abdominal and pelvic swelling, mass and lump, unspecified site: Secondary | ICD-10-CM

## 2019-10-02 LAB — WET PREP FOR TRICH, YEAST, CLUE

## 2019-10-02 MED ORDER — LORAZEPAM 1 MG PO TABS: 1.0000 mg | ORAL_TABLET | Freq: Every day | ORAL | 0 refills | Status: AC

## 2019-10-02 MED ORDER — FLUCONAZOLE 150 MG PO TABS
150.0000 mg | ORAL_TABLET | ORAL | 0 refills | Status: AC
Start: 2019-10-02 — End: 2019-10-06

## 2019-10-02 NOTE — Progress Notes (Signed)
Susan Todd [birth year] [id number]  SUBJECTIVE:  54 y.o. [id number] female presents for an endometrial biopsy.  She had a marginally thickened endometrium of 4.4 mm on recent pelvic ultrasound with finding of an 8 cm mass in the posterior cul-de-sac.  She has had lower abdominal pain and a copious thick vaginal discharge of varying colors from brown, with red tinge, and now more with a yellow coloration.  She denies any rectal bleeding.  She is finding Tylenol ibuprofen do somewhat help her abdominal pain, but the tramadol prescribed her at the last visit did not help at all.  Does have anxiety that has been worsened throughout the course of all of this.  She does take lorazepam 1 mg sometimes 1 time per day as needed to help with anxiety.  She was told to come back for an endometrial biopsy and she has a CT of the abdomen and pelvis scheduled for this upcoming week.  Current Outpatient Medications  Medication Sig Dispense Refill  . acetaminophen (TYLENOL) 500 MG tablet Take 1,500 mg by mouth every 6 (six) hours as needed for mild pain or headache.    Marland Kitchen aspirin EC 81 MG tablet Take 81 mg by mouth daily.    Marland Kitchen atorvastatin (LIPITOR) 10 MG tablet Take 10 mg by mouth every evening.     . cetirizine (ZYRTEC) 5 MG tablet Take 5 mg by mouth at bedtime.    . gabapentin (NEURONTIN) 100 MG capsule Take 100 mg by mouth at bedtime.    Marland Kitchen ibuprofen (ADVIL) 800 MG tablet Take 1 tablet (800 mg total) by mouth every 8 (eight) hours as needed. 30 tablet 0  . LORazepam (ATIVAN) 1 MG tablet Take 1 tablet (1 mg total) by mouth at bedtime. 30 tablet 0  . losartan (COZAAR) 25 MG tablet Take 25 mg by mouth daily.    . metoprolol tartrate (LOPRESSOR) 25 MG tablet Take 25 mg by mouth 2 (two) times daily.    Marland Kitchen sulfamethoxazole-trimethoprim (BACTRIM DS) 800-160 MG tablet Take 1 tablet by mouth 2 (two) times daily for 5 days. 10 tablet 0  . traMADol (ULTRAM) 50 MG tablet Take 1 tablet (50 mg total) by mouth every 6 (six) hours as  needed for severe pain. (Patient not taking: Reported on 10/02/2019) 10 tablet 0   No current facility-administered medications for this visit.   Allergies: Codeine, Latex, and Metronidazole  Patient's last menstrual period was 11/20/2012.  Past medical history,surgical history, problem list, medications, allergies, family history and social history were all reviewed and documented as reviewed in the EPIC chart.  ROS:  Feeling well but anxious. No dyspnea or chest pain on exertion.  + Low abdominal pain, no change in bowel habits, black or bloody stools.  No urinary tract symptoms. GYN ROS: As described above in HPI    OBJECTIVE:  BP 122/78   LMP 11/20/2012  VAGINAL: Copious thick yellow mustard colored smooth discharge, no bleeding in vaginal vault, no feculent odor noted RECTAL: firm edge of mass palpable anteriorly in the posterior cul-de-sac, normal anal tone Vaginal wet prep: + Hyphae, no Trichomonas or clue cells, numerous WBC, moderate bacteria, 7-12 epithelial cells/hpf  Endometrial Biopsy Procedure Note Susan Todd May 20, 1965 [id number] The cervix was cleansed with a Betadine swab x3.  The anterior lip of the cervix was grasped with an Allis clamp.  The endometrial Pipelle sampler was easily introduced into the endometrial cavity and sound length measurement was taken (7 cm).  The plunger was withdrawn  causing suction in the Pipelle and the device was moved about the cavity for about 10 sec.  The Pipelle was removed then the sample was emptied in a specimen cup, maintaining sterility.  The Pipelle was reintroduced into the endometrial cavity to collect any remaining sample.  Scant tissue was obtained.  The specimen contents were collected and sent to pathology for analysis.  The patient tolerated the procedure well without any complication.  Kennon Portela present during the procedure and exam    ASSESSMENT:  54 y.o. [id number] with abdominal pain, pelvic mass, endometrial thickening,  vaginal candidiasis and copious discharge  PLAN:  1.  Vaginal candidiasis.  Will have staff contact patient to notify her that we are giving her a prescription for fluconazole 150 mg by mouth every 72 hours for 2 doses.  This would explain the vaginal discharge. 2.  Endometrial thickening.  Minimal tissue obtained on the biopsy today, I suspect a benign process particularly with the minimal/borderline thickening identified on the ultrasound.  This is reassuring. 2.  Abdominal pain and pelvic mass.  Given that the endometrial biopsy today returned with minimal tissue, I have low suspicion of a primary endometrial/uterine process.  Rectal exam indicates some sort of firm mass in the posterior cul-de-sac that feels external to the sigmoid, and that together with the finding on the recent ultrasound with the 8 cm mass in that area is concerning for a bowel lesion.  She has a CT of the abdomen pelvis coming up next week and I also again encouraged her to schedule a colonoscopy with GI.  Encouraged her to continue the Tylenol and ibuprofen for management of the abdominal pain. 4. Anxiety.  I did refill the lorazepam 1 mg #30, no refills.  She did briefly inquire about a different pain medication prescription, but uncomfortable with prescribing narcotics and benzodiazepines together and she is understanding of that.    We will only bill for the endometrial biopsy from today's encounter since much of the E&M content is a continuation from the previous encounter on 09/26/2019.   Theresia Majors MD 10/02/19

## 2019-10-04 LAB — PATHOLOGY REPORT

## 2019-10-04 LAB — TISSUE SPECIMEN

## 2019-10-07 ENCOUNTER — Ambulatory Visit: Payer: Self-pay | Admitting: Obstetrics and Gynecology

## 2019-10-07 ENCOUNTER — Telehealth: Payer: Self-pay | Admitting: *Deleted

## 2019-10-07 NOTE — Telephone Encounter (Signed)
Endometrial biopsy is benign, but pathologist comments that there was not a lot of tissue present for evaluation, but in the context I think it is much more important for her first to get the GI work-up (CT and colonoscopy), I do not highly suspect an abnormal process in the uterus (such as endometrial cancer) given the minimal thickening of the endometrial lining If any additional vaginal bleeding or blood-tinged discharge, may need to consider a D&C for further endometrial tissue sampling

## 2019-10-07 NOTE — Telephone Encounter (Signed)
Sent mychart message

## 2019-10-07 NOTE — Progress Notes (Signed)
Please see separate note to staff to relay to the patient.  Benign endometrial biopsy result.

## 2019-10-07 NOTE — Telephone Encounter (Signed)
Patient saw her biopsy results on my chart and would like to know what results mean. Please advise

## 2019-10-09 ENCOUNTER — Ambulatory Visit
Admission: RE | Admit: 2019-10-09 | Discharge: 2019-10-09 | Disposition: A | Discharge status: Home / Self Care | Payer: Self-pay | Source: Ambulatory Visit | Attending: Obstetrics and Gynecology | Admitting: Obstetrics and Gynecology

## 2019-10-09 ENCOUNTER — Telehealth: Payer: Self-pay | Admitting: *Deleted

## 2019-10-09 ENCOUNTER — Other Ambulatory Visit: Payer: Self-pay

## 2019-10-09 DIAGNOSIS — R109 Unspecified abdominal pain: Secondary | ICD-10-CM

## 2019-10-09 DIAGNOSIS — R1900 Intra-abdominal and pelvic swelling, mass and lump, unspecified site: Secondary | ICD-10-CM

## 2019-10-09 MED ORDER — IOPAMIDOL (ISOVUE-300) INJECTION 61%
100.0000 mL | Freq: Once | INTRAVENOUS | Status: AC | PRN
  Administered 2019-10-09: 100 mL via INTRAVENOUS

## 2019-10-09 NOTE — Telephone Encounter (Signed)
-----   Message from Theresia Majors, MD sent at 10/09/2019  3:18 PM EDT ----- We need to set up Susan Todd with an URGENT consult with a colorectal specialist (or at least general surgery) to address this pelvic abscess and diverticulitis. 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  I did discuss the results with the patient by phone just now. When calling patient please reiterate in meantime before she is seen by surgery, if any fever or severe unrelenting pain she needs to go to ED.

## 2019-10-09 NOTE — Telephone Encounter (Signed)
Message sent to referral coordinator at [facility] surgery to schedule.

## 2019-10-10 ENCOUNTER — Ambulatory Visit: Payer: Self-pay | Admitting: Obstetrics and Gynecology

## 2019-10-10 ENCOUNTER — Other Ambulatory Visit: Payer: Self-pay

## 2019-10-10 NOTE — Telephone Encounter (Signed)
Patient scheduled with Dr. Maisie Fus on 08/25 at 2:10 PM. Per referral coordinator Dr.Thomas would like patient to have colonoscopy as well, they will help schedule this for patient.

## 2019-10-17 ENCOUNTER — Other Ambulatory Visit (INDEPENDENT_AMBULATORY_CARE_PROVIDER_SITE_OTHER): Payer: Self-pay

## 2019-10-17 ENCOUNTER — Ambulatory Visit (INDEPENDENT_AMBULATORY_CARE_PROVIDER_SITE_OTHER): Payer: Self-pay | Admitting: Physician Assistant

## 2019-10-17 ENCOUNTER — Encounter: Payer: Self-pay | Admitting: *Deleted

## 2019-10-17 VITALS — BP 98/60 | HR 88 | Ht 68.0 in | Wt 173.1 lb

## 2019-10-17 DIAGNOSIS — R1030 Lower abdominal pain, unspecified: Secondary | ICD-10-CM

## 2019-10-17 DIAGNOSIS — R935 Abnormal findings on diagnostic imaging of other abdominal regions, including retroperitoneum: Secondary | ICD-10-CM

## 2019-10-17 LAB — CBC WITH DIFFERENTIAL/PLATELET
Basophils Absolute: 0.1 10*3/uL (ref 0.0–0.1)
Basophils Relative: 0.9 % (ref 0.0–3.0)
Eosinophils Absolute: 0.2 10*3/uL (ref 0.0–0.7)
Eosinophils Relative: 2.8 % (ref 0.0–5.0)
HCT: 30.4 % — ABNORMAL LOW (ref 36.0–46.0)
Hemoglobin: 10.3 g/dL — ABNORMAL LOW (ref 12.0–15.0)
Lymphocytes Relative: 30.7 % (ref 12.0–46.0)
Lymphs Abs: 2.7 10*3/uL (ref 0.7–4.0)
MCHC: 33.8 g/dL (ref 30.0–36.0)
MCV: 97.8 fl (ref 78.0–100.0)
Monocytes Absolute: 0.8 10*3/uL (ref 0.1–1.0)
Monocytes Relative: 8.7 % (ref 3.0–12.0)
Neutro Abs: 4.9 10*3/uL (ref 1.4–7.7)
Neutrophils Relative %: 56.9 % (ref 43.0–77.0)
Platelets: 623 10*3/uL — ABNORMAL HIGH (ref 150.0–400.0)
RBC: 3.11 Mil/uL — ABNORMAL LOW (ref 3.87–5.11)
RDW: 16.8 % — ABNORMAL HIGH (ref 11.5–15.5)
WBC: 8.6 10*3/uL (ref 4.0–10.5)

## 2019-10-17 LAB — COMPREHENSIVE METABOLIC PANEL
ALT: 5 U/L (ref 0–35)
AST: 11 U/L (ref 0–37)
Albumin: 3.6 g/dL (ref 3.5–5.2)
Alkaline Phosphatase: 103 U/L (ref 39–117)
BUN: 15 mg/dL (ref 6–23)
CO2: 26 mEq/L (ref 19–32)
Calcium: 9.8 mg/dL (ref 8.4–10.5)
Chloride: 100 mEq/L (ref 96–112)
Creatinine, Ser: 1.03 mg/dL (ref 0.40–1.20)
GFR: 55.3 mL/min — ABNORMAL LOW (ref >60.00)
Glucose, Bld: 87 mg/dL (ref 70–99)
Potassium: 4.1 mEq/L (ref 3.5–5.1)
Sodium: 135 mEq/L (ref 135–145)
Total Bilirubin: 0.5 mg/dL (ref 0.2–1.2)
Total Protein: 7.6 g/dL (ref 6.0–8.3)

## 2019-10-17 MED ORDER — AMOXICILLIN-POT CLAVULANATE 875-125 MG PO TABS
1.0000 | ORAL_TABLET | Freq: Two times a day (BID) | ORAL | 0 refills | Status: DC
Start: 2019-10-17 — End: 2019-11-27

## 2019-10-17 MED ORDER — HYDROCODONE-ACETAMINOPHEN 5-325 MG PO TABS: 1.0000 | ORAL_TABLET | Freq: Four times a day (QID) | ORAL | 0 refills | Status: DC | PRN

## 2019-10-17 NOTE — Progress Notes (Signed)
Agree with assessment as outlined. Complicated CT scan - fistulas noted with significant inflammation, unclear if this is due to diverticulitis, question of perforated appendicitis on CT. Seen by Dr. Maisie Fus who think she palpated a mass in the rectum, patient declined DRE today due to pain. She had been on antibiotics but has been off recently, did not tolerate cipro and also felt poorly on flagyl. Given fistulas noted I do think she needs to be back on antibiotics, she has declined cipro / flagyl, will give her a trial of Augmentin. Will repeat CBC today. If severe leukocytosis she will need a repeat CT scan urgently given her pain. She does need a flex sig / colonoscopy when she is stable for this, question is if we need to repeat some imaging beforehand. Will await labs with further recommendations. Victorino Dike can you let me know what these show and can discuss plan for her further, see if antibiotics help in the interim.

## 2019-10-17 NOTE — Progress Notes (Signed)
Chief Complaint: Abnormal abdominal imaging  HPI:    Susan Todd is a 54 year old female, who previously followed with Dr. Laural Benes, with a past medical history as listed below including A. fib and COPD who was referred to me by Georgann Housekeeper, MD for a complaint of normal abdominal imaging.      09/19/2019 CBC with a white count of 15.2.  Hemoglobin 10.2.    10/02/2019 patient saw gynecologist for an endometrial biopsy.  Rectal exam at that time indicated some sort of firm mass in the posterior cul-de-sac that felt external to the sigmoid.    10/09/2019 patient had a CT of the abdomen pelvis with contrast which shows areas of enhancement in the cul-de-sac extending towards the upper vagina which may represent part of the fistulous network that explains vaginal discharge.  Overall this shows features of a complex fistulous network associated with a perforated appendicitis or sequela of diverticulitis.  The imaging appearance would suggest this is not acute, more likely subacute with signs of severe pelvic inflammation.  There is thickening, mural stratification and abnormal enhancement of the appendix, appendiceal tip descends into the pelvis where it is loss within a gas and fluid containing collection.  A tract extending from this collection extends to the right lateral wall of the sigmoid colon, marked sigmoid thickening as well and there is evidence of diverticular disease and clips generalized colonic thickening.  A mural sinus tract or fistula from the colon is evident as well as which potentially explains the gas within the collection.  A 2.9 x 2.4 cm fluid-containing structure in the right adnexa.  Along the right lateral aspect of the sigmoid just above the rectum a 2.0 x 1.2 cm fluid collection is also present.  There are also multiple small areas of abnormal enhancement in the fascial thickening likely reactive inflammation but due to severity of the above findings would suggest follow-up colonoscopy  when the patient is able or comparison with recent colonoscopy results to exclude the possibility of concurrent neoplasm.    10/10/2019 phone note discusses that Dr. Maisie Fus would like patient to have a colonoscopy as well as surgical referral.  Also received referral from her office from office visit yesterday.  At that time was noted intrinsic mass was palpated in the rectum approximately 8 cm from the anal verge.  Patient was instructed to follow with [place] for colonoscopy before anything else was to be done.    Today, the patient tells me about 5 months ago she started with some vaginal discharge which just got worse and worse.  She was initially seen at [facility] and started on ciprofloxacin for 2 days for a suspected UTI, she was called about 2 days later after being terribly ill on the ciprofloxacin and told that she did not have a UTI.  She continued with discharge and saw her gynecologist to put her on metronidazole twice daily for a week early in July.  This did not help anything.  She was then given a gel/vaginal for metronidazole which still did not help anything.  She had multiple exams from her gynecologist as above and was seen by the [facility] yesterday.  Tells me that she has severe pain in her lower abdomen throughout the day, this sometimes wakes her up from her sleep rated as a 9-10/10 at its worst.  It is typically just in her suprapubic region but can radiate to both sides of her abdomen.  Tells me she is also seeing  some fecal matter from her vagina occasionally.  She is also having bowel movements which are soft and solid but seems smaller than normal and can be up to 4 times a day.  Denies any fever, nausea or vomiting.  She continues to eat well.  Currently using ibuprofen or Tylenol when she feels the pain, on.  Tells me she tried tramadol which did not help at all.    Denies previous colonoscopy, fever, chills, weight loss, blood in her stool, change in bowel habits,  nausea, vomiting, heartburn, reflux or symptoms that awaken her from sleep.  Past Medical History:  Diagnosis Date  . Anxiety   . Arthritis   . Atrial fibrillation (HCC)    "pt. has no knowledge of this" as of 03-07-14  . Carotid artery occlusion   . COPD (chronic obstructive pulmonary disease) (HCC)    Emphysema  . Depression   . Diverticulosis   . Hypercholesterolemia   . Hypertension   . Seizure disorder (HCC)   . Vaginal candidiasis     Past Surgical History:  Procedure Laterality Date  . BUNIONECTOMY Bilateral   . CYST EXCISION PERINEAL     8'03- Dr. Elana AlmMcPhail    Current Outpatient Medications  Medication Sig Dispense Refill  . acetaminophen (TYLENOL) 500 MG tablet Take 1,500 mg by mouth every 6 (six) hours as needed for mild pain or headache.    . albuterol (VENTOLIN HFA) 108 (90 Base) MCG/ACT inhaler SMARTSIG:1 Puff(s) Via Inhaler Every 6 Hours PRN    . aspirin EC 81 MG tablet Take 81 mg by mouth daily.    Marland Kitchen. atorvastatin (LIPITOR) 10 MG tablet Take 10 mg by mouth every evening.     . calcium-vitamin D (OSCAL WITH D) 500-200 MG-UNIT TABS tablet Take 1 tablet by mouth at bedtime.    . cetirizine (ZYRTEC) 5 MG tablet Take 5 mg by mouth at bedtime.    . gabapentin (NEURONTIN) 100 MG capsule Take 100 mg by mouth at bedtime.    Marland Kitchen. ibuprofen (ADVIL) 800 MG tablet Take 1 tablet (800 mg total) by mouth every 8 (eight) hours as needed. 30 tablet 0  . LORazepam (ATIVAN) 1 MG tablet Take 1 tablet (1 mg total) by mouth at bedtime. 30 tablet 0  . losartan (COZAAR) 25 MG tablet Take 25 mg by mouth daily.    . metoprolol tartrate (LOPRESSOR) 25 MG tablet Take 25 mg by mouth 2 (two) times daily.    . Multiple Vitamin (MULTI-VITAMIN) tablet Take 1 tablet by mouth daily.     No current facility-administered medications for this visit.    Allergies as of 10/17/2019 - Review Complete 10/17/2019  Allergen Reaction Noted  . Codeine Nausea And Vomiting   . Latex Hives   . Metronidazole  Diarrhea 09/05/2019    Family History  Problem Relation Age of Onset  . Heart disease Mother        NOT  before age 56  . Hyperlipidemia Mother   . Dementia Mother   . Hypertension Mother   . Depression Mother   . Cancer Father        MELANOMA  . Hyperlipidemia Father   . Hypertension Father   . Cancer Brother        MELANOMA  . Hyperlipidemia Brother   . Hypertension Brother   . Heart disease Brother        Arhymia  . Alcohol abuse Brother   . Depression Brother   . Heart disease Maternal Aunt  Social History   Socioeconomic History  . Marital status: Single    Spouse name: Not on file  . Number of children: Not on file  . Years of education: Not on file  . Highest education level: Not on file  Occupational History  . Not on file  Tobacco Use  . Smoking status: Current Every Day Smoker    Packs/day: 1.00    Years: 30.00    Pack years: 30.00    Types: Cigarettes  . Smokeless tobacco: Never Used  . Tobacco comment: pt states that she knows she needs to quit but has been stressed and uses cigs as a resort  Vaping Use  . Vaping Use: Never used  Substance and Sexual Activity  . Alcohol use: Not Currently    Comment: hx heavy alcohol use  . 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 Sexual activity: Not Currently    Comment: 1st intercourse 54 yo-More than 5 partners  Other Topics Concern  . Not on file  Social History Narrative  . Not on file   Social Determinants of Health   Financial Resource Strain:   . Difficulty of Paying Living Expenses: Not on file  Food Insecurity:   . Worried About Programme researcher, broadcasting/film/video in the Last Year: Not on file  . Ran Out of Food in the Last Year: Not on file  Transportation Needs:   . Lack of Transportation (Medical): Not on file  . Lack of Transportation (Non-Medical): Not on file  Physical Activity:   . Days of Exercise per Week: Not on file  . Minutes of Exercise per Session: Not on file  Stress:     . Feeling of Stress : Not on file  Social Connections:   . Frequency of Communication with Friends and Family: Not on file  . Frequency of Social Gatherings with Friends and Family: Not on file  . Attends Religious Services: Not on file  . Active Member of Clubs or Organizations: Not on file  . Attends Banker Meetings: Not on file  . Marital Status: Not on file  Intimate Partner Violence:   . Fear of Current or Ex-Partner: Not on file  . Emotionally Abused: Not on file  . Physically Abused: Not on file  . Sexually Abused: Not on file    Review of Systems:    Constitutional: No weight loss, fever or chills Skin: No rash  Cardiovascular: No chest pain Respiratory: No SOB  Gastrointestinal: See HPI and otherwise negative Genitourinary: No dysuria  Neurological: No headache, dizziness or syncope Musculoskeletal: No new muscle or joint pain Hematologic: No bleeding Psychiatric: No history of depression or anxiety   Physical Exam:  Vital signs: Ht 5\' 8"  (1.727 m) Comment: height measured without shoes  Wt 173 lb 2 oz (78.5 kg)   LMP 11/20/2012   BMI 26.32 kg/m   Constitutional:   Pleasant Caucasian female appears to be in NAD, Well developed, Well nourished, alert and cooperative Head:  Normocephalic and atraumatic. Eyes:   PEERL, EOMI. No icterus. Conjunctiva pink. Ears:  Normal auditory acuity. Neck:  Supple Throat: Oral cavity and pharynx without inflammation, swelling or lesion.  Respiratory: Respirations even and unlabored. Lungs clear to auscultation bilaterally.   No wheezes, crackles, or rhonchi.  Cardiovascular: Normal S1, S2. No MRG. Regular rate and rhythm. No peripheral edema, cyanosis or pallor.  Gastrointestinal:  Soft, nondistended, marked suprapubic ttp with involuntary guarding, Normal bowel  sounds. No appreciable masses or hepatomegaly. Rectal:  Patient declined  Msk:  Symmetrical without gross deformities. Without edema, no deformity or joint  abnormality.  Neurologic:  Alert and  oriented x4;  grossly normal neurologically.  Skin:   Dry and intact without significant lesions or rashes. Psychiatric:  Demonstrates good judgement and reason without abnormal affect or behaviors.  RELEVANT LABS AND IMAGING: CBC    Component Value Date/Time   WBC 15.2 (H) 09/19/2019 1459   RBC 3.02 (L) 09/19/2019 1459   HGB 10.2 (L) 09/19/2019 1459   HCT 31.3 (L) 09/19/2019 1459   PLT 386 09/19/2019 1459   MCV 103.6 (H) 09/19/2019 1459   MCH 33.8 09/19/2019 1459   MCHC 32.6 09/19/2019 1459   RDW 14.5 09/19/2019 1459   LYMPHSABS 1.6 08/10/2018 1845   MONOABS 0.9 08/10/2018 1845   EOSABS 0.0 08/10/2018 1845   BASOSABS 0.0 08/10/2018 1845    CMP     Component Value Date/Time   NA 134 (L) 09/19/2019 1459   K 3.6 09/19/2019 1459   CL 98 09/19/2019 1459   CO2 20 (L) 09/19/2019 1459   GLUCOSE 101 (H) 09/19/2019 1459   BUN 13 09/19/2019 1459   CREATININE 0.94 09/19/2019 1459   CREATININE 0.68 10/14/2010 1059   CALCIUM 8.6 (L) 09/19/2019 1459   PROT 6.8 09/19/2019 1459   ALBUMIN 2.7 (L) 09/19/2019 1459   AST 13 (L) 09/19/2019 1459   ALT 7 09/19/2019 1459   ALKPHOS 110 09/19/2019 1459   BILITOT 0.3 09/19/2019 1459   GFRNONAA >60 09/19/2019 1459   GFRAA >60 09/19/2019 1459    Assessment: 1.  Abnormal CT of the abdomen: See HPI, abscess?  Fistulous tracts, question of mass?,  No prior colonoscopy, rectal mass on exam per surgeons and gynecologist, patient would not let me repeat today 2.  Lower abdominal pain: With above 3.  Abscess/fistula  Plan: 1.  Prescribed Hydrocodone 5-325 1 tab every 6 hours as needed for severe pain #15 with no refills. 2.   Repeat CBC and CMP today.  Discussed case with Dr. Adela Lank.  If white count is still severely elevated then we will likely recommend she proceed to the hospital for IV antibiotics and repeat imaging.  If white count is stable then will consider outpatient flex sigmoidoscopy within the  next week and possible repeat imaging with MRI of the pelvis. 3.  Started the patient on Augmentin twice daily x2 weeks #14 4.  Recommend the patient discontinue Ibuprofen use.  She can continue Tylenol. 5.  Patient to follow in clinic per recommendations after labs today.  Hyacinth Meeker, PA-C [facility] 10/17/2019, 1:20 PM  Cc: Georgann Housekeeper, MD

## 2019-10-17 NOTE — Patient Instructions (Addendum)
Your provider has requested that you go to the basement level for lab work before leaving today. Press "B" on the elevator. The lab is located at the first door on the left as you exit the elevator.   We have sent the following medications to your pharmacy for you to pick up at your convenience: Augmentin and oxycodone.   Stop taking ibuprofen.

## 2019-10-18 ENCOUNTER — Telehealth: Payer: Self-pay | Admitting: Physician Assistant

## 2019-10-18 NOTE — Telephone Encounter (Signed)
Pt states she took one hydrocodone last night before bed and it lasted about an hour. She has taken one this morning and it only lasted an hour. She wants to know what else she can do for pain. Please advise.

## 2019-10-21 ENCOUNTER — Other Ambulatory Visit: Payer: Self-pay

## 2019-10-21 ENCOUNTER — Other Ambulatory Visit: Payer: Self-pay | Admitting: Gastroenterology

## 2019-10-21 ENCOUNTER — Encounter: Payer: Self-pay | Admitting: Gastroenterology

## 2019-10-21 DIAGNOSIS — R109 Unspecified abdominal pain: Secondary | ICD-10-CM

## 2019-10-21 DIAGNOSIS — R935 Abnormal findings on diagnostic imaging of other abdominal regions, including retroperitoneum: Secondary | ICD-10-CM

## 2019-10-21 MED ORDER — HYDROCODONE-ACETAMINOPHEN 5-325 MG PO TABS: 1.0000 | ORAL_TABLET | Freq: Four times a day (QID) | ORAL | 0 refills | Status: DC | PRN

## 2019-10-21 NOTE — Telephone Encounter (Signed)
Pt is requesting a call back from a nurse. 

## 2019-10-21 NOTE — Telephone Encounter (Signed)
She can take 2 at a time if she would like to try that.  Thanks-JLL

## 2019-10-21 NOTE — Telephone Encounter (Signed)
Left message on machine to call back  

## 2019-10-21 NOTE — Telephone Encounter (Signed)
The patient has been notified of this information and all questions answered.

## 2019-10-23 ENCOUNTER — Ambulatory Visit (INDEPENDENT_AMBULATORY_CARE_PROVIDER_SITE_OTHER)
Admission: RE | Admit: 2019-10-23 | Discharge: 2019-10-23 | Disposition: A | Discharge status: Home / Self Care | Payer: Self-pay | Source: Ambulatory Visit | Attending: Gastroenterology | Admitting: Gastroenterology

## 2019-10-23 ENCOUNTER — Telehealth: Payer: Self-pay

## 2019-10-23 ENCOUNTER — Telehealth: Payer: Self-pay | Admitting: Gastroenterology

## 2019-10-23 ENCOUNTER — Other Ambulatory Visit: Payer: Self-pay

## 2019-10-23 DIAGNOSIS — R935 Abnormal findings on diagnostic imaging of other abdominal regions, including retroperitoneum: Secondary | ICD-10-CM

## 2019-10-23 MED ORDER — IOHEXOL 300 MG/ML  SOLN
100.0000 mL | Freq: Once | INTRAMUSCULAR | Status: AC | PRN
  Administered 2019-10-23: 100 mL via INTRAVENOUS

## 2019-10-23 NOTE — Telephone Encounter (Signed)
Called patient with results, see other telephone note for details

## 2019-10-23 NOTE — Telephone Encounter (Signed)
Dr Adela Lank is not available. Karl Bales, RN has given him a message to call me.

## 2019-10-23 NOTE — Telephone Encounter (Signed)
Call from [facility] to bring attention to the CT imaging done today on this patient. CT results are available in Epic now.

## 2019-10-23 NOTE — Telephone Encounter (Signed)
I called the patient with the CT scan results.  She has intermittent excruciating pain in her pelvis and lower abdomen.  She is having drainage of pus and stool from her vagina.  CT scan shows complex fistulous network in her pelvis.  I discussed the findings with Dr. Nadene Rubins of radiology.  There is a persistent small abscess in the pelvis, there is another enlarging suspected right ovarian abscess.  She has significant inflammatory changes in her appendix, it is unclear if this is arising from diverticulitis, or prior contained appendiceal perforation per radiology.  Crohn's also on the differential but seems less likely.  Dr. Maisie Fus also previously palpated a rectal mass, no obvious mass noted per radiology although the patient does warrant endoscopic evaluation to rule that out.  Discussed options with the patient.  She has been on oral antibiotics and unfortunately has worsening imaging findings.  She continues to have severe pain and cannot function.  At this point given her worsening imaging I think that hospitalization is indicated.  She warrants an IR consult to see if either of these pelvic abscesses are amenable to drainage.  I do think she would benefit from IV antibiotics, as well as another surgical consult regarding these findings.  I would be hesitant to perform endoscopic evaluation of this with a suspected diverticulitis or contained perforation, needs multidisciplinary care at this point.  I discussed with the patient at length, she understands, she will go to the hospital for repeat labs in the ED and likely admission.  I will notify our inpatient GI service.

## 2019-10-24 ENCOUNTER — Encounter (HOSPITAL_COMMUNITY): Payer: Self-pay

## 2019-10-24 ENCOUNTER — Inpatient Hospital Stay (HOSPITAL_COMMUNITY)
Admission: EM | Admit: 2019-10-24 | Discharge: 2019-11-27 | DRG: 853 | Disposition: A | Discharge status: Home Health | Payer: Self-pay | Attending: Internal Medicine | Admitting: Internal Medicine

## 2019-10-24 DIAGNOSIS — Z95828 Presence of other vascular implants and grafts: Secondary | ICD-10-CM

## 2019-10-24 DIAGNOSIS — J439 Emphysema, unspecified: Secondary | ICD-10-CM | POA: Diagnosis present

## 2019-10-24 DIAGNOSIS — N739 Female pelvic inflammatory disease, unspecified: Secondary | ICD-10-CM | POA: Diagnosis present

## 2019-10-24 DIAGNOSIS — M1990 Unspecified osteoarthritis, unspecified site: Secondary | ICD-10-CM | POA: Diagnosis present

## 2019-10-24 DIAGNOSIS — D638 Anemia in other chronic diseases classified elsewhere: Secondary | ICD-10-CM | POA: Diagnosis present

## 2019-10-24 DIAGNOSIS — Z0189 Encounter for other specified special examinations: Secondary | ICD-10-CM

## 2019-10-24 DIAGNOSIS — R569 Unspecified convulsions: Secondary | ICD-10-CM

## 2019-10-24 DIAGNOSIS — Z825 Family history of asthma and other chronic lower respiratory diseases: Secondary | ICD-10-CM

## 2019-10-24 DIAGNOSIS — F17210 Nicotine dependence, cigarettes, uncomplicated: Secondary | ICD-10-CM | POA: Diagnosis present

## 2019-10-24 DIAGNOSIS — E8809 Other disorders of plasma-protein metabolism, not elsewhere classified: Secondary | ICD-10-CM | POA: Diagnosis present

## 2019-10-24 DIAGNOSIS — D539 Nutritional anemia, unspecified: Secondary | ICD-10-CM | POA: Diagnosis present

## 2019-10-24 DIAGNOSIS — K56609 Unspecified intestinal obstruction, unspecified as to partial versus complete obstruction: Secondary | ICD-10-CM | POA: Diagnosis not present

## 2019-10-24 DIAGNOSIS — Z8249 Family history of ischemic heart disease and other diseases of the circulatory system: Secondary | ICD-10-CM

## 2019-10-24 DIAGNOSIS — Z83438 Family history of other disorder of lipoprotein metabolism and other lipidemia: Secondary | ICD-10-CM

## 2019-10-24 DIAGNOSIS — Z7982 Long term (current) use of aspirin: Secondary | ICD-10-CM

## 2019-10-24 DIAGNOSIS — F32A Depression, unspecified: Secondary | ICD-10-CM | POA: Diagnosis present

## 2019-10-24 DIAGNOSIS — K651 Peritoneal abscess: Secondary | ICD-10-CM

## 2019-10-24 DIAGNOSIS — F419 Anxiety disorder, unspecified: Secondary | ICD-10-CM | POA: Diagnosis present

## 2019-10-24 DIAGNOSIS — F329 Major depressive disorder, single episode, unspecified: Secondary | ICD-10-CM | POA: Diagnosis present

## 2019-10-24 DIAGNOSIS — E7800 Pure hypercholesterolemia, unspecified: Secondary | ICD-10-CM | POA: Diagnosis present

## 2019-10-24 DIAGNOSIS — Z91040 Latex allergy status: Secondary | ICD-10-CM

## 2019-10-24 DIAGNOSIS — Z4659 Encounter for fitting and adjustment of other gastrointestinal appliance and device: Secondary | ICD-10-CM

## 2019-10-24 DIAGNOSIS — G40909 Epilepsy, unspecified, not intractable, without status epilepticus: Secondary | ICD-10-CM | POA: Diagnosis present

## 2019-10-24 DIAGNOSIS — K5720 Diverticulitis of large intestine with perforation and abscess without bleeding: Secondary | ICD-10-CM | POA: Diagnosis present

## 2019-10-24 DIAGNOSIS — Z818 Family history of other mental and behavioral disorders: Secondary | ICD-10-CM

## 2019-10-24 DIAGNOSIS — N823 Fistula of vagina to large intestine: Secondary | ICD-10-CM | POA: Diagnosis present

## 2019-10-24 DIAGNOSIS — E876 Hypokalemia: Secondary | ICD-10-CM | POA: Diagnosis present

## 2019-10-24 DIAGNOSIS — K219 Gastro-esophageal reflux disease without esophagitis: Secondary | ICD-10-CM | POA: Diagnosis present

## 2019-10-24 DIAGNOSIS — J449 Chronic obstructive pulmonary disease, unspecified: Secondary | ICD-10-CM | POA: Diagnosis present

## 2019-10-24 DIAGNOSIS — Z20822 Contact with and (suspected) exposure to covid-19: Secondary | ICD-10-CM | POA: Diagnosis present

## 2019-10-24 DIAGNOSIS — K37 Unspecified appendicitis: Secondary | ICD-10-CM | POA: Diagnosis present

## 2019-10-24 DIAGNOSIS — Z885 Allergy status to narcotic agent status: Secondary | ICD-10-CM

## 2019-10-24 DIAGNOSIS — E785 Hyperlipidemia, unspecified: Secondary | ICD-10-CM | POA: Diagnosis present

## 2019-10-24 DIAGNOSIS — I4891 Unspecified atrial fibrillation: Secondary | ICD-10-CM | POA: Diagnosis present

## 2019-10-24 DIAGNOSIS — D62 Acute posthemorrhagic anemia: Secondary | ICD-10-CM | POA: Diagnosis not present

## 2019-10-24 DIAGNOSIS — I10 Essential (primary) hypertension: Secondary | ICD-10-CM | POA: Diagnosis present

## 2019-10-24 DIAGNOSIS — K641 Second degree hemorrhoids: Secondary | ICD-10-CM | POA: Diagnosis present

## 2019-10-24 DIAGNOSIS — A419 Sepsis, unspecified organism: Principal | ICD-10-CM | POA: Diagnosis present

## 2019-10-24 DIAGNOSIS — Z888 Allergy status to other drugs, medicaments and biological substances status: Secondary | ICD-10-CM

## 2019-10-24 DIAGNOSIS — Z79899 Other long term (current) drug therapy: Secondary | ICD-10-CM

## 2019-10-24 DIAGNOSIS — K567 Ileus, unspecified: Secondary | ICD-10-CM | POA: Diagnosis not present

## 2019-10-24 LAB — CBC
HCT: 32.5 % — ABNORMAL LOW (ref 36.0–46.0)
HCT: 30.3 % — ABNORMAL LOW (ref 36.0–46.0)
Hemoglobin: 10.2 g/dL — ABNORMAL LOW (ref 12.0–15.0)
Hemoglobin: 9.5 g/dL — ABNORMAL LOW (ref 12.0–15.0)
MCH: 32.2 pg (ref 26.0–34.0)
MCH: 32.3 pg (ref 26.0–34.0)
MCHC: 31.4 g/dL (ref 30.0–36.0)
MCHC: 31.4 g/dL (ref 30.0–36.0)
MCV: 102.5 fL — ABNORMAL HIGH (ref 80.0–100.0)
MCV: 103.1 fL — ABNORMAL HIGH (ref 80.0–100.0)
Platelets: 464 10*3/uL — ABNORMAL HIGH (ref 150–400)
Platelets: 480 10*3/uL — ABNORMAL HIGH (ref 150–400)
RBC: 3.17 MIL/uL — ABNORMAL LOW (ref 3.87–5.11)
RBC: 2.94 MIL/uL — ABNORMAL LOW (ref 3.87–5.11)
RDW: 16 % — ABNORMAL HIGH (ref 11.5–15.5)
RDW: 15.9 % — ABNORMAL HIGH (ref 11.5–15.5)
WBC: 12.6 10*3/uL — ABNORMAL HIGH (ref 4.0–10.5)
WBC: 11.4 10*3/uL — ABNORMAL HIGH (ref 4.0–10.5)
nRBC: 0 % (ref 0.0–0.2)
nRBC: 0 % (ref 0.0–0.2)

## 2019-10-24 LAB — COMPREHENSIVE METABOLIC PANEL
ALT: 9 U/L (ref 0–44)
AST: 12 U/L — ABNORMAL LOW (ref 15–41)
Albumin: 3.4 g/dL — ABNORMAL LOW (ref 3.5–5.0)
Alkaline Phosphatase: 92 U/L (ref 38–126)
Anion gap: 9 (ref 5–15)
BUN: 8 mg/dL (ref 6–20)
CO2: 25 mmol/L (ref 22–32)
Calcium: 9.1 mg/dL (ref 8.9–10.3)
Chloride: 102 mmol/L (ref 98–111)
Creatinine, Ser: 0.68 mg/dL (ref 0.44–1.00)
GFR calc Af Amer: >60 mL/min (ref >60)
GFR calc non Af Amer: >60 mL/min (ref >60)
Glucose, Bld: 107 mg/dL — ABNORMAL HIGH (ref 70–99)
Potassium: 3.7 mmol/L (ref 3.5–5.1)
Sodium: 136 mmol/L (ref 135–145)
Total Bilirubin: 0.6 mg/dL (ref 0.3–1.2)
Total Protein: 7.3 g/dL (ref 6.5–8.1)

## 2019-10-24 LAB — URINALYSIS, ROUTINE W REFLEX MICROSCOPIC
Bilirubin Urine: NEGATIVE
Glucose, UA: NEGATIVE mg/dL
Ketones, ur: 5 mg/dL — AB
Nitrite: NEGATIVE
Protein, ur: 100 mg/dL — AB
Specific Gravity, Urine: 1.013 (ref 1.005–1.030)
WBC, UA: >50 WBC/hpf — ABNORMAL HIGH (ref 0–5)
pH: 5 (ref 5.0–8.0)

## 2019-10-24 LAB — LACTIC ACID, PLASMA
Lactic Acid, Venous: 1.7 mmol/L (ref 0.5–1.9)
Lactic Acid, Venous: 1.1 mmol/L (ref 0.5–1.9)

## 2019-10-24 LAB — CREATININE, SERUM
Creatinine, Ser: 0.86 mg/dL (ref 0.44–1.00)
GFR calc Af Amer: >60 mL/min (ref >60)
GFR calc non Af Amer: >60 mL/min (ref >60)

## 2019-10-24 LAB — SARS CORONAVIRUS 2 BY RT PCR (HOSPITAL ORDER, PERFORMED IN ~~LOC~~ HOSPITAL LAB): SARS Coronavirus 2: NEGATIVE

## 2019-10-24 MED ORDER — LORAZEPAM 1 MG PO TABS
1.0000 mg | ORAL_TABLET | Freq: Every day | ORAL | Status: DC | PRN
  Administered 2019-10-24 – 2019-11-07 (×13): 1 mg via ORAL
  Filled 2019-10-24 (×14): qty 1

## 2019-10-24 MED ORDER — LACTATED RINGERS IV SOLN: INTRAVENOUS | Status: DC

## 2019-10-24 MED ORDER — VANCOMYCIN HCL 1500 MG/300ML IV SOLN
1500.0000 mg | Freq: Once | INTRAVENOUS | Status: DC
  Filled 2019-10-24: qty 300

## 2019-10-24 MED ORDER — ATORVASTATIN CALCIUM 10 MG PO TABS
10.0000 mg | ORAL_TABLET | Freq: Every evening | ORAL | Status: DC
  Administered 2019-10-26 – 2019-11-12 (×18): 10 mg via ORAL
  Filled 2019-10-24 (×18): qty 1

## 2019-10-24 MED ORDER — PIPERACILLIN-TAZOBACTAM 3.375 G IVPB
3.3750 g | Freq: Three times a day (TID) | INTRAVENOUS | Status: DC
  Administered 2019-10-25 – 2019-11-13 (×56): 3.375 g via INTRAVENOUS
  Filled 2019-10-24 (×61): qty 50

## 2019-10-24 MED ORDER — NICOTINE 7 MG/24HR TD PT24
7.0000 mg | MEDICATED_PATCH | Freq: Every day | TRANSDERMAL | Status: DC
  Filled 2019-10-24 (×17): qty 1

## 2019-10-24 MED ORDER — ALBUTEROL SULFATE HFA 108 (90 BASE) MCG/ACT IN AERS: 2.0000 | INHALATION_SPRAY | Freq: Four times a day (QID) | RESPIRATORY_TRACT | Status: DC | PRN

## 2019-10-24 MED ORDER — ADULT MULTIVITAMIN W/MINERALS CH
1.0000 | ORAL_TABLET | Freq: Every day | ORAL | Status: DC
  Administered 2019-10-24 – 2019-11-12 (×20): 1 via ORAL
  Filled 2019-10-24 (×20): qty 1

## 2019-10-24 MED ORDER — ENOXAPARIN SODIUM 40 MG/0.4ML ~~LOC~~ SOLN
40.0000 mg | SUBCUTANEOUS | Status: DC
  Administered 2019-10-24 – 2019-11-20 (×27): 40 mg via SUBCUTANEOUS
  Filled 2019-10-24 (×28): qty 0.4

## 2019-10-24 MED ORDER — CALCIUM CARBONATE-VITAMIN D 500-200 MG-UNIT PO TABS
1.0000 | ORAL_TABLET | Freq: Every day | ORAL | Status: DC
  Administered 2019-10-24 – 2019-11-12 (×20): 1 via ORAL
  Filled 2019-10-24 (×21): qty 1

## 2019-10-24 MED ORDER — MORPHINE SULFATE (PF) 2 MG/ML IV SOLN
2.0000 mg | INTRAVENOUS | Status: DC | PRN
  Administered 2019-10-24 – 2019-10-25 (×2): 2 mg via INTRAVENOUS
  Filled 2019-10-24 (×2): qty 1

## 2019-10-24 MED ORDER — ACETAMINOPHEN 325 MG PO TABS: 650.0000 mg | ORAL_TABLET | Freq: Four times a day (QID) | ORAL | Status: DC | PRN

## 2019-10-24 MED ORDER — PIPERACILLIN-TAZOBACTAM 3.375 G IVPB 30 MIN
3.3750 g | Freq: Once | INTRAVENOUS | Status: AC
  Administered 2019-10-24: 3.375 g via INTRAVENOUS
  Filled 2019-10-24: qty 50

## 2019-10-24 MED ORDER — ASPIRIN EC 81 MG PO TBEC
81.0000 mg | DELAYED_RELEASE_TABLET | Freq: Every day | ORAL | Status: DC
  Administered 2019-10-25 – 2019-11-13 (×18): 81 mg via ORAL
  Filled 2019-10-24 (×18): qty 1

## 2019-10-24 MED ORDER — ALBUTEROL SULFATE (2.5 MG/3ML) 0.083% IN NEBU: 2.5000 mg | INHALATION_SOLUTION | Freq: Four times a day (QID) | RESPIRATORY_TRACT | Status: DC | PRN

## 2019-10-24 MED ORDER — ACETAMINOPHEN 650 MG RE SUPP: 650.0000 mg | Freq: Four times a day (QID) | RECTAL | Status: DC | PRN

## 2019-10-24 MED ORDER — FENTANYL CITRATE (PF) 100 MCG/2ML IJ SOLN
50.0000 ug | Freq: Once | INTRAMUSCULAR | Status: AC
  Administered 2019-10-24: 50 ug via INTRAVENOUS
  Filled 2019-10-24: qty 2

## 2019-10-24 MED ORDER — SENNOSIDES-DOCUSATE SODIUM 8.6-50 MG PO TABS: 1.0000 | ORAL_TABLET | Freq: Every evening | ORAL | Status: DC | PRN

## 2019-10-24 MED ORDER — ONDANSETRON HCL 4 MG/2ML IJ SOLN
4.0000 mg | Freq: Four times a day (QID) | INTRAMUSCULAR | Status: DC | PRN
  Administered 2019-11-10 – 2019-11-13 (×9): 4 mg via INTRAVENOUS
  Filled 2019-10-24 (×9): qty 2

## 2019-10-24 MED ORDER — LACTATED RINGERS IV BOLUS (SEPSIS): 1000.0000 mL | Freq: Once | INTRAVENOUS | Status: DC

## 2019-10-24 MED ORDER — VANCOMYCIN HCL 1500 MG/300ML IV SOLN
1500.0000 mg | Freq: Once | INTRAVENOUS | Status: AC
  Administered 2019-10-24: 1500 mg via INTRAVENOUS
  Filled 2019-10-24: qty 300

## 2019-10-24 MED ORDER — HYDROCODONE-ACETAMINOPHEN 5-325 MG PO TABS
1.0000 | ORAL_TABLET | Freq: Four times a day (QID) | ORAL | Status: DC | PRN
  Administered 2019-10-24 – 2019-10-25 (×2): 1 via ORAL
  Filled 2019-10-24 (×2): qty 1

## 2019-10-24 MED ORDER — LORATADINE 10 MG PO TABS
10.0000 mg | ORAL_TABLET | Freq: Every day | ORAL | Status: DC
  Administered 2019-10-25 – 2019-11-12 (×17): 10 mg via ORAL
  Filled 2019-10-24 (×18): qty 1

## 2019-10-24 MED ORDER — GABAPENTIN 100 MG PO CAPS
100.0000 mg | ORAL_CAPSULE | Freq: Every evening | ORAL | Status: DC | PRN
  Administered 2019-11-06 – 2019-11-12 (×2): 100 mg via ORAL
  Filled 2019-10-24 (×2): qty 1

## 2019-10-24 MED ORDER — VANCOMYCIN HCL IN DEXTROSE 1-5 GM/200ML-% IV SOLN
1000.0000 mg | Freq: Two times a day (BID) | INTRAVENOUS | Status: DC
  Administered 2019-10-25 – 2019-10-28 (×6): 1000 mg via INTRAVENOUS
  Filled 2019-10-24 (×7): qty 200

## 2019-10-24 MED ORDER — ONDANSETRON HCL 4 MG PO TABS: 4.0000 mg | ORAL_TABLET | Freq: Four times a day (QID) | ORAL | Status: DC | PRN

## 2019-10-24 MED ORDER — SODIUM CHLORIDE 0.9 % IV BOLUS
1000.0000 mL | Freq: Once | INTRAVENOUS | Status: AC
  Administered 2019-10-24: 1000 mL via INTRAVENOUS

## 2019-10-24 NOTE — H&P (Signed)
History and Physical    Susan Todd AXE:[id number] DOB: 01-Jan-1966 DOA: 10/24/2019  PCP: Georgann Housekeeper, MD   Patient coming from: Home Chief Complaint  Patient presents with  . Abdominal Pain     HPI: Susan Todd is a 54 y.o. female with medical history significant for per HTN/HLD on Lipitor 10 mg/aspirin 81 mg/losartan 25 mg/Lopressor 25 mg bid, anxiety/depression on Ativan 1 mg bedtime /Neurontin/hydrocodone, seizure disorder, COPD with history of previous diverticular disease presented with abdominal pain up to 9 out of 10 in lower abdomen worse with movement and palpation. Pain going on since early July initially with vaginal discharge, seen by her gynecologist and had multiple evaluation and was on different antibiotics subsequently referred to GI and had to CT abdomen pelvis most recent one was done 10/23/2019 which showed several abscesses within the abdomen coming from vagina rectal region with concern for potential perforated appendicitis, she has been on Cipro/Flagyl did not tolerate well and most recently on Augmentin, 8 days prior to admission with minimal improvement.  Her GI has referred her to the ED for admission with IR and surgical evaluation.  ED Course: Vitals stable but soft BP 90-100s, normal lactic acid 1.7 blood work with mild leukocytosis. CT abd/pelvis reported as : " 1. 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 This portion of the findings is essentially unchanged compared to the prior study. 2. Enlarging cystic, fluid containing area in the RIGHT adnexa may represent worsening of abscess adjacent to or involving the ovary. Difficult to determine definitively on the current study, reactive ovarian cyst is felt less likely though remains a differential consideration. There is no gas within this area  currently. 3. Aortic atherosclerosis  For clarification, the central gas and fluid containing collection the pelvis is an abscess within the central portion of a complex fistulous network. Colonic thickening and commented on in the initial report and remains evident likely due to inflammation. By report, according to the referring provider, the patient may have a rectal mass. Follow-up colonoscopy is suggested for complete evaluation to exclude the possibility of concurrent neoplasm as outlined in the initial report."  General surgery was consulted who requested admission under medicine and consult IR and GYN, I have requested EDP to consult gynecology and  IR. She reports she did not eat anything yesterday for ct abd and today because she was in waiting area, Patient otherwise She denies any nausea, vomiting, chest pain, shortness of breath, fever, chills, headache, focal weakness, numbness tingling, speech difficulties. Had BM yesterday regular. She reports he pain is coming back and asking for iv meds, okay with morphine.  Review of Systems:All systems were reviewed and were negative except as mentioned in HPI above. Negative for fever Negative for chest pain Negative for shortness of breath  Past Medical History:  Diagnosis Date  . Alcoholism (HCC)   . Anxiety   . Arthritis   . Atrial fibrillation (HCC)    "pt. has no knowledge of this" as of 03-07-14  . Carotid artery occlusion   . COPD (chronic obstructive pulmonary disease) (HCC)    Emphysema  . Depression   . Diverticulosis   . Hypercholesterolemia   . Hypertension   . Seizure disorder (HCC)   . Vaginal candidiasis     Past Surgical History:  Procedure Laterality Date  . BUNIONECTOMY Bilateral   . CYST EXCISION PERINEAL  2'29- Dr. Elana Alm     reports that she has been smoking cigarettes. She has a 30.00 pack-year smoking history. She has never used smokeless tobacco. She reports previous alcohol use. She reports that  she does not use drugs.  Allergies  Allergen Reactions  . Codeine Nausea And Vomiting  . Latex Hives  . Metronidazole Diarrhea    Patient is not able to take tablet form.    Family History  Problem Relation Age of Onset  . Heart disease Mother        NOT  before age 67  . Hyperlipidemia Mother   . Dementia Mother   . Hypertension Mother   . Depression Mother   . COPD Mother   . Other Mother        Covid 19-deceased from  . Hyperlipidemia Father   . Hypertension Father   . Colon polyps Father   . Melanoma Father   . Hyperlipidemia Brother   . Hypertension Brother   . Heart disease Brother        Arhymia  . Alcohol abuse Brother   . Depression Brother   . Melanoma Brother   . Heart disease Maternal Aunt   . Alcoholism Son      Prior to Admission medications   Medication Sig Start Date End Date Taking? Authorizing Provider  acetaminophen (TYLENOL) 500 MG tablet Take 500 mg by mouth every 6 (six) hours as needed for mild pain or headache.    Yes [provider]  albuterol (VENTOLIN HFA) 108 (90 Base) MCG/ACT inhaler Inhale 2 puffs into the lungs every 6 (six) hours as needed for wheezing or shortness of breath.  07/25/19  Yes [provider]  amoxicillin-clavulanate (AUGMENTIN) 875-125 MG tablet Take 1 tablet by mouth 2 (two) times daily for 14 days. 10/17/19 10/31/19 Yes Unk Lightning, PA  aspirin EC 81 MG tablet Take 81 mg by mouth daily.   Yes [provider]  atorvastatin (LIPITOR) 10 MG tablet Take 10 mg by mouth every evening.    Yes [provider]  calcium-vitamin D (OSCAL WITH D) 500-200 MG-UNIT TABS tablet Take 1 tablet by mouth at bedtime.   Yes [provider]  cetirizine (ZYRTEC) 5 MG tablet Take 5 mg by mouth at bedtime.   Yes [provider]  gabapentin (NEURONTIN) 100 MG capsule Take 100 mg by mouth at bedtime as needed (pain).  06/06/18  Yes [provider]  HYDROcodone-acetaminophen  (NORCO/VICODIN) 5-325 MG tablet Take 1 tablet by mouth every 6 (six) hours as needed for up to 7 days for moderate pain or severe pain. 10/17/19 10/24/19 Yes Lemmon, Violet Baldy, PA  LORazepam (ATIVAN) 1 MG tablet Take 1 tablet (1 mg total) by mouth at bedtime. Patient taking differently: Take 1 mg by mouth daily as needed for anxiety.  10/02/19  Yes Theresia Majors, MD  losartan (COZAAR) 25 MG tablet Take 25 mg by mouth daily.   Yes [provider]  metoprolol tartrate (LOPRESSOR) 25 MG tablet Take 25 mg by mouth 2 (two) times daily.   Yes [provider]  Multiple Vitamin (MULTI-VITAMIN) tablet Take 1 tablet by mouth at bedtime.    Yes [provider]  HYDROcodone-acetaminophen (NORCO/VICODIN) 5-325 MG tablet Take 1 tablet by mouth every 6 (six) hours as needed for severe pain. 10/21/19   Armbruster, Willaim Rayas, MD  ibuprofen (ADVIL) 800 MG tablet Take 1 tablet (800 mg total) by mouth every 8 (eight) hours as needed. Patient not  taking: Reported on 10/24/2019 09/05/19   Theresia Majors, MD    Physical Exam: Vitals:   10/24/19 0750 10/24/19 0751 10/24/19 1256 10/24/19 1643  BP: 105/67  91/64 109/72  Pulse: 96  86 80  Resp: 16  18 (!) 22  Temp: (!) 97.5 F (36.4 C) 97.8 F (36.6 C)    TempSrc: Oral Oral    SpO2: 100%  97% 99%  Weight:  78.5 kg    Height:  5\' 8"  (1.727 m)      General exam: AAOx3 , NAD, weak appearing. HEENT:Oral mucosa moist, Ear/Nose WNL grossly, dentition normal. Respiratory system: bilaterally clear,no wheezing or crackles,no use of accessory muscle Cardiovascular system: S1 & S2 +, No JVD,. Gastrointestinal system: Abdomen soft, Tenderness+  On lower abdomen accross,ND, BS+ Nervous System:Alert, awake, moving extremities and grossly nonfocal Extremities: No edema, distal peripheral pulses palpable.  Skin: No rashes,no icterus. MSK: Normal muscle bulk,tone, power   Labs on Admission: I have personally reviewed following labs and imaging  studies  CBC: Recent Labs  Lab 10/24/19 1154  WBC 12.6*  HGB 10.2*  HCT 32.5*  MCV 102.5*  PLT 464*   Basic Metabolic Panel: Recent Labs  Lab 10/24/19 1154  NA 136  K 3.7  CL 102  CO2 25  GLUCOSE 107*  BUN 8  CREATININE 0.68  CALCIUM 9.1   GFR: Estimated Creatinine Clearance: 86.4 mL/min (by C-G formula based on SCr of 0.68 mg/dL). Liver Function Tests: Recent Labs  Lab 10/24/19 1154  AST 12*  ALT 9  ALKPHOS 92  BILITOT 0.6  PROT 7.3  ALBUMIN 3.4*   No results for input(s): LIPASE, AMYLASE in the last 168 hours. No results for input(s): AMMONIA in the last 168 hours. Coagulation Profile: No results for input(s): INR, PROTIME in the last 168 hours. Cardiac Enzymes: No results for input(s): CKTOTAL, CKMB, CKMBINDEX, TROPONINI in the last 168 hours. BNP (last 3 results) No results for input(s): PROBNP in the last 8760 hours. HbA1C: No results for input(s): HGBA1C in the last 72 hours. CBG: No results for input(s): GLUCAP in the last 168 hours. Lipid Profile: No results for input(s): CHOL, HDL, LDLCALC, TRIG, CHOLHDL, LDLDIRECT in the last 72 hours. Thyroid Function Tests: No results for input(s): TSH, T4TOTAL, FREET4, T3FREE, THYROIDAB in the last 72 hours. Anemia Panel: No results for input(s): VITAMINB12, FOLATE, FERRITIN, TIBC, IRON, RETICCTPCT in the last 72 hours. Urine analysis:    Component Value Date/Time   COLORURINE STRAW (A) 08/10/2018 2027   APPEARANCEUR CLEAR 08/10/2018 2027   LABSPEC 1.004 (L) 08/10/2018 2027   PHURINE 5.0 08/10/2018 2027   GLUCOSEU NEGATIVE 08/10/2018 2027   HGBUR SMALL (A) 08/10/2018 2027   BILIRUBINUR NEGATIVE 08/10/2018 2027   KETONESUR 5 (A) 08/10/2018 2027   PROTEINUR NEGATIVE 08/10/2018 2027   UROBILINOGEN 0.2 08/23/2006 1144   NITRITE NEGATIVE 08/10/2018 2027   LEUKOCYTESUR NEGATIVE 08/10/2018 2027    Radiological Exams on Admission: CT Abdomen Pelvis W Contrast  Addendum Date: 10/23/2019   ADDENDUM REPORT:  10/23/2019 17:36 ADDENDUM: For clarification, the central gas and fluid containing collection the pelvis is an abscess within the central portion of a complex fistulous network. Colonic thickening and commented on in the initial report and remains evident likely due to inflammation. By report, according to the referring provider, the patient may have a rectal mass. Follow-up colonoscopy is suggested for complete evaluation to exclude the possibility of concurrent neoplasm as outlined in the initial report. These results were discussed by  telephone at the time of interpretation on 10/23/2019 at 5:05 pm to provider [facility] , who verbally acknowledged these results. Electronically Signed   By: Donzetta KohutGeoffrey  Wile M.D.   On: 10/23/2019 17:36   Result Date: 10/23/2019 CLINICAL DATA:  Abdominal abscess/infection suspected. EXAM: CT ABDOMEN AND PELVIS WITH CONTRAST TECHNIQUE: Multidetector CT imaging of the abdomen and pelvis was performed using the standard protocol following bolus administration of intravenous contrast. CONTRAST:  100mL OMNIPAQUE IOHEXOL 300 MG/ML  SOLN COMPARISON:  Prior study of October 09, 2019 FINDINGS: Lower chest: Lung bases are clear.  No effusion.  No consolidation. Hepatobiliary: Lobular hepatic contours, no suspicious lesion. Portal vein is patent. Hepatic veins are patent. No pericholecystic stranding. Pancreas: Pancreas is normal without ductal dilation or inflammation. Spleen: Spleen normal in size and contour. Adrenals/Urinary Tract: Adrenal glands are normal. Symmetric renal enhancement. No hydronephrosis. Urinary bladder with adjacent stranding which is mainly associated with the uterus, see below. Stomach/Bowel: Stomach unremarkable by CT. No acute small bowel process. Marked thickening of the appendix which extends into a gas in fluid collection in the pelvis which is bounded posteriorly and LEFT antral laterally by the sigmoid colon with very similar appearance when compared to the  previous study. This gas and fluid containing collection measures 3.5 x 3.0 cm as compared to 3.4 x 3.5 cm. Gas still tracks towards the wall and potentially into the wall of the sigmoid. RIGHT adnexal process measuring 3.2 x 2.5 cm previously 2.9 x 2.4 cm. Extensive stranding throughout the pelvis through fascial planes tracking towards the sacral promontory. Vascular/Lymphatic: Calcified noncalcified atheromatous plaque of the abdominal aorta. No aneurysmal dilation. No adenopathy with numerous scattered small lymph nodes throughout the retroperitoneum presumably reactive. Scattered pelvic lymph nodes also likely reactive. Reproductive: Uterus and upper vagina in very close proximity to the pelvic fluid collection and fistulous network. Enhancing tract extends towards the vaginal apex best seen on sagittal images, sagittal image 55, behind the inflamed rectosigmoid there is a crescent-shaped tract with enhancement that extends towards the vaginal apex. Gas present in the vagina as well. RIGHT adnexal process with fluid collection suspicious for abscess either adjacent to or involving the RIGHT ovary similar to the prior exam. Stranding throughout mesorectal fascial planes of the upper mesorectum and along the RIGHT and LEFT pelvic sidewall with similar appearance. Other: No free intraperitoneal air outside of the pelvis and the loculated collection at the center of the fistulous network. Musculoskeletal: No acute musculoskeletal process. No destructive bone finding. IMPRESSION: 1. 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 This portion of the findings is essentially unchanged compared to the prior study. 2. Enlarging cystic, fluid containing area in the RIGHT adnexa may represent worsening of abscess adjacent to or involving the ovary. Difficult  to determine definitively on the current study, reactive ovarian cyst is felt less likely though remains a differential consideration. There is no gas within this area currently. 3. Aortic atherosclerosis. These results will be called to the ordering clinician or representative by the Radiologist Assistant, and communication documented in the PACS or [employer]. Aortic Atherosclerosis (ICD10-I70.0). Electronically Signed: By: Donzetta KohutGeoffrey  Wile M.D. On: 10/23/2019 15:42     Assessment/Plan Intra-abdominal abscess with proximal fistulous [place], colonic thickening, possible rectal mass with Sepsis GNF:[id number]:Patient meets sepsis criteria with leukocytosis 12K,  RR > 20, HR > 90: GI  had discussed with IR= Dr Lindie SpruceWyatt,  mid pelvic abscesses does not appear to be an eval for tenderness while the right adnexal abscess may be, concerned about appendicitis causing this presentation VS OTHERS, failed outpatient oral antibiotics and requested surgical evaluation also consulted GYN by ED.  We will keep on IV fluids, IV vancomycin/Zosyn and quickly deescalate to zosyn tomorrow, cont pain control, supportive care. Currently blood pressure soft but stable, lactic acid normal. reports pain is back after fentanyl. Check serial lactate.  HTN:BP soft.Hold home losartan 25 mg/Lopressor 25 mg bid,   Anemia- monitor hb,?etiology at 10 gm.  NWG:NFAO home asa and lipitor.  Anxiety/depression/Hx of Seizure disorder: cont home Ativan 1 mg bedtime/Neurontin/hydrocodone Cont the same.  COPD: not in exacerbation.   Tobacco abuse: keep on nicotine patch  Body mass index is 26.31 kg/m.   Severity of Illness:  * I certify that at the point of admission it is my clinical judgment that the patient will require inpatient hospital care spanning beyond 2 midnights from the point of admission due to high intensity of service, high risk for further deterioration and high frequency of surveillance required due to failing of outpatient  oral antibiotics for intra-abdominal abscess.*   DVT prophylaxis: Lovenox Code Status:   Code Status: Full Code Family Communication: Admission, patients condition and plan of care including tests being ordered have been discussed with the patient who indicate understanding and agree with the plan and Code Status.  Consults called:  [facility] GYN  Lanae Boast MD [facility]  If 7PM-7AM, please contact night-coverage [email or web address]  10/24/2019, 6:28 PM

## 2019-10-24 NOTE — Telephone Encounter (Signed)
Called IR, spoke with Dr. Lindie Spruce about her case to see if either of these abscesses is amenable to drainage. The mid pelvic abscess does not appear to be, while R adnexal abscess may be. Otherwise reviewed other findings, he is more concerned about appendicitis causing this presentation. She is failing outpatient management, I think she warrants IV antibiotics and another surgical consult, IR may consider drain pending surgical consultation. She is in the ED now, I relayed my concerns to her, she agreed

## 2019-10-24 NOTE — ED Provider Notes (Signed)
[facility]-EMERGENCY DEPT Provider Note   CSN: [id number] Arrival date & time: 10/24/19  0735     History Chief Complaint  Patient presents with  . Abdominal Pain    Susan Todd is a 54 y.o. female.  The history is provided by the patient and medical records. No language interpreter was used.  Abdominal Pain    54 year old female significant history of alcohol abuse, COPD, diverticular disease, seizure disorder, hypertension, presenting for evaluation of abdominal pain.  Patient report since the early part of July she developed a vaginal discharge which is entirely abnormal for her as she has not been sexually active.  She was seen evaluate by her gynecologist.  She has had numerous testing done and was given different types antibiotic.  She was also referred to a GI specialist for her continuous symptoms as they were worried that this could be a GI issue.  She has had 2 separate abdominal and pelvic CT scans done with the last one done yesterday which demonstrates several abscess within her abdomen that appears to be coming from vaginal rectal region and also concern for potential perforated appendicitis causing of of these findings.  She has been on several different antibiotic including Cipro and Flagyl that she did not tolerate well.  She has been taking Augmentin for the past 8 days with minimal improvement.  She endorsed 9 out of 10 pain to her lower abdomen worse with movement and palpation.  She also endorsed continued vaginal discharge and going through multiple depends a day.  She endorsed having fecal material coming through her vagina.  She denies any significant rectal pain or blood in her stools.  Her last bowel movement was yesterday.  She does endorse some increased urinary frequency without urgency or burning sensation.  Does not complain of any fever chills no chest pain or shortness of breath no nausea vomiting.  She is up-to-date with her Covid vaccination.   Patient denies any history of active cancer.  She does endorse weight loss but denies fever or night sweats.    Past Medical History:  Diagnosis Date  . Alcoholism (HCC)   . Anxiety   . Arthritis   . Atrial fibrillation (HCC)    "pt. has no knowledge of this" as of 03-07-14  . Carotid artery occlusion   . COPD (chronic obstructive pulmonary disease) (HCC)    Emphysema  . Depression   . Diverticulosis   . Hypercholesterolemia   . Hypertension   . Seizure disorder (HCC)   . Vaginal candidiasis     Patient Active Problem List   Diagnosis Date Noted  . Alcoholic ketoacidosis 08/13/2018  . Hyponatremia 08/13/2018  . Hypokalemia 08/13/2018  . Hyponatremia with normal extracellular fluid volume 08/10/2018  . Seizure (HCC) 11/04/2016  . Seizures (HCC) 11/03/2016  . COPD (chronic obstructive pulmonary disease) (HCC) 11/03/2016  . Alcohol abuse 11/03/2016  . Cocaine use 11/03/2016  . Shortness of breath-with exertion 09/23/2013  . Occlusion and stenosis of carotid artery without mention of cerebral infarction 08/22/2011  . Depression   . Anxiety   . Arthritis     Past Surgical History:  Procedure Laterality Date  . BUNIONECTOMY Bilateral   . CYST EXCISION PERINEAL     8'03- Dr. Elana Alm     OB History    Gravida  3   Para  2   Term      Preterm      AB  1   Living  2     SAB      TAB      Ectopic      Multiple      Live Births              Family History  Problem Relation Age of Onset  . Heart disease Mother        NOT  before age 82  . Hyperlipidemia Mother   . Dementia Mother   . Hypertension Mother   . Depression Mother   . COPD Mother   . Other Mother        Covid 19-deceased from  . Hyperlipidemia Father   . Hypertension Father   . Colon polyps Father   . Melanoma Father   . Hyperlipidemia Brother   . Hypertension Brother   . Heart disease Brother        Arhymia  . Alcohol abuse Brother   . Depression Brother   . Melanoma  Brother   . Heart disease Maternal Aunt   . Alcoholism Son     Social History   Tobacco Use  . Smoking status: Current Every Day Smoker    Packs/day: 1.00    Years: 30.00    Pack years: 30.00    Types: Cigarettes  . Smokeless tobacco: Never Used  . Tobacco comment: pt states that she knows she needs to quit but has been stressed and uses cigs as a resort  Vaping Use  . Vaping Use: Never used  Substance Use Topics  . Alcohol use: Not Currently    Comment: hx heavy alcohol use  . Drug use: No    Comment: abused drugs in the past 1989    Home Medications Prior to Admission medications   Medication Sig Start Date End Date Taking? Authorizing Provider  acetaminophen (TYLENOL) 500 MG tablet Take 1,500 mg by mouth every 6 (six) hours as needed for mild pain or headache.    [provider]  albuterol (VENTOLIN HFA) 108 (90 Base) MCG/ACT inhaler SMARTSIG:1 Puff(s) Via Inhaler Every 6 Hours PRN 07/25/19   [provider]  amoxicillin-clavulanate (AUGMENTIN) 875-125 MG tablet Take 1 tablet by mouth 2 (two) times daily for 14 days. 10/17/19 10/31/19  Unk Lightning, PA  aspirin EC 81 MG tablet Take 81 mg by mouth daily.    [provider]  atorvastatin (LIPITOR) 10 MG tablet Take 10 mg by mouth every evening.     [provider]  calcium-vitamin D (OSCAL WITH D) 500-200 MG-UNIT TABS tablet Take 1 tablet by mouth at bedtime.    [provider]  cetirizine (ZYRTEC) 5 MG tablet Take 5 mg by mouth at bedtime.    [provider]  gabapentin (NEURONTIN) 100 MG capsule Take 100 mg by mouth at bedtime. 06/06/18   [provider]  HYDROcodone-acetaminophen (NORCO/VICODIN) 5-325 MG tablet Take 1 tablet by mouth every 6 (six) hours as needed for up to 7 days for moderate pain or severe pain. 10/17/19 10/24/19  Unk Lightning, PA  HYDROcodone-acetaminophen (NORCO/VICODIN) 5-325 MG tablet Take 1 tablet by mouth every 6 (six) hours as  needed for severe pain. 10/21/19   Armbruster, Willaim Rayas, MD  ibuprofen (ADVIL) 800 MG tablet Take 1 tablet (800 mg total) by mouth every 8 (eight) hours as needed. 09/05/19   Theresia Majors, MD  LORazepam (ATIVAN) 1 MG tablet Take 1 tablet (1 mg total) by mouth at bedtime. 10/02/19   Theresia Majors, MD  losartan (COZAAR) 25  MG tablet Take 25 mg by mouth daily.    [provider]  metoprolol tartrate (LOPRESSOR) 25 MG tablet Take 25 mg by mouth 2 (two) times daily.    [provider]  Multiple Vitamin (MULTI-VITAMIN) tablet Take 1 tablet by mouth daily.    [provider]    Allergies    Codeine, Latex, and Metronidazole  Review of Systems   Review of Systems  Gastrointestinal: Positive for abdominal pain.  All other systems reviewed and are negative.   Physical Exam Updated Vital Signs BP 91/64   Pulse 86   Temp 97.8 F (36.6 C) (Oral)   Resp 18   Ht  (1.727 m)   Wt 78.5 kg   LMP 11/20/2012   SpO2 97%   BMI 26.31 kg/m   Physical Exam Vitals and nursing note reviewed.  Constitutional:      General: She is not in acute distress.    Appearance: She is well-developed. She is obese.  HENT:     Head: Atraumatic.  Eyes:     Conjunctiva/sclera: Conjunctivae normal.  Cardiovascular:     Rate and Rhythm: Normal rate and regular rhythm.     Heart sounds: Normal heart sounds.  Pulmonary:     Effort: Pulmonary effort is normal.     Breath sounds: Normal breath sounds. No wheezing, rhonchi or rales.  Abdominal:     General: Abdomen is flat.     Palpations: Abdomen is soft.     Tenderness: There is abdominal tenderness (Exquisite tenderness to suprapubic region with guarding but no rebound tenderness.) in the suprapubic area.  Genitourinary:    Comments: Pelvic examination is deferred. Musculoskeletal:     Cervical back: Neck supple.  Skin:    Findings: No rash.  Neurological:     Mental Status: She is alert.     ED Results / Procedures /  Treatments   Labs (all labs ordered are listed, but only abnormal results are displayed) Labs Reviewed  COMPREHENSIVE METABOLIC PANEL - Abnormal; Notable for the following components:      Result Value   Glucose, Bld 107 (*)    Albumin 3.4 (*)    AST 12 (*)    All other components within normal limits  CBC - Abnormal; Notable for the following components:   WBC 12.6 (*)    RBC 3.17 (*)    Hemoglobin 10.2 (*)    HCT 32.5 (*)    MCV 102.5 (*)    RDW 16.0 (*)    Platelets 464 (*)    All other components within normal limits  SARS CORONAVIRUS 2 BY RT PCR (HOSPITAL ORDER, PERFORMED IN [facility])  CULTURE, BLOOD (ROUTINE X 2)  CULTURE, BLOOD (ROUTINE X 2)  LACTIC ACID, PLASMA  URINALYSIS, ROUTINE W REFLEX MICROSCOPIC  LACTIC ACID, PLASMA  HIV ANTIBODY (ROUTINE TESTING W REFLEX)  PROTIME-INR  PROCALCITONIN  CBC  CREATININE, SERUM  COMPREHENSIVE METABOLIC PANEL  CBC    EKG None  Radiology CT Abdomen Pelvis W Contrast  Addendum Date: 10/23/2019   ADDENDUM REPORT: 10/23/2019 17:36 ADDENDUM: For clarification, the central gas and fluid containing collection the pelvis is an abscess within the central portion of a complex fistulous network. Colonic thickening and commented on in the initial report and remains evident likely due to inflammation. By report, according to the referring provider, the patient may have a rectal mass. Follow-up colonoscopy is suggested for complete evaluation to exclude the possibility of concurrent neoplasm as  outlined in the initial report. These results were discussed by telephone at the time of interpretation on 10/23/2019 at 5:05 pm to provider [facility] , who verbally acknowledged these results. Electronically Signed   By: Donzetta Kohut M.D.   On: 10/23/2019 17:36   Result Date: 10/23/2019 CLINICAL DATA:  Abdominal abscess/infection suspected. EXAM: CT ABDOMEN AND PELVIS WITH CONTRAST TECHNIQUE: Multidetector CT imaging of the abdomen  and pelvis was performed using the standard protocol following bolus administration of intravenous contrast. CONTRAST:  OMNIPAQUE IOHEXOL 300 MG/ML  SOLN COMPARISON:  Prior study of October 09, 2019 FINDINGS: Lower chest: Lung bases are clear.  No effusion.  No consolidation. Hepatobiliary: Lobular hepatic contours, no suspicious lesion. Portal vein is patent. Hepatic veins are patent. No pericholecystic stranding. Pancreas: Pancreas is normal without ductal dilation or inflammation. Spleen: Spleen normal in size and contour. Adrenals/Urinary Tract: Adrenal glands are normal. Symmetric renal enhancement. No hydronephrosis. Urinary bladder with adjacent stranding which is mainly associated with the uterus, see below. Stomach/Bowel: Stomach unremarkable by CT. No acute small bowel process. Marked thickening of the appendix which extends into a gas in fluid collection in the pelvis which is bounded posteriorly and LEFT antral laterally by the sigmoid colon with very similar appearance when compared to the previous study. This gas and fluid containing collection measures 3.5 x 3.0 cm as compared to 3.4 x 3.5 cm. Gas still tracks towards the wall and potentially into the wall of the sigmoid. RIGHT adnexal process measuring 3.2 x 2.5 cm previously 2.9 x 2.4 cm. Extensive stranding throughout the pelvis through fascial planes tracking towards the sacral promontory. Vascular/Lymphatic: Calcified noncalcified atheromatous plaque of the abdominal aorta. No aneurysmal dilation. No adenopathy with numerous scattered small lymph nodes throughout the retroperitoneum presumably reactive. Scattered pelvic lymph nodes also likely reactive. Reproductive: Uterus and upper vagina in very close proximity to the pelvic fluid collection and fistulous network. Enhancing tract extends towards the vaginal apex best seen on sagittal images, sagittal image 55, behind the inflamed rectosigmoid there is a crescent-shaped tract with  enhancement that extends towards the vaginal apex. Gas present in the vagina as well. RIGHT adnexal process with fluid collection suspicious for abscess either adjacent to or involving the RIGHT ovary similar to the prior exam. Stranding throughout mesorectal fascial planes of the upper mesorectum and along the RIGHT and LEFT pelvic sidewall with similar appearance. Other: No free intraperitoneal air outside of the pelvis and the loculated collection at the center of the fistulous network. Musculoskeletal: No acute musculoskeletal process. No destructive bone finding. IMPRESSION: 1. 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 This portion of the findings is essentially unchanged compared to the prior study. 2. Enlarging cystic, fluid containing area in the RIGHT adnexa may represent worsening of abscess adjacent to or involving the ovary. Difficult to determine definitively on the current study, reactive ovarian cyst is felt less likely though remains a differential consideration. There is no gas within this area currently. 3. Aortic atherosclerosis. These results will be called to the ordering clinician or representative by the Radiologist Assistant, and communication documented in the PACS or [employer]. Aortic Atherosclerosis (ICD10-I70.0). Electronically Signed: By: Donzetta Kohut M.D. On: 10/23/2019 15:42    Procedures Procedures (including critical care time)  Medications Ordered in ED Medications  aspirin EC tablet 81 mg (has no administration in time range)  atorvastatin (LIPITOR)  tablet 10 mg (has no administration in time range)  calcium-vitamin D (OSCAL WITH D) 500-200 MG-UNIT per tablet 1 tablet (has no administration in time range)  loratadine (CLARITIN) tablet 10 mg (has no administration in time range)  gabapentin  (NEURONTIN) capsule 100 mg (has no administration in time range)  HYDROcodone-acetaminophen (NORCO/VICODIN) 5-325 MG per tablet 1 tablet (1 tablet Oral Given 10/24/19 1856)  LORazepam (ATIVAN) tablet 1 mg (has no administration in time range)  multivitamin with minerals tablet 1 tablet (has no administration in time range)  enoxaparin (LOVENOX) injection 40 mg (has no administration in time range)  lactated ringers bolus 1,000 mL (has no administration in time range)  lactated ringers infusion (has no administration in time range)  acetaminophen (TYLENOL) tablet 650 mg (has no administration in time range)    Or  acetaminophen (TYLENOL) suppository 650 mg (has no administration in time range)  senna-docusate (Senokot-S) tablet 1 tablet (has no administration in time range)  ondansetron (ZOFRAN) tablet 4 mg (has no administration in time range)    Or  ondansetron (ZOFRAN) injection 4 mg (has no administration in time range)  morphine 2 MG/ML injection 2 mg (has no administration in time range)  nicotine (NICODERM CQ - dosed in mg/24 hr) patch 7 mg (has no administration in time range)  piperacillin-tazobactam (ZOSYN) IVPB 3.375 g (has no administration in time range)  vancomycin (VANCOCIN) IVPB 1000 mg/200 mL premix (has no administration in time range)  vancomycin (VANCOREADY) IVPB 1500 mg/300 mL (has no administration in time range)  albuterol (PROVENTIL) (2.5 MG/3ML) 0.083% nebulizer solution 2.5 mg (has no administration in time range)  fentaNYL (SUBLIMAZE) injection 50 mcg (50 mcg Intravenous Given 10/24/19 1708)  sodium chloride 0.9 % bolus 1,000 mL (1,000 mLs Intravenous New Bag/Given 10/24/19 1709)  piperacillin-tazobactam (ZOSYN) IVPB 3.375 g (3.375 g Intravenous New Bag/Given 10/24/19 1740)    ED Course  I have reviewed the triage vital signs and the nursing notes.  Pertinent labs & imaging results that were available during my care of the patient were reviewed by me and considered in my  medical decision making (see chart for details).    MDM Rules/Calculators/A&P                          BP 109/72   Pulse 80   Temp 97.8 F (36.6 C) (Oral)   Resp (!) 22   Ht 5\' 8"  (1.727 m)   Wt 78.5 kg   LMP 11/20/2012   SpO2 99%   BMI 26.31 kg/m   Final Clinical Impression(s) / ED Diagnoses Final diagnoses:  Abdominopelvic abscess (HCC)  Intra-abdominal abscess (HCC)    Rx / DC Orders ED Discharge Orders    None     4:59 PM Pt here with lower abd pain ongoing for several months and a recent CT scan showing a complex fistulous network of changes which includes potential abscess to the R lower abdomen from potential perforated appendix vs ovarian source.  SHe has evidence of rectovaginal fistula.  She has failed outpt abx treatment which includes cipro/flagyl and now augmentin.  She will need to be evaluated by general surgery for further assessment and management.  She may need IR intervention if surgery deemed necessary.  CUrrently pt is afebrile, but mildly hypotensive.  She is not tachycardic.  Does not meet SIRS criteria.  WBC is 12.6, lactic acid is currently pending.  Will give IVF, pain medication and antinausea medication.  5:22 PM Appreciate consultation from on call general surgery Dr. Gerrit Friends who recommend medicine admission.  He will notify Dr. Maisie Fus to be involve in pt care.  He also recommend IR and GYN to be consulted for their involvement in her care.   5:48 PM Appreciate consultation from [facility] DR. KC who request IR and GYN for consultation and he will see and admit pt.    7:02 PM I was able to consult interventional radiologist, Dr. Dorena Bodo who request for an order of IR guided percutaneous drainage to the abdomen and pelvis procedures to be ordered and the team will be involved in her care.  I have also consulted on-call gynecologist, Dr. Seymour Bars who will relay the message to Dr. Penni Bombard to also be involved in patient care tomorrow.   Susan Todd was evaluated in Emergency Department on 10/24/2019 for the symptoms described in the history of present illness. She was evaluated in the context of the global COVID-19 pandemic, which necessitated consideration that the patient might be at risk for infection with the SARS-CoV-2 virus that causes COVID-19. Institutional protocols and algorithms that pertain to the evaluation of patients at risk for COVID-19 are in a state of rapid change based on information released by regulatory bodies including the CDC and federal and state organizations. These policies and algorithms were followed during the patient's care in the ED.    Fayrene Helper, PA-C 10/24/19 1925    Gerhard Munch, MD 10/29/19 [phone]

## 2019-10-24 NOTE — ED Triage Notes (Signed)
Pt presents to ED via walk in, c/o generalized abd pain x4 months, states she has been seen by gastro, told yesterday she had multiple abscesses and "colorectal issues" and to go to ED this morning for admission for surgery.

## 2019-10-24 NOTE — Progress Notes (Signed)
Pharmacy Antibiotic Note  Susan Todd is a 54 y.o. female admitted on 10/24/2019 with intra-abdominal infection. Did not tolerate Cipro/Flagyl, and minimal improvement after Augmentin x 8 days as outpatient. Pharmacy has been consulted for vancomycin dosing.  Plan:  Vancomycin 1500 mg IV now, then 1000 mg IV q12 hr (est AUC 500 based on SCr 0.8; Vd 0.72)  Measure vancomycin AUC at steady state as indicated  SCr daily while on vanc and Zosyn together  Zosyn per MD, dosing appropriate   Height: 5\' 8"  (172.7 cm) Weight: 78.5 kg (173 lb 1 oz) IBW/kg (Calculated) : 63.9  Temp (24hrs), Avg:97.7 F (36.5 C), Min:97.5 F (36.4 C), Max:97.8 F (36.6 C)  Recent Labs  Lab 10/24/19 1154 10/24/19 1620  WBC 12.6*  --   CREATININE 0.68  --   LATICACIDVEN  --  1.7    Estimated Creatinine Clearance: 86.4 mL/min (by C-G formula based on SCr of 0.68 mg/dL).    Allergies  Allergen Reactions  . Codeine Nausea And Vomiting  . Latex Hives  . Metronidazole Diarrhea    Patient is not able to take tablet form.    Antimicrobials this admission: 9/2 vanc >>  9/2 Zosyn >>   Dose adjustments this admission: n/a  Microbiology results: None yet  Thank you for allowing pharmacy to be a part of this patient's care.  Cort Dragoo A 10/24/2019 6:45 PM

## 2019-10-25 ENCOUNTER — Other Ambulatory Visit: Payer: Self-pay

## 2019-10-25 DIAGNOSIS — K651 Peritoneal abscess: Secondary | ICD-10-CM

## 2019-10-25 LAB — COMPREHENSIVE METABOLIC PANEL
ALT: 8 U/L (ref 0–44)
AST: 9 U/L — ABNORMAL LOW (ref 15–41)
Albumin: 2.8 g/dL — ABNORMAL LOW (ref 3.5–5.0)
Alkaline Phosphatase: 74 U/L (ref 38–126)
Anion gap: 11 (ref 5–15)
BUN: 8 mg/dL (ref 6–20)
CO2: 23 mmol/L (ref 22–32)
Calcium: 8.7 mg/dL — ABNORMAL LOW (ref 8.9–10.3)
Chloride: 106 mmol/L (ref 98–111)
Creatinine, Ser: 0.67 mg/dL (ref 0.44–1.00)
GFR calc Af Amer: >60 mL/min (ref >60)
GFR calc non Af Amer: >60 mL/min (ref >60)
Glucose, Bld: 87 mg/dL (ref 70–99)
Potassium: 3.7 mmol/L (ref 3.5–5.1)
Sodium: 140 mmol/L (ref 135–145)
Total Bilirubin: 0.7 mg/dL (ref 0.3–1.2)
Total Protein: 5.9 g/dL — ABNORMAL LOW (ref 6.5–8.1)

## 2019-10-25 LAB — C-REACTIVE PROTEIN: CRP: 9.5 mg/dL — ABNORMAL HIGH (ref <1.0)

## 2019-10-25 LAB — SURGICAL PCR SCREEN
MRSA, PCR: NEGATIVE
Staphylococcus aureus: NEGATIVE

## 2019-10-25 LAB — CBC
HCT: 27.7 % — ABNORMAL LOW (ref 36.0–46.0)
Hemoglobin: 8.5 g/dL — ABNORMAL LOW (ref 12.0–15.0)
MCH: 31.8 pg (ref 26.0–34.0)
MCHC: 30.7 g/dL (ref 30.0–36.0)
MCV: 103.7 fL — ABNORMAL HIGH (ref 80.0–100.0)
Platelets: 341 10*3/uL (ref 150–400)
RBC: 2.67 MIL/uL — ABNORMAL LOW (ref 3.87–5.11)
RDW: 15.8 % — ABNORMAL HIGH (ref 11.5–15.5)
WBC: 7.5 10*3/uL (ref 4.0–10.5)
nRBC: 0 % (ref 0.0–0.2)

## 2019-10-25 LAB — HIV ANTIBODY (ROUTINE TESTING W REFLEX): HIV Screen 4th Generation wRfx: NONREACTIVE

## 2019-10-25 LAB — PROTIME-INR
INR: 1 (ref 0.8–1.2)
Prothrombin Time: 12.8 seconds (ref 11.4–15.2)

## 2019-10-25 LAB — PROCALCITONIN: Procalcitonin: <0.1 ng/mL

## 2019-10-25 MED ORDER — MORPHINE SULFATE (PF) 4 MG/ML IV SOLN
4.0000 mg | INTRAVENOUS | Status: DC | PRN
  Administered 2019-10-25 – 2019-11-13 (×85): 4 mg via INTRAVENOUS
  Filled 2019-10-25 (×86): qty 1

## 2019-10-25 MED ORDER — HYDROCODONE-ACETAMINOPHEN 5-325 MG PO TABS
1.0000 | ORAL_TABLET | Freq: Four times a day (QID) | ORAL | Status: DC | PRN
  Administered 2019-10-25 – 2019-10-26 (×4): 2 via ORAL
  Filled 2019-10-25 (×4): qty 2

## 2019-10-25 NOTE — Progress Notes (Signed)
PROGRESS NOTE    Susan Todd  ZOX:[id number] DOB: 1965/08/13 DOA: 10/24/2019 PCP: Wenda Low, MD   Chief Complaint  Patient presents with  . Abdominal Pain   Brief Narrative:  54 y.o. female with medical history significant for per HTN/HLD on Lipitor 10 mg/aspirin 81 mg/losartan 25 mg/Lopressor 25 mg bid, anxiety/depression on Ativan 1 mg bedtime /Neurontin/hydrocodone, seizure disorder, COPD with history of previous diverticular disease presented with abdominal pain up to 9 out of 10 in lower abdomen worse with movement and palpation. Pain going on since early July initially with vaginal discharge, seen by her gynecologist and had multiple evaluation and was on different antibiotics subsequently referred to GI and had to CT abdomen pelvis most recent one was done 10/23/2019 which showed several abscesses within the abdomen coming from vagina rectal region with concern for potential perforated appendicitis, she has been on Cipro/Flagyl did not tolerate well and most recently on Augmentin, 8 days prior to admission with minimal improvement.  Her GI has referred her to the ED for admission with IR and surgical evaluation.  ED Course: Vitals stable but soft BP 90-100s, normal lactic acid 1.7 blood work with mild leukocytosis. CT abd/pelvis reported as : " 1. 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 This portion of the findings is essentially unchanged compared to the prior study. 2. Enlarging cystic, fluid containing area in the RIGHT adnexa may represent worsening of abscess adjacent to or involving the ovary. Difficult to determine definitively on the current study, reactive ovarian cyst is felt less likely though remains a differential consideration. There is no gas within this area currently. 3. Aortic atherosclerosis  For  clarification, the central gas and fluid containing collection the pelvis is an abscess within the central portion of a complex fistulous network. Colonic thickening and commented on in the initial report and remains evident likely due to inflammation. By report, according to the referring provider, the patient may have a rectal mass. Follow-up colonoscopy is suggested for complete evaluation to exclude the possibility of concurrent neoplasm as outlined in the initial report."  General surgery was consulted who requested admission under medicine and consult IR and GYN, I have requested EDP to consult gynecology and  IR.   Subjective: Afebrile overnight, no acute events. Wbc better Has been getting noooc and iv morphine for pain   Assessment & Plan:  Intra-abdominal abscess with proximal fistulous [place], colonic thickening, possible rectal mass with Sepsis POA: Sepsis parameters resolved,on admission met sepsis criteria with leukocytosis 12K,  RR > 20, HR > 90:  Appreciate surgical input, small pelvic abscesses concerning for diverticulitis versus appendicitis possible rectal mass.  We will keep on IV fluids ice seeds, vancomycin and Zosyn hopefully can de-escalate to Zosyn alone soon.  Follow-up culture.  I have requested her GI to come on board given the complicated situation, IR does not feel there is a window for percutaneous abscess drainage. Gyn has been consulted in the ED awaiting further input. Please refer to [facility] input this am as well.  Pain is not well controlled I have increased 1-2 tabs and morphine to 4 mg IV prn.  HTN:BP is stable continue to hold home losartan 25 mg/Lopressor 25 mg bid.    Anemia-unclear etiology hemoglobin 8.5 g, on admission 10.2 g.  Monitor, check anemia panel  WJX:BJYN home asa and lipitor.  Anxiety/depression/Hx of Seizure disorder: Mood  is stable continue  home Ativan 1 mg bedtime/Neurontin/hydrocodone Cont the same.  COPD: not in exacerbation.    Tobacco abuse: keep on nicotine patch  DVT prophylaxis: enoxaparin (LOVENOX) injection 40 mg Start: 10/24/19 2200 SCDs Start: 10/24/19 1820 Code Status:   Code Status: Full Code  Family Communication: plan of care discussed with patient at bedside.  Status is: Inpatient  Remains inpatient appropriate because:Ongoing diagnostic testing needed not appropriate for outpatient work up, IV treatments appropriate due to intensity of illness or inability to take PO and Inpatient level of care appropriate due to severity of illness   Dispo: The patient is from: Home              Anticipated d/c is to: Home              Anticipated d/c date is: > 3 days              Patient currently is not medically stable to d/c. Nutrition: Diet Order            Diet NPO time specified  Diet effective midnight                   Body mass index is 26.31 kg/m.  Consultants:see note  Procedures:see note Microbiology:see note Blood Culture    Component Value Date/Time   SDES  10/24/2019 1952    BLOOD BLOOD RIGHT HAND Performed at [facility], [place] [address], [place], [place] [address]    [place]  10/24/2019 1952    BOTTLES DRAWN AEROBIC AND ANAEROBIC Blood Culture adequate volume Performed at [facility] [address] [place], [place] [address]    CULT  10/24/2019 1952    NO GROWTH < 12 HOURS Performed at [facility] [address] [place], [place] [address]    REPTSTATUS PENDING 10/24/2019 1952    Other culture-see note  Medications: Scheduled Meds: . aspirin EC  81 mg Oral Daily  . atorvastatin  10 mg Oral QPM  . calcium-vitamin D  1 tablet Oral QHS  . enoxaparin (LOVENOX) injection  40 mg Subcutaneous Q24H  . loratadine  10 mg Oral Daily  . multivitamin with minerals  1 tablet Oral QHS  . nicotine  7 mg Transdermal Daily   Continuous Infusions: . lactated ringers    . lactated ringers 125 mL/hr at 10/25/19 0513  .  piperacillin-tazobactam (ZOSYN)  IV 3.375 g (10/25/19 0512)  . vancomycin      Antimicrobials: Anti-infectives (From admission, onward)   Start     Dose/Rate Route Frequency Ordered Stop   10/25/19 1000  vancomycin (VANCOCIN) IVPB 1000 mg/200 mL premix        1,000 mg 200 mL/hr over 60 Minutes Intravenous Every 12 hours 10/24/19 1835     10/24/19 2300  piperacillin-tazobactam (ZOSYN) IVPB 3.375 g        3.375 g 12.5 mL/hr over 240 Minutes Intravenous Every 8 hours 10/24/19 1835     10/24/19 2000  vancomycin (VANCOREADY) IVPB 1500 mg/300 mL  Status:  Discontinued        1,500 mg 150 mL/hr over 120 Minutes Intravenous  Once 10/24/19 1835 10/24/19 1836   10/24/19 2000  vancomycin (VANCOREADY) IVPB 1500 mg/300 mL        1,500 mg 150 mL/hr over 120 Minutes Intravenous  Once 10/24/19 1836 10/24/19 2229   10/24/19 1730  piperacillin-tazobactam (ZOSYN) IVPB 3.375 g  3.375 g 100 mL/hr over 30 Minutes Intravenous  Once 10/24/19 1721 10/24/19 1810     Objective: Vitals: Today's Vitals   10/25/19 0419 10/25/19 0451 10/25/19 0518 10/25/19 0724  BP:  96/67    Pulse:  72    Resp:  18    Temp:  99.4 F (37.4 C)    TempSrc:      SpO2:  96%    Weight:      Height:      PainSc: 6   Asleep 8     Intake/Output Summary (Last 24 hours) at 10/25/2019 0941 Last data filed at 10/25/2019 0200 Gross per 24 hour  Intake 518.62 ml  Output --  Net 518.62 ml   Filed Weights   10/24/19 0751  Weight: 78.5 kg   Weight change:   Intake/Output from previous day: 09/02 0701 - 09/03 0700 In: 518.6 [I.V.:494.2; IV Piggyback:24.5] Out: -  Intake/Output this shift: No intake/output data recorded. Examination: General exam: AAO ,NAD, weak appearing. HEENT:Oral mucosa moist, Ear/Nose WNL grossly,dentition normal. Respiratory system: bilaterally clear,no wheezing or crackles,no use of accessory muscle, non tender. Cardiovascular system: S1 & S2 +, regular, No JVD. Gastrointestinal system: Abdomen  soft, Tender lower abdomen mostly in suprapubic area,ND, BS+. Nervous System:Alert, awake, moving extremities and grossly nonfocal Extremities: No edema, distal peripheral pulses palpable.  Skin: No rashes,no icterus. MSK: Normal muscle bulk,tone, power  Data Reviewed: I have personally reviewed following labs and imaging studies CBC: Recent Labs  Lab 10/24/19 1154 10/24/19 1945 10/25/19 0313  WBC 12.6* 11.4* 7.5  HGB 10.2* 9.5* 8.5*  HCT 32.5* 30.3* 27.7*  MCV 102.5* 103.1* 103.7*  PLT 464* 480* 314   Basic Metabolic Panel: Recent Labs  Lab 10/24/19 1154 10/24/19 1945 10/25/19 0313  NA 136  --  140  K 3.7  --  3.7  CL 102  --  106  CO2 25  --  23  GLUCOSE 107*  --  87  BUN 8  --  8  CREATININE 0.68 0.86 0.67  CALCIUM 9.1  --  8.7*   GFR: Estimated Creatinine Clearance: 86.4 mL/min (by C-G formula based on SCr of 0.67 mg/dL). Liver Function Tests: Recent Labs  Lab 10/24/19 1154 10/25/19 0313  AST 12* 9*  ALT 9 8  ALKPHOS 92 74  BILITOT 0.6 0.7  PROT 7.3 5.9*  ALBUMIN 3.4* 2.8*   No results for input(s): LIPASE, AMYLASE in the last 168 hours. No results for input(s): AMMONIA in the last 168 hours. Coagulation Profile: Recent Labs  Lab 10/25/19 0313  INR 1.0   Cardiac Enzymes: No results for input(s): CKTOTAL, CKMB, CKMBINDEX, TROPONINI in the last 168 hours. BNP (last 3 results) No results for input(s): PROBNP in the last 8760 hours. HbA1C: No results for input(s): HGBA1C in the last 72 hours. CBG: No results for input(s): GLUCAP in the last 168 hours. Lipid Profile: No results for input(s): CHOL, HDL, LDLCALC, TRIG, CHOLHDL, LDLDIRECT in the last 72 hours. Thyroid Function Tests: No results for input(s): TSH, T4TOTAL, FREET4, T3FREE, THYROIDAB in the last 72 hours. Anemia Panel: No results for input(s): VITAMINB12, FOLATE, FERRITIN, TIBC, IRON, RETICCTPCT in the last 72 hours. Sepsis Labs: Recent Labs  Lab 10/24/19 1620 10/24/19 1945  10/25/19 0313  PROCALCITON  --   --  <0.10  LATICACIDVEN 1.7 1.1  --     Recent Results (from the past 240 hour(s))  SARS Coronavirus 2 by RT PCR (hospital order, performed in [facility] hospital lab)  Nasopharyngeal Nasopharyngeal Swab     Status: None   Collection Time: 10/24/19  3:48 PM   Specimen: Nasopharyngeal Swab  Result Value Ref Range Status   SARS Coronavirus 2 NEGATIVE NEGATIVE Final    Comment: (NOTE) SARS-CoV-2 target nucleic acids are NOT DETECTED.  The SARS-CoV-2 RNA is generally detectable in upper and lower respiratory specimens during the acute phase of infection. The lowest concentration of SARS-CoV-2 viral copies this assay can detect is 250 copies / mL. A negative result does not preclude SARS-CoV-2 infection and should not be used as the sole basis for treatment or other patient management decisions.  A negative result may occur with improper specimen collection / handling, submission of specimen other than nasopharyngeal swab, presence of viral mutation(s) within the areas targeted by this assay, and inadequate number of viral copies (<250 copies / mL). A negative result must be combined with clinical observations, patient history, and epidemiological information.  Fact Sheet for Patients:   StrictlyIdeas.no  Fact Sheet for Healthcare Providers: BankingDealers.co.za  This test is not yet approved or  cleared by the [place] FDA and has been authorized for detection and/or diagnosis of SARS-CoV-2 by FDA under an Emergency Use Authorization (EUA).  This EUA will remain in effect (meaning this test can be used) for the duration of the COVID-19 declaration under Section 564(b)(1) of the Act, 21 U.S.C. section 360bbb-3(b)(1), unless the authorization is terminated or revoked sooner.  Performed at [facility], [place] [address], [place], [place] [address]   Culture, blood (routine x 2)      Status: None (Preliminary result)   Collection Time: 10/24/19  7:46 PM   Specimen: BLOOD  Result Value Ref Range Status   Specimen Description   Final    BLOOD RIGHT ANTECUBITAL Performed at [facility] [address] [place], [place] [address]    Special Requests   Final    BOTTLES DRAWN AEROBIC AND ANAEROBIC Blood Culture adequate volume Performed at [facility] [address] [place], [place] [address]    Culture   Final    NO GROWTH < 12 HOURS Performed at [facility] [address], [place], [place] [address]    Report Status PENDING  Incomplete  Culture, blood (routine x 2)     Status: None (Preliminary result)   Collection Time: 10/24/19  7:52 PM   Specimen: BLOOD  Result Value Ref Range Status   Specimen Description   Final    BLOOD BLOOD RIGHT HAND Performed at [facility] [address], [place], [place] [address]    Special Requests   Final    BOTTLES DRAWN AEROBIC AND ANAEROBIC Blood Culture adequate volume Performed at [facility] [address], [place], [place] [address]    Culture   Final    NO GROWTH < 12 HOURS Performed at [facility] [address], [place], [place] [address]    Report Status PENDING  Incomplete  Surgical PCR screen     Status: None   Collection Time: 10/25/19 12:14 AM   Specimen: Nasal Mucosa; Nasal Swab  Result Value Ref Range Status   MRSA, PCR NEGATIVE NEGATIVE Final   Staphylococcus aureus NEGATIVE NEGATIVE Final    Comment: (NOTE) The Xpert SA Assay (FDA approved for NASAL specimens in patients 60 years of age and older), is one component of a comprehensive surveillance program. It is not intended to diagnose infection nor  to guide or monitor treatment. Performed at [facility], [place] [address] [place], [place] [address]      Radiology Studies: CT Abdomen Pelvis W Contrast  Addendum Date: 10/23/2019    ADDENDUM REPORT: 10/23/2019 17:36 ADDENDUM: For clarification, the central gas and fluid containing collection the pelvis is an abscess within the central portion of a complex fistulous network. Colonic thickening and commented on in the initial report and remains evident likely due to inflammation. By report, according to the referring provider, the patient may have a rectal mass. Follow-up colonoscopy is suggested for complete evaluation to exclude the possibility of concurrent neoplasm as outlined in the initial report. These results were discussed by telephone at the time of interpretation on 10/23/2019 at 5:05 pm to provider [facility] , who verbally acknowledged these results. Electronically Signed   By: Zetta Bills M.D.   On: 10/23/2019 17:36   Result Date: 10/23/2019 CLINICAL DATA:  Abdominal abscess/infection suspected. EXAM: CT ABDOMEN AND PELVIS WITH CONTRAST TECHNIQUE: Multidetector CT imaging of the abdomen and pelvis was performed using the standard protocol following bolus administration of intravenous contrast. CONTRAST:  119m OMNIPAQUE IOHEXOL 300 MG/ML  SOLN COMPARISON:  Prior study of October 09, 2019 FINDINGS: Lower chest: Lung bases are clear.  No effusion.  No consolidation. Hepatobiliary: Lobular hepatic contours, no suspicious lesion. Portal vein is patent. Hepatic veins are patent. No pericholecystic stranding. Pancreas: Pancreas is normal without ductal dilation or inflammation. Spleen: Spleen normal in size and contour. Adrenals/Urinary Tract: Adrenal glands are normal. Symmetric renal enhancement. No hydronephrosis. Urinary bladder with adjacent stranding which is mainly associated with the uterus, see below. Stomach/Bowel: Stomach unremarkable by CT. No acute small bowel process. Marked thickening of the appendix which extends into a gas in fluid collection in the pelvis which is bounded posteriorly and LEFT antral laterally by the sigmoid colon with very similar appearance  when compared to the previous study. This gas and fluid containing collection measures 3.5 x 3.0 cm as compared to 3.4 x 3.5 cm. Gas still tracks towards the wall and potentially into the wall of the sigmoid. RIGHT adnexal process measuring 3.2 x 2.5 cm previously 2.9 x 2.4 cm. Extensive stranding throughout the pelvis through fascial planes tracking towards the sacral promontory. Vascular/Lymphatic: Calcified noncalcified atheromatous plaque of the abdominal aorta. No aneurysmal dilation. No adenopathy with numerous scattered small lymph nodes throughout the retroperitoneum presumably reactive. Scattered pelvic lymph nodes also likely reactive. Reproductive: Uterus and upper vagina in very close proximity to the pelvic fluid collection and fistulous network. Enhancing tract extends towards the vaginal apex best seen on sagittal images, sagittal image 55, behind the inflamed rectosigmoid there is a crescent-shaped tract with enhancement that extends towards the vaginal apex. Gas present in the vagina as well. RIGHT adnexal process with fluid collection suspicious for abscess either adjacent to or involving the RIGHT ovary similar to the prior exam. Stranding throughout mesorectal fascial planes of the upper mesorectum and along the RIGHT and LEFT pelvic sidewall with similar appearance. Other: No free intraperitoneal air outside of the pelvis and the loculated collection at the center of the fistulous network. Musculoskeletal: No acute musculoskeletal process. No destructive bone finding. IMPRESSION: 1. 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 This portion of the findings is essentially unchanged compared to the prior study. 2. Enlarging cystic, fluid containing area in the RIGHT adnexa  may represent worsening of abscess adjacent to or involving  the ovary. Difficult to determine definitively on the current study, reactive ovarian cyst is felt less likely though remains a differential consideration. There is no gas within this area currently. 3. Aortic atherosclerosis. These results will be called to the ordering clinician or representative by the Radiologist Assistant, and communication documented in the PACS or [employer]. Aortic Atherosclerosis (ICD10-I70.0). Electronically Signed: By: Zetta Bills M.D. On: 10/23/2019 15:42     LOS: 1 day   Antonieta Pert, MD [facility]  10/25/2019, 9:41 AM

## 2019-10-25 NOTE — Consult Note (Signed)
Reason for Consult: abdominal pain Referring Physician: Ileene MusaRamesh Kc  Susan Todd is an 54 y.o. female.  HPI: 54 yo female with 1 month history of colovaginal fistula. She saw Dr. Maisie Fushomas in the office and plan was to further investigate with colonoscopy. She had a CT showing active inflammation and was treated with outpatient antibiotics without improvement. Repeat imaging showed persistent abscess and she was sent to the ED for further care. She had some RLQ pain yesterday but today most pain is in the suprapubic area. This pain has been persistent for over a week. It is constant and intense. She feels hungry and is in a hurry to have this problem completed so she can work.  Past Medical History:  Diagnosis Date  . Alcoholism (HCC)   . Anxiety   . Arthritis   . Atrial fibrillation (HCC)    "pt. has no knowledge of this" as of 03-07-14  . Carotid artery occlusion   . COPD (chronic obstructive pulmonary disease) (HCC)    Emphysema  . Depression   . Diverticulosis   . Hypercholesterolemia   . Hypertension   . Seizure disorder (HCC)   . Vaginal candidiasis     Past Surgical History:  Procedure Laterality Date  . BUNIONECTOMY Bilateral   . CYST EXCISION PERINEAL     8'03- Dr. Elana AlmMcPhail    Family History  Problem Relation Age of Onset  . Heart disease Mother        NOT  before age 760  . Hyperlipidemia Mother   . Dementia Mother   . Hypertension Mother   . Depression Mother   . COPD Mother   . Other Mother        Covid 19-deceased from  . Hyperlipidemia Father   . Hypertension Father   . Colon polyps Father   . Melanoma Father   . Hyperlipidemia Brother   . Hypertension Brother   . Heart disease Brother        Arhymia  . Alcohol abuse Brother   . Depression Brother   . Melanoma Brother   . Heart disease Maternal Aunt   . Alcoholism Son     Social History:  reports that she has been smoking cigarettes. She has a 30.00 pack-year smoking history. She has never used  smokeless tobacco. She reports previous alcohol use. She reports that she does not use drugs.  Allergies:  Allergies  Allergen Reactions  . Codeine Nausea And Vomiting  . Latex Hives  . Metronidazole Diarrhea    Patient is not able to take tablet form.    Medications: I have reviewed the patient's current medications.  Results for orders placed or performed during the hospital encounter of 10/24/19 (from the past 48 hour(s))  Comprehensive metabolic panel     Status: Abnormal   Collection Time: 10/24/19 11:54 AM  Result Value Ref Range   Sodium 136 135 - 145 mmol/L   Potassium 3.7 3.5 - 5.1 mmol/L   Chloride 102 98 - 111 mmol/L   CO2 25 22 - 32 mmol/L   Glucose, Bld 107 (H) 70 - 99 mg/dL    Comment: Glucose reference range applies only to samples taken after fasting for at least 8 hours.   BUN 8 6 - 20 mg/dL   Creatinine, Ser 4.400.68 0.44 - 1.00 mg/dL   Calcium 9.1 8.9 - 10.210.3 mg/dL   Total Protein 7.3 6.5 - 8.1 g/dL   Albumin 3.4 (L) 3.5 - 5.0 g/dL   AST  12 (L) 15 - 41 U/L   ALT 9 0 - 44 U/L   Alkaline Phosphatase 92 38 - 126 U/L   Total Bilirubin 0.6 0.3 - 1.2 mg/dL   GFR calc non Af Amer >60 >60 mL/min   GFR calc Af Amer >60 >60 mL/min   Anion gap 9 5 - 15    Comment: Performed at [facility], [address] [place], [place] [address]  CBC     Status: Abnormal   Collection Time: 10/24/19 11:54 AM  Result Value Ref Range   WBC 12.6 (H) 4.0 - 10.5 K/uL   RBC 3.17 (L) 3.87 - 5.11 MIL/uL   Hemoglobin 10.2 (L) 12.0 - 15.0 g/dL   HCT 51.8 (L) 36 - 46 %   MCV 102.5 (H) 80.0 - 100.0 fL   MCH 32.2 26.0 - 34.0 pg   MCHC 31.4 30.0 - 36.0 g/dL   RDW 33.5 (H) 82.5 - 18.9 %   Platelets 464 (H) 150 - 400 K/uL   nRBC 0.0 0.0 - 0.2 %    Comment: Performed at [facility], [address] [place], [place] [address]  SARS Coronavirus 2 by RT PCR (hospital order, performed in [facility] hospital lab) Nasopharyngeal Nasopharyngeal Swab     Status:  None   Collection Time: 10/24/19  3:48 PM   Specimen: Nasopharyngeal Swab  Result Value Ref Range   SARS Coronavirus 2 NEGATIVE NEGATIVE    Comment: (NOTE) SARS-CoV-2 target nucleic acids are NOT DETECTED.  The SARS-CoV-2 RNA is generally detectable in upper and lower respiratory specimens during the acute phase of infection. The lowest concentration of SARS-CoV-2 viral copies this assay can detect is 250 copies / mL. A negative result does not preclude SARS-CoV-2 infection and should not be used as the sole basis for treatment or other patient management decisions.  A negative result may occur with improper specimen collection / handling, submission of specimen other than nasopharyngeal swab, presence of viral mutation(s) within the areas targeted by this assay, and inadequate number of viral copies (<250 copies / mL). A negative result must be combined with clinical observations, patient history, and epidemiological information.  Fact Sheet for Patients:   BoilerBrush.com.cy  Fact Sheet for Healthcare Providers: https://pope.com/  This test is not yet approved or  cleared by the [place] FDA and has been authorized for detection and/or diagnosis of SARS-CoV-2 by FDA under an Emergency Use Authorization (EUA).  This EUA will remain in effect (meaning this test can be used) for the duration of the COVID-19 declaration under Section 564(b)(1) of the Act, 21 U.S.C. section 360bbb-3(b)(1), unless the authorization is terminated or revoked sooner.  Performed at [facility], [address] [place], [place] [address]   Lactic acid, plasma     Status: None   Collection Time: 10/24/19  4:20 PM  Result Value Ref Range   Lactic Acid, Venous 1.7 0.5 - 1.9 mmol/L    Comment: Performed at [facility], [address], [place], [place] [address]  Urinalysis, Routine w reflex microscopic     Status:  Abnormal   Collection Time: 10/24/19  6:22 PM  Result Value Ref Range   Color, Urine YELLOW YELLOW   APPearance TURBID (A) CLEAR   Specific Gravity, Urine 1.013 1.005 - 1.030   pH 5.0 5.0 - 8.0   Glucose, UA NEGATIVE NEGATIVE mg/dL   Hgb urine dipstick MODERATE (A) NEGATIVE   Bilirubin Urine NEGATIVE  NEGATIVE   Ketones, ur 5 (A) NEGATIVE mg/dL   Protein, ur 401 (A) NEGATIVE mg/dL   Nitrite NEGATIVE NEGATIVE   Leukocytes,Ua LARGE (A) NEGATIVE   RBC / HPF 21-50 0 - 5 RBC/hpf   WBC, UA >50 (H) 0 - 5 WBC/hpf   Bacteria, UA RARE (A) NONE SEEN   Squamous Epithelial / LPF 0-5 0 - 5   WBC Clumps PRESENT    Mucus PRESENT    Ca Oxalate Crys, UA PRESENT     Comment: Performed at [facility], [address] [place], [place] [address]  Lactic acid, plasma     Status: None   Collection Time: 10/24/19  7:45 PM  Result Value Ref Range   Lactic Acid, Venous 1.1 0.5 - 1.9 mmol/L    Comment: Performed at [facility], [address] [place], [place] [address]  CBC     Status: Abnormal   Collection Time: 10/24/19  7:45 PM  Result Value Ref Range   WBC 11.4 (H) 4.0 - 10.5 K/uL   RBC 2.94 (L) 3.87 - 5.11 MIL/uL   Hemoglobin 9.5 (L) 12.0 - 15.0 g/dL   HCT 03.4 (L) 36 - 46 %   MCV 103.1 (H) 80.0 - 100.0 fL   MCH 32.3 26.0 - 34.0 pg   MCHC 31.4 30.0 - 36.0 g/dL   RDW 74.2 (H) 59.5 - 63.8 %   Platelets 480 (H) 150 - 400 K/uL   nRBC 0 0.0 - 0.2 %    Comment: Performed at [facility], [address], [place], [place] [address]  Creatinine, serum     Status: None   Collection Time: 10/24/19  7:45 PM  Result Value Ref Range   Creatinine, Ser 0.86 0.44 - 1.00 mg/dL   GFR calc non Af Amer >60 >60 mL/min   GFR calc Af Amer >60 >60 mL/min    Comment: Performed at [facility], [address] [place], [place] [address]  Surgical PCR screen     Status: None   Collection Time: 10/25/19 12:14 AM   Specimen: Nasal Mucosa;  Nasal Swab  Result Value Ref Range   MRSA, PCR NEGATIVE NEGATIVE   Staphylococcus aureus NEGATIVE NEGATIVE    Comment: (NOTE) The Xpert SA Assay (FDA approved for NASAL specimens in patients 53 years of age and older), is one component of a comprehensive surveillance program. It is not intended to diagnose infection nor to guide or monitor treatment. Performed at [facility], [address] [place], [place] [address]   Protime-INR     Status: None   Collection Time: 10/25/19  3:13 AM  Result Value Ref Range   Prothrombin Time 12.8 11.4 - 15.2 seconds   INR 1.0 0.8 - 1.2    Comment: (NOTE) INR goal varies based on device and disease states. Performed at [facility], [address] [place], [place] [address]   Procalcitonin     Status: None   Collection Time: 10/25/19  3:13 AM  Result Value Ref Range   Procalcitonin <0.10 ng/mL    Comment:        Interpretation: PCT (Procalcitonin) <= 0.5 ng/mL: Systemic infection (sepsis) is not likely. Local bacterial infection is possible. (NOTE)       Sepsis PCT Algorithm           Lower Respiratory Tract  Infection PCT Algorithm    ----------------------------     ----------------------------         PCT < 0.25 ng/mL                PCT < 0.10 ng/mL          Strongly encourage             Strongly discourage   discontinuation of antibiotics    initiation of antibiotics    ----------------------------     -----------------------------       PCT 0.25 - 0.50 ng/mL            PCT 0.10 - 0.25 ng/mL               OR       >80% decrease in PCT            Discourage initiation of                                            antibiotics      Encourage discontinuation           of antibiotics    ----------------------------     -----------------------------         PCT >= 0.50 ng/mL              PCT 0.26 - 0.50 ng/mL               AND        <80% decrease in PCT              Encourage initiation of                                             antibiotics       Encourage continuation           of antibiotics    ----------------------------     -----------------------------        PCT >= 0.50 ng/mL                  PCT > 0.50 ng/mL               AND         increase in PCT                  Strongly encourage                                      initiation of antibiotics    Strongly encourage escalation           of antibiotics                                     -----------------------------                                           PCT <= 0.25 ng/mL  OR                                        > 80% decrease in PCT                                      Discontinue / Do not initiate                                             antibiotics  Performed at [facility], [address] [place], [place] [address]   Comprehensive metabolic panel     Status: Abnormal   Collection Time: 10/25/19  3:13 AM  Result Value Ref Range   Sodium 140 135 - 145 mmol/L   Potassium 3.7 3.5 - 5.1 mmol/L   Chloride 106 98 - 111 mmol/L   CO2 23 22 - 32 mmol/L   Glucose, Bld 87 70 - 99 mg/dL    Comment: Glucose reference range applies only to samples taken after fasting for at least 8 hours.   BUN 8 6 - 20 mg/dL   Creatinine, Ser 8.31 0.44 - 1.00 mg/dL   Calcium 8.7 (L) 8.9 - 10.3 mg/dL   Total Protein 5.9 (L) 6.5 - 8.1 g/dL   Albumin 2.8 (L) 3.5 - 5.0 g/dL   AST 9 (L) 15 - 41 U/L   ALT 8 0 - 44 U/L   Alkaline Phosphatase 74 38 - 126 U/L   Total Bilirubin 0.7 0.3 - 1.2 mg/dL   GFR calc non Af Amer >60 >60 mL/min   GFR calc Af Amer >60 >60 mL/min   Anion gap 11 5 - 15    Comment: Performed at [facility], [address] [place], [place] [address]  CBC     Status: Abnormal   Collection Time: 10/25/19  3:13 AM  Result Value Ref Range   WBC 7.5 4.0 - 10.5 K/uL   RBC 2.67 (L) 3.87 -  5.11 MIL/uL   Hemoglobin 8.5 (L) 12.0 - 15.0 g/dL   HCT 60.7 (L) 36 - 46 %   MCV 103.7 (H) 80.0 - 100.0 fL   MCH 31.8 26.0 - 34.0 pg   MCHC 30.7 30.0 - 36.0 g/dL   RDW 37.1 (H) 06.2 - 69.4 %   Platelets 341 150 - 400 K/uL   nRBC 0.0 0.0 - 0.2 %    Comment: Performed at [facility], [address], [place], [place] [address]    CT Abdomen Pelvis W Contrast  Addendum Date: 10/23/2019   ADDENDUM REPORT: 10/23/2019 17:36 ADDENDUM: For clarification, the central gas and fluid containing collection the pelvis is an abscess within the central portion of a complex fistulous network. Colonic thickening and commented on in the initial report and remains evident likely due to inflammation. By report, according to the referring provider, the patient may have a rectal mass. Follow-up colonoscopy is suggested for complete evaluation to exclude the possibility of concurrent neoplasm as outlined in the initial report. These results were discussed by telephone at the time of interpretation on 10/23/2019 at 5:05 pm to provider [facility] , who verbally acknowledged these results. Electronically Signed  By: Donzetta Kohut M.D.   On: 10/23/2019 17:36   Result Date: 10/23/2019 CLINICAL DATA:  Abdominal abscess/infection suspected. EXAM: CT ABDOMEN AND PELVIS WITH CONTRAST TECHNIQUE: Multidetector CT imaging of the abdomen and pelvis was performed using the standard protocol following bolus administration of intravenous contrast. CONTRAST:  OMNIPAQUE IOHEXOL 300 MG/ML  SOLN COMPARISON:  Prior study of October 09, 2019 FINDINGS: Lower chest: Lung bases are clear.  No effusion.  No consolidation. Hepatobiliary: Lobular hepatic contours, no suspicious lesion. Portal vein is patent. Hepatic veins are patent. No pericholecystic stranding. Pancreas: Pancreas is normal without ductal dilation or inflammation. Spleen: Spleen normal in size and contour. Adrenals/Urinary Tract: Adrenal glands are  normal. Symmetric renal enhancement. No hydronephrosis. Urinary bladder with adjacent stranding which is mainly associated with the uterus, see below. Stomach/Bowel: Stomach unremarkable by CT. No acute small bowel process. Marked thickening of the appendix which extends into a gas in fluid collection in the pelvis which is bounded posteriorly and LEFT antral laterally by the sigmoid colon with very similar appearance when compared to the previous study. This gas and fluid containing collection measures 3.5 x 3.0 cm as compared to 3.4 x 3.5 cm. Gas still tracks towards the wall and potentially into the wall of the sigmoid. RIGHT adnexal process measuring 3.2 x 2.5 cm previously 2.9 x 2.4 cm. Extensive stranding throughout the pelvis through fascial planes tracking towards the sacral promontory. Vascular/Lymphatic: Calcified noncalcified atheromatous plaque of the abdominal aorta. No aneurysmal dilation. No adenopathy with numerous scattered small lymph nodes throughout the retroperitoneum presumably reactive. Scattered pelvic lymph nodes also likely reactive. Reproductive: Uterus and upper vagina in very close proximity to the pelvic fluid collection and fistulous network. Enhancing tract extends towards the vaginal apex best seen on sagittal images, sagittal image 55, behind the inflamed rectosigmoid there is a crescent-shaped tract with enhancement that extends towards the vaginal apex. Gas present in the vagina as well. RIGHT adnexal process with fluid collection suspicious for abscess either adjacent to or involving the RIGHT ovary similar to the prior exam. Stranding throughout mesorectal fascial planes of the upper mesorectum and along the RIGHT and LEFT pelvic sidewall with similar appearance. Other: No free intraperitoneal air outside of the pelvis and the loculated collection at the center of the fistulous network. Musculoskeletal: No acute musculoskeletal process. No destructive bone finding. IMPRESSION:  1. 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 This portion of the findings is essentially unchanged compared to the prior study. 2. Enlarging cystic, fluid containing area in the RIGHT adnexa may represent worsening of abscess adjacent to or involving the ovary. Difficult to determine definitively on the current study, reactive ovarian cyst is felt less likely though remains a differential consideration. There is no gas within this area currently. 3. Aortic atherosclerosis. These results will be called to the ordering clinician or representative by the Radiologist Assistant, and communication documented in the PACS or [employer]. Aortic Atherosclerosis (ICD10-I70.0). Electronically Signed: By: Donzetta Kohut M.D. On: 10/23/2019 15:42    Review of Systems  Constitutional: Negative for chills and fever.  HENT: Negative for hearing loss.   Eyes: Negative for blurred vision and double vision.  Respiratory: Negative for cough and hemoptysis.   Cardiovascular: Negative for chest pain and palpitations.  Gastrointestinal: Positive for abdominal pain and nausea. Negative for vomiting.  Genitourinary: Negative for dysuria and urgency.  Musculoskeletal:  Negative for myalgias and neck pain.  Skin: Negative for itching and rash.  Neurological: Negative for dizziness, tingling and headaches.  Endo/Heme/Allergies: Does not bruise/bleed easily.  Psychiatric/Behavioral: Negative for depression and suicidal ideas.    PE Blood pressure 96/67, pulse 72, temperature 99.4 F (37.4 C), resp. rate 18, height  (1.727 m), weight 78.5 kg, last menstrual period 11/20/2012, SpO2 96 %. Constitutional: NAD; conversant; no deformities Eyes: Moist conjunctiva; no lid lag; anicteric; PERRL Neck: Trachea midline; no thyromegaly Lungs: Normal  respiratory effort; no tactile fremitus CV: RRR; no palpable thrills; no pitting edema GI: Abd tender in lower abdomen, moderate distension; no palpable hepatosplenomegaly MSK: Normal gait; no clubbing/cyanosis Psychiatric: Appropriate affect; alert and oriented x3 Lymphatic: No palpable cervical or axillary lymphadenopathy   Assessment/Plan: 54 yo female with lower abdominal pain initially presumed to be diverticulitis. She had 2 CT scans showing small pelvic abscesses concerning for diverticulitis vs appendicitis. She did not respond to outpatient management and was admitted for further care.  -If the primary process was appendicitis that is now perforated appendicitis the treatment would be IR drainage if possible, bowel rest and long antibiotics with possible interval appendectomy in the 8-12 week time period. Appendicitis would not explain the colovaginal fistula starting 2 months ago so at some point she has an additional colon inflammatory process  -If this is diverticulitis leading to colovaginal fistula and then to localized perforation with small abscesses that have not responded to antibiotics, the treatment would be bowel rest, broad IV antibiotics, and possible IR drainage. If she failed to respond to that treatment the secondary treatment would be fecal diversion with ostomy. There is still a question of mass from the rectal exam in the office so ideally colonoscopy would be performed prior to definitive resection and given the abscesses, length of acute inflamation and known colovaginal fistula an acute resection would have a high morbidity rate  De Blanch Jock Mahon 10/25/2019, 8:06 AM

## 2019-10-25 NOTE — Progress Notes (Signed)
Patient ID: Susan Todd, female   DOB: [birth year], 54 y.o.   MRN: [id number] Request received for possible CT-guided abscess drainage in patient.  All previous studies have been reviewed by Dr. Miles Costain.  There is currently no window for percutaneous access-drainage of abscess.  Please contact Dr.Shick at [phone] or pager [phone] with any additional questions.

## 2019-10-25 NOTE — TOC Initial Note (Addendum)
Transition of Care [facility]) - Initial/Assessment Note    Patient Details  Name: Susan Todd MRN: [id number] Date of Birth: 10-30-1965  Transition of Care [facility]) CM/SW Contact:    Lia Hopping, [place] Phone Number: 10/25/2019, 9:54 AM  Clinical Narrative:                 Re: Medication assistance CSW met with patient at bedside to barriers to afford her medications. Patient reports she lost her job and insurance benefits during the pandemic. The patient has not been able to purchase her medications or make an appointment with her primary care doctor. CSW educated the patient on the [facility] and wellness clinic/Medication Assistance for qualified patients Patient express interest in the clinic and gave [facility] staff permission to schedule a hospital follow up appointment closer to discharge.  Patient will need medication assistance for new discharge medications [facility] letter).  TOC staff will continue to follow this patient.   Expected Discharge Plan: Home/Self Care Barriers to Discharge: Continued Medical Work up   Patient Goals and CMS Choice Patient states their goals for this hospitalization and ongoing recovery are:: get well CMS Medicare.gov Compare Post Acute Care list provided to:: Patient Choice offered to / list presented to : Patient  Expected Discharge Plan and Services Expected Discharge Plan: Home/Self Care In-house Referral: Clinical Social Work Discharge Planning Services: [facility], Medication Assistance   Living arrangements for the past 2 months: Apartment                 DME Arranged: N/A DME Agency: NA       HH Arranged: NA [place] Agency: NA        Prior Living Arrangements/Services Living arrangements for the past 2 months: Apartment Lives with:: Self Patient language and need for interpreter reviewed:: No Do you feel safe going back to the place where you live?: Yes      Need for Family Participation in Patient Care: Yes  (Comment) Care giver support system in place?: Yes (comment)   Criminal Activity/Legal Involvement Pertinent to Current Situation/Hospitalization: No - Comment as needed  Activities of Daily Living Home Assistive Devices/Equipment: Eyeglasses, Other (Comment) (heating pad) ADL Screening (condition at time of admission) Patient's cognitive ability adequate to safely complete daily activities?: Yes Is the patient deaf or have difficulty hearing?: No Does the patient have difficulty seeing, even when wearing glasses/contacts?: No Does the patient have difficulty concentrating, remembering, or making decisions?: No Patient able to express need for assistance with ADLs?: Yes Does the patient have difficulty dressing or bathing?: No Independently performs ADLs?: Yes (appropriate for developmental age) Does the patient have difficulty walking or climbing stairs?: No Weakness of Legs: Both Weakness of Arms/Hands: Both  Permission Sought/Granted Permission sought to share information with : Case Manager Permission granted to share information with : Yes, Verbal Permission Granted     Permission granted to share info w AGENCY: [facility]        Emotional Assessment Appearance:: Appears stated age Attitude/Demeanor/Rapport: Engaged Affect (typically observed): Accepting Orientation: : Oriented to Self, Oriented to Place, Oriented to  Time, Oriented to Situation Alcohol / Substance Use: Not Applicable Psych Involvement: No (comment)  Admission diagnosis:  Abdominopelvic abscess ([place]) [K65.1] Intra-abdominal abscess ([place]) [K65.1] Patient Active Problem List   Diagnosis Date Noted  . Intra-abdominal abscess ([place]) 10/24/2019  . Alcoholic ketoacidosis 99/83/3825  . Hyponatremia 08/13/2018  . Hypokalemia 08/13/2018  . Hyponatremia with normal  extracellular fluid volume 08/10/2018  . Seizure ([place]) 11/04/2016  . Seizures ([place]) 11/03/2016  . COPD (chronic obstructive  pulmonary disease) ([place]) 11/03/2016  . Alcohol abuse 11/03/2016  . Cocaine use 11/03/2016  . Shortness of breath-with exertion 09/23/2013  . Occlusion and stenosis of carotid artery without mention of cerebral infarction 08/22/2011  . Depression   . Anxiety   . Arthritis    PCP:  Wenda Low, MD Pharmacy:   [facility], [place] [address] Phone: [phone] Fax: [phone]     Social Determinants of Health (SDOH) Interventions    Readmission Risk Interventions No flowsheet data found.

## 2019-10-25 NOTE — Consult Note (Signed)
Consultation  Referring Provider:  [facility]/ Ramesh Primary Care Physician:  Georgann Housekeeper, MD Primary Gastroenterologist:  Dr.Armbruster  Reason for Consultation: Severe lower abdominal pain, pelvic abscess  HPI: Susan Todd is a 54 y.o. female, new to GI last week when seen in clinic on 10/17/2019, in referral from Dr. Romie Levee.  Patient had given a 6-month history of vaginal discharge, and progressive lower abdominal pain over the past month 6-8 weeks. She reports purulent/feculent appearing vaginal discharge .She had undergone CT imaging on 10/09/2019 with finding of a complex fistulous network consistent with perforated appendicitis or diverticulitis with sequelae.  There was abnormal enhancement of the appendix which extended into the pelvis where it was lost with any gas and fluid collection.  There was a tract for this collection extending to the right lateral side of the sigmoid colon.  Also noted a 2.9 x 2.4 cm fluid collection in the right adnexa and 2.0 x 1.2 cm fluid collection along the right sigmoid.   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 Admitted last night, started on IV Zosyn and has been evaluated by surgery who felt she should be considered for IR drainage. IR has evaluated this morning, and unfortunately no window for percutaneous access for drainage of the abscess.  There was concern at the time of a GYN evaluation on 10/02/2019 at which time she was having endometrial biopsy that there was a firm mass-effect in the posterior cul-de-sac.  When examined by Dr. Maisie Fus, felt to have an intrinsic mass  palpated within the rectum about 8 cm from the anal verge.  Colonoscopy felt to be indicated eventually for evaluation of potential rectal mass.  Labs on admit yesterday, WBC 11.4, hemoglobin 9.5   lactate 1.1  Procalcitonin less than 0.1 Albumin 2.8 LFTs within normal limits   Past Medical History:  Diagnosis Date  . Alcoholism (HCC)   . Anxiety   . Arthritis   . Atrial fibrillation (HCC)    "pt. has no knowledge of this" as of 03-07-14  . Carotid artery occlusion   . COPD (chronic obstructive pulmonary disease) (HCC)    Emphysema  . Depression   . Diverticulosis   . Hypercholesterolemia   . Hypertension   . Seizure disorder (HCC)   . Vaginal candidiasis     Past Surgical History:  Procedure Laterality Date  . BUNIONECTOMY Bilateral   . CYST EXCISION PERINEAL     8'03- Dr. Elana Alm    Prior to Admission medications   Medication Sig Start Date End Date Taking? Authorizing Provider  acetaminophen (TYLENOL) 500 MG tablet Take 500 mg by mouth every 6 (six) hours as needed for mild pain or headache.    Yes [provider]  albuterol (VENTOLIN HFA) 108 (90 Base) MCG/ACT inhaler Inhale 2 puffs into the lungs every 6 (six) hours as needed for wheezing or shortness of breath.  07/25/19  Yes [provider]  amoxicillin-clavulanate (AUGMENTIN) 875-125 MG tablet Take 1 tablet by mouth 2 (two) times daily for 14 days. 10/17/19 10/31/19 Yes Unk Lightning, PA  aspirin EC 81 MG tablet Take 81 mg by mouth daily.   Yes [provider]  atorvastatin (LIPITOR) 10 MG tablet Take 10 mg by mouth every evening.    Yes [provider]  calcium-vitamin D (OSCAL WITH D) 500-200 MG-UNIT TABS tablet Take 1 tablet by mouth at bedtime.   Yes [provider]  cetirizine (ZYRTEC) 5 MG tablet Take 5 mg by mouth at bedtime.   Yes [provider]  gabapentin (NEURONTIN) 100 MG capsule Take 100 mg by mouth at bedtime as needed (pain).  06/06/18  Yes  [provider]  LORazepam (ATIVAN) 1 MG tablet Take 1 tablet (1 mg total) by mouth at bedtime. Patient taking differently: Take 1 mg by mouth daily as needed for anxiety.  10/02/19  Yes Theresia Majors, MD  losartan (COZAAR) 25 MG tablet Take 25 mg by mouth daily.   Yes [provider]  metoprolol tartrate (LOPRESSOR) 25 MG tablet Take 25 mg by mouth 2 (two) times daily.   Yes [provider]  Multiple Vitamin (MULTI-VITAMIN) tablet Take 1 tablet by mouth at bedtime.    Yes [provider]  HYDROcodone-acetaminophen (NORCO/VICODIN) 5-325 MG tablet Take 1 tablet by mouth every 6 (six) hours as needed for severe pain. 10/21/19   Armbruster, Willaim Rayas, MD  ibuprofen (ADVIL) 800 MG tablet Take 1 tablet (800 mg total) by mouth every 8 (eight) hours as needed. Patient not taking: Reported on 10/24/2019 09/05/19   Theresia Majors, MD    Current Facility-Administered Medications  Medication Dose Route Frequency Provider Last Rate Last Admin  . acetaminophen (TYLENOL) tablet 650 mg  650 mg Oral Q6H PRN Kc, Ramesh, MD       Or  . acetaminophen (TYLENOL) suppository 650 mg  650 mg Rectal Q6H PRN Kc, Ramesh, MD      . albuterol (PROVENTIL) (2.5 MG/3ML) 0.083% nebulizer solution 2.5 mg  2.5 mg Nebulization Q6H PRN Danford Bad, RPH      . aspirin EC tablet 81 mg  81 mg Oral Daily Kc, Ramesh, MD   81 mg at 10/25/19 1038  . atorvastatin (LIPITOR) tablet 10 mg  10 mg Oral QPM Kc, Ramesh, MD      . calcium-vitamin D (OSCAL WITH D) 500-200 MG-UNIT per tablet 1 tablet  1 tablet Oral Grier Rocher, MD   1 tablet at 10/24/19 2204  . enoxaparin (LOVENOX) injection 40 mg  40 mg Subcutaneous Q24H Kc, Ramesh, MD   40 mg at 10/24/19 2208  . gabapentin (NEURONTIN) capsule 100 mg  100 mg Oral QHS PRN Kc, Ramesh, MD      . HYDROcodone-acetaminophen (NORCO/VICODIN) 5-325 MG per tablet 1-2 tablet  1-2 tablet Oral Q6H PRN Lanae Boast, MD   2 tablet at 10/25/19 1038  . lactated ringers  bolus 1,000 mL  1,000 mL Intravenous Once Kc, Ramesh, MD      . lactated ringers infusion   Intravenous Continuous Lanae Boast, MD 125 mL/hr at 10/25/19 0513 New Bag at 10/25/19 0513  . loratadine (CLARITIN) tablet 10 mg  10 mg Oral Daily Kc, Ramesh, MD   10 mg at 10/25/19 1039  . LORazepam (ATIVAN) tablet 1 mg  1 mg Oral Daily PRN Lanae Boast, MD   1 mg at 10/24/19 2204  . morphine 4 MG/ML injection 4 mg  4 mg Intravenous Q4H PRN Kc, Ramesh, MD      . multivitamin with minerals tablet 1 tablet  1 tablet Oral QHS Lanae Boast, MD   1 tablet at 10/24/19 2204  . nicotine (NICODERM CQ - dosed in mg/24  hr) patch 7 mg  7 mg Transdermal Daily Kc, Ramesh, MD      . ondansetron (ZOFRAN) tablet 4 mg  4 mg Oral Q6H PRN Kc, Ramesh, MD       Or  . ondansetron (ZOFRAN) injection 4 mg  4 mg Intravenous Q6H PRN Kc, Ramesh, MD      . piperacillin-tazobactam (ZOSYN) IVPB 3.375 g  3.375 g Intravenous Q8H Wofford, Drew A, RPH 12.5 mL/hr at 10/25/19 0512 3.375 g at 10/25/19 0512  . senna-docusate (Senokot-S) tablet 1 tablet  1 tablet Oral QHS PRN Lanae Boast, MD      . vancomycin (VANCOCIN) IVPB 1000 mg/200 mL premix  1,000 mg Intravenous Q12H Wofford, Drew A, RPH 200 mL/hr at 10/25/19 1040 1,000 mg at 10/25/19 1040    Allergies as of 10/24/2019 - Review Complete 10/24/2019  Allergen Reaction Noted  . Codeine Nausea And Vomiting   . Latex Hives   . Metronidazole Diarrhea 09/05/2019    Family History  Problem Relation Age of Onset  . Heart disease Mother        NOT  before age 19  . Hyperlipidemia Mother   . Dementia Mother   . Hypertension Mother   . Depression Mother   . COPD Mother   . Other Mother        Covid 19-deceased from  . Hyperlipidemia Father   . Hypertension Father   . Colon polyps Father   . Melanoma Father   . Hyperlipidemia Brother   . Hypertension Brother   . Heart disease Brother        Arhymia  . Alcohol abuse Brother   . Depression Brother   . Melanoma Brother   . Heart disease  Maternal Aunt   . Alcoholism Son     Social History   Socioeconomic History  . Marital status: Single    Spouse name: Not on file  . Number of children: 2  . Years of education: Not on file  . Highest education level: Not on file  Occupational History  . Not on file  Tobacco Use  . Smoking status: Current Every Day Smoker    Packs/day: 1.00    Years: 30.00    Pack years: 30.00    Types: Cigarettes  . Smokeless tobacco: Never Used  . Tobacco comment: pt states that she knows she needs to quit but has been stressed and uses cigs as a resort  Vaping Use  . Vaping Use: Never used  Substance and Sexual Activity  . Alcohol use: Not Currently    Comment: hx heavy alcohol use  . 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 Sexual activity: Not Currently    Comment: 1st intercourse 54 yo-More than 5 partners  Other Topics Concern  . Not on file  Social History Narrative  . Not on file   Social Determinants of Health   Financial Resource Strain:   . Difficulty of Paying Living Expenses: Not on file  Food Insecurity:   . Worried About Programme researcher, broadcasting/film/video in the Last Year: Not on file  . Ran Out of Food in the Last Year: Not on file  Transportation Needs:   . Lack of Transportation (Medical): Not on file  . Lack of Transportation (Non-Medical): Not on file  Physical Activity:   . Days of Exercise per Week: Not on file  . Minutes of Exercise per Session: Not on file  Stress:   .  Feeling of Stress : Not on file  Social Connections:   . Frequency of Communication with Friends and Family: Not on file  . Frequency of Social Gatherings with Friends and Family: Not on file  . Attends Religious Services: Not on file  . Active Member of Clubs or Organizations: Not on file  . Attends Banker Meetings: Not on file  . Marital Status: Not on file  Intimate Partner Violence:   . Fear of Current or Ex-Partner: Not on file  . Emotionally Abused: Not on file    . Physically Abused: Not on file  . Sexually Abused: Not on file    Review of Systems: Pertinent positive and negative review of systems were noted in the above HPI section.  All other review of systems was otherwise negative.  Physical Exam: Vital signs in last 24 hours: Temp:  [98.2 F (36.8 C)-99.4 F (37.4 C)] 98.2 F (36.8 C) (09/03 1007) Pulse Rate:  [65-86] 65 (09/03 1007) Resp:  [16-22] 18 (09/03 1007) BP: (91-122)/(60-77) 102/69 (09/03 1007) SpO2:  [93 %-99 %] 98 % (09/03 1007) Last BM Date: 10/23/19 General:   Alert,  Well-developed, well-nourished, older white female pleasant and cooperative in NAD Head:  Normocephalic and atraumatic. Eyes:  Sclera clear, no icterus.   Conjunctiva pink. Ears:  Normal auditory acuity. Nose:  No deformity, discharge,  or lesions. Mouth:  No deformity or lesions.   Neck:  Supple; no masses or thyromegaly. Lungs:  Clear throughout to auscultation.   No wheezes, crackles, or rhonchi. Heart:  Regular rate and rhythm; no murmurs, clicks, rubs,  or gallops. Abdomen:  Soft, tender across the lower abdomen, more marked in the right lower quadrant and suprapubic area some guarding, no rebound BS active,nonpalp mass or hsm.   Rectal:  Deferred  Msk:  Symmetrical without gross deformities. . Pulses:  Normal pulses noted. Extremities:  Without clubbing or edema. Neurologic:  Alert and  oriented x4;  grossly normal neurologically. Skin:  Intact without significant lesions or rashes.. Psych:  Alert and cooperative. Normal mood and affect.  Intake/Output from previous day: 09/02 0701 - 09/03 0700 In: 518.6 [I.V.:494.2; IV Piggyback:24.5] Out: -  Intake/Output this shift: No intake/output data recorded.  Lab Results: Recent Labs    10/24/19 1154 10/24/19 1945 10/25/19 0313  WBC 12.6* 11.4* 7.5  HGB 10.2* 9.5* 8.5*  HCT 32.5* 30.3* 27.7*  PLT 464* 480* 341   BMET Recent Labs    10/24/19 1154 10/24/19 1945 10/25/19 0313  NA 136  --   140  K 3.7  --  3.7  CL 102  --  106  CO2 25  --  23  GLUCOSE 107*  --  87  BUN 8  --  8  CREATININE 0.68 0.86 0.67  CALCIUM 9.1  --  8.7*   LFT Recent Labs    10/25/19 0313  PROT 5.9*  ALBUMIN 2.8*  AST 9*  ALT 8  ALKPHOS 74  BILITOT 0.7   PT/INR Recent Labs    10/25/19 0313  LABPROT 12.8  INR 1.0   Hepatitis Panel No results for input(s): HEPBSAG, HCVAB, HEPAIGM, HEPBIGM in the last 72 hours.      IMPRESSION:  #54 54 year old white female with onset of current illness about 5 months ago with vaginal discharge which has been persistent, now described as purulent/feculent appearing. 59-month history of progressive lower abdominal pain with acute worsening over the past 2 weeks.  Outpatient CT imaging with complex pelvic/right  adnexal process possibly involving the appendix and right pelvic fluid and gas collection which also extends into the right lateral side of the sigmoid, in addition with 2 small abscesses one involving the right adnexa and 1 along the right lateral sigmoid. There is a complex fistulous network. She failed outpatient management with oral antibiotics and analgesics. In addition on exam had been felt to have a possible rectal mass. Unclear whether this is secondary to the severe inflammatory process/abscesses.  Repeat imaging 10/23/2019 shows similar extensive complex fistulous network associated with the appendix, colon and vagina. Persistent gas and fluid collection along the right pelvis and enlarging cystic fluid collection in the right adnexa consistent with worsening abscess  Unclear if original etiology secondary to appendicitis, diverticulitis or possible underlying IBD. Malignancy less likely though rectal mass/inflammatory process needs further evaluation.  Plan; continue IV Zosyn Start full liquid diet Trend CBC and CRP Expect 5 to 7 days of IV Zosyn, then repeat imaging as long as she remains clinically stable and without deterioration.  Long-range surgical plans as per surgery. Colonoscopy contraindicated at present, would consider limited sigmoidoscopy if repeat imaging shows improvement. We will follow with you     Ronna Herskowitz EsterwoodPA-C  10/25/2019, 10:41 AM

## 2019-10-25 NOTE — Plan of Care (Signed)
Plan of care reviewed and discussed with the patient. 

## 2019-10-26 DIAGNOSIS — N824 Other female intestinal-genital tract fistulae: Secondary | ICD-10-CM

## 2019-10-26 LAB — BASIC METABOLIC PANEL
Anion gap: 9 (ref 5–15)
BUN: 6 mg/dL (ref 6–20)
CO2: 25 mmol/L (ref 22–32)
Calcium: 9 mg/dL (ref 8.9–10.3)
Chloride: 106 mmol/L (ref 98–111)
Creatinine, Ser: 0.67 mg/dL (ref 0.44–1.00)
GFR calc Af Amer: >60 mL/min (ref >60)
GFR calc non Af Amer: >60 mL/min (ref >60)
Glucose, Bld: 90 mg/dL (ref 70–99)
Potassium: 3.9 mmol/L (ref 3.5–5.1)
Sodium: 140 mmol/L (ref 135–145)

## 2019-10-26 LAB — CBC
HCT: 28 % — ABNORMAL LOW (ref 36.0–46.0)
Hemoglobin: 8.5 g/dL — ABNORMAL LOW (ref 12.0–15.0)
MCH: 31.8 pg (ref 26.0–34.0)
MCHC: 30.4 g/dL (ref 30.0–36.0)
MCV: 104.9 fL — ABNORMAL HIGH (ref 80.0–100.0)
Platelets: 371 10*3/uL (ref 150–400)
RBC: 2.67 MIL/uL — ABNORMAL LOW (ref 3.87–5.11)
RDW: 15.5 % (ref 11.5–15.5)
WBC: 6.3 10*3/uL (ref 4.0–10.5)
nRBC: 0 % (ref 0.0–0.2)

## 2019-10-26 MED ORDER — HYDROCODONE-ACETAMINOPHEN 5-325 MG PO TABS
1.0000 | ORAL_TABLET | ORAL | Status: DC | PRN
  Administered 2019-10-26 – 2019-10-31 (×27): 2 via ORAL
  Administered 2019-11-01: 1 via ORAL
  Administered 2019-11-01 (×4): 2 via ORAL
  Administered 2019-11-01: 1 via ORAL
  Administered 2019-11-02 – 2019-11-05 (×15): 2 via ORAL
  Filled 2019-10-26 (×19): qty 2
  Filled 2019-10-26: qty 1
  Filled 2019-10-26: qty 2
  Filled 2019-10-26: qty 1
  Filled 2019-10-26 (×27): qty 2

## 2019-10-26 NOTE — Progress Notes (Signed)
   Subjective/Chief Complaint: She reports her abdominal pain is slightly less this moring Tolerating full liquids Passing flatus   Objective: Vital signs in last 24 hours: Temp:  [98.1 F (36.7 C)-98.8 F (37.1 C)] 98.8 F (37.1 C) (09/04 0557) Pulse Rate:  [62-68] 62 (09/04 0557) Resp:  [15-18] 16 (09/04 0557) BP: (102-113)/(69-76) 110/74 (09/04 0557) SpO2:  [96 %-98 %] 97 % (09/04 0557) Last BM Date: 10/23/19  Intake/Output from previous day: 09/03 0701 - 09/04 0700 In: 1719.2 [P.O.:240; I.V.:1152.2; IV Piggyback:327] Out: 2 [Urine:2] Intake/Output this shift: No intake/output data recorded.  Exam: Awake and alert Looks comfortable Abdomin soft with minimal tenderness  Lab Results:  Recent Labs    10/25/19 0313 10/26/19 0300  WBC 7.5 6.3  HGB 8.5* 8.5*  HCT 27.7* 28.0*  PLT 341 371   BMET Recent Labs    10/25/19 0313 10/26/19 0300  NA 140 140  K 3.7 3.9  CL 106 106  CO2 23 25  GLUCOSE 87 90  BUN 8 6  CREATININE 0.67 0.67  CALCIUM 8.7* 9.0   PT/INR Recent Labs    10/25/19 0313  LABPROT 12.8  INR 1.0   ABG No results for input(s): PHART, HCO3 in the last 72 hours.  Invalid input(s): PCO2, PO2  Studies/Results: No results found.  Anti-infectives: Anti-infectives (From admission, onward)   Start     Dose/Rate Route Frequency Ordered Stop   10/25/19 1000  vancomycin (VANCOCIN) IVPB 1000 mg/200 mL premix        1,000 mg 200 mL/hr over 60 Minutes Intravenous Every 12 hours 10/24/19 1835     10/24/19 2300  piperacillin-tazobactam (ZOSYN) IVPB 3.375 g        3.375 g 12.5 mL/hr over 240 Minutes Intravenous Every 8 hours 10/24/19 1835     10/24/19 2000  vancomycin (VANCOREADY) IVPB 1500 mg/300 mL  Status:  Discontinued        1,500 mg 150 mL/hr over 120 Minutes Intravenous  Once 10/24/19 1835 10/24/19 1836   10/24/19 2000  vancomycin (VANCOREADY) IVPB 1500 mg/300 mL        1,500 mg 150 mL/hr over 120 Minutes Intravenous  Once 10/24/19 1836  10/24/19 2229   10/24/19 1730  piperacillin-tazobactam (ZOSYN) IVPB 3.375 g        3.375 g 100 mL/hr over 30 Minutes Intravenous  Once 10/24/19 1721 10/24/19 1810      Assessment/Plan: 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

## 2019-10-26 NOTE — Plan of Care (Signed)
  Problem: Clinical Measurements: Goal: Respiratory complications will improve Outcome: Progressing   Problem: Clinical Measurements: Goal: Cardiovascular complication will be avoided Outcome: Progressing   Problem: Activity: Goal: Risk for activity intolerance will decrease Outcome: Progressing   Problem: Nutrition: Goal: Adequate nutrition will be maintained Outcome: Progressing   Problem: Pain Managment: Goal: General experience of comfort will improve Outcome: Progressing   Problem: Safety: Goal: Ability to remain free from injury will improve Outcome: Progressing

## 2019-10-26 NOTE — Progress Notes (Signed)
Md paged concerning pain medication. Rn awaiting call back or emr orders.

## 2019-10-26 NOTE — Progress Notes (Addendum)
PROGRESS NOTE    Susan Todd  ION:[id number] DOB: [birth year] DOA: 10/24/2019 PCP: Wenda Low, MD   Brief Narrative:  This 54 y.o.femalewith medical history significant forperHTN/HLD, anxiety/depression,seizure disorder, COPDwith history of previous diverticular disease presented with abdominal pain up to 9 out of 10 in lower abdomen,  worse with movement and palpation. Pain going on since early July initially with vaginal discharge, seen by her gynecologist and had multiple evaluation and was on different antibiotics subsequently referred to GI and had CT abdomen pelvis most recent one was done 10/23/2019 which showed several abscesses within the abdomen coming from vagina rectal region with concern for potential perforated appendicitis, She has been on Cipro/Flagyl did not tolerate well and most recently on Augmentin, 8 days prior to admission with minimal improvement. Her GI has referred her to the ED for admission with IR and surgical evaluation. She has been admitted for sepsis secondary to intra-abdominal abscess, Gynecology, General surgery and Gastroenterology consulted.  Abscess not drainable at this point, recommended IV antibiotics and pain control.   Assessment & Plan:   Principal Problem:   Intra-abdominal abscess ([place]) Active Problems:   Depression   Anxiety   Seizures (HCC)   COPD (chronic obstructive pulmonary disease) (HCC)   Seizure (HCC)   Intra-abdominal abscesswithproximal fistulous [place], colonic thickening, possible rectal masswith Sepsis POA:  Sepsis parameters resolved, on admission met sepsis criteria with leukocytosis12K,RR >20, HR >90:  Appreciate surgical input, small pelvic abscesses concerning for diverticulitis versus appendicitis possible rectal mass.   Continue  IV fluids, ice seeds, vancomycin and Zosyn hopefully can de-escalate to Zosyn alone soon.   Follow-up culture. No growth so far.  IR does not feel there is a window for  percutaneous abscess drainage.  GI recommended continue IV antibiotics and full liquid diet , Consider flex sigmoidoscopy or colonoscopy in the coming days if acute infectious process resolves. Gyn has been consulted in the ED,  awaiting further input.  Pain is not well controlled ,  Increased vicodin 1-2 tabs and morphine to 4 mg IV prn. Please refer to [facility]CS input this am as well.  HTN:  BP is stable,  continue to hold home losartan 25 mg/Lopressor 25 mgbid.    Anemia- unclear etiology hemoglobin 8.5 g, on admission 10.2 g.  Monitor, check anemia panel.  KGM:WNUU home asa and lipitor.  Anxiety/depression/Hx of Seizure disorder: Mood is stable. continue  homeAtivan 1 mg bedtime/Neurontin/hydrocodoneCont the same.  COPD: not inexacerbation.  Tobacco abuse: keep on nicotine patch.  DVT prophylaxis: Lovenox Code Status: Full Family Communication:  No one at bed side. Disposition Plan:   Dispo: The patient is from: Home  Anticipated d/c is to: Home  Anticipated d/c date is: > 3 days  Patient currently is not medically stable to d/c. Consultants:    General surgery, gastroenterology, gynecology.  Procedures:   Antimicrobials:  Anti-infectives (From admission, onward)   Start     Dose/Rate Route Frequency Ordered Stop   10/25/19 1000  vancomycin (VANCOCIN) IVPB 1000 mg/200 mL premix        1,000 mg 200 mL/hr over 60 Minutes Intravenous Every 12 hours 10/24/19 1835     10/24/19 2300  piperacillin-tazobactam (ZOSYN) IVPB 3.375 g        3.375 g 12.5 mL/hr over 240 Minutes Intravenous Every 8 hours 10/24/19 1835     10/24/19 2000  vancomycin (VANCOREADY) IVPB 1500 mg/300 mL  Status:  Discontinued        1,500 mg 150 mL/hr  over 120 Minutes Intravenous  Once 10/24/19 1835 10/24/19 1836   10/24/19 2000  vancomycin (VANCOREADY) IVPB 1500 mg/300 mL        1,500 mg 150 mL/hr over 120 Minutes Intravenous  Once 10/24/19 1836 10/24/19 2229    10/24/19 1730  piperacillin-tazobactam (ZOSYN) IVPB 3.375 g        3.375 g 100 mL/hr over 30 Minutes Intravenous  Once 10/24/19 1721 10/24/19 1810      Subjective: Patient was seen and examined at bedside.  Overnight events noted.  She is still complains of having mild abdominal pain,  denies nausea, vomiting and diarrhea.  overall she feels better.  Objective: Vitals:   10/25/19 2207 10/26/19 0557 10/26/19 1000 10/26/19 1344  BP: 113/76 110/74 122/77 122/78  Pulse: 68 62 63 71  Resp: '16 16 17 19  ' Temp: 98.1 F (36.7 C) 98.8 F (37.1 C) 98.6 F (37 C) 98.1 F (36.7 C)  TempSrc:   Oral Oral  SpO2: 97% 97% 100% 96%  Weight:      Height:        Intake/Output Summary (Last 24 hours) at 10/26/2019 1525 Last data filed at 10/26/2019 1322 Gross per 24 hour  Intake 3335.33 ml  Output 2 ml  Net 3333.33 ml   Filed Weights   10/24/19 0751  Weight: 78.5 kg    Examination:  General exam: Appears calm and comfortable  Respiratory system: Clear to auscultation. Respiratory effort normal. Cardiovascular system: S1 & S2 heard, RRR. No JVD, murmurs, rubs, gallops or clicks. No pedal edema. Gastrointestinal system: Abdomen is nondistended, soft and  Mildly tender. No organomegaly or masses felt. Normal bowel sounds heard. Central nervous system: Alert and oriented. No focal neurological deficits. Extremities:  No leg edema, peripheral pulses noted. Skin: No rashes, lesions or ulcers Psychiatry: Judgement and insight appear normal. Mood & affect appropriate.     Data Reviewed: I have personally reviewed following labs and imaging studies  CBC: Recent Labs  Lab 10/24/19 1154 10/24/19 1945 10/25/19 0313 10/26/19 0300  WBC 12.6* 11.4* 7.5 6.3  HGB 10.2* 9.5* 8.5* 8.5*  HCT 32.5* 30.3* 27.7* 28.0*  MCV 102.5* 103.1* 103.7* 104.9*  PLT 464* 480* 341 951   Basic Metabolic Panel: Recent Labs  Lab 10/24/19 1154 10/24/19 1945 10/25/19 0313 10/26/19 0300  NA 136  --  140 140   K 3.7  --  3.7 3.9  CL 102  --  106 106  CO2 25  --  23 25  GLUCOSE 107*  --  87 90  BUN 8  --  8 6  CREATININE 0.68 0.86 0.67 0.67  CALCIUM 9.1  --  8.7* 9.0   GFR: Estimated Creatinine Clearance: 86.4 mL/min (by C-G formula based on SCr of 0.67 mg/dL). Liver Function Tests: Recent Labs  Lab 10/24/19 1154 10/25/19 0313  AST 12* 9*  ALT 9 8  ALKPHOS 92 74  BILITOT 0.6 0.7  PROT 7.3 5.9*  ALBUMIN 3.4* 2.8*   No results for input(s): LIPASE, AMYLASE in the last 168 hours. No results for input(s): AMMONIA in the last 168 hours. Coagulation Profile: Recent Labs  Lab 10/25/19 0313  INR 1.0   Cardiac Enzymes: No results for input(s): CKTOTAL, CKMB, CKMBINDEX, TROPONINI in the last 168 hours. BNP (last 3 results) No results for input(s): PROBNP in the last 8760 hours. HbA1C: No results for input(s): HGBA1C in the last 72 hours. CBG: No results for input(s): GLUCAP in the last 168 hours. Lipid Profile:  No results for input(s): CHOL, HDL, LDLCALC, TRIG, CHOLHDL, LDLDIRECT in the last 72 hours. Thyroid Function Tests: No results for input(s): TSH, T4TOTAL, FREET4, T3FREE, THYROIDAB in the last 72 hours. Anemia Panel: No results for input(s): VITAMINB12, FOLATE, FERRITIN, TIBC, IRON, RETICCTPCT in the last 72 hours. Sepsis Labs: Recent Labs  Lab 10/24/19 1620 10/24/19 1945 10/25/19 0313  PROCALCITON  --   --  <0.10  LATICACIDVEN 1.7 1.1  --     Recent Results (from the past 240 hour(s))  SARS Coronavirus 2 by RT PCR (hospital order, performed in [facility] hospital lab) Nasopharyngeal Nasopharyngeal Swab     Status: None   Collection Time: 10/24/19  3:48 PM   Specimen: Nasopharyngeal Swab  Result Value Ref Range Status   SARS Coronavirus 2 NEGATIVE NEGATIVE Final    Comment: (NOTE) SARS-CoV-2 target nucleic acids are NOT DETECTED.  The SARS-CoV-2 RNA is generally detectable in upper and lower respiratory specimens during the acute phase of infection. The lowest  concentration of SARS-CoV-2 viral copies this assay can detect is 250 copies / mL. A negative result does not preclude SARS-CoV-2 infection and should not be used as the sole basis for treatment or other patient management decisions.  A negative result may occur with improper specimen collection / handling, submission of specimen other than nasopharyngeal swab, presence of viral mutation(s) within the areas targeted by this assay, and inadequate number of viral copies (<250 copies / mL). A negative result must be combined with clinical observations, patient history, and epidemiological information.  Fact Sheet for Patients:   StrictlyIdeas.no  Fact Sheet for Healthcare Providers: BankingDealers.co.za  This test is not yet approved or  cleared by the [place] FDA and has been authorized for detection and/or diagnosis of SARS-CoV-2 by FDA under an Emergency Use Authorization (EUA).  This EUA will remain in effect (meaning this test can be used) for the duration of the COVID-19 declaration under Section 564(b)(1) of the Act, 21 U.S.C. section 360bbb-3(b)(1), unless the authorization is terminated or revoked sooner.  Performed at [facility], [place] [address], [place], [place] [address]   Culture, blood (routine x 2)     Status: None (Preliminary result)   Collection Time: 10/24/19  7:46 PM   Specimen: BLOOD  Result Value Ref Range Status   Specimen Description   Final    BLOOD RIGHT ANTECUBITAL Performed at [facility] [address], [place], [place] [address]    Special Requests   Final    BOTTLES DRAWN AEROBIC AND ANAEROBIC Blood Culture adequate volume Performed at [facility] [address], [place], [place] [address]    Culture   Final    NO GROWTH < 12 HOURS Performed at [facility] [address], [place], [place] [address]    Report Status PENDING   Incomplete  Culture, blood (routine x 2)     Status: None (Preliminary result)   Collection Time: 10/24/19  7:52 PM   Specimen: BLOOD  Result Value Ref Range Status   Specimen Description   Final    BLOOD BLOOD RIGHT HAND Performed at [facility] [address], [place], [place] [address]    Special Requests   Final    BOTTLES DRAWN AEROBIC AND ANAEROBIC Blood Culture adequate volume Performed at [facility] [address] [place], [place] [address]    Culture   Final    NO GROWTH < 12 HOURS  Performed at [facility], [place] [address], [place], [place] [address]    Report Status PENDING  Incomplete  Surgical PCR screen     Status: None   Collection Time: 10/25/19 12:14 AM   Specimen: Nasal Mucosa; Nasal Swab  Result Value Ref Range Status   MRSA, PCR NEGATIVE NEGATIVE Final   Staphylococcus aureus NEGATIVE NEGATIVE Final    Comment: (NOTE) The Xpert SA Assay (FDA approved for NASAL specimens in patients 42 years of age and older), is one component of a comprehensive surveillance program. It is not intended to diagnose infection nor to guide or monitor treatment. Performed at [facility], [place] [address], [place], [place] [address]      Radiology Studies: No results found.  Scheduled Meds: . aspirin EC  81 mg Oral Daily  . atorvastatin  10 mg Oral QPM  . calcium-vitamin D  1 tablet Oral QHS  . enoxaparin (LOVENOX) injection  40 mg Subcutaneous Q24H  . loratadine  10 mg Oral Daily  . multivitamin with minerals  1 tablet Oral QHS  . nicotine  7 mg Transdermal Daily   Continuous Infusions: . lactated ringers    . lactated ringers 75 mL/hr at 10/26/19 1322  . piperacillin-tazobactam (ZOSYN)  IV 3.375 g (10/26/19 1411)  . vancomycin Stopped (10/26/19 0610)     LOS: 2 days    Time spent: 25 mins.    Shawna Clamp, MD [facility]   If 7PM-7AM, please contact night-coverage

## 2019-10-26 NOTE — Progress Notes (Signed)
    Progress Note   Subjective  Lower abdominal pain slightly improved. Decrease in vaginal drainage noted.    Objective  Vital signs in last 24 hours: Temp:  [98.1 F (36.7 C)-98.8 F (37.1 C)] 98.8 F (37.1 C) (09/04 0557) Pulse Rate:  [62-68] 62 (09/04 0557) Resp:  [15-18] 16 (09/04 0557) BP: (102-113)/(69-76) 110/74 (09/04 0557) SpO2:  [96 %-98 %] 97 % (09/04 0557) Last BM Date: 10/23/19  General: Alert, well-developed, in NAD Heart:  Regular rate and rhythm; no murmurs Chest: Clear to ascultation bilaterally Abdomen:  Soft, suprapubic tenderness and nondistended. Normal bowel sounds, without guarding, and without rebound.   Extremities:  Without edema. Neurologic:  Alert and  oriented x4; grossly normal neurologically. Psych:  Alert and cooperative. Normal mood and affect.  Intake/Output from previous day: 09/03 0701 - 09/04 0700 In: 1719.2 [P.O.:240; I.V.:1152.2; IV Piggyback:327] Out: 2 [Urine:2] Intake/Output this shift: No intake/output data recorded.  Lab Results: Recent Labs    10/24/19 1945 10/25/19 0313 10/26/19 0300  WBC 11.4* 7.5 6.3  HGB 9.5* 8.5* 8.5*  HCT 30.3* 27.7* 28.0*  PLT 480* 341 371   BMET Recent Labs    10/24/19 1154 10/24/19 1154 10/24/19 1945 10/25/19 0313 10/26/19 0300  NA 136  --   --  140 140  K 3.7  --   --  3.7 3.9  CL 102  --   --  106 106  CO2 25  --   --  23 25  GLUCOSE 107*  --   --  87 90  BUN 8  --   --  8 6  CREATININE 0.68   < > 0.86 0.67 0.67  CALCIUM 9.1  --   --  8.7* 9.0   < > = values in this interval not displayed.   LFT Recent Labs    10/25/19 0313  PROT 5.9*  ALBUMIN 2.8*  AST 9*  ALT 8  ALKPHOS 74  BILITOT 0.7   PT/INR Recent Labs    10/25/19 0313  LABPROT 12.8  INR 1.0     Assessment & Recommendations   1. Complex intra-abdominal inflammatory / infectious process with abscess. 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 IBD needs to be excluded. Abscess not amenable to IR drainage. Continue with plans for a prolonged course of IV antibiotics and full liquid diet. Consider flex sigmoidoscopy or colonoscopy in the coming days after she has made significant progress. GI will see again on Monday or Tuesday. Available if needed over the holiday weekend.     LOS: 2 days   Judie Petit T. Russella Dar MD 10/26/2019, 9:51 AM

## 2019-10-27 LAB — BASIC METABOLIC PANEL
Anion gap: 8 (ref 5–15)
BUN: <5 mg/dL — ABNORMAL LOW (ref 6–20)
CO2: 27 mmol/L (ref 22–32)
Calcium: 9.3 mg/dL (ref 8.9–10.3)
Chloride: 108 mmol/L (ref 98–111)
Creatinine, Ser: 0.67 mg/dL (ref 0.44–1.00)
GFR calc Af Amer: >60 mL/min (ref >60)
GFR calc non Af Amer: >60 mL/min (ref >60)
Glucose, Bld: 95 mg/dL (ref 70–99)
Potassium: 3.9 mmol/L (ref 3.5–5.1)
Sodium: 143 mmol/L (ref 135–145)

## 2019-10-27 LAB — CBC
HCT: 29.3 % — ABNORMAL LOW (ref 36.0–46.0)
Hemoglobin: 8.8 g/dL — ABNORMAL LOW (ref 12.0–15.0)
MCH: 31.4 pg (ref 26.0–34.0)
MCHC: 30 g/dL (ref 30.0–36.0)
MCV: 104.6 fL — ABNORMAL HIGH (ref 80.0–100.0)
Platelets: 388 10*3/uL (ref 150–400)
RBC: 2.8 MIL/uL — ABNORMAL LOW (ref 3.87–5.11)
RDW: 15.2 % (ref 11.5–15.5)
WBC: 4.6 10*3/uL (ref 4.0–10.5)
nRBC: 0 % (ref 0.0–0.2)

## 2019-10-27 LAB — MAGNESIUM: Magnesium: 1.8 mg/dL (ref 1.7–2.4)

## 2019-10-27 LAB — PHOSPHORUS: Phosphorus: 5 mg/dL — ABNORMAL HIGH (ref 2.5–4.6)

## 2019-10-27 NOTE — Progress Notes (Signed)
   Subjective/Chief Complaint: Less pain today Passing flatus Tolerating full liquids   Objective: Vital signs in last 24 hours: Temp:  [97.7 F (36.5 C)-98.6 F (37 C)] 97.7 F (36.5 C) (09/05 0602) Pulse Rate:  [62-71] 63 (09/05 0602) Resp:  [15-19] 15 (09/05 0602) BP: (122-137)/(77-89) 137/89 (09/05 0602) SpO2:  [96 %-100 %] 97 % (09/05 0602) Last BM Date: 10/24/19  Intake/Output from previous day: 09/04 0701 - 09/05 0700 In: 2997.9 [P.O.:1320; I.V.:882.4; IV Piggyback:795.5] Out: -  Intake/Output this shift: No intake/output data recorded.  Exam: Awake and alert Abdomen still a little full, much less tenderness and guarding   Lab Results:  Recent Labs    10/26/19 0300 10/27/19 0327  WBC 6.3 4.6  HGB 8.5* 8.8*  HCT 28.0* 29.3*  PLT 371 388   BMET Recent Labs    10/26/19 0300 10/27/19 0327  NA 140 143  K 3.9 3.9  CL 106 108  CO2 25 27  GLUCOSE 90 95  BUN 6 <5*  CREATININE 0.67 0.67  CALCIUM 9.0 9.3   PT/INR Recent Labs    10/25/19 0313  LABPROT 12.8  INR 1.0   ABG No results for input(s): PHART, HCO3 in the last 72 hours.  Invalid input(s): PCO2, PO2  Studies/Results: No results found.  Anti-infectives: Anti-infectives (From admission, onward)   Start     Dose/Rate Route Frequency Ordered Stop   10/25/19 1000  vancomycin (VANCOCIN) IVPB 1000 mg/200 mL premix        1,000 mg 200 mL/hr over 60 Minutes Intravenous Every 12 hours 10/24/19 1835     10/24/19 2300  piperacillin-tazobactam (ZOSYN) IVPB 3.375 g        3.375 g 12.5 mL/hr over 240 Minutes Intravenous Every 8 hours 10/24/19 1835     10/24/19 2000  vancomycin (VANCOREADY) IVPB 1500 mg/300 mL  Status:  Discontinued        1,500 mg 150 mL/hr over 120 Minutes Intravenous  Once 10/24/19 1835 10/24/19 1836   10/24/19 2000  vancomycin (VANCOREADY) IVPB 1500 mg/300 mL        1,500 mg 150 mL/hr over 120 Minutes Intravenous  Once 10/24/19 1836 10/24/19 2229   10/24/19 1730   piperacillin-tazobactam (ZOSYN) IVPB 3.375 g        3.375 g 100 mL/hr over 30 Minutes Intravenous  Once 10/24/19 1721 10/24/19 1810      Assessment/Plan: 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

## 2019-10-27 NOTE — Plan of Care (Signed)
  Problem: Clinical Measurements: Goal: Respiratory complications will improve Outcome: Progressing   Problem: Clinical Measurements: Goal: Cardiovascular complication will be avoided Outcome: Progressing   Problem: Nutrition: Goal: Adequate nutrition will be maintained Outcome: Progressing   Problem: Pain Managment: Goal: General experience of comfort will improve Outcome: Progressing   Problem: Safety: Goal: Ability to remain free from injury will improve Outcome: Progressing   

## 2019-10-27 NOTE — Progress Notes (Signed)
PROGRESS NOTE    SHAUGHNESSY GETHERS  MEQ:[id number] DOB: 1965/09/21 DOA: 10/24/2019 PCP: Wenda Low, MD   Brief Narrative:  This 54 y.o.femalewith medical history significant forperHTN/HLD, anxiety/depression,seizure disorder, COPDwith history of previous diverticular disease presented with abdominal pain up to 9 out of 10 in lower abdomen,  worse with movement and palpation. Pain going on since early July initially with vaginal discharge, seen by her gynecologist and had multiple evaluation and was on different antibiotics subsequently referred to GI and had CT abdomen pelvis most recent one was done 10/23/2019 which showed several abscesses within the abdomen coming from vagina rectal region with concern for potential perforated appendicitis, She has been on Cipro/Flagyl did not tolerate well and most recently on Augmentin, 8 days prior to admission with minimal improvement. Her GI has referred her to the ED for admission with IR and surgical evaluation. She has been admitted for sepsis secondary to intra-abdominal abscess, Gynecology, General surgery and Gastroenterology consulted.  Abscess not drainable at this point, surgery recommended IV antibiotics and pain control.   Assessment & Plan:   Principal Problem:   Intra-abdominal abscess ([place]) Active Problems:   Depression   Anxiety   Seizures (HCC)   COPD (chronic obstructive pulmonary disease) (HCC)   Seizure (HCC)   Intra-abdominal abscesswithproximal fistulous [place], colonic thickening, possible rectal masswith Sepsis POA:  Sepsis parameters resolved, on admission met sepsis criteria with leukocytosis12K,RR >20, HR >90:  Appreciate surgical input, small pelvic abscesses concerning for diverticulitis versus appendicitis possible rectal mass.   Continue  IV fluids, vancomycin and Zosyn hopefully can de-escalate to Zosyn alone soon.   Follow-up culture. No growth so far.  IR does not feel there is a window for percutaneous  abscess drainage.  GI recommended continue IV antibiotics and full liquid diet , Consider flex sigmoidoscopy or colonoscopy in the coming days if acute infectious process resolves. Gyn has been consulted in the ED,  awaiting further input.  Pain was not well controlled ,  Increased vicodin 1-2 tabs and morphine to 4 mg IV prn. General surgery recommended full liquids, continue IV antibiotics.  HTN:  BP is stable,  continue to hold home losartan 25 mg/Lopressor 25 mgbid.    Anemia- unclear etiology hemoglobin 8.8 g today, on admission 10.2 g.  Monitor, check anemia panel.  [facility]N:LGXQ home asa and lipitor.  Anxiety/depression/Hx of Seizure disorder: Mood is stable. continue  homeAtivan 1 mg bedtime/Neurontin/hydrocodoneCont the same.  COPD: not inexacerbation.  Tobacco abuse: keep on nicotine patch.  DVT prophylaxis: Lovenox Code Status: Full Family Communication:  No one at bed side. Disposition Plan:   Dispo: The patient is from: Home  Anticipated d/c is to: Home  Anticipated d/c date is: > 3 days  Patient currently is not medically stable to d/c. Consultants:    General surgery, gastroenterology, gynecology.  Procedures:   Antimicrobials:  Anti-infectives (From admission, onward)   Start     Dose/Rate Route Frequency Ordered Stop   10/25/19 1000  vancomycin (VANCOCIN) IVPB 1000 mg/200 mL premix        1,000 mg 200 mL/hr over 60 Minutes Intravenous Every 12 hours 10/24/19 1835     10/24/19 2300  piperacillin-tazobactam (ZOSYN) IVPB 3.375 g        3.375 g 12.5 mL/hr over 240 Minutes Intravenous Every 8 hours 10/24/19 1835     10/24/19 2000  vancomycin (VANCOREADY) IVPB 1500 mg/300 mL  Status:  Discontinued        1,500 mg 150 mL/hr over  120 Minutes Intravenous  Once 10/24/19 1835 10/24/19 1836   10/24/19 2000  vancomycin (VANCOREADY) IVPB 1500 mg/300 mL        1,500 mg 150 mL/hr over 120 Minutes Intravenous  Once 10/24/19  1836 10/24/19 2229   10/24/19 1730  piperacillin-tazobactam (ZOSYN) IVPB 3.375 g        3.375 g 100 mL/hr over 30 Minutes Intravenous  Once 10/24/19 1721 10/24/19 1810      Subjective: Patient was seen and examined at bedside.  Overnight events noted.  She reports feeling much better,  abdominal pain has improved significantly,  denies nausea, vomiting ,diarrhea.  She wants to advance the diet.  Objective: Vitals:   10/26/19 1344 10/26/19 2224 10/27/19 0602 10/27/19 1341  BP: 122/78 129/79 137/89 134/77  Pulse: 71 62 63 71  Resp: _0 Temp: 98.1 F (36.7 C) 98.3 F (36.8 C) 97.7 F (36.5 C) 97.7 F (36.5 C)  TempSrc: Oral Oral Oral   SpO2: 96% 98% 97% 100%  Weight:      Height:        Intake/Output Summary (Last 24 hours) at 10/27/2019 1344 Last data filed at 10/27/2019 0602 Gross per 24 hour  Intake 2191.9 ml  Output --  Net 2191.9 ml   Filed Weights   10/24/19 0751  Weight: 78.5 kg    Examination:  General exam: Appears calm and comfortable  Respiratory system: Clear to auscultation. Respiratory effort normal. Cardiovascular system: S1 & S2 heard, RRR. No JVD, murmurs, rubs, gallops or clicks. No pedal edema. Gastrointestinal system: Abdomen is nondistended, soft and  Mildly tender. No organomegaly or masses felt.  Normal bowel sounds heard. Central nervous system: Alert and oriented. No focal neurological deficits. Extremities:  No leg edema, peripheral pulses noted. Skin: No rashes, lesions or ulcers Psychiatry: Judgement and insight appear normal. Mood & affect appropriate.     Data Reviewed: I have personally reviewed following labs and imaging studies  CBC: Recent Labs  Lab 10/24/19 1154 10/24/19 1945 10/25/19 0313 10/26/19 0300 10/27/19 0327  WBC 12.6* 11.4* 7.5 6.3 4.6  HGB 10.2* 9.5* 8.5* 8.5* 8.8*  HCT 32.5* 30.3* 27.7* 28.0* 29.3*  MCV 102.5* 103.1* 103.7* 104.9* 104.6*  PLT 464* 480* 341 371 845   Basic Metabolic Panel: Recent  Labs  Lab 10/24/19 1154 10/24/19 1945 10/25/19 0313 10/26/19 0300 10/27/19 0327  NA 136  --  140 140 143  K 3.7  --  3.7 3.9 3.9  CL 102  --  106 106 108  CO2 25  --  _1 GLUCOSE 107*  --  87 90 95  BUN 8  --  8 6 <5*  CREATININE 0.68 0.86 0.67 0.67 0.67  CALCIUM 9.1  --  8.7* 9.0 9.3  MG  --   --   --   --  1.8  PHOS  --   --   --   --  5.0*   GFR: Estimated Creatinine Clearance: 86.4 mL/min (by C-G formula based on SCr of 0.67 mg/dL). Liver Function Tests: Recent Labs  Lab 10/24/19 1154 10/25/19 0313  AST 12* 9*  ALT 9 8  ALKPHOS 92 74  BILITOT 0.6 0.7  PROT 7.3 5.9*  ALBUMIN 3.4* 2.8*   No results for input(s): LIPASE, AMYLASE in the last 168 hours. No results for input(s): AMMONIA in the last 168 hours. Coagulation Profile: Recent Labs  Lab 10/25/19 0313  INR 1.0   Cardiac Enzymes: No results  for input(s): CKTOTAL, CKMB, CKMBINDEX, TROPONINI in the last 168 hours. BNP (last 3 results) No results for input(s): PROBNP in the last 8760 hours. HbA1C: No results for input(s): HGBA1C in the last 72 hours. CBG: No results for input(s): GLUCAP in the last 168 hours. Lipid Profile: No results for input(s): CHOL, HDL, LDLCALC, TRIG, CHOLHDL, LDLDIRECT in the last 72 hours. Thyroid Function Tests: No results for input(s): TSH, T4TOTAL, FREET4, T3FREE, THYROIDAB in the last 72 hours. Anemia Panel: No results for input(s): VITAMINB12, FOLATE, FERRITIN, TIBC, IRON, RETICCTPCT in the last 72 hours. Sepsis Labs: Recent Labs  Lab 10/24/19 1620 10/24/19 1945 10/25/19 0313  PROCALCITON  --   --  <0.10  LATICACIDVEN 1.7 1.1  --     Recent Results (from the past 240 hour(s))  SARS Coronavirus 2 by RT PCR (hospital order, performed in [facility] hospital lab) Nasopharyngeal Nasopharyngeal Swab     Status: None   Collection Time: 10/24/19  3:48 PM   Specimen: Nasopharyngeal Swab  Result Value Ref Range Status   SARS Coronavirus 2 NEGATIVE NEGATIVE Final     Comment: (NOTE) SARS-CoV-2 target nucleic acids are NOT DETECTED.  The SARS-CoV-2 RNA is generally detectable in upper and lower respiratory specimens during the acute phase of infection. The lowest concentration of SARS-CoV-2 viral copies this assay can detect is 250 copies / mL. A negative result does not preclude SARS-CoV-2 infection and should not be used as the sole basis for treatment or other patient management decisions.  A negative result may occur with improper specimen collection / handling, submission of specimen other than nasopharyngeal swab, presence of viral mutation(s) within the areas targeted by this assay, and inadequate number of viral copies (<250 copies / mL). A negative result must be combined with clinical observations, patient history, and epidemiological information.  Fact Sheet for Patients:   StrictlyIdeas.no  Fact Sheet for Healthcare Providers: BankingDealers.co.za  This test is not yet approved or  cleared by the [place] FDA and has been authorized for detection and/or diagnosis of SARS-CoV-2 by FDA under an Emergency Use Authorization (EUA).  This EUA will remain in effect (meaning this test can be used) for the duration of the COVID-19 declaration under Section 564(b)(1) of the Act, 21 U.S.C. section 360bbb-3(b)(1), unless the authorization is terminated or revoked sooner.  Performed at [facility], [place] [address] [place], [place] [address]   Culture, blood (routine x 2)     Status: None (Preliminary result)   Collection Time: 10/24/19  7:46 PM   Specimen: BLOOD  Result Value Ref Range Status   Specimen Description   Final    BLOOD RIGHT ANTECUBITAL Performed at [facility] [address] [place], [place] [address]    Special Requests   Final    BOTTLES DRAWN AEROBIC AND ANAEROBIC Blood Culture adequate volume Performed at [facility] [address], [place], [place] [address]    Culture   Final    NO GROWTH 3 DAYS Performed at [facility], [place] [address] [place], [place] [address]    Report Status PENDING  Incomplete  Culture, blood (routine x 2)     Status: None (Preliminary result)   Collection Time: 10/24/19  7:52 PM   Specimen: BLOOD  Result Value Ref Range Status   Specimen Description   Final    BLOOD BLOOD RIGHT HAND Performed at [place] [address] [place], [place] [address]  Special Requests   Final    BOTTLES DRAWN AEROBIC AND ANAEROBIC Blood Culture adequate volume Performed at [facility] [address], [place], [place] [address]    Culture   Final    NO GROWTH 3 DAYS Performed at [facility], [place] [address], [place], [place] [address]    Report Status PENDING  Incomplete  Surgical PCR screen     Status: None   Collection Time: 10/25/19 12:14 AM   Specimen: Nasal Mucosa; Nasal Swab  Result Value Ref Range Status   MRSA, PCR NEGATIVE NEGATIVE Final   Staphylococcus aureus NEGATIVE NEGATIVE Final    Comment: (NOTE) The Xpert SA Assay (FDA approved for NASAL specimens in patients 47 years of age and older), is one component of a comprehensive surveillance program. It is not intended to diagnose infection nor to guide or monitor treatment. Performed at [facility], [place] [address], [place], [place] [address]      Radiology Studies: No results found.  Scheduled Meds: . aspirin EC  81 mg Oral Daily  . atorvastatin  10 mg Oral QPM  . calcium-vitamin D  1 tablet Oral QHS  . enoxaparin (LOVENOX) injection  40 mg Subcutaneous Q24H  . loratadine  10 mg Oral Daily  . multivitamin with minerals  1 tablet Oral QHS  . nicotine  7 mg Transdermal Daily   Continuous Infusions: . lactated ringers    . lactated ringers 75 mL/hr at 10/27/19 1025  . piperacillin-tazobactam (ZOSYN)  IV 3.375 g (10/27/19  1339)  . vancomycin 1,000 mg (10/27/19 0558)     LOS: 3 days    Time spent: 25 mins.    Shawna Clamp, MD [facility]   If 7PM-7AM, please contact night-coverage

## 2019-10-28 DIAGNOSIS — R569 Unspecified convulsions: Secondary | ICD-10-CM

## 2019-10-28 DIAGNOSIS — J439 Emphysema, unspecified: Secondary | ICD-10-CM

## 2019-10-28 LAB — CBC
HCT: 31.3 % — ABNORMAL LOW (ref 36.0–46.0)
Hemoglobin: 9.4 g/dL — ABNORMAL LOW (ref 12.0–15.0)
MCH: 31.2 pg (ref 26.0–34.0)
MCHC: 30 g/dL (ref 30.0–36.0)
MCV: 104 fL — ABNORMAL HIGH (ref 80.0–100.0)
Platelets: 392 10*3/uL (ref 150–400)
RBC: 3.01 MIL/uL — ABNORMAL LOW (ref 3.87–5.11)
RDW: 15.4 % (ref 11.5–15.5)
WBC: 5.8 10*3/uL (ref 4.0–10.5)
nRBC: 0 % (ref 0.0–0.2)

## 2019-10-28 LAB — COMPREHENSIVE METABOLIC PANEL
ALT: 10 U/L (ref 0–44)
AST: 13 U/L — ABNORMAL LOW (ref 15–41)
Albumin: 2.7 g/dL — ABNORMAL LOW (ref 3.5–5.0)
Alkaline Phosphatase: 76 U/L (ref 38–126)
Anion gap: 9 (ref 5–15)
BUN: 6 mg/dL (ref 6–20)
CO2: 26 mmol/L (ref 22–32)
Calcium: 9.4 mg/dL (ref 8.9–10.3)
Chloride: 107 mmol/L (ref 98–111)
Creatinine, Ser: 0.67 mg/dL (ref 0.44–1.00)
GFR calc Af Amer: >60 mL/min (ref >60)
GFR calc non Af Amer: >60 mL/min (ref >60)
Glucose, Bld: 99 mg/dL (ref 70–99)
Potassium: 4 mmol/L (ref 3.5–5.1)
Sodium: 142 mmol/L (ref 135–145)
Total Bilirubin: 0.3 mg/dL (ref 0.3–1.2)
Total Protein: 6 g/dL — ABNORMAL LOW (ref 6.5–8.1)

## 2019-10-28 LAB — MAGNESIUM: Magnesium: 1.7 mg/dL (ref 1.7–2.4)

## 2019-10-28 LAB — PHOSPHORUS: Phosphorus: 5 mg/dL — ABNORMAL HIGH (ref 2.5–4.6)

## 2019-10-28 MED ORDER — POLYETHYLENE GLYCOL 3350 17 G PO PACK
17.0000 g | PACK | Freq: Every day | ORAL | Status: DC
  Administered 2019-10-28 – 2019-11-06 (×8): 17 g via ORAL
  Filled 2019-10-28 (×8): qty 1

## 2019-10-28 NOTE — Plan of Care (Signed)
  Problem: Education: Goal: Knowledge of General Education information will improve Description: Including pain rating scale, medication(s)/side effects and non-pharmacologic comfort measures Outcome: Progressing   Problem: Pain Managment: Goal: General experience of comfort will improve Outcome: Progressing   

## 2019-10-28 NOTE — Progress Notes (Signed)
Pharmacy Antibiotic Note  Susan Todd is a 54 y.o. female admitted on 10/24/2019 with intra-abdominal infection.  Pharmacy has been consulted for vancomycin dosing.  Plan:  Vancomycin 1000 mg IV q12 hr  Measure vancomycin AUC at steady state as indicated  SCr daily while on vanc and Zosyn together  Zosyn per MD, dosing appropriate   Height: 5\' 8"  (172.7 cm) Weight: 78.5 kg (173 lb 1 oz) IBW/kg (Calculated) : 63.9  Temp (24hrs), Avg:98.2 F (36.8 C), Min:97.7 F (36.5 C), Max:98.5 F (36.9 C)  Recent Labs  Lab 10/24/19 1154 10/24/19 1620 10/24/19 1945 10/25/19 0313 10/26/19 0300 10/27/19 0327 10/28/19 0418  WBC   < >  --  11.4* 7.5 6.3 4.6 5.8  CREATININE   < >  --  0.86 0.67 0.67 0.67 0.67  LATICACIDVEN  --  1.7 1.1  --   --   --   --    < > = values in this interval not displayed.    Estimated Creatinine Clearance: 86.4 mL/min (by C-G formula based on SCr of 0.67 mg/dL).    Allergies  Allergen Reactions  . Codeine Nausea And Vomiting  . Latex Hives  . Metronidazole Diarrhea    Patient is not able to take tablet form.    Antimicrobials this admission: 9/2 vanc >>  9/2 Zosyn >>   Dose adjustments this admission:n/a  Microbiology results: 9/2 COVID: neg 9/3 MRSA PCR: neg 9/2 BCx x2: ngtd  Thank you for allowing pharmacy to be a part of this patient's care.  11/2 PharmD, BCPS Clinical Pharmacist [facility] main pharmacy [phone] 10/28/2019 10:45 AM

## 2019-10-28 NOTE — Progress Notes (Signed)
   Subjective/Chief Complaint: Reports almost no pain Passing flatus but no bm   Objective: Vital signs in last 24 hours: Temp:  [97.7 F (36.5 C)-98.5 F (36.9 C)] 98.3 F (36.8 C) (09/06 0623) Pulse Rate:  [71-72] 72 (09/06 0633) Resp:  [18-20] 20 (09/06 7628) BP: (134-152)/(77-93) 140/87 (09/06 3151) SpO2:  [95 %-100 %] 95 % (09/06 7616) Last BM Date: 10/25/19  Intake/Output from previous day: 09/05 0701 - 09/06 0700 In: 2542.2 [P.O.:840; I.V.:1212.8; IV Piggyback:489.3] Out: -  Intake/Output this shift: Total I/O In: 200 [IV Piggyback:200] Out: -   Exam: Awake and alert Abdomen soft, almost non-tender  Lab Results:  Recent Labs    10/27/19 0327 10/28/19 0418  WBC 4.6 5.8  HGB 8.8* 9.4*  HCT 29.3* 31.3*  PLT 388 392   BMET Recent Labs    10/27/19 0327 10/28/19 0418  NA 143 142  K 3.9 4.0  CL 108 107  CO2 27 26  GLUCOSE 95 99  BUN <5* 6  CREATININE 0.67 0.67  CALCIUM 9.3 9.4   PT/INR No results for input(s): LABPROT, INR in the last 72 hours. ABG No results for input(s): PHART, HCO3 in the last 72 hours.  Invalid input(s): PCO2, PO2  Studies/Results: No results found.  Anti-infectives: Anti-infectives (From admission, onward)   Start     Dose/Rate Route Frequency Ordered Stop   10/25/19 1000  vancomycin (VANCOCIN) IVPB 1000 mg/200 mL premix        1,000 mg 200 mL/hr over 60 Minutes Intravenous Every 12 hours 10/24/19 1835     10/24/19 2300  piperacillin-tazobactam (ZOSYN) IVPB 3.375 g        3.375 g 12.5 mL/hr over 240 Minutes Intravenous Every 8 hours 10/24/19 1835     10/24/19 2000  vancomycin (VANCOREADY) IVPB 1500 mg/300 mL  Status:  Discontinued        1,500 mg 150 mL/hr over 120 Minutes Intravenous  Once 10/24/19 1835 10/24/19 1836   10/24/19 2000  vancomycin (VANCOREADY) IVPB 1500 mg/300 mL        1,500 mg 150 mL/hr over 120 Minutes Intravenous  Once 10/24/19 1836 10/24/19 2229   10/24/19 1730  piperacillin-tazobactam (ZOSYN)  IVPB 3.375 g        3.375 g 100 mL/hr over 30 Minutes Intravenous  Once 10/24/19 1721 10/24/19 1810      Assessment/Plan: 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    LOS: 4 days    Susan Miyamoto  MD 10/28/2019

## 2019-10-28 NOTE — Progress Notes (Signed)
PROGRESS NOTE  Susan Todd  CBJ:[id number] DOB: 1965-07-31 DOA: 10/24/2019 PCP: Georgann Housekeeper, MD   Brief Narrative: Susan Todd is a 54 y.o. female with a history of HTN/HLD, anxiety/depression,seizure disorder, COPDwith history of previous diverticular disease who presented 9/2 with abdominal pain up to 9 out of 10 in lower abdomen,  worse with movement and palpation. Pain going on since early July initially with vaginal discharge, seen by her gynecologist and had multiple evaluation and was on different antibiotics subsequently referred to GI and had CT abdomen pelvis most recent one was done 10/23/2019 which showed several abscesses within the abdomen coming from vagina/rectal region with concern for potential perforated appendicitis. She has been on Cipro/Flagyl did not tolerate well and most recently on Augmentin, 8 days prior to admission with minimal improvement. Her GI has referred her to the ED for admission with IR and surgical evaluation. 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  Abscess not drainable at this point, surgery recommended IV antibiotics and pain control.  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 Appendicitis, possible rectal mass, not completely ruled out.  - Continue IVF, diet per general surgery/GI sticking with liquids.  - Continue antibiotics, will DC vancomycin, continue zosyn. Blood cultures NGTD x4 days, surgical MRSA PCR screen negative. Leukocytosis resolved. - Consider flex sigmoidoscopy or colonoscopy in the coming days if acute infectious process resolves. - Gyn has  been consulted in the ED,  awaiting further input. - Continue both PO and IV analgesics.   HTN: BPis stable,  continue to hold homelosartan 25 mg/Lopressor 25 mgbid.  Macrocytic anemia:  - Check anemia panel in AM. Hgb stable, no bleeding. Will monitor CBC.   HLD: - Continue statin  Anxiety/depression/Hx of Seizure disorder:Mood is stable.  - Continuehome medications. No oversedation noted.    COPD: not inexacerbation. - prn's  Tobacco abuse:  - Cessation counseling - Continue nicotine patch.  DVT prophylaxis: Lovenox Code Status: Full Family Communication: None at bedside Disposition Plan:  Status is: Inpatient  Remains inpatient appropriate because:Ongoing active pain requiring inpatient pain management and IV treatments appropriate due to intensity of illness or inability to take PO  Dispo: The patient is from: Home              Anticipated d/c is to: Home              Anticipated d/c date is: 2 days              Patient currently is not medically stable to d/c.  Consultants:   General surgery  GI  Gynecology  Procedures:   None  Antimicrobials:  Vancomycin, zosyn 9/2 >>   Subjective: Has taken 3 doses of oral narcotic in addition to 3 doses of IV morphine since yesterday at bedtime due to severe constant pain worse with movement in the lower abdomen radiating to the right and the back.  Objective: Vitals:   10/27/19 1341 10/27/19 2147 10/28/19 0633 10/28/19 1350  BP: 134/77 (!) 152/93 140/87 124/80  Pulse: 71 71 72 75  Resp: 18 20 20 18   Temp: 97.7 F (36.5 C) 98.5 F (36.9 C) 98.3 F (  36.8 C) (!) 97.5 F (36.4 C)  TempSrc:  Oral Oral Oral  SpO2: 100% 99% 95% 98%  Weight:      Height:        Intake/Output Summary (Last 24 hours) at 10/28/2019 1718 Last data filed at 10/28/2019 1330 Gross per 24 hour  Intake 2380.9 ml  Output --  Net 2380.9 ml   Filed Weights   10/24/19 0751  Weight: 78.5 kg    Gen: 54 y.o. female in no  distress Pulm: Non-labored breathing. Clear to auscultation bilaterally.  CV: Regular rate and rhythm. No murmur, rub, or gallop. No JVD, no pedal edema. GI: Abdomen soft, Tender suprapubic area and RLQ, non-distended, with normoactive bowel sounds. No organomegaly or masses felt. Ext: Warm, no deformities Skin: No rashes, lesions or ulcers Neuro: Alert and oriented. No focal neurological deficits. Psych: Judgement and insight appear normal. Mood & affect appropriate.   Data Reviewed: I have personally reviewed following labs and imaging studies  CBC: Recent Labs  Lab 10/24/19 1945 10/25/19 0313 10/26/19 0300 10/27/19 0327 10/28/19 0418  WBC 11.4* 7.5 6.3 4.6 5.8  HGB 9.5* 8.5* 8.5* 8.8* 9.4*  HCT 30.3* 27.7* 28.0* 29.3* 31.3*  MCV 103.1* 103.7* 104.9* 104.6* 104.0*  PLT 480* 341 371 388 392   Basic Metabolic Panel: Recent Labs  Lab 10/24/19 1154 10/24/19 1154 10/24/19 1945 10/25/19 0313 10/26/19 0300 10/27/19 0327 10/28/19 0418  NA 136  --   --  140 140 143 142  K 3.7  --   --  3.7 3.9 3.9 4.0  CL 102  --   --  106 106 108 107  CO2 25  --   --  23 25 27 26   GLUCOSE 107*  --   --  87 90 95 99  BUN 8  --   --  8 6 <5* 6  CREATININE 0.68   < > 0.86 0.67 0.67 0.67 0.67  CALCIUM 9.1  --   --  8.7* 9.0 9.3 9.4  MG  --   --   --   --   --  1.8 1.7  PHOS  --   --   --   --   --  5.0* 5.0*   < > = values in this interval not displayed.   GFR: Estimated Creatinine Clearance: 86.4 mL/min (by C-G formula based on SCr of 0.67 mg/dL). Liver Function Tests: Recent Labs  Lab 10/24/19 1154 10/25/19 0313 10/28/19 0418  AST 12* 9* 13*  ALT 9 8 10   ALKPHOS 92 74 76  BILITOT 0.6 0.7 0.3  PROT 7.3 5.9* 6.0*  ALBUMIN 3.4* 2.8* 2.7*   No results for input(s): LIPASE, AMYLASE in the last 168 hours. No results for input(s): AMMONIA in the last 168 hours. Coagulation Profile: Recent Labs  Lab 10/25/19 0313  INR 1.0   Cardiac Enzymes: No results for input(s): CKTOTAL,  CKMB, CKMBINDEX, TROPONINI in the last 168 hours. BNP (last 3 results) No results for input(s): PROBNP in the last 8760 hours. HbA1C: No results for input(s): HGBA1C in the last 72 hours. CBG: No results for input(s): GLUCAP in the last 168 hours. Lipid Profile: No results for input(s): CHOL, HDL, LDLCALC, TRIG, CHOLHDL, LDLDIRECT in the last 72 hours. Thyroid Function Tests: No results for input(s): TSH, T4TOTAL, FREET4, T3FREE, THYROIDAB in the last 72 hours. Anemia Panel: No results for input(s): VITAMINB12, FOLATE, FERRITIN, TIBC, IRON, RETICCTPCT in the last 72 hours. Urine analysis:    Component Value  Date/Time   COLORURINE YELLOW 10/24/2019 1822   APPEARANCEUR TURBID (A) 10/24/2019 1822   LABSPEC 1.013 10/24/2019 1822   PHURINE 5.0 10/24/2019 1822   GLUCOSEU NEGATIVE 10/24/2019 1822   HGBUR MODERATE (A) 10/24/2019 1822   BILIRUBINUR NEGATIVE 10/24/2019 1822   KETONESUR 5 (A) 10/24/2019 1822   PROTEINUR 100 (A) 10/24/2019 1822   UROBILINOGEN 0.2 08/23/2006 1144   NITRITE NEGATIVE 10/24/2019 1822   LEUKOCYTESUR LARGE (A) 10/24/2019 1822   Recent Results (from the past 240 hour(s))  SARS Coronavirus 2 by RT PCR (hospital order, performed in [facility] hospital lab) Nasopharyngeal Nasopharyngeal Swab     Status: None   Collection Time: 10/24/19  3:48 PM   Specimen: Nasopharyngeal Swab  Result Value Ref Range Status   SARS Coronavirus 2 NEGATIVE NEGATIVE Final    Comment: (NOTE) SARS-CoV-2 target nucleic acids are NOT DETECTED.  The SARS-CoV-2 RNA is generally detectable in upper and lower respiratory specimens during the acute phase of infection. The lowest concentration of SARS-CoV-2 viral copies this assay can detect is 250 copies / mL. A negative result does not preclude SARS-CoV-2 infection and should not be used as the sole basis for treatment or other patient management decisions.  A negative result may occur with improper specimen collection / handling,  submission of specimen other than nasopharyngeal swab, presence of viral mutation(s) within the areas targeted by this assay, and inadequate number of viral copies (<250 copies / mL). A negative result must be combined with clinical observations, patient history, and epidemiological information.  Fact Sheet for Patients:   BoilerBrush.com.cy  Fact Sheet for Healthcare Providers: https://pope.com/  This test is not yet approved or  cleared by the [place] FDA and has been authorized for detection and/or diagnosis of SARS-CoV-2 by FDA under an Emergency Use Authorization (EUA).  This EUA will remain in effect (meaning this test can be used) for the duration of the COVID-19 declaration under Section 564(b)(1) of the Act, 21 U.S.C. section 360bbb-3(b)(1), unless the authorization is terminated or revoked sooner.  Performed at [facility], [address], [place], [place] [address]   Culture, blood (routine x 2)     Status: None (Preliminary result)   Collection Time: 10/24/19  7:46 PM   Specimen: BLOOD  Result Value Ref Range Status   Specimen Description   Final    BLOOD RIGHT ANTECUBITAL Performed at [facility], [address] [place], [place] [address]    Special Requests   Final    BOTTLES DRAWN AEROBIC AND ANAEROBIC Blood Culture adequate volume Performed at [facility], [address] [place], [place] [address]    Culture   Final    NO GROWTH 4 DAYS Performed at [facility], [address] [place], [place] [address]    Report Status PENDING  Incomplete  Culture, blood (routine x 2)     Status: None (Preliminary result)   Collection Time: 10/24/19  7:52 PM   Specimen: BLOOD  Result Value Ref Range Status   Specimen Description   Final    BLOOD BLOOD RIGHT HAND Performed at [facility], [address] [place], [place] [address]     Special Requests   Final    BOTTLES DRAWN AEROBIC AND ANAEROBIC Blood Culture adequate volume Performed at [facility], [address] [place], [place] [address]    Culture   Final    NO GROWTH 4 DAYS Performed at  [facility], [address], [place], [place] [address]    Report Status PENDING  Incomplete  Surgical PCR screen     Status: None   Collection Time: 10/25/19 12:14 AM   Specimen: Nasal Mucosa; Nasal Swab  Result Value Ref Range Status   MRSA, PCR NEGATIVE NEGATIVE Final   Staphylococcus aureus NEGATIVE NEGATIVE Final    Comment: (NOTE) The Xpert SA Assay (FDA approved for NASAL specimens in patients 17 years of age and older), is one component of a comprehensive surveillance program. It is not intended to diagnose infection nor to guide or monitor treatment. Performed at [facility], [address], [place], [place] [address]       Radiology Studies: No results found.  Scheduled Meds: . aspirin EC  81 mg Oral Daily  . atorvastatin  10 mg Oral QPM  . calcium-vitamin D  1 tablet Oral QHS  . enoxaparin (LOVENOX) injection  40 mg Subcutaneous Q24H  . loratadine  10 mg Oral Daily  . multivitamin with minerals  1 tablet Oral QHS  . nicotine  7 mg Transdermal Daily  . polyethylene glycol  17 g Oral Daily   Continuous Infusions: . lactated ringers    . lactated ringers 75 mL/hr at 10/28/19 0159  . piperacillin-tazobactam (ZOSYN)  IV 3.375 g (10/28/19 1400)  . vancomycin Stopped (10/28/19 0802)     LOS: 4 days   Time spent: 25 minutes.  Tyrone Nine, MD [facility] [email or web address] 10/28/2019, 5:18 PM

## 2019-10-29 LAB — FOLATE: Folate: 19.6 ng/mL (ref >5.9)

## 2019-10-29 LAB — CULTURE, BLOOD (ROUTINE X 2)
Culture: NO GROWTH
Culture: NO GROWTH
Special Requests: ADEQUATE
Special Requests: ADEQUATE

## 2019-10-29 LAB — BASIC METABOLIC PANEL
Anion gap: 7 (ref 5–15)
BUN: 5 mg/dL — ABNORMAL LOW (ref 6–20)
CO2: 27 mmol/L (ref 22–32)
Calcium: 9.1 mg/dL (ref 8.9–10.3)
Chloride: 107 mmol/L (ref 98–111)
Creatinine, Ser: 0.8 mg/dL (ref 0.44–1.00)
GFR calc Af Amer: >60 mL/min (ref >60)
GFR calc non Af Amer: >60 mL/min (ref >60)
Glucose, Bld: 96 mg/dL (ref 70–99)
Potassium: 3.8 mmol/L (ref 3.5–5.1)
Sodium: 141 mmol/L (ref 135–145)

## 2019-10-29 LAB — CBC
HCT: 28.1 % — ABNORMAL LOW (ref 36.0–46.0)
Hemoglobin: 8.5 g/dL — ABNORMAL LOW (ref 12.0–15.0)
MCH: 31.3 pg (ref 26.0–34.0)
MCHC: 30.2 g/dL (ref 30.0–36.0)
MCV: 103.3 fL — ABNORMAL HIGH (ref 80.0–100.0)
Platelets: 325 10*3/uL (ref 150–400)
RBC: 2.72 MIL/uL — ABNORMAL LOW (ref 3.87–5.11)
RDW: 15.1 % (ref 11.5–15.5)
WBC: 4.9 10*3/uL (ref 4.0–10.5)
nRBC: 0 % (ref 0.0–0.2)

## 2019-10-29 LAB — IRON AND TIBC
Iron: 46 ug/dL (ref 28–170)
Saturation Ratios: 20 % (ref 10.4–31.8)
TIBC: 230 ug/dL — ABNORMAL LOW (ref 250–450)
UIBC: 184 ug/dL

## 2019-10-29 LAB — FERRITIN: Ferritin: 129 ng/mL (ref 11–307)

## 2019-10-29 LAB — VITAMIN B12: Vitamin B-12: 1534 pg/mL — ABNORMAL HIGH (ref 180–914)

## 2019-10-29 NOTE — Progress Notes (Signed)
Progress Note  Chief Complaint:    Abdominal pain / pelvic abscess.      ASSESSMENT / PLAN:    Complex right sided intra-abdominal inflammatory / infectious process with abscess. Favor complicated diverticulitis with abscess and colovaginal fistula. Also consider complicated perforated appendicitis is possible. IBD needs to be excluded --Abscess not amenable to IR drainage --On Zosyn day # 5 --Surgery following --Clinically improving. With pain meds her pain is now only 3 out of 10. Vaginal discharge significantly improved.  --Tolerating full liquid diet.  --For eventual sigmoidoscopy, ? Possibly tomorrow. Dr. Barron Alvine to see later.          SUBJECTIVE:   Pain much better. Vaginal discharge better. Tired of full liquid diet    OBJECTIVE:   9/j1/21 CT scan w/ contrast IMPRESSION: 1. 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 This portion of the findings is essentially unchanged compared to the prior study. 2. Enlarging cystic, fluid containing area in the RIGHT adnexa may represent worsening of abscess adjacent to or involving the ovary. Difficult to determine definitively on the current study, reactive ovarian cyst is felt less likely though remains a differential consideration. There is no gas within this area currently. 3. Aortic atherosclerosis.  ADDENDUM: For clarification, the central gas and fluid containing collection the pelvis is an abscess within the central portion of a complex fistulous network. Colonic thickening and commented on in the initial report and remains evident likely due to inflammation. By report, according to the referring provider, the patient may have a rectal mass. Follow-up colonoscopy is suggested for complete evaluation to exclude the possibility of concurrent  neoplasm as outlined in the initial report.  These results were discussed by telephone at the time of interpretation on 10/23/2019 at 5:05 pm to provider [facility] , who verbally acknowledged these results.    Scheduled inpatient medications:  . aspirin EC  81 mg Oral Daily  . atorvastatin  10 mg Oral QPM  . calcium-vitamin D  1 tablet Oral QHS  . enoxaparin (LOVENOX) injection  40 mg Subcutaneous Q24H  . loratadine  10 mg Oral Daily  . multivitamin with minerals  1 tablet Oral QHS  . nicotine  7 mg Transdermal Daily  . polyethylene glycol  17 g Oral Daily   Continuous inpatient infusions:  . lactated ringers    . lactated ringers 75 mL/hr at 10/29/19 0454  . piperacillin-tazobactam (ZOSYN)  IV 3.375 g (10/29/19 0530)   PRN inpatient medications: acetaminophen **OR** acetaminophen, albuterol, gabapentin, HYDROcodone-acetaminophen, LORazepam, morphine injection, ondansetron **OR** ondansetron (ZOFRAN) IV, senna-docusate  Vital signs in last 24 hours: Temp:  [97.5 F (36.4 C)-98.5 F (36.9 C)] 98 F (36.7 C) (09/07 9509) Pulse Rate:  [63-75] 63 (09/07 0623) Resp:  [18] 18 (09/07 0623) BP: (124-151)/(80-90) 139/84 (09/07 0623) SpO2:  [97 %-98 %] 97 % (09/07 0623) Last BM Date: 10/28/19  Intake/Output Summary (Last 24 hours) at 10/29/2019 0851 Last data filed at 10/29/2019 0600 Gross per 24 hour  Intake 3943.42 ml  Output --  Net 3943.42 ml     Physical Exam:  . General: Alert, female in NAD . Heart:  Regular rate and rhythm. No murmur. No lower extremity edema . Pulmonary: Normal respiratory effort . Abdomen: Soft, nondistended, Nontender. Normal bowel sounds. No masses felt. . Neurologic: Alert and oriented . Psych: Pleasant. Cooperative.   Filed Weights  10/24/19 0751  Weight: 78.5 kg    Intake/Output from previous day: 09/06 0701 - 09/07 0700 In: 4143.4 [P.O.:1800; I.V.:1794.8; IV Piggyback:548.6] Out: -  Intake/Output this shift: No intake/output  data recorded.    Lab Results: Recent Labs    10/27/19 0327 10/28/19 0418 10/29/19 0320  WBC 4.6 5.8 4.9  HGB 8.8* 9.4* 8.5*  HCT 29.3* 31.3* 28.1*  PLT 388 392 325   BMET Recent Labs    10/27/19 0327 10/28/19 0418 10/29/19 0320  NA 143 142 141  K 3.9 4.0 3.8  CL 108 107 107  CO2 27 26 27   GLUCOSE 95 99 96  BUN <5* 6 5*  CREATININE 0.67 0.67 0.80  CALCIUM 9.3 9.4 9.1   LFT Recent Labs    10/28/19 0418  PROT 6.0*  ALBUMIN 2.7*  AST 13*  ALT 10  ALKPHOS 76  BILITOT 0.3   PT/INR No results for input(s): LABPROT, INR in the last 72 hours. Hepatitis Panel No results for input(s): HEPBSAG, HCVAB, HEPAIGM, HEPBIGM in the last 72 hours.  No results found.    Principal Problem:   Intra-abdominal abscess (HCC) Active Problems:   Depression   Anxiety   Seizures (HCC)   COPD (chronic obstructive pulmonary disease) (HCC)   Seizure (HCC)     LOS: 5 days   12/28/19 ,NP 10/29/2019, 8:51 AM

## 2019-10-29 NOTE — Plan of Care (Signed)
  Problem: Nutrition: Goal: Adequate nutrition will be maintained Outcome: Progressing   Problem: Pain Managment: Goal: General experience of comfort will improve Outcome: Progressing   

## 2019-10-29 NOTE — H&P (View-Only) (Signed)
Progress Note  Chief Complaint:    Abdominal pain / pelvic abscess.      ASSESSMENT / PLAN:    Complex right sided intra-abdominal inflammatory / infectious process with abscess. Favor complicated diverticulitis with abscess and colovaginal fistula. Also consider complicated perforated appendicitis is possible. IBD needs to be excluded --Abscess not amenable to IR drainage --On Zosyn day # 5 --Surgery following --Clinically improving. With pain meds her pain is now only 3 out of 10. Vaginal discharge significantly improved.  --Tolerating full liquid diet.  --For eventual sigmoidoscopy, ? Possibly tomorrow. Dr. Barron Alvine to see later.          SUBJECTIVE:   Pain much better. Vaginal discharge better. Tired of full liquid diet    OBJECTIVE:   9/j1/21 CT scan w/ contrast IMPRESSION: 1. 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 This portion of the findings is essentially unchanged compared to the prior study. 2. Enlarging cystic, fluid containing area in the RIGHT adnexa may represent worsening of abscess adjacent to or involving the ovary. Difficult to determine definitively on the current study, reactive ovarian cyst is felt less likely though remains a differential consideration. There is no gas within this area currently. 3. Aortic atherosclerosis.  ADDENDUM: For clarification, the central gas and fluid containing collection the pelvis is an abscess within the central portion of a complex fistulous network. Colonic thickening and commented on in the initial report and remains evident likely due to inflammation. By report, according to the referring provider, the patient may have a rectal mass. Follow-up colonoscopy is suggested for complete evaluation to exclude the possibility of concurrent  neoplasm as outlined in the initial report.  These results were discussed by telephone at the time of interpretation on 10/23/2019 at 5:05 pm to provider [facility] , who verbally acknowledged these results.    Scheduled inpatient medications:  . aspirin EC  81 mg Oral Daily  . atorvastatin  10 mg Oral QPM  . calcium-vitamin D  1 tablet Oral QHS  . enoxaparin (LOVENOX) injection  40 mg Subcutaneous Q24H  . loratadine  10 mg Oral Daily  . multivitamin with minerals  1 tablet Oral QHS  . nicotine  7 mg Transdermal Daily  . polyethylene glycol  17 g Oral Daily   Continuous inpatient infusions:  . lactated ringers    . lactated ringers 75 mL/hr at 10/29/19 0454  . piperacillin-tazobactam (ZOSYN)  IV 3.375 g (10/29/19 0530)   PRN inpatient medications: acetaminophen **OR** acetaminophen, albuterol, gabapentin, HYDROcodone-acetaminophen, LORazepam, morphine injection, ondansetron **OR** ondansetron (ZOFRAN) IV, senna-docusate  Vital signs in last 24 hours: Temp:  [97.5 F (36.4 C)-98.5 F (36.9 C)] 98 F (36.7 C) (09/07 9509) Pulse Rate:  [63-75] 63 (09/07 0623) Resp:  [18] 18 (09/07 0623) BP: (124-151)/(80-90) 139/84 (09/07 0623) SpO2:  [97 %-98 %] 97 % (09/07 0623) Last BM Date: 10/28/19  Intake/Output Summary (Last 24 hours) at 10/29/2019 0851 Last data filed at 10/29/2019 0600 Gross per 24 hour  Intake 3943.42 ml  Output --  Net 3943.42 ml     Physical Exam:  . General: Alert, female in NAD . Heart:  Regular rate and rhythm. No murmur. No lower extremity edema . Pulmonary: Normal respiratory effort . Abdomen: Soft, nondistended, Nontender. Normal bowel sounds. No masses felt. . Neurologic: Alert and oriented . Psych: Pleasant. Cooperative.   Filed Weights  10/24/19 0751  Weight: 78.5 kg    Intake/Output from previous day: 09/06 0701 - 09/07 0700 In: 4143.4 [P.O.:1800; I.V.:1794.8; IV Piggyback:548.6] Out: -  Intake/Output this shift: No intake/output  data recorded.    Lab Results: Recent Labs    10/27/19 0327 10/28/19 0418 10/29/19 0320  WBC 4.6 5.8 4.9  HGB 8.8* 9.4* 8.5*  HCT 29.3* 31.3* 28.1*  PLT 388 392 325   BMET Recent Labs    10/27/19 0327 10/28/19 0418 10/29/19 0320  NA 143 142 141  K 3.9 4.0 3.8  CL 108 107 107  CO2 27 26 27  GLUCOSE 95 99 96  BUN <5* 6 5*  CREATININE 0.67 0.67 0.80  CALCIUM 9.3 9.4 9.1   LFT Recent Labs    10/28/19 0418  PROT 6.0*  ALBUMIN 2.7*  AST 13*  ALT 10  ALKPHOS 76  BILITOT 0.3   PT/INR No results for input(s): LABPROT, INR in the last 72 hours. Hepatitis Panel No results for input(s): HEPBSAG, HCVAB, HEPAIGM, HEPBIGM in the last 72 hours.  No results found.    Principal Problem:   Intra-abdominal abscess (HCC) Active Problems:   Depression   Anxiety   Seizures (HCC)   COPD (chronic obstructive pulmonary disease) (HCC)   Seizure (HCC)     LOS: 5 days   Sybrina Laning ,NP 10/29/2019, 8:51 AM       

## 2019-10-29 NOTE — Progress Notes (Signed)
PT Cancellation Note  Patient Details Name: Susan Todd MRN: [id number] DOB: [birth year]   Cancelled Treatment:    Reason Eval/Treat Not Completed: PT screened, no needs identified, will sign off-pt reports ambulating unit without assistance. Pt denies need for PT services.     Faye Ramsay, PT Acute Rehabilitation  Office: [phone] Pager: [phone]

## 2019-10-29 NOTE — Progress Notes (Signed)
PROGRESS NOTE  Susan Todd  VCB:[id number] DOB: 15-Jul-1965 DOA: 10/24/2019 PCP: Georgann Housekeeper, MD   Brief Narrative: Susan Todd is a 54 y.o. female with a history of HTN/HLD, anxiety/depression,seizure disorder, COPDwith history of previous diverticular disease who presented 9/2 with abdominal pain up to 9 out of 10 in lower abdomen,  worse with movement and palpation. Pain going on since early July initially with vaginal discharge, seen by her gynecologist and had multiple evaluation and was on different antibiotics subsequently referred to GI and had CT abdomen pelvis most recent one was done 10/23/2019 which showed several abscesses within the abdomen coming from vagina/rectal region with concern for potential perforated appendicitis. She has been on Cipro/Flagyl did not tolerate well and most recently on Augmentin, 8 days prior to admission with minimal improvement. Her GI has referred her to the ED for admission with IR and surgical evaluation. 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  Abscess not drainable at this point, surgery recommended IV antibiotics and pain control. Flexible sigmoidoscopy is planned for 9/8.  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 Appendicitis, possible rectal mass, not completely ruled out.  - Continue IVF, diet per general surgery/GI sticking with liquids.  - Continue antibiotics, zosyn. Blood cultures NGTD x5 days, surgical MRSA PCR screen negative. Leukocytosis resolved. - GI planning flex sig 9/8. - Gyn has been consulted in the ED,  awaiting further input. -  Continue both PO and IV analgesics.   HTN: BPis stable,  continue to hold homelosartan 25 mg/Lopressor 25 mgbid.  Macrocytic anemia: Folic acid, vitamin B12, and ferritin and iron are wnl. - Monitor CBC.   HLD: - Continue statin  Anxiety/depression/Hx of Seizure disorder:Mood is stable.  - Continuehome medications. No oversedation noted.  COPD: not inexacerbation. - prn's  Tobacco abuse:  - Cessation counseling - Continue nicotine patch.  DVT prophylaxis: Lovenox Code Status: Full Family Communication: None at bedside Disposition Plan:  Status is: Inpatient  Remains inpatient appropriate because:Ongoing active pain requiring inpatient pain management and IV treatments appropriate due to intensity of illness or inability to take PO  Dispo: The patient is from: Home              Anticipated d/c is to: Home              Anticipated d/c date is: 2 days              Patient currently is not medically stable to d/c.  Consultants:   General surgery  GI  Gynecology  Procedures:   None  Antimicrobials:  Vancomycin, zosyn 9/2 >>   Subjective: Continues to require significant narcotic pain medications, though pain is generally improving. Tolerating liquids. No vomiting or fevers.  Objective: Vitals:   10/28/19 1350 10/28/19 2049 10/29/19 0623 10/29/19 1341  BP: 124/80 (!) 151/90 139/84 (!) 140/94  Pulse: 75 68 63 75  Resp: 18 18 18 14   Temp: (!) 97.5 F (36.4 C) 98.5 F (36.9 C) 98 F (36.7 C) 97.7 F (36.5 C)  TempSrc: Oral Oral Oral Oral  SpO2: 98% 98% 97% 98%  Weight:      Height:  Intake/Output Summary (Last 24 hours) at 10/29/2019 1649 Last data filed at 10/29/2019 1400 Gross per 24 hour  Intake 3062.71 ml  Output 0 ml  Net 3062.71 ml   Filed Weights   10/24/19 0751  Weight: 78.5 kg   Gen: 54 y.o. female in no distress Pulm: Nonlabored breathing room air. Clear. CV: Regular rate and rhythm. No murmur, rub, or gallop. No JVD,  no dependent edema. GI: Abdomen soft, tender in lower quadrants centrally and on the right without rebound, non-distended, with normoactive bowel sounds.  Ext: Warm, no deformities Skin: No rashes, lesions or ulcers on visualized skin. Neuro: Alert and oriented. No focal neurological deficits. Psych: Judgement and insight appear fair. Mood euthymic & affect congruent. Behavior is appropriate.    Data Reviewed: I have personally reviewed following labs and imaging studies  CBC: Recent Labs  Lab 10/25/19 0313 10/26/19 0300 10/27/19 0327 10/28/19 0418 10/29/19 0320  WBC 7.5 6.3 4.6 5.8 4.9  HGB 8.5* 8.5* 8.8* 9.4* 8.5*  HCT 27.7* 28.0* 29.3* 31.3* 28.1*  MCV 103.7* 104.9* 104.6* 104.0* 103.3*  PLT 341 371 388 392 325   Basic Metabolic Panel: Recent Labs  Lab 10/25/19 0313 10/26/19 0300 10/27/19 0327 10/28/19 0418 10/29/19 0320  NA 140 140 143 142 141  K 3.7 3.9 3.9 4.0 3.8  CL 106 106 108 107 107  CO2 23 25 27 26 27   GLUCOSE 87 90 95 99 96  BUN 8 6 <5* 6 5*  CREATININE 0.67 0.67 0.67 0.67 0.80  CALCIUM 8.7* 9.0 9.3 9.4 9.1  MG  --   --  1.8 1.7  --   PHOS  --   --  5.0* 5.0*  --    GFR: Estimated Creatinine Clearance: 86.4 mL/min (by C-G formula based on SCr of 0.8 mg/dL). Liver Function Tests: Recent Labs  Lab 10/24/19 1154 10/25/19 0313 10/28/19 0418  AST 12* 9* 13*  ALT 9 8 10   ALKPHOS 92 74 76  BILITOT 0.6 0.7 0.3  PROT 7.3 5.9* 6.0*  ALBUMIN 3.4* 2.8* 2.7*   No results for input(s): LIPASE, AMYLASE in the last 168 hours. No results for input(s): AMMONIA in the last 168 hours. Coagulation Profile: Recent Labs  Lab 10/25/19 0313  INR 1.0   Cardiac Enzymes: No results for input(s): CKTOTAL, CKMB, CKMBINDEX, TROPONINI in the last 168 hours. BNP (last 3 results) No results for input(s): PROBNP in the last 8760 hours. HbA1C: No results for input(s): HGBA1C in the last 72 hours. CBG: No results for input(s): GLUCAP in the last 168 hours. Lipid  Profile: No results for input(s): CHOL, HDL, LDLCALC, TRIG, CHOLHDL, LDLDIRECT in the last 72 hours. Thyroid Function Tests: No results for input(s): TSH, T4TOTAL, FREET4, T3FREE, THYROIDAB in the last 72 hours. Anemia Panel: Recent Labs    10/29/19 0320  VITAMINB12 1,534*  FOLATE 19.6  FERRITIN 129  TIBC 230*  IRON 46   Urine analysis:    Component Value Date/Time   COLORURINE YELLOW 10/24/2019 1822   APPEARANCEUR TURBID (A) 10/24/2019 1822   LABSPEC 1.013 10/24/2019 1822   PHURINE 5.0 10/24/2019 1822   GLUCOSEU NEGATIVE 10/24/2019 1822   HGBUR MODERATE (A) 10/24/2019 1822   BILIRUBINUR NEGATIVE 10/24/2019 1822   KETONESUR 5 (A) 10/24/2019 1822   PROTEINUR 100 (A) 10/24/2019 1822   UROBILINOGEN 0.2 08/23/2006 1144   NITRITE NEGATIVE 10/24/2019 1822   LEUKOCYTESUR LARGE (A) 10/24/2019 1822   Recent Results (from the past 240 hour(s))  SARS Coronavirus 2  by RT PCR (hospital order, performed in [facility] hospital lab) Nasopharyngeal Nasopharyngeal Swab     Status: None   Collection Time: 10/24/19  3:48 PM   Specimen: Nasopharyngeal Swab  Result Value Ref Range Status   SARS Coronavirus 2 NEGATIVE NEGATIVE Final    Comment: (NOTE) SARS-CoV-2 target nucleic acids are NOT DETECTED.  The SARS-CoV-2 RNA is generally detectable in upper and lower respiratory specimens during the acute phase of infection. The lowest concentration of SARS-CoV-2 viral copies this assay can detect is 250 copies / mL. A negative result does not preclude SARS-CoV-2 infection and should not be used as the sole basis for treatment or other patient management decisions.  A negative result may occur with improper specimen collection / handling, submission of specimen other than nasopharyngeal swab, presence of viral mutation(s) within the areas targeted by this assay, and inadequate number of viral copies (<250 copies / mL). A negative result must be combined with clinical observations, patient  history, and epidemiological information.  Fact Sheet for Patients:   BoilerBrush.com.cy  Fact Sheet for Healthcare Providers: https://pope.com/  This test is not yet approved or  cleared by the [place] FDA and has been authorized for detection and/or diagnosis of SARS-CoV-2 by FDA under an Emergency Use Authorization (EUA).  This EUA will remain in effect (meaning this test can be used) for the duration of the COVID-19 declaration under Section 564(b)(1) of the Act, 21 U.S.C. section 360bbb-3(b)(1), unless the authorization is terminated or revoked sooner.  Performed at [facility], [address], [place], [place] [address]   Culture, blood (routine x 2)     Status: None   Collection Time: 10/24/19  7:46 PM   Specimen: BLOOD  Result Value Ref Range Status   Specimen Description   Final    BLOOD RIGHT ANTECUBITAL Performed at [facility], [address] [place], [place] [address]    Special Requests   Final    BOTTLES DRAWN AEROBIC AND ANAEROBIC Blood Culture adequate volume Performed at [facility], [address], [place], [place] [address]    Culture   Final    NO GROWTH 5 DAYS Performed at [facility], [address] [place], [place] [address]    Report Status 10/29/2019 FINAL  Final  Culture, blood (routine x 2)     Status: None   Collection Time: 10/24/19  7:52 PM   Specimen: BLOOD  Result Value Ref Range Status   Specimen Description   Final    BLOOD BLOOD RIGHT HAND Performed at [facility], [address] [place], [place] [address]    Special Requests   Final    BOTTLES DRAWN AEROBIC AND ANAEROBIC Blood Culture adequate volume Performed at [facility], [address] [place], [place] [address]    Culture   Final    NO GROWTH 5 DAYS Performed at [facility], [address],  [place], [place] [address]    Report Status 10/29/2019 FINAL  Final  Surgical PCR screen     Status: None   Collection Time: 10/25/19 12:14 AM   Specimen: Nasal Mucosa; Nasal Swab  Result Value Ref Range Status   MRSA, PCR NEGATIVE NEGATIVE Final   Staphylococcus aureus NEGATIVE NEGATIVE Final    Comment: (NOTE) The Xpert SA Assay (FDA approved for NASAL specimens in patients 68 years of age and older), is one component of a comprehensive surveillance program.  It is not intended to diagnose infection nor to guide or monitor treatment. Performed at [facility], [address], [place], [place] [address]       Radiology Studies: No results found.  Scheduled Meds: . aspirin EC  81 mg Oral Daily  . atorvastatin  10 mg Oral QPM  . calcium-vitamin D  1 tablet Oral QHS  . enoxaparin (LOVENOX) injection  40 mg Subcutaneous Q24H  . loratadine  10 mg Oral Daily  . multivitamin with minerals  1 tablet Oral QHS  . nicotine  7 mg Transdermal Daily  . polyethylene glycol  17 g Oral Daily   Continuous Infusions: . lactated ringers    . lactated ringers 75 mL/hr at 10/29/19 0454  . piperacillin-tazobactam (ZOSYN)  IV 3.375 g (10/29/19 1401)     LOS: 5 days   Time spent: 25 minutes.  Tyrone Nine, MD [facility] [email or web address] 10/29/2019, 4:49 PM

## 2019-10-29 NOTE — Progress Notes (Signed)
   Subjective/Chief Complaint: Lower abdominal pain improving, but still present. BMx2 yesterday. Ongoing gas and brown vaginal discharge. Denies fever, chills, night sweats.  Objective: Vital signs in last 24 hours: Temp:  [97.5 F (36.4 C)-98.5 F (36.9 C)] 98 F (36.7 C) (09/07 0623) Pulse Rate:  [63-75] 63 (09/07 0623) Resp:  [18] 18 (09/07 0623) BP: (124-151)/(80-90) 139/84 (09/07 0623) SpO2:  [97 %-98 %] 97 % (09/07 0623) Last BM Date: 10/28/19  Intake/Output from previous day: 09/06 0701 - 09/07 0700 In: 4143.4 [P.O.:1800; I.V.:1794.8; IV Piggyback:548.6] Out: -  Intake/Output this shift: No intake/output data recorded.  Exam: Awake and alert Abdomen soft, minimally tender lower abdomen, no guarding, +BS Mobilizing in her room  Lab Results:  Recent Labs    10/28/19 0418 10/29/19 0320  WBC 5.8 4.9  HGB 9.4* 8.5*  HCT 31.3* 28.1*  PLT 392 325   BMET Recent Labs    10/28/19 0418 10/29/19 0320  NA 142 141  K 4.0 3.8  CL 107 107  CO2 26 27  GLUCOSE 99 96  BUN 6 5*  CREATININE 0.67 0.80  CALCIUM 9.4 9.1   PT/INR No results for input(s): LABPROT, INR in the last 72 hours. ABG No results for input(s): PHART, HCO3 in the last 72 hours.  Invalid input(s): PCO2, PO2  Studies/Results: No results found.  Anti-infectives: Anti-infectives (From admission, onward)   Start     Dose/Rate Route Frequency Ordered Stop   10/25/19 1000  vancomycin (VANCOCIN) IVPB 1000 mg/200 mL premix  Status:  Discontinued        1,000 mg 200 mL/hr over 60 Minutes Intravenous Every 12 hours 10/24/19 1835 10/28/19 1727   10/24/19 2300  piperacillin-tazobactam (ZOSYN) IVPB 3.375 g        3.375 g 12.5 mL/hr over 240 Minutes Intravenous Every 8 hours 10/24/19 1835     10/24/19 2000  vancomycin (VANCOREADY) IVPB 1500 mg/300 mL  Status:  Discontinued        1,500 mg 150 mL/hr over 120 Minutes Intravenous  Once 10/24/19 1835 10/24/19 1836   10/24/19 2000  vancomycin (VANCOREADY)  IVPB 1500 mg/300 mL        1,500 mg 150 mL/hr over 120 Minutes Intravenous  Once 10/24/19 1836 10/24/19 2229   10/24/19 1730  piperacillin-tazobactam (ZOSYN) IVPB 3.375 g        3.375 g 100 mL/hr over 30 Minutes Intravenous  Once 10/24/19 1721 10/24/19 1810      Assessment/Plan: 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 We will follow    LOS: 5 days    Adam Phenix  MD 10/29/2019

## 2019-10-30 ENCOUNTER — Encounter (HOSPITAL_COMMUNITY): Payer: Self-pay | Admitting: Internal Medicine

## 2019-10-30 ENCOUNTER — Inpatient Hospital Stay (HOSPITAL_COMMUNITY): Payer: Self-pay | Admitting: Certified Registered Nurse Anesthetist

## 2019-10-30 ENCOUNTER — Encounter (HOSPITAL_COMMUNITY)
Admission: EM | Disposition: A | Discharge status: Home Health | Payer: Self-pay | Source: Home / Self Care | Attending: Internal Medicine

## 2019-10-30 DIAGNOSIS — K56699 Other intestinal obstruction unspecified as to partial versus complete obstruction: Secondary | ICD-10-CM

## 2019-10-30 DIAGNOSIS — K5730 Diverticulosis of large intestine without perforation or abscess without bleeding: Secondary | ICD-10-CM

## 2019-10-30 DIAGNOSIS — F419 Anxiety disorder, unspecified: Secondary | ICD-10-CM

## 2019-10-30 HISTORY — PX: BIOPSY: SHX5522

## 2019-10-30 HISTORY — PX: FLEXIBLE SIGMOIDOSCOPY: SHX5431

## 2019-10-30 LAB — BASIC METABOLIC PANEL
Anion gap: 9 (ref 5–15)
BUN: <5 mg/dL — ABNORMAL LOW (ref 6–20)
CO2: 23 mmol/L (ref 22–32)
Calcium: 8.9 mg/dL (ref 8.9–10.3)
Chloride: 108 mmol/L (ref 98–111)
Creatinine, Ser: 0.7 mg/dL (ref 0.44–1.00)
GFR calc Af Amer: >60 mL/min (ref >60)
GFR calc non Af Amer: >60 mL/min (ref >60)
Glucose, Bld: 84 mg/dL (ref 70–99)
Potassium: 3.7 mmol/L (ref 3.5–5.1)
Sodium: 140 mmol/L (ref 135–145)

## 2019-10-30 LAB — CBC
HCT: 30.3 % — ABNORMAL LOW (ref 36.0–46.0)
Hemoglobin: 9.3 g/dL — ABNORMAL LOW (ref 12.0–15.0)
MCH: 31.5 pg (ref 26.0–34.0)
MCHC: 30.7 g/dL (ref 30.0–36.0)
MCV: 102.7 fL — ABNORMAL HIGH (ref 80.0–100.0)
Platelets: 326 10*3/uL (ref 150–400)
RBC: 2.95 MIL/uL — ABNORMAL LOW (ref 3.87–5.11)
RDW: 15.1 % (ref 11.5–15.5)
WBC: 6.5 10*3/uL (ref 4.0–10.5)
nRBC: 0 % (ref 0.0–0.2)

## 2019-10-30 SURGERY — SIGMOIDOSCOPY, FLEXIBLE
Anesthesia: Monitor Anesthesia Care

## 2019-10-30 MED ORDER — PROPOFOL 500 MG/50ML IV EMUL
INTRAVENOUS | Status: AC
  Filled 2019-10-30: qty 50

## 2019-10-30 MED ORDER — PROPOFOL 500 MG/50ML IV EMUL
INTRAVENOUS | Status: DC | PRN
  Administered 2019-10-30: 100 ug/kg/min via INTRAVENOUS

## 2019-10-30 MED ORDER — PROPOFOL 10 MG/ML IV BOLUS
INTRAVENOUS | Status: DC | PRN
  Administered 2019-10-30: 20 mg via INTRAVENOUS
  Administered 2019-10-30: 50 mg via INTRAVENOUS

## 2019-10-30 NOTE — Interval H&P Note (Signed)
History and Physical Interval Note:  10/30/2019 12:37 PM  Susan Todd  has presented today for surgery, with the diagnosis of abnormal CT scan.  The various methods of treatment have been discussed with the patient and family. After consideration of risks, benefits and other options for treatment, the patient has consented to  Procedure(s): FLEXIBLE SIGMOIDOSCOPY (N/A) as a surgical intervention.  The patient's history has been reviewed, patient examined, no change in status, stable for surgery.  I have reviewed the patient's chart and labs.  Questions were answered to the patient's satisfaction.     [employer]

## 2019-10-30 NOTE — Progress Notes (Addendum)
Day of Surgery    XB:[id number] pain  Subjective: She still having abdominal pain, mostly in the lower abdomen going to her right flank, and back.  She says yesterday feels better since she came in.  She has been on liquids and has not anything to eat.  She is scheduled for flex sig later today.  Objective: Vital signs in last 24 hours: Temp:  [97.7 F (36.5 C)-98.7 F (37.1 C)] 98.7 F (37.1 C) (09/08 0550) Pulse Rate:  [73-75] 73 (09/08 0550) Resp:  [14-20] 16 (09/08 0550) BP: (140-143)/(84-99) 143/84 (09/08 0550) SpO2:  [96 %-98 %] 96 % (09/08 0550) Last BM Date: 10/28/19 600 p.o. recorded 1750 IV Urine x4 No BM recorded Afebrile blood pressure slightly elevated.  Vital signs are stable .  BMP is normal. 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  Unchanged from prior study. Intake/Output from previous day: 09/07 0701 - 09/08 0700 In: 2343.5 [P.O.:600; I.V.:1693.5; IV Piggyback:50] Out: 0  Intake/Output this shift: No intake/output data recorded.  General appearance: alert, cooperative and no distress Resp: clear to auscultation bilaterally GI: She complains of pain lower abdomen, going to her right side and back.  She is soft slightly uncomfortable with palpation of her lower abdomen.  Positive bowel sounds.  Lab Results:  Recent Labs    10/29/19 0320 10/30/19 0347  WBC 4.9 6.5  HGB 8.5* 9.3*  HCT 28.1* 30.3*  PLT 325 326    BMET Recent Labs    10/29/19 0320 10/30/19 0347  NA 141 140  K 3.8 3.7  CL 107 108  CO2 27 23  GLUCOSE 96 84  BUN 5* <5*  CREATININE 0.80 0.70  CALCIUM 9.1 8.9   PT/INR No results for input(s): LABPROT, INR in the last 72 hours.  Recent Labs  Lab 10/24/19 1154 10/25/19 0313 10/28/19 0418  AST 12* 9* 13*   ALT 9 8 10   ALKPHOS 92 74 76  BILITOT 0.6 0.7 0.3  PROT 7.3 5.9* 6.0*  ALBUMIN 3.4* 2.8* 2.7*     Lipase     Component Value Date/Time   LIPASE 18 09/19/2019 1459     Medications: . aspirin EC  81 mg Oral Daily  . atorvastatin  10 mg Oral QPM  . calcium-vitamin D  1 tablet Oral QHS  . enoxaparin (LOVENOX) injection  40 mg Subcutaneous Q24H  . loratadine  10 mg Oral Daily  . multivitamin with minerals  1 tablet Oral QHS  . nicotine  7 mg Transdermal Daily  . polyethylene glycol  17 g Oral Daily   . lactated ringers    . lactated ringers 75 mL/hr at 10/30/19 0555  . 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 ID: Zosyn 9/2  >> day 7 DVT: Lovenox Follow-up: TBD  Plan: Continue antibiotics, she is scheduled for flexible sigmoidoscopy later today.  We will review findings with Dr. 11/2 who she is seen in the office, and developed a treatment plan after completion of the flexible sigmoidoscopy.   LOS: 6 days    Susan Todd 10/30/2019 Please see Amion

## 2019-10-30 NOTE — Anesthesia Postprocedure Evaluation (Signed)
Anesthesia Post Note  Patient: Susan Todd  Procedure(s) Performed: FLEXIBLE SIGMOIDOSCOPY (N/A ) BIOPSY     Patient location during evaluation: PACU Anesthesia Type: MAC Level of consciousness: awake and alert Pain management: pain level controlled Vital Signs Assessment: post-procedure vital signs reviewed and stable Respiratory status: spontaneous breathing, nonlabored ventilation, respiratory function stable and patient connected to nasal cannula oxygen Cardiovascular status: stable and blood pressure returned to baseline Postop Assessment: no apparent nausea or vomiting Anesthetic complications: no   No complications documented.  Last Vitals:  Vitals:   10/30/19 1330 10/30/19 1340  BP: (!) 147/82 (!) 152/90  Pulse: 96 79  Resp: (!) 25 15  Temp:    SpO2: 98% 96%    Last Pain:  Vitals:   10/30/19 1514  TempSrc:   PainSc: 4                  Tiajuana Amass

## 2019-10-30 NOTE — Anesthesia Procedure Notes (Signed)
Procedure Name: MAC Date/Time: 10/30/2019 12:43 PM Performed by: Deliah Boston, CRNA Pre-anesthesia Checklist: Patient identified, Emergency Drugs available, Suction available and Patient being monitored Patient Re-evaluated:Patient Re-evaluated prior to induction Oxygen Delivery Method: Simple face mask Preoxygenation: Pre-oxygenation with 100% oxygen Placement Confirmation: positive ETCO2 and breath sounds checked- equal and bilateral

## 2019-10-30 NOTE — Progress Notes (Signed)
Patient ID: Susan Todd, female   DOB: 1965-09-17, 54 y.o.   MRN: [id number]  PROGRESS NOTE    Susan Todd  VEH:[id number] DOB: 1965/07/11 DOA: 10/24/2019 PCP: Susan Housekeeper, MD   Brief Narrative:  54 y.o. female with a history of HTN/HLD, anxiety/depression,seizure disorder, COPDwith history of previous diverticular disease who presented on 10/24/19 with worsening abdominal pain. Pain going on since early July initially with vaginal discharge, seen by her gynecologist and had multiple evaluation and was on different antibiotics subsequently referred to GI. CT of the abdomen and pelvis done on 10/23/2019 showed several abscesses within the abdomen coming from vagina/rectal region with concern for potential perforated appendicitis. 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 She was started on broad-spectrum antibiotics. GI and general surgery were consulted.  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 Appendicitis, possible rectal mass not completely ruled out -Currently on Zosyn. GI and general surgery following. Plan for flexible sigmoidoscopy today. Cultures negative so far. Leukocytosis is resolved.  Hypertension -Blood pressure stable. Home losartan and metoprolol on hold  Macrocytic anemia -We will get it, vitamin B12 and ferritin and iron within normal limit. Monitor CBC intermittently  Hyperlipidemia -Continue statin  Anxiety/depression/history of seizure disorder -Outpatient follow-up  COPD -Stable.   Tobacco abuse -Patient was counseled regarding cessation by prior hospitalist. Continue nicotine patch   DVT prophylaxis: Lovenox Code Status: Full Family Communication: Patient at bedside Disposition Plan: Status is: Inpatient  Remains inpatient appropriate because:Inpatient level of care  appropriate due to severity of illness   Dispo: The patient is from: Home              Anticipated d/c is to: Home              Anticipated d/c date is: 2 days              Patient currently is not medically stable to d/c.   Consultants: GI/general surgery  Procedures: None  Antimicrobials:  Anti-infectives (From admission, onward)   Start     Dose/Rate Route Frequency Ordered Stop   10/25/19 1000  vancomycin (VANCOCIN) IVPB 1000 mg/200 mL premix  Status:  Discontinued        1,000 mg 200 mL/hr over 60 Minutes Intravenous Every 12 hours 10/24/19 1835 10/28/19 1727   10/24/19 2300  [MAR Hold]  piperacillin-tazobactam (ZOSYN) IVPB 3.375 g        (MAR Hold since Wed 10/30/2019 at 1139.Hold Reason: Transfer to a Procedural area.)   3.375 g 12.5 mL/hr over 240 Minutes Intravenous Every 8 hours 10/24/19 1835     10/24/19 2000  vancomycin (VANCOREADY) IVPB 1500 mg/300 mL  Status:  Discontinued        1,500 mg 150 mL/hr over 120 Minutes Intravenous  Once 10/24/19 1835 10/24/19 1836   10/24/19 2000  vancomycin (VANCOREADY) IVPB 1500 mg/300 mL        1,500 mg 150 mL/hr over 120 Minutes Intravenous  Once 10/24/19 1836 10/24/19 2229   10/24/19 1730  piperacillin-tazobactam (ZOSYN) IVPB 3.375 g        3.375 g 100 mL/hr over 30 Minutes Intravenous  Once 10/24/19 1721 10/24/19 1810       Subjective: Patient seen and examined at bedside. Complains of lower abdominal pain and some nausea. No overnight fever or vomiting reported. Still having vaginal discharge.  Objective: Vitals:   10/29/19  2117 10/30/19 0550 10/30/19 1143 10/30/19 1326  BP: (!) 142/99 (!) 143/84 (!) 148/86 122/84  Pulse: 75 73 81 88  Resp: 20 16 16 12   Temp: 98.3 F (36.8 C) 98.7 F (37.1 C) 98.9 F (37.2 C) 99.5 F (37.5 C)  TempSrc: Oral Oral Axillary Axillary  SpO2: 97% 96% 95% 98%  Weight:      Height:        Intake/Output Summary (Last 24 hours) at 10/30/2019 1339 Last data filed at 10/30/2019 1327 Gross per  24 hour  Intake 2703.52 ml  Output 0 ml  Net 2703.52 ml   Filed Weights   10/24/19 0751  Weight: 78.5 kg    Examination:  General exam: Appears calm and comfortable  Respiratory system: Bilateral decreased breath sounds at bases Cardiovascular system: S1 & S2 heard, Rate controlled Gastrointestinal system: Abdomen is nondistended, soft and lower quadrant tenderness present. Normal bowel sounds heard. Extremities: No cyanosis, clubbing, edema  Central nervous system: Alert and oriented. No focal neurological deficits. Moving extremities Skin: No rashes, lesions or ulcers Psychiatry: Judgement and insight appear normal. Mood & affect appropriate.     Data Reviewed: I have personally reviewed following labs and imaging studies  CBC: Recent Labs  Lab 10/26/19 0300 10/27/19 0327 10/28/19 0418 10/29/19 0320 10/30/19 0347  WBC 6.3 4.6 5.8 4.9 6.5  HGB 8.5* 8.8* 9.4* 8.5* 9.3*  HCT 28.0* 29.3* 31.3* 28.1* 30.3*  MCV 104.9* 104.6* 104.0* 103.3* 102.7*  PLT 371 388 392 325 326   Basic Metabolic Panel: Recent Labs  Lab 10/26/19 0300 10/27/19 0327 10/28/19 0418 10/29/19 0320 10/30/19 0347  NA 140 143 142 141 140  K 3.9 3.9 4.0 3.8 3.7  CL 106 108 107 107 108  CO2 25 27 26 27 23   GLUCOSE 90 95 99 96 84  BUN 6 <5* 6 5* <5*  CREATININE 0.67 0.67 0.67 0.80 0.70  CALCIUM 9.0 9.3 9.4 9.1 8.9  MG  --  1.8 1.7  --   --   PHOS  --  5.0* 5.0*  --   --    GFR: Estimated Creatinine Clearance: 86.4 mL/min (by C-G formula based on SCr of 0.7 mg/dL). Liver Function Tests: Recent Labs  Lab 10/24/19 1154 10/25/19 0313 10/28/19 0418  AST 12* 9* 13*  ALT 9 8 10   ALKPHOS 92 74 76  BILITOT 0.6 0.7 0.3  PROT 7.3 5.9* 6.0*  ALBUMIN 3.4* 2.8* 2.7*   No results for input(s): LIPASE, AMYLASE in the last 168 hours. No results for input(s): AMMONIA in the last 168 hours. Coagulation Profile: Recent Labs  Lab 10/25/19 0313  INR 1.0   Cardiac Enzymes: No results for input(s):  CKTOTAL, CKMB, CKMBINDEX, TROPONINI in the last 168 hours. BNP (last 3 results) No results for input(s): PROBNP in the last 8760 hours. HbA1C: No results for input(s): HGBA1C in the last 72 hours. CBG: No results for input(s): GLUCAP in the last 168 hours. Lipid Profile: No results for input(s): CHOL, HDL, LDLCALC, TRIG, CHOLHDL, LDLDIRECT in the last 72 hours. Thyroid Function Tests: No results for input(s): TSH, T4TOTAL, FREET4, T3FREE, THYROIDAB in the last 72 hours. Anemia Panel: Recent Labs    10/29/19 0320  VITAMINB12 1,534*  FOLATE 19.6  FERRITIN 129  TIBC 230*  IRON 46   Sepsis Labs: Recent Labs  Lab 10/24/19 1620 10/24/19 1945 10/25/19 0313  PROCALCITON  --   --  <0.10  LATICACIDVEN 1.7 1.1  --     Recent  Results (from the past 240 hour(s))  SARS Coronavirus 2 by RT PCR (hospital order, performed in [facility] hospital lab) Nasopharyngeal Nasopharyngeal Swab     Status: None   Collection Time: 10/24/19  3:48 PM   Specimen: Nasopharyngeal Swab  Result Value Ref Range Status   SARS Coronavirus 2 NEGATIVE NEGATIVE Final    Comment: (NOTE) SARS-CoV-2 target nucleic acids are NOT DETECTED.  The SARS-CoV-2 RNA is generally detectable in upper and lower respiratory specimens during the acute phase of infection. The lowest concentration of SARS-CoV-2 viral copies this assay can detect is 250 copies / mL. A negative result does not preclude SARS-CoV-2 infection and should not be used as the sole basis for treatment or other patient management decisions.  A negative result may occur with improper specimen collection / handling, submission of specimen other than nasopharyngeal swab, presence of viral mutation(s) within the areas targeted by this assay, and inadequate number of viral copies (<250 copies / mL). A negative result must be combined with clinical observations, patient history, and epidemiological information.  Fact Sheet for Patients:    BoilerBrush.com.cyhttps://www.fda.gov/media/136312/download  Fact Sheet for Healthcare Providers: https://pope.com/https://www.fda.gov/media/136313/download  This test is not yet approved or  cleared by the [place] FDA and has been authorized for detection and/or diagnosis of SARS-CoV-2 by FDA under an Emergency Use Authorization (EUA).  This EUA will remain in effect (meaning this test can be used) for the duration of the COVID-19 declaration under Section 564(b)(1) of the Act, 21 U.S.C. section 360bbb-3(b)(1), unless the authorization is terminated or revoked sooner.  Performed at [facility], [address] [place], [place] [address]   Culture, blood (routine x 2)     Status: None   Collection Time: 10/24/19  7:46 PM   Specimen: BLOOD  Result Value Ref Range Status   Specimen Description   Final    BLOOD RIGHT ANTECUBITAL Performed at [facility], [address] [place], [place] [address]    Special Requests   Final    BOTTLES DRAWN AEROBIC AND ANAEROBIC Blood Culture adequate volume Performed at [facility], [address] [place], [place] [address]    Culture   Final    NO GROWTH 5 DAYS Performed at [facility], [address] [place], [place] [address]    Report Status 10/29/2019 FINAL  Final  Culture, blood (routine x 2)     Status: None   Collection Time: 10/24/19  7:52 PM   Specimen: BLOOD  Result Value Ref Range Status   Specimen Description   Final    BLOOD BLOOD RIGHT HAND Performed at [facility], [address] [place], [place] [address]    Special Requests   Final    BOTTLES DRAWN AEROBIC AND ANAEROBIC Blood Culture adequate volume Performed at [facility], [address] [place], [place] [address]    Culture   Final    NO GROWTH 5 DAYS Performed at [facility], [address] [place], [place] [address]    Report Status 10/29/2019 FINAL  Final  Surgical PCR  screen     Status: None   Collection Time: 10/25/19 12:14 AM   Specimen: Nasal Mucosa; Nasal Swab  Result Value Ref Range Status   MRSA, PCR NEGATIVE NEGATIVE Final   Staphylococcus aureus NEGATIVE NEGATIVE Final    Comment: (NOTE) The Xpert SA Assay (FDA approved for NASAL specimens in patients 56 years of age  and older), is one component of a comprehensive surveillance program. It is not intended to diagnose infection nor to guide or monitor treatment. Performed at [facility], [address] [place], [place] [address]          Radiology Studies: No results found.      Scheduled Meds: . [MAR Hold] aspirin EC  81 mg Oral Daily  . [MAR Hold] atorvastatin  10 mg Oral QPM  . [MAR Hold] calcium-vitamin D  1 tablet Oral QHS  . [MAR Hold] enoxaparin (LOVENOX) injection  40 mg Subcutaneous Q24H  . [MAR Hold] loratadine  10 mg Oral Daily  . [MAR Hold] multivitamin with minerals  1 tablet Oral QHS  . [MAR Hold] nicotine  7 mg Transdermal Daily  . [MAR Hold] polyethylene glycol  17 g Oral Daily   Continuous Infusions: . [MAR Hold] lactated ringers    . lactated ringers 0 mL (10/30/19 1142)  . [MAR Hold] piperacillin-tazobactam (ZOSYN)  IV 3.375 g (10/30/19 0556)          Glade Lloyd, MD [facility] 10/30/2019, 1:39 PM

## 2019-10-30 NOTE — Op Note (Signed)
[facility] Patient Name: Susan Todd Procedure Date: 10/30/2019 MRN: [id number] Attending MD: Justice Britain , MD Date of Birth: 11/02/1965 CSN: [id number] Age: 54 Admit Type: Inpatient Procedure:                Flexible Sigmoidoscopy Indications:              Abnormal CT of the GI tract, Preoperative                            assessment, Lower abdominal pain, Change in stool                            caliber Providers:                Justice Britain, MD, Cleda Daub, RN, Fransico Setters                            [place], Technician Referring MD:             [facility] Medicines:                Monitored Anesthesia Care Complications:            No immediate complications. Estimated Blood Loss:     Estimated blood loss was minimal. Procedure:                Pre-Anesthesia Assessment:                           - Prior to the procedure, a History and Physical                            was performed, and patient medications and                            allergies were reviewed. The patient's tolerance of                            previous anesthesia was also reviewed. The risks                            and benefits of the procedure and the sedation                            options and risks were discussed with the patient.                            All questions were answered, and informed consent                            was obtained. Prior Anticoagulants: The patient has                            taken no previous anticoagulant or antiplatelet                            agents. ASA Grade  Assessment: III - A patient with                            severe systemic disease. After reviewing the risks                            and benefits, the patient was deemed in                            satisfactory condition to undergo the procedure.                           After obtaining informed consent, the scope was                            passed under  direct vision. The GIF-H190 ([id number])                            Olympus gastroscope was introduced through the anus                            and advanced to the the descending colon. The                            flexible sigmoidoscopy was accomplished without                            difficulty. The patient tolerated the procedure.                            The quality of the bowel preparation was inadequate. Scope In: 12:53:33 PM Scope Out: 1:12:12 PM Total Procedure Duration: 0 hours 18 minutes 39 seconds  Findings:      The digital rectal exam findings include hemorrhoids. Pertinent       negatives include no palpable rectal lesions.      Extensive amounts of semi-solid solid stool was found in the entire       colon, interfering with visualization. Lavage of the area was performed       using copious amounts, resulting in incomplete clearance with continued       incomplete visualization but enough to feel that mucosa could exclude       significant chronic inflammation.      Many small-mouthed diverticula were found in the recto-sigmoid colon and       sigmoid colon.      A benign-appearing, intrinsic moderate narrowing/stenosis measuring 7 cm       (in length) x 9 mm (inner diameter) was found in the recto-sigmoid colon       and in the sigmoid colon and was traversed from 17 cm to 25 cm. This is       in an area of diverticulosis. This area was traversed with gentle       pressure and placement of patient in right lateral position.      Normal mucosa was found in the entire colon otherwise. Biopsies were       taken with a cold forceps for histology from the  left colon to rule out       chronic colitis. Biopsies were taken with a cold forceps for histology       from the rectum to rule out chronic proctitis - though unlikely based on       the clinical and endoscopic presentation.      Non-bleeding non-thrombosed external and internal hemorrhoids were found       during  retroflexion, during perianal exam and during digital exam. The       hemorrhoids were Grade II (internal hemorrhoids that prolapse but reduce       spontaneously). Impression:               - Preparation of the colon was inadequate.                           - Hemorrhoids found on digital rectal exam.                           - Stool in the entire examined colon. Lavaged.                           - Diverticulosis in the recto-sigmoid colon and in                            the sigmoid colon. Overt diverticulitis was not                            noted.                           - Stenosis/Narrowing in the recto-sigmoid colon and                            in the sigmoid colon in region of diverticulosis.                           - Normal mucosa in the entire examined colon.                            Biopsied to rule out chronic colitis/chronic                            proctitis.                           - Non-bleeding non-thrombosed external and internal                            hemorrhoids. Moderate Sedation:      Not Applicable - Patient had care per Anesthesia. Recommendation:           - The patient will be observed post-procedure,                            until all discharge criteria are met.                           -  Return patient to hospital ward for ongoing care.                           - Resume previous diet.                           - Await pathology results.                           - Based on endoscopic evaluation today, the                            underlying pathology does not look to be IBD                            related. Will await the pathology, but feel                            diverticular related issues seem most likely.                           - Further workup/management as per surgical service.                           - The findings and recommendations were discussed                            with the patient.                            - The findings and recommendations were discussed                            with the referring provider. Procedure Code(s):        --- Professional ---                           [phone], Sigmoidoscopy, flexible; with biopsy, single                            or multiple Diagnosis Code(s):        --- Professional ---                           K64.1, Second degree hemorrhoids                           K56.699, Other intestinal obstruction unspecified                            as to partial versus complete obstruction                           Z01.818, Encounter for other preprocedural  examination                           R10.30, Lower abdominal pain, unspecified                           R19.5, Other fecal abnormalities                           K57.30, Diverticulosis of large intestine without                            perforation or abscess without bleeding                           R93.3, Abnormal findings on diagnostic imaging of                            other parts of digestive tract CPT copyright 2019 American Medical Association. All rights reserved. The codes documented in this report are preliminary and upon coder review may  be revised to meet current compliance requirements. Justice Britain, MD 10/30/2019 1:55:56 PM Number of Addenda: 0

## 2019-10-30 NOTE — Anesthesia Preprocedure Evaluation (Signed)
Anesthesia Evaluation  Patient identified by MRN, date of birth, ID band Patient awake    Reviewed: Allergy & Precautions, NPO status , Patient's Chart, lab work & pertinent test results  Airway Mallampati: II  TM Distance: >3 FB Neck ROM: Full    Dental  (+) Teeth Intact   Pulmonary COPD, Current Smoker,    Pulmonary exam normal breath sounds clear to auscultation       Cardiovascular hypertension, Pt. on medications and Pt. on home beta blockers + Peripheral Vascular Disease  Normal cardiovascular exam+ dysrhythmias Atrial Fibrillation  Rhythm:Regular Rate:Normal     Neuro/Psych Seizures -,  PSYCHIATRIC DISORDERS Anxiety Depression    GI/Hepatic (+)     substance abuse  alcohol use, Suspected diverticulitis with colovaginal fistula   Endo/Other  negative endocrine ROS  Renal/GU negative Renal ROS     Musculoskeletal  (+) Arthritis ,   Abdominal   Peds  Hematology  (+) Blood dyscrasia, anemia ,   Anesthesia Other Findings Day of surgery medications reviewed with the patient.  Reproductive/Obstetrics                             Anesthesia Physical Anesthesia Plan  ASA: III  Anesthesia Plan: MAC   Post-op Pain Management:    Induction: Intravenous  PONV Risk Score and Plan: 1 and Propofol infusion and Treatment may vary due to age or medical condition  Airway Management Planned: Nasal Cannula and Natural Airway  Additional Equipment:   Intra-op Plan:   Post-operative Plan:   Informed Consent: I have reviewed the patients History and Physical, chart, labs and discussed the procedure including the risks, benefits and alternatives for the proposed anesthesia with the patient or authorized representative who has indicated his/her understanding and acceptance.       Plan Discussed with: CRNA and Anesthesiologist  Anesthesia Plan Comments:         Anesthesia Quick  Evaluation

## 2019-10-30 NOTE — Transfer of Care (Signed)
Immediate Anesthesia Transfer of Care Note  Patient: Susan Todd  Procedure(s) Performed: Procedure(s): FLEXIBLE SIGMOIDOSCOPY (N/A) BIOPSY  Patient Location: PACU  Anesthesia Type:MAC  Level of Consciousness: Patient easily awoken, sedated, comfortable, cooperative, following commands, responds to stimulation.   Airway & Oxygen Therapy: Patient spontaneously breathing, ventilating well, oxygen via simple oxygen mask.  Post-op Assessment: Report given to PACU RN, vital signs reviewed and stable, moving all extremities.   Post vital signs: Reviewed and stable.  Complications: No apparent anesthesia complications Last Vitals:  Vitals Value Taken Time  BP 122/84 10/30/19 1326  Temp 37.5 C 10/30/19 1326  Pulse 88 10/30/19 1327  Resp 16 10/30/19 1327  SpO2 100 % 10/30/19 1327  Vitals shown include unvalidated device data.  Last Pain:  Vitals:   10/30/19 1326  TempSrc: Axillary  PainSc:       Patients Stated Pain Goal: 3 (83/35/82 5189)  Complications: No complications documented.

## 2019-10-31 ENCOUNTER — Encounter (HOSPITAL_COMMUNITY): Payer: Self-pay | Admitting: Gastroenterology

## 2019-10-31 ENCOUNTER — Inpatient Hospital Stay (HOSPITAL_COMMUNITY): Payer: Self-pay

## 2019-10-31 LAB — COMPREHENSIVE METABOLIC PANEL
ALT: 9 U/L (ref 0–44)
AST: 12 U/L — ABNORMAL LOW (ref 15–41)
Albumin: 2.8 g/dL — ABNORMAL LOW (ref 3.5–5.0)
Alkaline Phosphatase: 94 U/L (ref 38–126)
Anion gap: 5 (ref 5–15)
BUN: 5 mg/dL — ABNORMAL LOW (ref 6–20)
CO2: 27 mmol/L (ref 22–32)
Calcium: 9.1 mg/dL (ref 8.9–10.3)
Chloride: 107 mmol/L (ref 98–111)
Creatinine, Ser: 0.81 mg/dL (ref 0.44–1.00)
GFR calc Af Amer: >60 mL/min (ref >60)
GFR calc non Af Amer: >60 mL/min (ref >60)
Glucose, Bld: 101 mg/dL — ABNORMAL HIGH (ref 70–99)
Potassium: 3.5 mmol/L (ref 3.5–5.1)
Sodium: 139 mmol/L (ref 135–145)
Total Bilirubin: 0.6 mg/dL (ref 0.3–1.2)
Total Protein: 5.9 g/dL — ABNORMAL LOW (ref 6.5–8.1)

## 2019-10-31 LAB — CBC WITH DIFFERENTIAL/PLATELET
Abs Immature Granulocytes: 0.01 10*3/uL (ref 0.00–0.07)
Basophils Absolute: 0 10*3/uL (ref 0.0–0.1)
Basophils Relative: 0 %
Eosinophils Absolute: 0.2 10*3/uL (ref 0.0–0.5)
Eosinophils Relative: 3 %
HCT: 30 % — ABNORMAL LOW (ref 36.0–46.0)
Hemoglobin: 9.3 g/dL — ABNORMAL LOW (ref 12.0–15.0)
Immature Granulocytes: 0 %
Lymphocytes Relative: 38 %
Lymphs Abs: 2.4 10*3/uL (ref 0.7–4.0)
MCH: 31.6 pg (ref 26.0–34.0)
MCHC: 31 g/dL (ref 30.0–36.0)
MCV: 102 fL — ABNORMAL HIGH (ref 80.0–100.0)
Monocytes Absolute: 0.7 10*3/uL (ref 0.1–1.0)
Monocytes Relative: 11 %
Neutro Abs: 3 10*3/uL (ref 1.7–7.7)
Neutrophils Relative %: 48 %
Platelets: 324 10*3/uL (ref 150–400)
RBC: 2.94 MIL/uL — ABNORMAL LOW (ref 3.87–5.11)
RDW: 15 % (ref 11.5–15.5)
WBC: 6.3 10*3/uL (ref 4.0–10.5)
nRBC: 0 % (ref 0.0–0.2)

## 2019-10-31 LAB — SURGICAL PATHOLOGY

## 2019-10-31 LAB — C-REACTIVE PROTEIN: CRP: 11.2 mg/dL — ABNORMAL HIGH (ref <1.0)

## 2019-10-31 LAB — MAGNESIUM: Magnesium: 1.8 mg/dL (ref 1.7–2.4)

## 2019-10-31 MED ORDER — IOHEXOL 9 MG/ML PO SOLN
ORAL | Status: AC
  Filled 2019-10-31: qty 1000

## 2019-10-31 MED ORDER — IOHEXOL 9 MG/ML PO SOLN
500.0000 mL | ORAL | Status: AC
  Administered 2019-10-31 (×2): 500 mL via ORAL

## 2019-10-31 MED ORDER — IOHEXOL 300 MG/ML  SOLN
100.0000 mL | Freq: Once | INTRAMUSCULAR | Status: AC | PRN
  Administered 2019-10-31: 100 mL via INTRAVENOUS

## 2019-10-31 MED ORDER — BOOST / RESOURCE BREEZE PO LIQD CUSTOM
1.0000 | Freq: Three times a day (TID) | ORAL | Status: DC
  Administered 2019-11-01 – 2019-11-12 (×28): 1 via ORAL

## 2019-10-31 NOTE — Progress Notes (Signed)
     [facility] Progress Note  CC:  Abdominal pain/pelvic abscess  Subjective:  Currently says pain is about a 7/10, but is sitting comfortable on the side of the bed having good conversation.  Received pain meds about an hour ago.  Last BM was 2 days ago.  Is on Miralax daily.  Flex sig on 9/8 as follows:  - Diverticulosis in the recto-sigmoid colon and in the sigmoid colon. Overt diverticulitis was not noted. - Stenosis/Narrowing in the recto-sigmoid colon and in the sigmoid colon in region of diverticulosis. - Normal mucosa in the entire examined colon. Biopsied to rule out chronic colitis/chronic proctitis. - Non-bleeding non-thrombosed external and internal hemorrhoids.  Objective:  Vital signs in last 24 hours: Temp:  [98.8 F (37.1 C)-99.5 F (37.5 C)] 99.4 F (37.4 C) (09/09 0616) Pulse Rate:  [75-96] 75 (09/09 0616) Resp:  [12-25] 16 (09/09 0616) BP: (122-152)/(76-90) 130/76 (09/09 0616) SpO2:  [95 %-98 %] 97 % (09/09 0616) Last BM Date: 10/28/19 General:  Alert, Well-developed, in NAD Heart:  Regular rate and rhythm; no murmurs Pulm:  CTAB.  No W/R/R. Abdomen:  Soft, non-distended.  BS present and somewhat hyperactive.  Non-tender. Extremities:  Without edema. Neurologic:  Alert and oriented 4;  grossly normal neurologically. Psych:  Alert and cooperative. Normal mood and affect.  Intake/Output from previous day: 09/08 0701 - 09/09 0700 In: 1424.7 [P.O.:730; I.V.:636.1; IV Piggyback:58.5] Out: -   Lab Results: Recent Labs    10/29/19 0320 10/30/19 0347 10/31/19 0427  WBC 4.9 6.5 6.3  HGB 8.5* 9.3* 9.3*  HCT 28.1* 30.3* 30.0*  PLT 325 326 324   BMET Recent Labs    10/29/19 0320 10/30/19 0347 10/31/19 0427  NA 141 140 139  K 3.8 3.7 3.5  CL 107 108 107  CO2 27 23 27   GLUCOSE 96 84 101*  BUN 5* <5* 5*  CREATININE 0.80 0.70 0.81  CALCIUM 9.1 8.9 9.1   LFT Recent Labs    10/31/19 0427  PROT 5.9*  ALBUMIN 2.8*  AST 12*  ALT 9   ALKPHOS 94  BILITOT 0.6   Assessment / Plan: *Complex right sided intra-abdominal inflammatory / infectious process with abscess. Favor complicated diverticulitis with abscess and colovaginal fistula.  Flex sig 9/8 showed diverticular disease with associated stenosis and narrowing.  This is thought to be all diverticular related.  Biopsies were taken to rule out IBD but colon otherwise appeared normal in the examined portion. --Abscess not amenable to IR drainage --On Zosyn day # 7 --Surgery following. --Tolerating full liquid diet.  --Await biopsy results.  Otherwise management per surgery.   LOS: 7 days   11/8. Ezekial Arns  10/31/2019, 9:07 AM

## 2019-10-31 NOTE — Progress Notes (Signed)
Patient ID: Susan Todd, female   DOB: 07/01/1965, 54 y.o.   MRN: [id number]  PROGRESS NOTE    TIERSA DAYLEY  HEN:[id number] DOB: 1965-12-26 DOA: 10/24/2019 PCP: Wenda Low, MD   Brief Narrative:  54 y.o. female with a history of HTN/HLD, anxiety/depression,seizure disorder, COPDwith history of previous diverticular disease who presented on 10/24/19 with worsening abdominal pain. Pain going on since early July initially with vaginal discharge, seen by her gynecologist and had multiple evaluation and was on different antibiotics subsequently referred to GI. CT of the abdomen and pelvis done on 10/23/2019 showed several abscesses within the abdomen coming from vagina/rectal region with concern for potential perforated appendicitis. 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 She was started on broad-spectrum antibiotics. GI and general surgery were consulted.  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 Appendicitis, possible rectal mass not completely ruled out -Currently on Zosyn. GI and general surgery following.  Status post flexible sigmoidoscopy on 10/30/2019 which showed diverticulosis in the rectosigmoid colon and in the sigmoid colon along with stenosis/narrowing in the rectosigmoid colon and the sigmoid colon in the region of diverticulosis; biopsies were taken.  Cultures negative so far. Leukocytosis has resolved.  Hypertension -Blood pressure stable. Home losartan and metoprolol on hold  Macrocytic anemia -Folate, vitamin B12 and ferritin and iron within normal limit. Monitor CBC intermittently.  Hemoglobin stable currently.  Hyperlipidemia -Continue statin  Anxiety/depression/history of seizure disorder -Outpatient follow-up  COPD -Stable.   Tobacco abuse -Patient was counseled regarding cessation by  prior hospitalist. Continue nicotine patch   DVT prophylaxis: Lovenox Code Status: Full Family Communication: Patient at bedside Disposition Plan: Status is: Inpatient  Remains inpatient appropriate because:Inpatient level of care appropriate due to severity of illness   Dispo: The patient is from: Home              Anticipated d/c is to: Home              Anticipated d/c date is: 2 days              Patient currently is not medically stable to d/c.   Consultants: GI/general surgery  Procedures:  Flexible sigmoidoscopy on 10/30/2019 Impression:               - Preparation of the colon was inadequate.                           - Hemorrhoids found on digital rectal exam.                           - Stool in the entire examined colon. Lavaged.                           - Diverticulosis in the recto-sigmoid colon and in                            the sigmoid colon. Overt diverticulitis was not                            noted.                           -  Stenosis/Narrowing in the recto-sigmoid colon and                            in the sigmoid colon in region of diverticulosis.                           - Normal mucosa in the entire examined colon.                            Biopsied to rule out chronic colitis/chronic                            proctitis.                           - Non-bleeding non-thrombosed external and internal                            hemorrhoids. Moderate Sedation:      Not Applicable - Patient had care per Anesthesia. Recommendation:           - The patient will be observed post-procedure,                            until all discharge criteria are met.                           - Return patient to hospital ward for ongoing care.                           - Resume previous diet.                           - Await pathology results.                           - Based on endoscopic evaluation today, the                            underlying pathology does  not look to be IBD                            related. Will await the pathology, but feel                            diverticular related issues seem most likely.                           - Further workup/management as per surgical service.                           - The findings and recommendations were discussed                            with the patient.                           -  The findings and recommendations were discussed                            with the referring provider.  Antimicrobials:  Anti-infectives (From admission, onward)   Start     Dose/Rate Route Frequency Ordered Stop   10/25/19 1000  vancomycin (VANCOCIN) IVPB 1000 mg/200 mL premix  Status:  Discontinued        1,000 mg 200 mL/hr over 60 Minutes Intravenous Every 12 hours 10/24/19 1835 10/28/19 1727   10/24/19 2300  piperacillin-tazobactam (ZOSYN) IVPB 3.375 g        3.375 g 12.5 mL/hr over 240 Minutes Intravenous Every 8 hours 10/24/19 1835     10/24/19 2000  vancomycin (VANCOREADY) IVPB 1500 mg/300 mL  Status:  Discontinued        1,500 mg 150 mL/hr over 120 Minutes Intravenous  Once 10/24/19 1835 10/24/19 1836   10/24/19 2000  vancomycin (VANCOREADY) IVPB 1500 mg/300 mL        1,500 mg 150 mL/hr over 120 Minutes Intravenous  Once 10/24/19 1836 10/24/19 2229   10/24/19 1730  piperacillin-tazobactam (ZOSYN) IVPB 3.375 g        3.375 g 100 mL/hr over 30 Minutes Intravenous  Once 10/24/19 1721 10/24/19 1810       Subjective: Patient seen and examined at bedside.  No overnight fever or vomiting reported.  Still complains of lower abdominal pain and some vaginal discharge.  No worsening shortness of breath.  Objective: Vitals:   10/30/19 1330 10/30/19 1340 10/30/19 2145 10/31/19 0616  BP: (!) 147/82 (!) 152/90 131/84 130/76  Pulse: 96 79 76 75  Resp: (!) '25 15 16 16  ' Temp:   98.8 F (37.1 C) 99.4 F (37.4 C)  TempSrc:   Oral Oral  SpO2: 98% 96% 97% 97%  Weight:      Height:         Intake/Output Summary (Last 24 hours) at 10/31/2019 0746 Last data filed at 10/31/2019 0558 Gross per 24 hour  Intake 1424.65 ml  Output --  Net 1424.65 ml   Filed Weights   10/24/19 0751  Weight: 78.5 kg    Examination:  General exam: No acute distress. Respiratory system: Bilateral decreased breath sounds at bases, no wheezing Cardiovascular system: Rate controlled, S1-S2 heard  gastrointestinal system: Abdomen is nondistended, soft and tender in the lower quadrant.  Bowel sounds are heard Extremities: No edema or clubbing  Data Reviewed: I have personally reviewed following labs and imaging studies  CBC: Recent Labs  Lab 10/27/19 0327 10/28/19 0418 10/29/19 0320 10/30/19 0347 10/31/19 0427  WBC 4.6 5.8 4.9 6.5 6.3  NEUTROABS  --   --   --   --  3.0  HGB 8.8* 9.4* 8.5* 9.3* 9.3*  HCT 29.3* 31.3* 28.1* 30.3* 30.0*  MCV 104.6* 104.0* 103.3* 102.7* 102.0*  PLT 388 392 325 326 130   Basic Metabolic Panel: Recent Labs  Lab 10/27/19 0327 10/28/19 0418 10/29/19 0320 10/30/19 0347 10/31/19 0427  NA 143 142 141 140 139  K 3.9 4.0 3.8 3.7 3.5  CL 108 107 107 108 107  CO2 '27 26 27 23 27  ' GLUCOSE 95 99 96 84 101*  BUN <5* 6 5* <5* 5*  CREATININE 0.67 0.67 0.80 0.70 0.81  CALCIUM 9.3 9.4 9.1 8.9 9.1  MG 1.8 1.7  --   --  1.8  PHOS 5.0* 5.0*  --   --   --  GFR: Estimated Creatinine Clearance: 85.3 mL/min (by C-G formula based on SCr of 0.81 mg/dL). Liver Function Tests: Recent Labs  Lab 10/24/19 1154 10/25/19 0313 10/28/19 0418 10/31/19 0427  AST 12* 9* 13* 12*  ALT '9 8 10 9  ' ALKPHOS 92 74 76 94  BILITOT 0.6 0.7 0.3 0.6  PROT 7.3 5.9* 6.0* 5.9*  ALBUMIN 3.4* 2.8* 2.7* 2.8*   No results for input(s): LIPASE, AMYLASE in the last 168 hours. No results for input(s): AMMONIA in the last 168 hours. Coagulation Profile: Recent Labs  Lab 10/25/19 0313  INR 1.0   Cardiac Enzymes: No results for input(s): CKTOTAL, CKMB, CKMBINDEX, TROPONINI in the last  168 hours. BNP (last 3 results) No results for input(s): PROBNP in the last 8760 hours. HbA1C: No results for input(s): HGBA1C in the last 72 hours. CBG: No results for input(s): GLUCAP in the last 168 hours. Lipid Profile: No results for input(s): CHOL, HDL, LDLCALC, TRIG, CHOLHDL, LDLDIRECT in the last 72 hours. Thyroid Function Tests: No results for input(s): TSH, T4TOTAL, FREET4, T3FREE, THYROIDAB in the last 72 hours. Anemia Panel: Recent Labs    10/29/19 0320  VITAMINB12 1,534*  FOLATE 19.6  FERRITIN 129  TIBC 230*  IRON 46   Sepsis Labs: Recent Labs  Lab 10/24/19 1620 10/24/19 1945 10/25/19 0313  PROCALCITON  --   --  <0.10  LATICACIDVEN 1.7 1.1  --     Recent Results (from the past 240 hour(s))  SARS Coronavirus 2 by RT PCR (hospital order, performed in [facility] hospital lab) Nasopharyngeal Nasopharyngeal Swab     Status: None   Collection Time: 10/24/19  3:48 PM   Specimen: Nasopharyngeal Swab  Result Value Ref Range Status   SARS Coronavirus 2 NEGATIVE NEGATIVE Final    Comment: (NOTE) SARS-CoV-2 target nucleic acids are NOT DETECTED.  The SARS-CoV-2 RNA is generally detectable in upper and lower respiratory specimens during the acute phase of infection. The lowest concentration of SARS-CoV-2 viral copies this assay can detect is 250 copies / mL. A negative result does not preclude SARS-CoV-2 infection and should not be used as the sole basis for treatment or other patient management decisions.  A negative result may occur with improper specimen collection / handling, submission of specimen other than nasopharyngeal swab, presence of viral mutation(s) within the areas targeted by this assay, and inadequate number of viral copies (<250 copies / mL). A negative result must be combined with clinical observations, patient history, and epidemiological information.  Fact Sheet for Patients:   StrictlyIdeas.no  Fact Sheet for  Healthcare Providers: BankingDealers.co.za  This test is not yet approved or  cleared by the [place] FDA and has been authorized for detection and/or diagnosis of SARS-CoV-2 by FDA under an Emergency Use Authorization (EUA).  This EUA will remain in effect (meaning this test can be used) for the duration of the COVID-19 declaration under Section 564(b)(1) of the Act, 21 U.S.C. section 360bbb-3(b)(1), unless the authorization is terminated or revoked sooner.  Performed at [facility], [place] [address] [place], [place] [address]   Culture, blood (routine x 2)     Status: None   Collection Time: 10/24/19  7:46 PM   Specimen: BLOOD  Result Value Ref Range Status   Specimen Description   Final    BLOOD RIGHT ANTECUBITAL Performed at [place] [address] [place], [place] [address]    Special Requests   Final    BOTTLES DRAWN AEROBIC  AND ANAEROBIC Blood Culture adequate volume Performed at [place] [address] [place], [place] [address]    Culture   Final    NO GROWTH 5 DAYS Performed at [facility], [place] [address] [place], [place] [address]    Report Status 10/29/2019 FINAL  Final  Culture, blood (routine x 2)     Status: None   Collection Time: 10/24/19  7:52 PM   Specimen: BLOOD  Result Value Ref Range Status   Specimen Description   Final    BLOOD BLOOD RIGHT HAND Performed at [place] [address] [place], [place] [address]    Special Requests   Final    BOTTLES DRAWN AEROBIC AND ANAEROBIC Blood Culture adequate volume Performed at [place] [address], [place], [place] [address]    Culture   Final    NO GROWTH 5 DAYS Performed at [facility], [place] [address] [place], [place] [address]    Report Status 10/29/2019 FINAL  Final  Surgical PCR screen     Status: None   Collection Time: 10/25/19 12:14 AM    Specimen: Nasal Mucosa; Nasal Swab  Result Value Ref Range Status   MRSA, PCR NEGATIVE NEGATIVE Final   Staphylococcus aureus NEGATIVE NEGATIVE Final    Comment: (NOTE) The Xpert SA Assay (FDA approved for NASAL specimens in patients 41 years of age and older), is one component of a comprehensive surveillance program. It is not intended to diagnose infection nor to guide or monitor treatment. Performed at [facility], [place] [address] [place], [place] [address]          Radiology Studies: No results found.      Scheduled Meds: . aspirin EC  81 mg Oral Daily  . atorvastatin  10 mg Oral QPM  . calcium-vitamin D  1 tablet Oral QHS  . enoxaparin (LOVENOX) injection  40 mg Subcutaneous Q24H  . loratadine  10 mg Oral Daily  . multivitamin with minerals  1 tablet Oral QHS  . nicotine  7 mg Transdermal Daily  . polyethylene glycol  17 g Oral Daily   Continuous Infusions: . lactated ringers    . lactated ringers 75 mL/hr at 10/31/19 0528  . piperacillin-tazobactam (ZOSYN)  IV 3.375 g (10/31/19 0526)          Aline August, MD [facility] 10/31/2019, 7:46 AM

## 2019-10-31 NOTE — Progress Notes (Addendum)
1 Day Post-Op    TO:[id number] pain  Subjective: Persistent abdominal pain in subrapubic region, RLQ, and right back/buttock - pain is described as sharp, aching, and slightly worse today than the last couple of days. Lat BM was over the weekend but is passing small amounts of feculent vaginal discharge. Denies fever, chills, nausea, vomiting.   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  Unchanged from prior study.  General appearance: alert, cooperative and no distress Resp: clear to auscultation bilaterally GI: soft, mild distention, lower abdominal tenderness, suprapubic region is the most tender and patient has mild guarding upon palpation. No peritonitis. +BS  Lab Results:  Recent Labs    10/30/19 0347 10/31/19 0427  WBC 6.5 6.3  HGB 9.3* 9.3*  HCT 30.3* 30.0*  PLT 326 324    BMET Recent Labs    10/30/19 0347 10/31/19 0427  NA 140 139  K 3.7 3.5  CL 108 107  CO2 23 27  GLUCOSE 84 101*  BUN <5* 5*  CREATININE 0.70 0.81  CALCIUM 8.9 9.1   PT/INR No results for input(s): LABPROT, INR in the last 72 hours.  Recent Labs  Lab 10/25/19 0313 10/28/19 0418 10/31/19 0427  AST 9* 13* 12*  ALT 8 10 9   ALKPHOS 74 76 94  BILITOT 0.7 0.3 0.6  PROT 5.9* 6.0* 5.9*  ALBUMIN 2.8* 2.7* 2.8*     Lipase     Component Value Date/Time   LIPASE 18 09/19/2019 1459     Medications: . aspirin EC  81 mg Oral Daily  . atorvastatin  10 mg Oral QPM  . calcium-vitamin D  1 tablet Oral QHS  . enoxaparin  (LOVENOX) injection  40 mg Subcutaneous Q24H  . feeding supplement  1 Container Oral TID BM  . iohexol  500 mL Oral Q1H  . iohexol      . loratadine  10 mg Oral Daily  . multivitamin with minerals  1 tablet Oral QHS  . nicotine  7 mg Transdermal Daily  . polyethylene glycol  17 g Oral Daily   . lactated ringers    . lactated ringers 75 mL/hr at 10/31/19 0528  . 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 3.5 cm fluid collection posterior to the sigmoid colon, 3.2 cm fluid collection in the RLQ. Neither were amenable to IR drainage per chart review. - s/p flex sig 9/8 which showed recto-sigmoid narrowing without a mass. Bx negative for malignancy. -  Clinically the patient has shown some improvement on IV abx but has persistent pain requiring IV narcotic medications. - repeat CT scan today to evaluate abscesses - if they are larger or in a different position they may be amenable to percutaneous drainage. If not, given lack of improvement after on week of abx, patient may benefit from fecal diversion.  FEN: FLD, add boost breeze. ID:  Zosyn 9/2  >> day 8 DVT: Lovenox Follow-up: TBD  Plan: Repeat CT abdomen/pelvis. Continue IV abx.   LOS: 7 days    Adam Phenix 10/31/2019 Please see Amion

## 2019-10-31 NOTE — Plan of Care (Signed)
  Problem: Pain Managment: Goal: General experience of comfort will improve Outcome: Progressing   

## 2019-11-01 ENCOUNTER — Encounter: Payer: Self-pay | Admitting: Gastroenterology

## 2019-11-01 LAB — C-REACTIVE PROTEIN: CRP: 10.3 mg/dL — ABNORMAL HIGH (ref <1.0)

## 2019-11-01 LAB — BASIC METABOLIC PANEL
Anion gap: 9 (ref 5–15)
BUN: <5 mg/dL — ABNORMAL LOW (ref 6–20)
CO2: 22 mmol/L (ref 22–32)
Calcium: 8.9 mg/dL (ref 8.9–10.3)
Chloride: 108 mmol/L (ref 98–111)
Creatinine, Ser: 0.7 mg/dL (ref 0.44–1.00)
GFR calc Af Amer: >60 mL/min (ref >60)
GFR calc non Af Amer: >60 mL/min (ref >60)
Glucose, Bld: 95 mg/dL (ref 70–99)
Potassium: 3.3 mmol/L — ABNORMAL LOW (ref 3.5–5.1)
Sodium: 139 mmol/L (ref 135–145)

## 2019-11-01 LAB — CBC WITH DIFFERENTIAL/PLATELET
Abs Immature Granulocytes: 0.02 10*3/uL (ref 0.00–0.07)
Basophils Absolute: 0 10*3/uL (ref 0.0–0.1)
Basophils Relative: 0 %
Eosinophils Absolute: 0.2 10*3/uL (ref 0.0–0.5)
Eosinophils Relative: 3 %
HCT: 29.6 % — ABNORMAL LOW (ref 36.0–46.0)
Hemoglobin: 8.9 g/dL — ABNORMAL LOW (ref 12.0–15.0)
Immature Granulocytes: 0 %
Lymphocytes Relative: 32 %
Lymphs Abs: 2.5 10*3/uL (ref 0.7–4.0)
MCH: 31 pg (ref 26.0–34.0)
MCHC: 30.1 g/dL (ref 30.0–36.0)
MCV: 103.1 fL — ABNORMAL HIGH (ref 80.0–100.0)
Monocytes Absolute: 0.9 10*3/uL (ref 0.1–1.0)
Monocytes Relative: 11 %
Neutro Abs: 4.2 10*3/uL (ref 1.7–7.7)
Neutrophils Relative %: 54 %
Platelets: 358 10*3/uL (ref 150–400)
RBC: 2.87 MIL/uL — ABNORMAL LOW (ref 3.87–5.11)
RDW: 15 % (ref 11.5–15.5)
WBC: 7.9 10*3/uL (ref 4.0–10.5)
nRBC: 0 % (ref 0.0–0.2)

## 2019-11-01 LAB — MAGNESIUM: Magnesium: 1.7 mg/dL (ref 1.7–2.4)

## 2019-11-01 MED ORDER — SODIUM CHLORIDE 0.9% FLUSH
10.0000 mL | Freq: Two times a day (BID) | INTRAVENOUS | Status: DC
  Administered 2019-11-05 – 2019-11-27 (×13): 10 mL

## 2019-11-01 MED ORDER — SODIUM CHLORIDE 0.9% FLUSH
10.0000 mL | INTRAVENOUS | Status: DC | PRN
  Administered 2019-11-02 – 2019-11-12 (×4): 10 mL

## 2019-11-01 NOTE — Progress Notes (Signed)
Patient ID: Susan Todd, female   DOB: 1965/09/14, 54 y.o.   MRN: [id number]  PROGRESS NOTE    EMALYNN CLEWIS  DUK:[id number] DOB: [birth year] DOA: 10/24/2019 PCP: Wenda Low, MD   Brief Narrative:  54 y.o. female with a history of HTN/HLD, anxiety/depression,seizure disorder, COPDwith history of previous diverticular disease who presented on 10/24/19 with worsening abdominal pain. Pain going on since early July initially with vaginal discharge, seen by her gynecologist and had multiple evaluation and was on different antibiotics subsequently referred to GI. CT of the abdomen and pelvis done on 10/23/2019 showed several abscesses within the abdomen coming from vagina/rectal region with concern for potential perforated appendicitis. 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 She was started on broad-spectrum antibiotics. GI and general surgery were consulted.  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 Appendicitis, possible rectal mass not completely ruled out -Currently on Zosyn. general surgery following.  Status post flexible sigmoidoscopy on 10/30/2019 which showed diverticulosis in the rectosigmoid colon and in the sigmoid colon along with stenosis/narrowing in the rectosigmoid colon and the sigmoid colon in the region of diverticulosis; biopsies were taken. GI signed off on 10/31/2019. Outpatient follow-up with GI for full colonoscopy. - Cultures negative so far. Leukocytosis has resolved. -CT of the abdomen and pelvis repeated on 10/31/2019 still shows centrally located abscess within the pelvis which is increased in size and is not amenable to percutaneous drainage given its location.  Follow further general surgery recommendations.  Hypertension -Blood pressure stable. Home losartan and metoprolol on  hold  Hypokalemia -Replace  Macrocytic anemia -Folate, vitamin B12 and ferritin and iron within normal limit. Monitor CBC intermittently.  Hemoglobin stable currently.  Hyperlipidemia -Continue statin  Anxiety/depression/history of seizure disorder -Outpatient follow-up  COPD -Stable.   Tobacco abuse -Patient was counseled regarding cessation by prior hospitalist. Continue nicotine patch   DVT prophylaxis: Lovenox Code Status: Full Family Communication: Patient at bedside Disposition Plan: Status is: Inpatient  Remains inpatient appropriate because:Inpatient level of care appropriate due to severity of illness   Dispo: The patient is from: Home              Anticipated d/c is to: Home              Anticipated d/c date is: 3 days              Patient currently is not medically stable to d/c.   Consultants: GI/general surgery  Procedures:  Flexible sigmoidoscopy on 10/30/2019 Impression:               - Preparation of the colon was inadequate.                           - Hemorrhoids found on digital rectal exam.                           - Stool in the entire examined colon. Lavaged.                           - Diverticulosis in the recto-sigmoid colon and in                            the sigmoid colon.  Overt diverticulitis was not                            noted.                           - Stenosis/Narrowing in the recto-sigmoid colon and                            in the sigmoid colon in region of diverticulosis.                           - Normal mucosa in the entire examined colon.                            Biopsied to rule out chronic colitis/chronic                            proctitis.                           - Non-bleeding non-thrombosed external and internal                            hemorrhoids. Moderate Sedation:      Not Applicable - Patient had care per Anesthesia. Recommendation:           - The patient will be observed post-procedure,                             until all discharge criteria are met.                           - Return patient to hospital ward for ongoing care.                           - Resume previous diet.                           - Await pathology results.                           - Based on endoscopic evaluation today, the                            underlying pathology does not look to be IBD                            related. Will await the pathology, but feel                            diverticular related issues seem most likely.                           - Further workup/management as per surgical service.                           -  The findings and recommendations were discussed                            with the patient.                           - The findings and recommendations were discussed                            with the referring provider.  Antimicrobials:  Anti-infectives (From admission, onward)   Start     Dose/Rate Route Frequency Ordered Stop   10/25/19 1000  vancomycin (VANCOCIN) IVPB 1000 mg/200 mL premix  Status:  Discontinued        1,000 mg 200 mL/hr over 60 Minutes Intravenous Every 12 hours 10/24/19 1835 10/28/19 1727   10/24/19 2300  piperacillin-tazobactam (ZOSYN) IVPB 3.375 g        3.375 g 12.5 mL/hr over 240 Minutes Intravenous Every 8 hours 10/24/19 1835     10/24/19 2000  vancomycin (VANCOREADY) IVPB 1500 mg/300 mL  Status:  Discontinued        1,500 mg 150 mL/hr over 120 Minutes Intravenous  Once 10/24/19 1835 10/24/19 1836   10/24/19 2000  vancomycin (VANCOREADY) IVPB 1500 mg/300 mL        1,500 mg 150 mL/hr over 120 Minutes Intravenous  Once 10/24/19 1836 10/24/19 2229   10/24/19 1730  piperacillin-tazobactam (ZOSYN) IVPB 3.375 g        3.375 g 100 mL/hr over 30 Minutes Intravenous  Once 10/24/19 1721 10/24/19 1810       Subjective: Patient seen and examined at bedside. Still complains of intermittent lower abdominal pain. No overnight fever, vomiting,  shortness of breath reported. Reports having diarrhea overnight.  Wants to try some soft diet today. Objective: Vitals:   10/31/19 0616 10/31/19 1333 10/31/19 2150 11/01/19 0628  BP: 130/76 (!) 133/97 133/85 (!) 106/94  Pulse: 75 71 77 77  Resp: _0 Temp: 99.4 F (37.4 C) 98.7 F (37.1 C) 99.1 F (37.3 C) 98.7 F (37.1 C)  TempSrc: Oral Oral Oral Oral  SpO2: 97% 98% 97% 97%  Weight:      Height:        Intake/Output Summary (Last 24 hours) at 11/01/2019 0753 Last data filed at 11/01/2019 0634 Gross per 24 hour  Intake 2229.64 ml  Output --  Net 2229.64 ml   Filed Weights   10/24/19 0751  Weight: 78.5 kg    Examination:  General exam: No distress. Respiratory system: Bilateral decreased breath sounds at bases with some scattered crackles Cardiovascular system: S1-S2 heard, rate controlled gastrointestinal system: Abdomen is nondistended, soft and has lower quadrant tenderness. Bowel sounds are heard Extremities: No cyanosis, clubbing or edema.  Data Reviewed: I have personally reviewed following labs and imaging studies  CBC: Recent Labs  Lab 10/28/19 0418 10/29/19 0320 10/30/19 0347 10/31/19 0427 11/01/19 0326  WBC 5.8 4.9 6.5 6.3 7.9  NEUTROABS  --   --   --  3.0 4.2  HGB 9.4* 8.5* 9.3* 9.3* 8.9*  HCT 31.3* 28.1* 30.3* 30.0* 29.6*  MCV 104.0* 103.3* 102.7* 102.0* 103.1*  PLT 392 325 326 324 222   Basic Metabolic Panel: Recent Labs  Lab 10/27/19 0327 10/27/19 0327 10/28/19 0418 10/29/19 0320 10/30/19 0347 10/31/19 0427 11/01/19 0326  NA 143   < >  142 141 140 139 139  K 3.9   < > 4.0 3.8 3.7 3.5 3.3*  CL 108   < > 107 107 108 107 108  CO2 27   < > _0 GLUCOSE 95   < > 99 96 84 101* 95  BUN <5*   < > 6 5* <5* 5* <5*  CREATININE 0.67   < > 0.67 0.80 0.70 0.81 0.70  CALCIUM 9.3   < > 9.4 9.1 8.9 9.1 8.9  MG 1.8  --  1.7  --   --  1.8 1.7  PHOS 5.0*  --  5.0*  --   --   --   --    < > = values in this interval not displayed.    GFR: Estimated Creatinine Clearance: 86.4 mL/min (by C-G formula based on SCr of 0.7 mg/dL). Liver Function Tests: Recent Labs  Lab 10/28/19 0418 10/31/19 0427  AST 13* 12*  ALT 10 9  ALKPHOS 76 94  BILITOT 0.3 0.6  PROT 6.0* 5.9*  ALBUMIN 2.7* 2.8*   No results for input(s): LIPASE, AMYLASE in the last 168 hours. No results for input(s): AMMONIA in the last 168 hours. Coagulation Profile: No results for input(s): INR, PROTIME in the last 168 hours. Cardiac Enzymes: No results for input(s): CKTOTAL, CKMB, CKMBINDEX, TROPONINI in the last 168 hours. BNP (last 3 results) No results for input(s): PROBNP in the last 8760 hours. HbA1C: No results for input(s): HGBA1C in the last 72 hours. CBG: No results for input(s): GLUCAP in the last 168 hours. Lipid Profile: No results for input(s): CHOL, HDL, LDLCALC, TRIG, CHOLHDL, LDLDIRECT in the last 72 hours. Thyroid Function Tests: No results for input(s): TSH, T4TOTAL, FREET4, T3FREE, THYROIDAB in the last 72 hours. Anemia Panel: No results for input(s): VITAMINB12, FOLATE, FERRITIN, TIBC, IRON, RETICCTPCT in the last 72 hours. Sepsis Labs: No results for input(s): PROCALCITON, LATICACIDVEN in the last 168 hours.  Recent Results (from the past 240 hour(s))  SARS Coronavirus 2 by RT PCR (hospital order, performed in [facility] hospital lab) Nasopharyngeal Nasopharyngeal Swab     Status: None   Collection Time: 10/24/19  3:48 PM   Specimen: Nasopharyngeal Swab  Result Value Ref Range Status   SARS Coronavirus 2 NEGATIVE NEGATIVE Final    Comment: (NOTE) SARS-CoV-2 target nucleic acids are NOT DETECTED.  The SARS-CoV-2 RNA is generally detectable in upper and lower respiratory specimens during the acute phase of infection. The lowest concentration of SARS-CoV-2 viral copies this assay can detect is 250 copies / mL. A negative result does not preclude SARS-CoV-2 infection and should not be used as the sole basis for treatment  or other patient management decisions.  A negative result may occur with improper specimen collection / handling, submission of specimen other than nasopharyngeal swab, presence of viral mutation(s) within the areas targeted by this assay, and inadequate number of viral copies (<250 copies / mL). A negative result must be combined with clinical observations, patient history, and epidemiological information.  Fact Sheet for Patients:   StrictlyIdeas.no  Fact Sheet for Healthcare Providers: BankingDealers.co.za  This test is not yet approved or  cleared by the [place] FDA and has been authorized for detection and/or diagnosis of SARS-CoV-2 by FDA under an Emergency Use Authorization (EUA).  This EUA will remain in effect (meaning this test can be used) for the duration of the COVID-19 declaration under Section 564(b)(1) of the Act, 21 U.S.C. section  360bbb-3(b)(1), unless the authorization is terminated or revoked sooner.  Performed at [facility], [place] [address] [place], [place] [address]   Culture, blood (routine x 2)     Status: None   Collection Time: 10/24/19  7:46 PM   Specimen: BLOOD  Result Value Ref Range Status   Specimen Description   Final    BLOOD RIGHT ANTECUBITAL Performed at [place] [address] [place], [place] [address]    Special Requests   Final    BOTTLES DRAWN AEROBIC AND ANAEROBIC Blood Culture adequate volume Performed at [place] [address] [place], [place] [address]    Culture   Final    NO GROWTH 5 DAYS Performed at [facility], [place] [address] [place], [place] [address]    Report Status 10/29/2019 FINAL  Final  Culture, blood (routine x 2)     Status: None   Collection Time: 10/24/19  7:52 PM   Specimen: BLOOD  Result Value Ref Range Status   Specimen Description   Final    BLOOD BLOOD RIGHT HAND Performed at  [place] [address], [place], [place] [address]    Special Requests   Final    BOTTLES DRAWN AEROBIC AND ANAEROBIC Blood Culture adequate volume Performed at [place] [address] [place], [place] [address]    Culture   Final    NO GROWTH 5 DAYS Performed at [facility], [place] [address], [place], [place] [address]    Report Status 10/29/2019 FINAL  Final  Surgical PCR screen     Status: None   Collection Time: 10/25/19 12:14 AM   Specimen: Nasal Mucosa; Nasal Swab  Result Value Ref Range Status   MRSA, PCR NEGATIVE NEGATIVE Final   Staphylococcus aureus NEGATIVE NEGATIVE Final    Comment: (NOTE) The Xpert SA Assay (FDA approved for NASAL specimens in patients 67 years of age and older), is one component of a comprehensive surveillance program. It is not intended to diagnose infection nor to guide or monitor treatment. Performed at [facility], [place] [address] [place], [place] [address]          Radiology Studies: CT ABDOMEN PELVIS W CONTRAST  Result Date: 10/31/2019 CLINICAL DATA:  Persistent abdominal pain in the suprapubic region. Right lower quadrant pain. Right buttock and back pain. EXAM: CT ABDOMEN AND PELVIS WITH CONTRAST TECHNIQUE: Multidetector CT imaging of the abdomen and pelvis was performed using the standard protocol following bolus administration of intravenous contrast. CONTRAST:  191m OMNIPAQUE IOHEXOL 300 MG/ML  SOLN COMPARISON:  10/23/2019 FINDINGS: Lower chest: The lung bases are clear. The heart size is normal. Hepatobiliary: The liver is normal. Normal gallbladder.There is no biliary ductal dilation. Pancreas: Normal contours without ductal dilatation. No peripancreatic fluid collection. Spleen: Unremarkable. Adrenals/Urinary Tract: --Adrenal glands: Unremarkable. --Right kidney/ureter: No hydronephrosis or radiopaque kidney stones. --Left kidney/ureter: No hydronephrosis or  radiopaque kidney stones. --Urinary bladder: There is mild wall thickening of the urinary bladder which is likely reactive. Stomach/Bowel: --Stomach/Duodenum: No hiatal hernia or other gastric abnormality. Normal duodenal course and caliber. --Small bowel: Unremarkable. --Colon: Again noted are extensive inflammatory changes in the patient's pelvis with diffuse circumferential wall thickening of the sigmoid colon with findings consistent with fistulous tracks between the rectum and sigmoid colon (axial series 2, image 69). Again noted is a centrally located air and fluid collection in the patient's pelvis measuring 4.5 x  4.4 cm this collection has increased in size from prior study when it measured approximately 3.5 x 3 cm. Scattered colonic diverticula are again noted --Appendix: There is persistent diffuse circumferential wall thickening of the appendix which measures up to approximately 1.3 cm. Vascular/Lymphatic: Atherosclerotic calcification is present within the non-aneurysmal abdominal aorta, without hemodynamically significant stenosis. --there are prominent but non pathologically enlarged retroperitoneal lymph nodes. --No mesenteric lymphadenopathy. --there are few mildly enlarged pelvic lymph nodes which are stable from prior study and are favored to be reactive in etiology. Reproductive: There is a stable 3.2 cm cystic mass closely associated with the right ovary. Other: There is a small amount of reactive free fluid in the patient's pelvis. The abdominal wall is normal. Musculoskeletal. There is severe degenerative changes at the L2-L3 level. IMPRESSION: 1. Persistent extensive infectious/inflammatory changes in the patient's pelvis as previously described. These inflammatory changes have not significantly improved from the prior study. Again noted is a centrally located abscess within the pelvis which has demonstrated interval increase in size. This abscess is not amenable to percutaneous drainage given  its location. 2. Otherwise, stable exam from prior study. Electronically Signed   By: Constance Holster M.D.   On: 10/31/2019 17:20        Scheduled Meds: . aspirin EC  81 mg Oral Daily  . atorvastatin  10 mg Oral QPM  . calcium-vitamin D  1 tablet Oral QHS  . enoxaparin (LOVENOX) injection  40 mg Subcutaneous Q24H  . feeding supplement  1 Container Oral TID BM  . loratadine  10 mg Oral Daily  . multivitamin with minerals  1 tablet Oral QHS  . nicotine  7 mg Transdermal Daily  . polyethylene glycol  17 g Oral Daily   Continuous Infusions: . lactated ringers    . lactated ringers 75 mL/hr at 10/31/19 2200  . piperacillin-tazobactam (ZOSYN)  IV 3.375 g (11/01/19 0180)          Aline August, MD [facility] 11/01/2019, 7:53 AM

## 2019-11-01 NOTE — Progress Notes (Signed)
Paged [facility] Cover[facility] Audrea Muscat, NP) to inform about patient having had 3 type 7 stools in the past 7 hours.  Response from [facility] Floor Coverage Audrea Muscat, NP): No new orders at this time.

## 2019-11-01 NOTE — Progress Notes (Signed)
2 Days Post-Op    GB:[id number] pain  Subjective: Reports she had a rough night due to cramping and diarrhea after PO contrast.  Tearful when we discuss ostomy.  Objective: Vital signs in last 24 hours: Temp:  [98.7 F (37.1 C)-99.1 F (37.3 C)] 98.7 F (37.1 C) (09/10 0628) Pulse Rate:  [71-77] 77 (09/10 0628) Resp:  [14-18] 14 (09/10 0628) BP: (106-133)/(85-97) 106/94 (09/10 0628) SpO2:  [97 %-98 %] 97 % (09/10 0628) Last BM Date: 11/01/19   General appearance: alert, cooperative and no distress Resp: clear to auscultation bilaterally GI: soft, mild distention, lower abdominal tenderness, suprapubic region is the most tender and patient has mild guarding upon palpation. No peritonitis. +BS  Lab Results:  Recent Labs    10/31/19 0427 11/01/19 0326  WBC 6.3 7.9  HGB 9.3* 8.9*  HCT 30.0* 29.6*  PLT 324 358    BMET Recent Labs    10/31/19 0427 11/01/19 0326  NA 139 139  K 3.5 3.3*  CL 107 108  CO2 27 22  GLUCOSE 101* 95  BUN 5* <5*  CREATININE 0.81 0.70  CALCIUM 9.1 8.9   Recent Labs  Lab 10/28/19 0418 10/31/19 0427  AST 13* 12*  ALT 10 9  ALKPHOS 76 94  BILITOT 0.3 0.6  PROT 6.0* 5.9*  ALBUMIN 2.7* 2.8*    Lipase     Component Value Date/Time   LIPASE 18 09/19/2019 1459     Medications: . aspirin EC  81 mg Oral Daily  . atorvastatin  10 mg Oral QPM  . calcium-vitamin D  1 tablet Oral QHS  . enoxaparin (LOVENOX) injection  40 mg Subcutaneous Q24H  . feeding supplement  1 Container Oral TID BM  . loratadine  10 mg Oral Daily  . multivitamin with minerals  1 tablet Oral QHS  . nicotine  7 mg Transdermal Daily  . polyethylene glycol  17 g Oral Daily   . lactated ringers    . lactated ringers 75 mL/hr at 10/31/19 2200  . 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 3.5 cm fluid collection posterior to the sigmoid colon, 3.2 cm fluid collection in the RLQ. Neither were amenable to IR drainage per chart review. - s/p flex sig 9/8 which showed recto-sigmoid narrowing without a mass. Bx negative for malignancy. -  CT repeated 9/9 due to lack of clinical improvement, showed interval increase in pelvic fluid collection, not amenable to IR drainage.  - At this point I do not think the inflammatory process in the patients pelvis is going to improve without fecal diversion. This was discussed with Dr. Maisie Fus and at this point we recommend that the patient go to the OR for fecal diversion, washout of the pelvis abscess, and placement of blake drain. Will discuss timing of surgery with MD.    FEN: FLD, add boost breeze. ID: Zosyn 9/2  >> day 9 DVT: Lovenox Follow-up: Dr. Romie Levee     LOS: 8 days    Susan Todd 11/01/2019 Please see Amion

## 2019-11-02 LAB — CBC WITH DIFFERENTIAL/PLATELET
Abs Immature Granulocytes: 0.01 K/uL (ref 0.00–0.07)
Basophils Absolute: 0 K/uL (ref 0.0–0.1)
Basophils Relative: 0 %
Eosinophils Absolute: 0.3 K/uL (ref 0.0–0.5)
Eosinophils Relative: 4 %
HCT: 25.6 % — ABNORMAL LOW (ref 36.0–46.0)
Hemoglobin: 7.9 g/dL — ABNORMAL LOW (ref 12.0–15.0)
Immature Granulocytes: 0 %
Lymphocytes Relative: 35 %
Lymphs Abs: 2 K/uL (ref 0.7–4.0)
MCH: 31.6 pg (ref 26.0–34.0)
MCHC: 30.9 g/dL (ref 30.0–36.0)
MCV: 102.4 fL — ABNORMAL HIGH (ref 80.0–100.0)
Monocytes Absolute: 0.7 K/uL (ref 0.1–1.0)
Monocytes Relative: 12 %
Neutro Abs: 2.7 K/uL (ref 1.7–7.7)
Neutrophils Relative %: 49 %
Platelets: 283 K/uL (ref 150–400)
RBC: 2.5 MIL/uL — ABNORMAL LOW (ref 3.87–5.11)
RDW: 14.7 % (ref 11.5–15.5)
WBC: 5.7 K/uL (ref 4.0–10.5)
nRBC: 0 % (ref 0.0–0.2)

## 2019-11-02 LAB — BASIC METABOLIC PANEL
Anion gap: 9 (ref 5–15)
BUN: 6 mg/dL (ref 6–20)
CO2: 25 mmol/L (ref 22–32)
Calcium: 8.5 mg/dL — ABNORMAL LOW (ref 8.9–10.3)
Chloride: 105 mmol/L (ref 98–111)
Creatinine, Ser: 0.64 mg/dL (ref 0.44–1.00)
GFR calc Af Amer: >60 mL/min (ref >60)
GFR calc non Af Amer: >60 mL/min (ref >60)
Glucose, Bld: 104 mg/dL — ABNORMAL HIGH (ref 70–99)
Potassium: 3.4 mmol/L — ABNORMAL LOW (ref 3.5–5.1)
Sodium: 139 mmol/L (ref 135–145)

## 2019-11-02 LAB — MAGNESIUM: Magnesium: 1.7 mg/dL (ref 1.7–2.4)

## 2019-11-02 NOTE — Progress Notes (Signed)
Patient ID: Susan Todd, female   DOB: Apr 13, 1965, 54 y.o.   MRN: [id number]  PROGRESS NOTE    LINNET BOTTARI  PFX:[id number] DOB: Apr 13, 1965 DOA: 10/24/2019 PCP: Wenda Low, MD   Brief Narrative:  54 y.o. female with a history of HTN/HLD, anxiety/depression,seizure disorder, COPDwith history of previous diverticular disease who presented on 10/24/19 with worsening abdominal pain. Pain going on since early July initially with vaginal discharge, seen by her gynecologist and had multiple evaluation and was on different antibiotics subsequently referred to GI. CT of the abdomen and pelvis done on 10/23/2019 showed several abscesses within the abdomen coming from vagina/rectal region with concern for potential perforated appendicitis. 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 She was started on broad-spectrum antibiotics. GI and general surgery were consulted.  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 Appendicitis, possible rectal mass not completely ruled out -Currently on Zosyn. general surgery following.  Status post flexible sigmoidoscopy on 10/30/2019 which showed diverticulosis in the rectosigmoid colon and in the sigmoid colon along with stenosis/narrowing in the rectosigmoid colon and the sigmoid colon in the region of diverticulosis; biopsies were taken. GI signed off on 10/31/2019. Outpatient follow-up with GI for full colonoscopy. - Cultures negative so far. Leukocytosis has resolved. -CT of the abdomen and pelvis repeated on 10/31/2019 still shows centrally located abscess within the pelvis which is increased in size and is not amenable to percutaneous drainage given its location.  -General surgery is deciding about the timing of surgery.  Follow further recommendations  Hypertension -Blood pressure stable. Home  losartan and metoprolol on hold  Hypokalemia -Replace  Macrocytic anemia -Folate, vitamin B12 and ferritin and iron within normal limit. Monitor CBC intermittently.  Hemoglobin stable currently.  Hyperlipidemia -Continue statin  Anxiety/depression/history of seizure disorder -Outpatient follow-up  COPD -Stable.   Tobacco abuse -Patient was counseled regarding cessation by prior hospitalist. Continue nicotine patch   DVT prophylaxis: Lovenox Code Status: Full Family Communication: Patient at bedside Disposition Plan: Status is: Inpatient  Remains inpatient appropriate because:Inpatient level of care appropriate due to severity of illness   Dispo: The patient is from: Home              Anticipated d/c is to: Home              Anticipated d/c date is: > 3 days              Patient currently is not medically stable to d/c.   Consultants: GI/general surgery  Procedures:  Flexible sigmoidoscopy on 10/30/2019 Impression:               - Preparation of the colon was inadequate.                           - Hemorrhoids found on digital rectal exam.                           - Stool in the entire examined colon. Lavaged.                           - Diverticulosis in the recto-sigmoid colon and in  the sigmoid colon. Overt diverticulitis was not                            noted.                           - Stenosis/Narrowing in the recto-sigmoid colon and                            in the sigmoid colon in region of diverticulosis.                           - Normal mucosa in the entire examined colon.                            Biopsied to rule out chronic colitis/chronic                            proctitis.                           - Non-bleeding non-thrombosed external and internal                            hemorrhoids. Moderate Sedation:      Not Applicable - Patient had care per Anesthesia. Recommendation:           - The patient will be  observed post-procedure,                            until all discharge criteria are met.                           - Return patient to hospital ward for ongoing care.                           - Resume previous diet.                           - Await pathology results.                           - Based on endoscopic evaluation today, the                            underlying pathology does not look to be IBD                            related. Will await the pathology, but feel                            diverticular related issues seem most likely.                           - Further workup/management as per surgical service.                           -  The findings and recommendations were discussed                            with the patient.                           - The findings and recommendations were discussed                            with the referring provider.  Antimicrobials:  Anti-infectives (From admission, onward)   Start     Dose/Rate Route Frequency Ordered Stop   10/25/19 1000  vancomycin (VANCOCIN) IVPB 1000 mg/200 mL premix  Status:  Discontinued        1,000 mg 200 mL/hr over 60 Minutes Intravenous Every 12 hours 10/24/19 1835 10/28/19 1727   10/24/19 2300  piperacillin-tazobactam (ZOSYN) IVPB 3.375 g        3.375 g 12.5 mL/hr over 240 Minutes Intravenous Every 8 hours 10/24/19 1835     10/24/19 2000  vancomycin (VANCOREADY) IVPB 1500 mg/300 mL  Status:  Discontinued        1,500 mg 150 mL/hr over 120 Minutes Intravenous  Once 10/24/19 1835 10/24/19 1836   10/24/19 2000  vancomycin (VANCOREADY) IVPB 1500 mg/300 mL        1,500 mg 150 mL/hr over 120 Minutes Intravenous  Once 10/24/19 1836 10/24/19 2229   10/24/19 1730  piperacillin-tazobactam (ZOSYN) IVPB 3.375 g        3.375 g 100 mL/hr over 30 Minutes Intravenous  Once 10/24/19 1721 10/24/19 1810       Subjective: Patient seen and examined at bedside.  No overnight fever, vomiting, worsening shortness  of breath.  Still complains of lower abdominal pain. Objective: Vitals:   11/01/19 0628 11/01/19 1326 11/01/19 2158 11/02/19 0621  BP: (!) 106/94 118/74 (!) 148/81 135/84  Pulse: 77 82 73 78  Resp: '14 17 17 17  ' Temp: 98.7 F (37.1 C) 98.2 F (36.8 C) 98.3 F (36.8 C) 98.3 F (36.8 C)  TempSrc: Oral Oral Oral Oral  SpO2: 97% 98% 98% 97%  Weight:      Height:        Intake/Output Summary (Last 24 hours) at 11/02/2019 0810 Last data filed at 11/01/2019 2256 Gross per 24 hour  Intake 1642.44 ml  Output --  Net 1642.44 ml   Filed Weights   10/24/19 0751  Weight: 78.5 kg    Examination:  General exam: No acute distress. Respiratory system: Bilateral decreased breath sounds at bases with no wheezing cardiovascular system: Rate controlled, S1-S2 heard gastrointestinal system: Abdomen is nondistended, soft and tender in the lower quadrant.  Normal bowel sounds heard  extremities: No lower extremity edema or clubbing.  Data Reviewed: I have personally reviewed following labs and imaging studies  CBC: Recent Labs  Lab 10/29/19 0320 10/30/19 0347 10/31/19 0427 11/01/19 0326 11/02/19 0345  WBC 4.9 6.5 6.3 7.9 5.7  NEUTROABS  --   --  3.0 4.2 2.7  HGB 8.5* 9.3* 9.3* 8.9* 7.9*  HCT 28.1* 30.3* 30.0* 29.6* 25.6*  MCV 103.3* 102.7* 102.0* 103.1* 102.4*  PLT 325 326 324 358 801   Basic Metabolic Panel: Recent Labs  Lab 10/27/19 0327 10/27/19 0327 10/28/19 0418 10/28/19 0418 10/29/19 0320 10/30/19 0347 10/31/19 0427 11/01/19 0326 11/02/19 0345  NA 143   < > 142   < >  141 140 139 139 139  K 3.9   < > 4.0   < > 3.8 3.7 3.5 3.3* 3.4*  CL 108   < > 107   < > 107 108 107 108 105  CO2 27   < > 26   < > '27 23 27 22 25  ' GLUCOSE 95   < > 99   < > 96 84 101* 95 104*  BUN <5*   < > 6   < > 5* <5* 5* <5* 6  CREATININE 0.67   < > 0.67   < > 0.80 0.70 0.81 0.70 0.64  CALCIUM 9.3   < > 9.4   < > 9.1 8.9 9.1 8.9 8.5*  MG 1.8  --  1.7  --   --   --  1.8 1.7 1.7  PHOS 5.0*  --   5.0*  --   --   --   --   --   --    < > = values in this interval not displayed.   GFR: Estimated Creatinine Clearance: 86.4 mL/min (by C-G formula based on SCr of 0.64 mg/dL). Liver Function Tests: Recent Labs  Lab 10/28/19 0418 10/31/19 0427  AST 13* 12*  ALT 10 9  ALKPHOS 76 94  BILITOT 0.3 0.6  PROT 6.0* 5.9*  ALBUMIN 2.7* 2.8*   No results for input(s): LIPASE, AMYLASE in the last 168 hours. No results for input(s): AMMONIA in the last 168 hours. Coagulation Profile: No results for input(s): INR, PROTIME in the last 168 hours. Cardiac Enzymes: No results for input(s): CKTOTAL, CKMB, CKMBINDEX, TROPONINI in the last 168 hours. BNP (last 3 results) No results for input(s): PROBNP in the last 8760 hours. HbA1C: No results for input(s): HGBA1C in the last 72 hours. CBG: No results for input(s): GLUCAP in the last 168 hours. Lipid Profile: No results for input(s): CHOL, HDL, LDLCALC, TRIG, CHOLHDL, LDLDIRECT in the last 72 hours. Thyroid Function Tests: No results for input(s): TSH, T4TOTAL, FREET4, T3FREE, THYROIDAB in the last 72 hours. Anemia Panel: No results for input(s): VITAMINB12, FOLATE, FERRITIN, TIBC, IRON, RETICCTPCT in the last 72 hours. Sepsis Labs: No results for input(s): PROCALCITON, LATICACIDVEN in the last 168 hours.  Recent Results (from the past 240 hour(s))  SARS Coronavirus 2 by RT PCR (hospital order, performed in [facility] hospital lab) Nasopharyngeal Nasopharyngeal Swab     Status: None   Collection Time: 10/24/19  3:48 PM   Specimen: Nasopharyngeal Swab  Result Value Ref Range Status   SARS Coronavirus 2 NEGATIVE NEGATIVE Final    Comment: (NOTE) SARS-CoV-2 target nucleic acids are NOT DETECTED.  The SARS-CoV-2 RNA is generally detectable in upper and lower respiratory specimens during the acute phase of infection. The lowest concentration of SARS-CoV-2 viral copies this assay can detect is 250 copies / mL. A negative result does not  preclude SARS-CoV-2 infection and should not be used as the sole basis for treatment or other patient management decisions.  A negative result may occur with improper specimen collection / handling, submission of specimen other than nasopharyngeal swab, presence of viral mutation(s) within the areas targeted by this assay, and inadequate number of viral copies (<250 copies / mL). A negative result must be combined with clinical observations, patient history, and epidemiological information.  Fact Sheet for Patients:   StrictlyIdeas.no  Fact Sheet for Healthcare Providers: BankingDealers.co.za  This test is not yet approved or  cleared by the [place] FDA and has  been authorized for detection and/or diagnosis of SARS-CoV-2 by FDA under an Emergency Use Authorization (EUA).  This EUA will remain in effect (meaning this test can be used) for the duration of the COVID-19 declaration under Section 564(b)(1) of the Act, 21 U.S.C. section 360bbb-3(b)(1), unless the authorization is terminated or revoked sooner.  Performed at [facility], [place] [address] [place], [place] [address]   Culture, blood (routine x 2)     Status: None   Collection Time: 10/24/19  7:46 PM   Specimen: BLOOD  Result Value Ref Range Status   Specimen Description   Final    BLOOD RIGHT ANTECUBITAL Performed at [place] [address], [place], [place] [address]    Special Requests   Final    BOTTLES DRAWN AEROBIC AND ANAEROBIC Blood Culture adequate volume Performed at [place] [address], [place], [place] [address]    Culture   Final    NO GROWTH 5 DAYS Performed at [facility], [place] [address], [place], [place] [address]    Report Status 10/29/2019 FINAL  Final  Culture, blood (routine x 2)     Status: None   Collection Time: 10/24/19  7:52 PM   Specimen: BLOOD  Result Value Ref  Range Status   Specimen Description   Final    BLOOD BLOOD RIGHT HAND Performed at [place] [address] [place], [place] [address]    Special Requests   Final    BOTTLES DRAWN AEROBIC AND ANAEROBIC Blood Culture adequate volume Performed at [place] [address] [place], [place] [address]    Culture   Final    NO GROWTH 5 DAYS Performed at [facility], [place] [address] [place], [place] [address]    Report Status 10/29/2019 FINAL  Final  Surgical PCR screen     Status: None   Collection Time: 10/25/19 12:14 AM   Specimen: Nasal Mucosa; Nasal Swab  Result Value Ref Range Status   MRSA, PCR NEGATIVE NEGATIVE Final   Staphylococcus aureus NEGATIVE NEGATIVE Final    Comment: (NOTE) The Xpert SA Assay (FDA approved for NASAL specimens in patients 20 years of age and older), is one component of a comprehensive surveillance program. It is not intended to diagnose infection nor to guide or monitor treatment. Performed at [facility], [place] [address], [place], [place] [address]          Radiology Studies: CT ABDOMEN PELVIS W CONTRAST  Result Date: 10/31/2019 CLINICAL DATA:  Persistent abdominal pain in the suprapubic region. Right lower quadrant pain. Right buttock and back pain. EXAM: CT ABDOMEN AND PELVIS WITH CONTRAST TECHNIQUE: Multidetector CT imaging of the abdomen and pelvis was performed using the standard protocol following bolus administration of intravenous contrast. CONTRAST:  136m OMNIPAQUE IOHEXOL 300 MG/ML  SOLN COMPARISON:  10/23/2019 FINDINGS: Lower chest: The lung bases are clear. The heart size is normal. Hepatobiliary: The liver is normal. Normal gallbladder.There is no biliary ductal dilation. Pancreas: Normal contours without ductal dilatation. No peripancreatic fluid collection. Spleen: Unremarkable. Adrenals/Urinary Tract: --Adrenal glands: Unremarkable. --Right kidney/ureter: No  hydronephrosis or radiopaque kidney stones. --Left kidney/ureter: No hydronephrosis or radiopaque kidney stones. --Urinary bladder: There is mild wall thickening of the urinary bladder which is likely reactive. Stomach/Bowel: --Stomach/Duodenum: No hiatal hernia or other gastric abnormality. Normal duodenal course and caliber. --Small bowel: Unremarkable. --Colon: Again noted are extensive inflammatory changes in the  patient's pelvis with diffuse circumferential wall thickening of the sigmoid colon with findings consistent with fistulous tracks between the rectum and sigmoid colon (axial series 2, image 69). Again noted is a centrally located air and fluid collection in the patient's pelvis measuring 4.5 x 4.4 cm this collection has increased in size from prior study when it measured approximately 3.5 x 3 cm. Scattered colonic diverticula are again noted --Appendix: There is persistent diffuse circumferential wall thickening of the appendix which measures up to approximately 1.3 cm. Vascular/Lymphatic: Atherosclerotic calcification is present within the non-aneurysmal abdominal aorta, without hemodynamically significant stenosis. --there are prominent but non pathologically enlarged retroperitoneal lymph nodes. --No mesenteric lymphadenopathy. --there are few mildly enlarged pelvic lymph nodes which are stable from prior study and are favored to be reactive in etiology. Reproductive: There is a stable 3.2 cm cystic mass closely associated with the right ovary. Other: There is a small amount of reactive free fluid in the patient's pelvis. The abdominal wall is normal. Musculoskeletal. There is severe degenerative changes at the L2-L3 level. IMPRESSION: 1. Persistent extensive infectious/inflammatory changes in the patient's pelvis as previously described. These inflammatory changes have not significantly improved from the prior study. Again noted is a centrally located abscess within the pelvis which has demonstrated  interval increase in size. This abscess is not amenable to percutaneous drainage given its location. 2. Otherwise, stable exam from prior study. Electronically Signed   By: Constance Todd M.D.   On: 10/31/2019 17:20        Scheduled Meds: . aspirin EC  81 mg Oral Daily  . atorvastatin  10 mg Oral QPM  . calcium-vitamin D  1 tablet Oral QHS  . enoxaparin (LOVENOX) injection  40 mg Subcutaneous Q24H  . feeding supplement  1 Container Oral TID BM  . loratadine  10 mg Oral Daily  . multivitamin with minerals  1 tablet Oral QHS  . nicotine  7 mg Transdermal Daily  . polyethylene glycol  17 g Oral Daily  . sodium chloride flush  10-40 mL Intracatheter Q12H   Continuous Infusions: . lactated ringers    . lactated ringers 75 mL/hr at 11/01/19 1834  . piperacillin-tazobactam (ZOSYN)  IV 3.375 g (11/02/19 0600)          Aline August, MD [facility] 11/02/2019, 8:10 AM

## 2019-11-02 NOTE — Progress Notes (Signed)
3 Days Post-Op   Subjective/Chief Complaint: Pt doing well this AM No complaints   Objective: Vital signs in last 24 hours: Temp:  [98.2 F (36.8 C)-98.3 F (36.8 C)] 98.3 F (36.8 C) (09/11 0621) Pulse Rate:  [73-82] 78 (09/11 0621) Resp:  [17] 17 (09/11 0621) BP: (118-148)/(74-84) 135/84 (09/11 0621) SpO2:  [97 %-98 %] 97 % (09/11 0621) Last BM Date: 11/01/19  Intake/Output from previous day: 09/10 0701 - 09/11 0700 In: 1642.4 [P.O.:720; I.V.:822.4; IV Piggyback:100.1] Out: -  Intake/Output this shift: No intake/output data recorded.  PE:  Constitutional: No acute distress, conversant, appears states age. Eyes: Anicteric sclerae, moist conjunctiva, no lid lag Lungs: Clear to auscultation bilaterally, normal respiratory effort CV: regular rate and rhythm, no murmurs, no peripheral edema, pedal pulses 2+ GI: Soft, no masses or hepatosplenomegaly, non-tender to palpation Skin: No rashes, palpation reveals normal turgor Psychiatric: appropriate judgment and insight, oriented to person, place, and time   Lab Results:  Recent Labs    11/01/19 0326 11/02/19 0345  WBC 7.9 5.7  HGB 8.9* 7.9*  HCT 29.6* 25.6*  PLT 358 283   BMET Recent Labs    11/01/19 0326 11/02/19 0345  NA 139 139  K 3.3* 3.4*  CL 108 105  CO2 22 25  GLUCOSE 95 104*  BUN <5* 6  CREATININE 0.70 0.64  CALCIUM 8.9 8.5*   PT/INR No results for input(s): LABPROT, INR in the last 72 hours. ABG No results for input(s): PHART, HCO3 in the last 72 hours.  Invalid input(s): PCO2, PO2  Studies/Results: CT ABDOMEN PELVIS W CONTRAST  Result Date: 10/31/2019 CLINICAL DATA:  Persistent abdominal pain in the suprapubic region. Right lower quadrant pain. Right buttock and back pain. EXAM: CT ABDOMEN AND PELVIS WITH CONTRAST TECHNIQUE: Multidetector CT imaging of the abdomen and pelvis was performed using the standard protocol following bolus administration of intravenous contrast. CONTRAST:   OMNIPAQUE IOHEXOL 300 MG/ML  SOLN COMPARISON:  10/23/2019 FINDINGS: Lower chest: The lung bases are clear. The heart size is normal. Hepatobiliary: The liver is normal. Normal gallbladder.There is no biliary ductal dilation. Pancreas: Normal contours without ductal dilatation. No peripancreatic fluid collection. Spleen: Unremarkable. Adrenals/Urinary Tract: --Adrenal glands: Unremarkable. --Right kidney/ureter: No hydronephrosis or radiopaque kidney stones. --Left kidney/ureter: No hydronephrosis or radiopaque kidney stones. --Urinary bladder: There is mild wall thickening of the urinary bladder which is likely reactive. Stomach/Bowel: --Stomach/Duodenum: No hiatal hernia or other gastric abnormality. Normal duodenal course and caliber. --Small bowel: Unremarkable. --Colon: Again noted are extensive inflammatory changes in the patient's pelvis with diffuse circumferential wall thickening of the sigmoid colon with findings consistent with fistulous tracks between the rectum and sigmoid colon (axial series 2, image 69). Again noted is a centrally located air and fluid collection in the patient's pelvis measuring 4.5 x 4.4 cm this collection has increased in size from prior study when it measured approximately 3.5 x 3 cm. Scattered colonic diverticula are again noted --Appendix: There is persistent diffuse circumferential wall thickening of the appendix which measures up to approximately 1.3 cm. Vascular/Lymphatic: Atherosclerotic calcification is present within the non-aneurysmal abdominal aorta, without hemodynamically significant stenosis. --there are prominent but non pathologically enlarged retroperitoneal lymph nodes. --No mesenteric lymphadenopathy. --there are few mildly enlarged pelvic lymph nodes which are stable from prior study and are favored to be reactive in etiology. Reproductive: There is a stable 3.2 cm cystic mass closely associated with the right ovary. Other: There is a small amount of reactive  free fluid  in the patient's pelvis. The abdominal wall is normal. Musculoskeletal. There is severe degenerative changes at the L2-L3 level. IMPRESSION: 1. Persistent extensive infectious/inflammatory changes in the patient's pelvis as previously described. These inflammatory changes have not significantly improved from the prior study. Again noted is a centrally located abscess within the pelvis which has demonstrated interval increase in size. This abscess is not amenable to percutaneous drainage given its location. 2. Otherwise, stable exam from prior study. Electronically Signed   By: Katherine Mantle M.D.   On: 10/31/2019 17:20    Anti-infectives: Anti-infectives (From admission, onward)   Start     Dose/Rate Route Frequency Ordered Stop   10/25/19 1000  vancomycin (VANCOCIN) IVPB 1000 mg/200 mL premix  Status:  Discontinued        1,000 mg 200 mL/hr over 60 Minutes Intravenous Every 12 hours 10/24/19 1835 10/28/19 1727   10/24/19 2300  piperacillin-tazobactam (ZOSYN) IVPB 3.375 g        3.375 g 12.5 mL/hr over 240 Minutes Intravenous Every 8 hours 10/24/19 1835     10/24/19 2000  vancomycin (VANCOREADY) IVPB 1500 mg/300 mL  Status:  Discontinued        1,500 mg 150 mL/hr over 120 Minutes Intravenous  Once 10/24/19 1835 10/24/19 1836   10/24/19 2000  vancomycin (VANCOREADY) IVPB 1500 mg/300 mL        1,500 mg 150 mL/hr over 120 Minutes Intravenous  Once 10/24/19 1836 10/24/19 2229   10/24/19 1730  piperacillin-tazobactam (ZOSYN) IVPB 3.375 g        3.375 g 100 mL/hr over 30 Minutes Intravenous  Once 10/24/19 1721 10/24/19 1810      Assessment/Plan: Hypertension Hyperlipidemia Hx seizures Hx COPD/tobacco use Hx anxiety depression   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 3.5 cm fluid collection posterior to the sigmoid colon, 3.2 cm fluid collection in the RLQ. Neither were  amenable to IR drainage per chart review. - s/p flex sig 9/8 which showed recto-sigmoid narrowing without a mass. Bx negative for malignancy. -  CT repeated 9/9 due to lack of clinical improvement, showed interval increase in pelvic fluid collection, not amenable to IR drainage.  - At this point I do not think the inflammatory process in the patients pelvis is going to improve without fecal diversion. This was discussed with Dr. Modesto Charon and at this point we recommend that the patient go to the OR for fecal diversion, washout of the pelvis abscess, and placement of blake drain. Likely plan for bowel prep and surgery next week by Dr. Cliffton Asters  FEN: FLD, add boost breeze. ID: Zosyn 9/2  >> day 9 DVT: Lovenox Follow-up: Dr. Romie Levee    LOS: 9 days    [name] 11/02/2019

## 2019-11-03 LAB — CREATININE, SERUM
Creatinine, Ser: 0.62 mg/dL (ref 0.44–1.00)
GFR calc Af Amer: >60 mL/min (ref >60)
GFR calc non Af Amer: >60 mL/min (ref >60)

## 2019-11-03 MED ORDER — SODIUM CHLORIDE 0.9 % IV SOLN: 2.0000 g | INTRAVENOUS | Status: DC

## 2019-11-03 MED ORDER — POLYETHYLENE GLYCOL 3350 17 GM/SCOOP PO POWD
1.0000 | Freq: Once | ORAL | Status: AC
  Administered 2019-11-03: 255 g via ORAL
  Filled 2019-11-03: qty 255

## 2019-11-03 MED ORDER — POLYETHYLENE GLYCOL 3350 17 GM/SCOOP PO POWD: 1.0000 | Freq: Once | ORAL | Status: DC

## 2019-11-03 NOTE — Progress Notes (Signed)
4 Days Post-Op   Subjective/Chief Complaint: Pt with some pain last night Cont' with BMs   Objective: Vital signs in last 24 hours: Temp:  [98 F (36.7 C)-99 F (37.2 C)] 98.3 F (36.8 C) (09/12 0630) Pulse Rate:  [71-79] 71 (09/12 0630) Resp:  [14-17] 17 (09/12 0630) BP: (130-142)/(74-93) 137/80 (09/12 0630) SpO2:  [95 %-99 %] 95 % (09/12 0630) Last BM Date: 11/01/19  Intake/Output from previous day: 09/11 0701 - 09/12 0700 In: 361.4 [P.O.:240; I.V.:20; IV Piggyback:101.4] Out: 0  Intake/Output this shift: No intake/output data recorded.  PE:  Constitutional: No acute distress, conversant, appears states age. Eyes: Anicteric sclerae, moist conjunctiva, no lid lag Lungs: Clear to auscultation bilaterally, normal respiratory effort CV: regular rate and rhythm, no murmurs, no peripheral edema, pedal pulses 2+ GI: Soft, no masses or hepatosplenomegaly, non-tender to palpation Skin: No rashes, palpation reveals normal turgor Psychiatric: appropriate judgment and insight, oriented to person, place, and time   Lab Results:  Recent Labs    11/01/19 0326 11/02/19 0345  WBC 7.9 5.7  HGB 8.9* 7.9*  HCT 29.6* 25.6*  PLT 358 283   BMET Recent Labs    11/01/19 0326 11/01/19 0326 11/02/19 0345 11/03/19 0327  NA 139  --  139  --   K 3.3*  --  3.4*  --   CL 108  --  105  --   CO2 22  --  25  --   GLUCOSE 95  --  104*  --   BUN <5*  --  6  --   CREATININE 0.70   < > 0.64 0.62  CALCIUM 8.9  --  8.5*  --    < > = values in this interval not displayed.   PT/INR No results for input(s): LABPROT, INR in the last 72 hours. ABG No results for input(s): PHART, HCO3 in the last 72 hours.  Invalid input(s): PCO2, PO2  Studies/Results: No results found.  Anti-infectives: Anti-infectives (From admission, onward)   Start     Dose/Rate Route Frequency Ordered Stop   11/03/19 0845  cefoTEtan (CEFOTAN) 2 g in sodium chloride 0.9 % 100 mL IVPB  Status:  Discontinued         2 g 200 mL/hr over 30 Minutes Intravenous On call to O.R. 11/03/19 0831 11/03/19 0842   10/25/19 1000  vancomycin (VANCOCIN) IVPB 1000 mg/200 mL premix  Status:  Discontinued        1,000 mg 200 mL/hr over 60 Minutes Intravenous Every 12 hours 10/24/19 1835 10/28/19 1727   10/24/19 2300  piperacillin-tazobactam (ZOSYN) IVPB 3.375 g        3.375 g 12.5 mL/hr over 240 Minutes Intravenous Every 8 hours 10/24/19 1835     10/24/19 2000  vancomycin (VANCOREADY) IVPB 1500 mg/300 mL  Status:  Discontinued        1,500 mg 150 mL/hr over 120 Minutes Intravenous  Once 10/24/19 1835 10/24/19 1836   10/24/19 2000  vancomycin (VANCOREADY) IVPB 1500 mg/300 mL        1,500 mg 150 mL/hr over 120 Minutes Intravenous  Once 10/24/19 1836 10/24/19 2229   10/24/19 1730  piperacillin-tazobactam (ZOSYN) IVPB 3.375 g        3.375 g 100 mL/hr over 30 Minutes Intravenous  Once 10/24/19 1721 10/24/19 1810      Assessment/Plan: Hypertension Hyperlipidemia Hx seizures Hx COPD/tobacco use Hx anxiety depression   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 3.5 cm fluid collection posterior to the sigmoid colon, 3.2 cm fluid collection in the RLQ. Neither were amenable to IR drainage per chart review. - s/p flex sig 9/8 which showed recto-sigmoid narrowing without a mass. Bx negative for malignancy. - CT repeated 9/9 due to lack of clinical improvement, showed interval increase in pelvic fluid collection, not amenable to IR drainage.  - At this point I do not think the inflammatory process in the patients pelvis is going to improve without fecal diversion. This was discussed with Dr. Modesto Charon and at this point we recommend that the patient go to the OR for fecal diversion, washout of the pelvis abscess, and placement of blake drain. Plan for bowel prep today and surgery Monday by Dr. Cliffton Asters  FEN: FLD, add boost  breeze. ID: Zosyn 9/2 >>day 9 DVT: Lovenox Follow-up:Dr. Romie Levee   LOS: 10 days    [name] 11/03/2019

## 2019-11-03 NOTE — Progress Notes (Signed)
Patient ID: Susan Todd, female   DOB: Apr 11, 1965, 54 y.o.   MRN: [id number]  PROGRESS NOTE    Susan Todd  MWN:[id number] DOB: 1965/06/28 DOA: 10/24/2019 PCP: Wenda Low, MD   Brief Narrative:  54 y.o. female with a history of HTN/HLD, anxiety/depression,seizure disorder, COPDwith history of previous diverticular disease who presented on 10/24/19 with worsening abdominal pain. Pain going on since early July initially with vaginal discharge, seen by her gynecologist and had multiple evaluation and was on different antibiotics subsequently referred to GI. CT of the abdomen and pelvis done on 10/23/2019 showed several abscesses within the abdomen coming from vagina/rectal region with concern for potential perforated appendicitis. 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 She was started on broad-spectrum antibiotics. GI and general surgery were consulted.  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 Appendicitis, possible rectal mass not completely ruled out -Currently on Zosyn.    -Status post flexible sigmoidoscopy on 10/30/2019 which showed diverticulosis in the rectosigmoid colon and in the sigmoid colon along with stenosis/narrowing in the rectosigmoid colon and the sigmoid colon in the region of diverticulosis; biopsies were taken. GI signed off on 10/31/2019. Outpatient follow-up with GI for full colonoscopy. - Cultures negative so far. Leukocytosis has resolved. -CT of the abdomen and pelvis repeated on 10/31/2019 still shows centrally located abscess within the pelvis which is increased in size and is not amenable to percutaneous drainage given its location.  -General surgery is deciding about the timing of surgery, possibly early next week.  Follow further recommendations  Hypertension -Blood pressure stable. Home  losartan and metoprolol on hold  Hypokalemia -Replace  Macrocytic anemia -Folate, vitamin B12 and ferritin and iron within normal limit. Monitor CBC intermittently.  Hemoglobin stable currently.  Hyperlipidemia -Continue statin  Anxiety/depression/history of seizure disorder -Outpatient follow-up  COPD -Stable.   Tobacco abuse -Patient was counseled regarding cessation by prior hospitalist. Continue nicotine patch   DVT prophylaxis: Lovenox Code Status: Full Family Communication: Patient at bedside Disposition Plan: Status is: Inpatient  Remains inpatient appropriate because:Inpatient level of care appropriate due to severity of illness   Dispo: The patient is from: Home              Anticipated d/c is to: Home              Anticipated d/c date is: > 3 days              Patient currently is not medically stable to d/c.   Consultants: GI/general surgery  Procedures:  Flexible sigmoidoscopy on 10/30/2019 Impression:               - Preparation of the colon was inadequate.                           - Hemorrhoids found on digital rectal exam.                           - Stool in the entire examined colon. Lavaged.                           - Diverticulosis in the recto-sigmoid colon and in  the sigmoid colon. Overt diverticulitis was not                            noted.                           - Stenosis/Narrowing in the recto-sigmoid colon and                            in the sigmoid colon in region of diverticulosis.                           - Normal mucosa in the entire examined colon.                            Biopsied to rule out chronic colitis/chronic                            proctitis.                           - Non-bleeding non-thrombosed external and internal                            hemorrhoids. Moderate Sedation:      Not Applicable - Patient had care per Anesthesia. Recommendation:           - The patient will be  observed post-procedure,                            until all discharge criteria are met.                           - Return patient to hospital ward for ongoing care.                           - Resume previous diet.                           - Await pathology results.                           - Based on endoscopic evaluation today, the                            underlying pathology does not look to be IBD                            related. Will await the pathology, but feel                            diverticular related issues seem most likely.                           - Further workup/management as per surgical service.                           -  The findings and recommendations were discussed                            with the patient.                           - The findings and recommendations were discussed                            with the referring provider.  Antimicrobials:  Anti-infectives (From admission, onward)   Start     Dose/Rate Route Frequency Ordered Stop   10/25/19 1000  vancomycin (VANCOCIN) IVPB 1000 mg/200 mL premix  Status:  Discontinued        1,000 mg 200 mL/hr over 60 Minutes Intravenous Every 12 hours 10/24/19 1835 10/28/19 1727   10/24/19 2300  piperacillin-tazobactam (ZOSYN) IVPB 3.375 g        3.375 g 12.5 mL/hr over 240 Minutes Intravenous Every 8 hours 10/24/19 1835     10/24/19 2000  vancomycin (VANCOREADY) IVPB 1500 mg/300 mL  Status:  Discontinued        1,500 mg 150 mL/hr over 120 Minutes Intravenous  Once 10/24/19 1835 10/24/19 1836   10/24/19 2000  vancomycin (VANCOREADY) IVPB 1500 mg/300 mL        1,500 mg 150 mL/hr over 120 Minutes Intravenous  Once 10/24/19 1836 10/24/19 2229   10/24/19 1730  piperacillin-tazobactam (ZOSYN) IVPB 3.375 g        3.375 g 100 mL/hr over 30 Minutes Intravenous  Once 10/24/19 1721 10/24/19 1810       Subjective: Patient seen and examined at bedside.  Denies worsening abdominal pain, fever, vomiting.   No shortness of breath reported.  Objective: Vitals:   11/02/19 1200 11/02/19 1332 11/02/19 2300 11/03/19 0630  BP: (!) 142/76 (!) 140/93 130/74 137/80  Pulse: 79 79 79 71  Resp: '16 16 14 17  ' Temp: 98.9 F (37.2 C) 98 F (36.7 C) 99 F (37.2 C) 98.3 F (36.8 C)  TempSrc: Oral Oral Oral Oral  SpO2: 99% 99% 98% 95%  Weight:      Height:        Intake/Output Summary (Last 24 hours) at 11/03/2019 0754 Last data filed at 11/03/2019 0251 Gross per 24 hour  Intake 361.35 ml  Output 0 ml  Net 361.35 ml   Filed Weights   10/24/19 0751  Weight: 78.5 kg    Examination:  General exam: No distress.   Respiratory system: Bilateral decreased breath sounds at bases with some scattered crackles  cardiovascular system: S1-S2 heard, rate controlled  gastrointestinal system: Abdomen is nondistended, soft and mildly tender in the lower quadrant.  Bowel sounds are heard extremities: No cyanosis, clubbing or edema  Data Reviewed: I have personally reviewed following labs and imaging studies  CBC: Recent Labs  Lab 10/29/19 0320 10/30/19 0347 10/31/19 0427 11/01/19 0326 11/02/19 0345  WBC 4.9 6.5 6.3 7.9 5.7  NEUTROABS  --   --  3.0 4.2 2.7  HGB 8.5* 9.3* 9.3* 8.9* 7.9*  HCT 28.1* 30.3* 30.0* 29.6* 25.6*  MCV 103.3* 102.7* 102.0* 103.1* 102.4*  PLT 325 326 324 358 606   Basic Metabolic Panel: Recent Labs  Lab 10/28/19 0418 10/28/19 0418 10/29/19 0320 10/29/19 0320 10/30/19 0347 10/31/19 0427 11/01/19 0326 11/02/19 0345 11/03/19 0327  NA 142   < > 141  --  140 139 139 139  --   K 4.0   < > 3.8  --  3.7 3.5 3.3* 3.4*  --   CL 107   < > 107  --  108 107 108 105  --   CO2 26   < > 27  --  '23 27 22 25  ' --   GLUCOSE 99   < > 96  --  84 101* 95 104*  --   BUN 6   < > 5*  --  <5* 5* <5* 6  --   CREATININE 0.67   < > 0.80   < > 0.70 0.81 0.70 0.64 0.62  CALCIUM 9.4   < > 9.1  --  8.9 9.1 8.9 8.5*  --   MG 1.7  --   --   --   --  1.8 1.7 1.7  --   PHOS 5.0*  --   --   --   --    --   --   --   --    < > = values in this interval not displayed.   GFR: Estimated Creatinine Clearance: 86.4 mL/min (by C-G formula based on SCr of 0.62 mg/dL). Liver Function Tests: Recent Labs  Lab 10/28/19 0418 10/31/19 0427  AST 13* 12*  ALT 10 9  ALKPHOS 76 94  BILITOT 0.3 0.6  PROT 6.0* 5.9*  ALBUMIN 2.7* 2.8*   No results for input(s): LIPASE, AMYLASE in the last 168 hours. No results for input(s): AMMONIA in the last 168 hours. Coagulation Profile: No results for input(s): INR, PROTIME in the last 168 hours. Cardiac Enzymes: No results for input(s): CKTOTAL, CKMB, CKMBINDEX, TROPONINI in the last 168 hours. BNP (last 3 results) No results for input(s): PROBNP in the last 8760 hours. HbA1C: No results for input(s): HGBA1C in the last 72 hours. CBG: No results for input(s): GLUCAP in the last 168 hours. Lipid Profile: No results for input(s): CHOL, HDL, LDLCALC, TRIG, CHOLHDL, LDLDIRECT in the last 72 hours. Thyroid Function Tests: No results for input(s): TSH, T4TOTAL, FREET4, T3FREE, THYROIDAB in the last 72 hours. Anemia Panel: No results for input(s): VITAMINB12, FOLATE, FERRITIN, TIBC, IRON, RETICCTPCT in the last 72 hours. Sepsis Labs: No results for input(s): PROCALCITON, LATICACIDVEN in the last 168 hours.  Recent Results (from the past 240 hour(s))  SARS Coronavirus 2 by RT PCR (hospital order, performed in [facility] hospital lab) Nasopharyngeal Nasopharyngeal Swab     Status: None   Collection Time: 10/24/19  3:48 PM   Specimen: Nasopharyngeal Swab  Result Value Ref Range Status   SARS Coronavirus 2 NEGATIVE NEGATIVE Final    Comment: (NOTE) SARS-CoV-2 target nucleic acids are NOT DETECTED.  The SARS-CoV-2 RNA is generally detectable in upper and lower respiratory specimens during the acute phase of infection. The lowest concentration of SARS-CoV-2 viral copies this assay can detect is 250 copies / mL. A negative result does not preclude SARS-CoV-2  infection and should not be used as the sole basis for treatment or other patient management decisions.  A negative result may occur with improper specimen collection / handling, submission of specimen other than nasopharyngeal swab, presence of viral mutation(s) within the areas targeted by this assay, and inadequate number of viral copies (<250 copies / mL). A negative result must be combined with clinical observations, patient history, and epidemiological information.  Fact Sheet for Patients:   StrictlyIdeas.no  Fact Sheet for Healthcare Providers: BankingDealers.co.za  This test is not  yet approved or  cleared by the [place] and has been authorized for detection and/or diagnosis of SARS-CoV-2 by FDA under an Emergency Use Authorization (EUA).  This EUA will remain in effect (meaning this test can be used) for the duration of the COVID-19 declaration under Section 564(b)(1) of the Act, 21 U.S.C. section 360bbb-3(b)(1), unless the authorization is terminated or revoked sooner.  Performed at [facility], [place] [address], [place], [place] [address]   Culture, blood (routine x 2)     Status: None   Collection Time: 10/24/19  7:46 PM   Specimen: BLOOD  Result Value Ref Range Status   Specimen Description   Final    BLOOD RIGHT ANTECUBITAL Performed at [place] [address] [place], [place] [address]    Special Requests   Final    BOTTLES DRAWN AEROBIC AND ANAEROBIC Blood Culture adequate volume Performed at [place] [address], [place], [place] [address]    Culture   Final    NO GROWTH 5 DAYS Performed at [facility], [place] [address] [place], [place] [address]    Report Status 10/29/2019 FINAL  Final  Culture, blood (routine x 2)     Status: None   Collection Time: 10/24/19  7:52 PM   Specimen: BLOOD  Result Value Ref Range Status    Specimen Description   Final    BLOOD BLOOD RIGHT HAND Performed at [place] [address], [place], [place] [address]    Special Requests   Final    BOTTLES DRAWN AEROBIC AND ANAEROBIC Blood Culture adequate volume Performed at [place] [address] [place], [place] [address]    Culture   Final    NO GROWTH 5 DAYS Performed at [facility], [place] [address], [place], [place] [address]    Report Status 10/29/2019 FINAL  Final  Surgical PCR screen     Status: None   Collection Time: 10/25/19 12:14 AM   Specimen: Nasal Mucosa; Nasal Swab  Result Value Ref Range Status   MRSA, PCR NEGATIVE NEGATIVE Final   Staphylococcus aureus NEGATIVE NEGATIVE Final    Comment: (NOTE) The Xpert SA Assay (FDA approved for NASAL specimens in patients 29 years of age and older), is one component of a comprehensive surveillance program. It is not intended to diagnose infection nor to guide or monitor treatment. Performed at [facility], [place] [address] [place], [place] [address]          Radiology Studies: No results found.      Scheduled Meds: . aspirin EC  81 mg Oral Daily  . atorvastatin  10 mg Oral QPM  . calcium-vitamin D  1 tablet Oral QHS  . enoxaparin (LOVENOX) injection  40 mg Subcutaneous Q24H  . feeding supplement  1 Container Oral TID BM  . loratadine  10 mg Oral Daily  . multivitamin with minerals  1 tablet Oral QHS  . nicotine  7 mg Transdermal Daily  . polyethylene glycol  17 g Oral Daily  . sodium chloride flush  10-40 mL Intracatheter Q12H   Continuous Infusions: . lactated ringers    . lactated ringers 75 mL/hr at 11/03/19 0532  . piperacillin-tazobactam (ZOSYN)  IV 3.375 g (11/03/19 0251)          Aline August, MD [facility] 11/03/2019, 7:54 AM

## 2019-11-04 ENCOUNTER — Encounter (HOSPITAL_COMMUNITY)
Admission: EM | Disposition: A | Discharge status: Home Health | Payer: Self-pay | Source: Home / Self Care | Attending: Internal Medicine

## 2019-11-04 ENCOUNTER — Inpatient Hospital Stay (HOSPITAL_COMMUNITY): Payer: Self-pay | Admitting: Anesthesiology

## 2019-11-04 ENCOUNTER — Encounter (HOSPITAL_COMMUNITY): Payer: Self-pay | Admitting: Internal Medicine

## 2019-11-04 LAB — TYPE AND SCREEN
ABO/RH(D): O POS
Antibody Screen: NEGATIVE

## 2019-11-04 LAB — SURGICAL PCR SCREEN
MRSA, PCR: NEGATIVE
Staphylococcus aureus: NEGATIVE

## 2019-11-04 LAB — CBC
HCT: 28.5 % — ABNORMAL LOW (ref 36.0–46.0)
Hemoglobin: 8.8 g/dL — ABNORMAL LOW (ref 12.0–15.0)
MCH: 31.1 pg (ref 26.0–34.0)
MCHC: 30.9 g/dL (ref 30.0–36.0)
MCV: 100.7 fL — ABNORMAL HIGH (ref 80.0–100.0)
Platelets: 322 10*3/uL (ref 150–400)
RBC: 2.83 MIL/uL — ABNORMAL LOW (ref 3.87–5.11)
RDW: 14.6 % (ref 11.5–15.5)
WBC: 5.4 10*3/uL (ref 4.0–10.5)
nRBC: 0 % (ref 0.0–0.2)

## 2019-11-04 LAB — CREATININE, SERUM
Creatinine, Ser: 0.58 mg/dL (ref 0.44–1.00)
GFR calc Af Amer: >60 mL/min (ref >60)
GFR calc non Af Amer: >60 mL/min (ref >60)

## 2019-11-04 LAB — ABO/RH: ABO/RH(D): O POS

## 2019-11-04 SURGERY — LAPAROSCOPY, DIAGNOSTIC
Anesthesia: General

## 2019-11-04 MED ORDER — HYDROMORPHONE HCL 1 MG/ML IJ SOLN
0.2500 mg | INTRAMUSCULAR | Status: DC | PRN
  Administered 2019-11-04 (×2): 0.5 mg via INTRAVENOUS

## 2019-11-04 MED ORDER — BUPIVACAINE-EPINEPHRINE (PF) 0.25% -1:200000 IJ SOLN
INTRAMUSCULAR | Status: AC
  Filled 2019-11-04: qty 30

## 2019-11-04 MED ORDER — DEXAMETHASONE SODIUM PHOSPHATE 10 MG/ML IJ SOLN
INTRAMUSCULAR | Status: AC
  Filled 2019-11-04: qty 1

## 2019-11-04 MED ORDER — SUGAMMADEX SODIUM 200 MG/2ML IV SOLN
INTRAVENOUS | Status: DC | PRN
  Administered 2019-11-04: 314 mg via INTRAVENOUS

## 2019-11-04 MED ORDER — ACETAMINOPHEN 325 MG PO TABS
650.0000 mg | ORAL_TABLET | Freq: Four times a day (QID) | ORAL | Status: DC
  Administered 2019-11-04: 650 mg via ORAL
  Filled 2019-11-04 (×3): qty 2

## 2019-11-04 MED ORDER — 0.9 % SODIUM CHLORIDE (POUR BTL) OPTIME
TOPICAL | Status: DC | PRN
  Administered 2019-11-04: 1000 mL

## 2019-11-04 MED ORDER — PROPOFOL 10 MG/ML IV BOLUS
INTRAVENOUS | Status: AC
  Filled 2019-11-04: qty 20

## 2019-11-04 MED ORDER — DEXAMETHASONE SODIUM PHOSPHATE 10 MG/ML IJ SOLN
INTRAMUSCULAR | Status: DC | PRN
  Administered 2019-11-04: 8 mg via INTRAVENOUS

## 2019-11-04 MED ORDER — PROMETHAZINE HCL 25 MG/ML IJ SOLN: 6.2500 mg | INTRAMUSCULAR | Status: DC | PRN

## 2019-11-04 MED ORDER — FENTANYL CITRATE (PF) 250 MCG/5ML IJ SOLN
INTRAMUSCULAR | Status: AC
  Filled 2019-11-04: qty 5

## 2019-11-04 MED ORDER — FENTANYL CITRATE (PF) 100 MCG/2ML IJ SOLN
INTRAMUSCULAR | Status: AC
  Filled 2019-11-04: qty 2

## 2019-11-04 MED ORDER — FENTANYL CITRATE (PF) 100 MCG/2ML IJ SOLN
INTRAMUSCULAR | Status: DC | PRN
Start: 2019-11-04 — End: 2019-11-04
  Administered 2019-11-04 (×3): 50 ug via INTRAVENOUS
  Administered 2019-11-04: 100 ug via INTRAVENOUS
  Administered 2019-11-04 (×3): 50 ug via INTRAVENOUS

## 2019-11-04 MED ORDER — BUPIVACAINE-EPINEPHRINE 0.25% -1:200000 IJ SOLN
INTRAMUSCULAR | Status: DC | PRN
  Administered 2019-11-04: 30 mL

## 2019-11-04 MED ORDER — ONDANSETRON HCL 4 MG/2ML IJ SOLN
INTRAMUSCULAR | Status: AC
  Filled 2019-11-04: qty 2

## 2019-11-04 MED ORDER — BUPIVACAINE LIPOSOME 1.3 % IJ SUSP
20.0000 mL | Freq: Once | INTRAMUSCULAR | Status: AC
  Administered 2019-11-04: 20 mL
  Filled 2019-11-04: qty 20

## 2019-11-04 MED ORDER — MIDAZOLAM HCL 5 MG/5ML IJ SOLN
INTRAMUSCULAR | Status: DC | PRN
  Administered 2019-11-04: 2 mg via INTRAVENOUS

## 2019-11-04 MED ORDER — AMISULPRIDE (ANTIEMETIC) 5 MG/2ML IV SOLN: 10.0000 mg | Freq: Once | INTRAVENOUS | Status: DC | PRN

## 2019-11-04 MED ORDER — ROCURONIUM BROMIDE 10 MG/ML (PF) SYRINGE
PREFILLED_SYRINGE | INTRAVENOUS | Status: DC | PRN
  Administered 2019-11-04: 80 mg via INTRAVENOUS

## 2019-11-04 MED ORDER — HYDROMORPHONE HCL 1 MG/ML IJ SOLN
INTRAMUSCULAR | Status: AC
  Filled 2019-11-04: qty 1

## 2019-11-04 MED ORDER — LACTATED RINGERS IR SOLN
Status: DC | PRN
  Administered 2019-11-04: 1000 mL

## 2019-11-04 MED ORDER — ONDANSETRON HCL 4 MG/2ML IJ SOLN
INTRAMUSCULAR | Status: DC | PRN
  Administered 2019-11-04: 4 mg via INTRAVENOUS

## 2019-11-04 MED ORDER — PHENYLEPHRINE HCL (PRESSORS) 10 MG/ML IV SOLN
INTRAVENOUS | Status: AC
  Filled 2019-11-04: qty 1

## 2019-11-04 MED ORDER — LIDOCAINE 2% (20 MG/ML) 5 ML SYRINGE
INTRAMUSCULAR | Status: DC | PRN
  Administered 2019-11-04: 80 mg via INTRAVENOUS

## 2019-11-04 MED ORDER — MIDAZOLAM HCL 2 MG/2ML IJ SOLN
INTRAMUSCULAR | Status: AC
  Filled 2019-11-04: qty 2

## 2019-11-04 MED ORDER — PROPOFOL 10 MG/ML IV BOLUS
INTRAVENOUS | Status: DC | PRN
  Administered 2019-11-04: 150 mg via INTRAVENOUS

## 2019-11-04 SURGICAL SUPPLY — 48 items
APPLIER CLIP 5 13 M/L LIGAMAX5 (MISCELLANEOUS)
APPLIER CLIP ROT 10 11.4 M/L (STAPLE)
BARRIER SKIN 2 1/4 (WOUND CARE) ×2 IMPLANT
BLADE EXTENDED COATED 6.5IN (ELECTRODE) IMPLANT
CABLE HIGH FREQUENCY MONO STRZ (ELECTRODE) IMPLANT
CLIP APPLIE 5 13 M/L LIGAMAX5 (MISCELLANEOUS) IMPLANT
CLIP APPLIE ROT 10 11.4 M/L (STAPLE) IMPLANT
COVER MAYO STAND STRL (DRAPES) IMPLANT
COVER WAND RF STERILE (DRAPES) IMPLANT
DECANTER SPIKE VIAL GLASS SM (MISCELLANEOUS) ×2 IMPLANT
DERMABOND ADVANCED (GAUZE/BANDAGES/DRESSINGS) ×1
DERMABOND ADVANCED .7 DNX12 (GAUZE/BANDAGES/DRESSINGS) ×1 IMPLANT
ELECT REM PT RETURN 15FT ADLT (MISCELLANEOUS) ×2 IMPLANT
GAUZE SPONGE 4X4 12PLY STRL (GAUZE/BANDAGES/DRESSINGS) ×2 IMPLANT
GLOVE BIO SURGEON STRL SZ7.5 (GLOVE) ×2 IMPLANT
GLOVE INDICATOR 8.0 STRL GRN (GLOVE) ×4 IMPLANT
GOWN STRL REUS W/TWL XL LVL3 (GOWN DISPOSABLE) ×4 IMPLANT
HANDLE SUCTION POOLE (INSTRUMENTS) IMPLANT
KIT TURNOVER KIT A (KITS) IMPLANT
POUCH OSTOMY 2 PC DRNBL 2.25 (WOUND CARE) ×1 IMPLANT
POUCH OSTOMY DRNBL 2 1/4 (WOUND CARE) ×2
POUCH RETRIEVAL ECOSAC 10 (ENDOMECHANICALS) IMPLANT
POUCH RETRIEVAL ECOSAC 10MM (ENDOMECHANICALS)
POUCH SPECIMEN RETRIEVAL 10MM (ENDOMECHANICALS) IMPLANT
SEALER TISSUE G2 STRG ARTC 35C (ENDOMECHANICALS) IMPLANT
SET IRRIG TUBING LAPAROSCOPIC (IRRIGATION / IRRIGATOR) ×2 IMPLANT
SLEEVE ADV FIXATION 5X100MM (TROCAR) ×2 IMPLANT
SOL ANTI FOG 6CC (MISCELLANEOUS) ×1 IMPLANT
SOLUTION ANTI FOG 6CC (MISCELLANEOUS) ×1
SPONGE LAP 18X18 RF (DISPOSABLE) IMPLANT
SUCTION POOLE HANDLE (INSTRUMENTS)
SUT PDS AB 1 TP1 96 (SUTURE) IMPLANT
SUT SILK 2 0 (SUTURE)
SUT SILK 2 0 SH (SUTURE) ×4 IMPLANT
SUT SILK 2 0 SH CR/8 (SUTURE) IMPLANT
SUT SILK 2-0 18XBRD TIE 12 (SUTURE) IMPLANT
SUT SILK 3 0 (SUTURE)
SUT SILK 3 0 SH CR/8 (SUTURE) IMPLANT
SUT SILK 3-0 18XBRD TIE 12 (SUTURE) IMPLANT
SUT VIC AB 3-0 SH 18 (SUTURE) ×4 IMPLANT
TOWEL OR 17X26 10 PK STRL BLUE (TOWEL DISPOSABLE) ×4 IMPLANT
TOWEL OR NON WOVEN STRL DISP B (DISPOSABLE) ×2 IMPLANT
TRAY FOLEY MTR SLVR 16FR STAT (SET/KITS/TRAYS/PACK) ×2 IMPLANT
TRAY LAPAROSCOPIC (CUSTOM PROCEDURE TRAY) ×2 IMPLANT
TROCAR ADV FIXATION 12X100MM (TROCAR) IMPLANT
TROCAR ADV FIXATION 5X100MM (TROCAR) ×2 IMPLANT
TROCAR BLADELESS OPT 5 100 (ENDOMECHANICALS) ×2 IMPLANT
TROCAR XCEL BLUNT TIP 100MML (ENDOMECHANICALS) IMPLANT

## 2019-11-04 NOTE — Progress Notes (Signed)
Patient ID: Susan Todd, female   DOB: 01-28-1966, 54 y.o.   MRN: [id number]  PROGRESS NOTE    Susan Todd  PHX:[id number] DOB: Jun 03, 1965 DOA: 10/24/2019 PCP: Wenda Low, MD   Brief Narrative:  54 y.o. female with a history of HTN/HLD, anxiety/depression,seizure disorder, COPDwith history of previous diverticular disease who presented on 10/24/19 with worsening abdominal pain. Pain going on since early July initially with vaginal discharge, seen by her gynecologist and had multiple evaluation and was on different antibiotics subsequently referred to GI. CT of the abdomen and pelvis done on 10/23/2019 showed several abscesses within the abdomen coming from vagina/rectal region with concern for potential perforated appendicitis. 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 She was started on broad-spectrum antibiotics. GI and general surgery were consulted.  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 Appendicitis, possible rectal mass not completely ruled out -Currently on Zosyn.    -Status post flexible sigmoidoscopy on 10/30/2019 which showed diverticulosis in the rectosigmoid colon and in the sigmoid colon along with stenosis/narrowing in the rectosigmoid colon and the sigmoid colon in the region of diverticulosis; biopsies were taken. GI signed off on 10/31/2019. Outpatient follow-up with GI for full colonoscopy. - Cultures negative so far. Leukocytosis has resolved. -CT of the abdomen and pelvis repeated on 10/31/2019 still shows centrally located abscess within the pelvis which is increased in size and is not amenable to percutaneous drainage given its location.  -Status post laparoscopic diverting loop ileostomy today. follow further recommendations  Hypertension -Blood pressure stable. Home losartan and metoprolol on  hold  Hypokalemia -no labs today  Macrocytic anemia -Folate, vitamin B12 and ferritin and iron within normal limit. Monitor CBC intermittently.  Hemoglobin stable currently.  Hyperlipidemia -Continue statin  Anxiety/depression/history of seizure disorder -Outpatient follow-up  COPD -Stable.   Tobacco abuse -Patient was counseled regarding cessation by prior hospitalist. Continue nicotine patch   DVT prophylaxis: Lovenox Code Status: Full Family Communication: Patient at bedside Disposition Plan: Status is: Inpatient  Remains inpatient appropriate because:Inpatient level of care appropriate due to severity of illness   Dispo: The patient is from: Home              Anticipated d/c is to: Home              Anticipated d/c date is: > 3 days              Patient currently is not medically stable to d/c.   Consultants: GI/general surgery  Procedures:  Flexible sigmoidoscopy on 10/30/2019 Impression:               - Preparation of the colon was inadequate.                           - Hemorrhoids found on digital rectal exam.                           - Stool in the entire examined colon. Lavaged.                           - Diverticulosis in the recto-sigmoid colon and in  the sigmoid colon. Overt diverticulitis was not                            noted.                           - Stenosis/Narrowing in the recto-sigmoid colon and                            in the sigmoid colon in region of diverticulosis.                           - Normal mucosa in the entire examined colon.                            Biopsied to rule out chronic colitis/chronic                            proctitis.                           - Non-bleeding non-thrombosed external and internal                            hemorrhoids. Moderate Sedation:      Not Applicable - Patient had care per Anesthesia. Recommendation:           - The patient will be observed post-procedure,                             until all discharge criteria are met.                           - Return patient to hospital ward for ongoing care.                           - Resume previous diet.                           - Await pathology results.                           - Based on endoscopic evaluation today, the                            underlying pathology does not look to be IBD                            related. Will await the pathology, but feel                            diverticular related issues seem most likely.                           - Further workup/management as per surgical service.                           -  The findings and recommendations were discussed                            with the patient.                           - The findings and recommendations were discussed                            with the referring provider.  11/04/2019 1. Laparoscopic diverting loop ileostomy 2. Diagnostic laparoscopy  Antimicrobials:  Anti-infectives (From admission, onward)   Start     Dose/Rate Route Frequency Ordered Stop   11/03/19 0845  cefoTEtan (CEFOTAN) 2 g in sodium chloride 0.9 % 100 mL IVPB  Status:  Discontinued        2 g 200 mL/hr over 30 Minutes Intravenous On call to O.R. 11/03/19 0831 11/03/19 0842   10/25/19 1000  vancomycin (VANCOCIN) IVPB 1000 mg/200 mL premix  Status:  Discontinued        1,000 mg 200 mL/hr over 60 Minutes Intravenous Every 12 hours 10/24/19 1835 10/28/19 1727   10/24/19 2300  piperacillin-tazobactam (ZOSYN) IVPB 3.375 g        3.375 g 12.5 mL/hr over 240 Minutes Intravenous Every 8 hours 10/24/19 1835     10/24/19 2000  vancomycin (VANCOREADY) IVPB 1500 mg/300 mL  Status:  Discontinued        1,500 mg 150 mL/hr over 120 Minutes Intravenous  Once 10/24/19 1835 10/24/19 1836   10/24/19 2000  vancomycin (VANCOREADY) IVPB 1500 mg/300 mL        1,500 mg 150 mL/hr over 120 Minutes Intravenous  Once 10/24/19 1836 10/24/19 2229    10/24/19 1730  piperacillin-tazobactam (ZOSYN) IVPB 3.375 g        3.375 g 100 mL/hr over 30 Minutes Intravenous  Once 10/24/19 1721 10/24/19 1810     Lately  Subjective: Patient seen and examined at bedside.  Complains of some abdominal pain, now. no overnight fever or vomiting reported.   Objective: Vitals:   11/03/19 0630 11/03/19 1338 11/03/19 2115 11/04/19 0501  BP: 137/80 134/87 135/88 (!) 144/85  Pulse: 71 72 69 83  Resp: _0 Temp: 98.3 F (36.8 C) 99.2 F (37.3 C) 98.9 F (37.2 C) 98.7 F (37.1 C)  TempSrc: Oral     SpO2: 95% 98% 97% 96%  Weight:      Height:        Intake/Output Summary (Last 24 hours) at 11/04/2019 0729 Last data filed at 11/04/2019 0501 Gross per 24 hour  Intake 1966.53 ml  Output --  Net 1966.53 ml   Filed Weights   10/24/19 0751  Weight: 78.5 kg    Examination:  General exam: No acute distress Respiratory system: Bilateral decreased breath sounds at bases with no wheezing  cardiovascular system: Rate controlled, S1-S2 heard  gastrointestinal system: Abdomen is nondistended, soft and mild lower quadrant tenderness. Ileostomy bag present.  Bowel sounds are heard extremities: No edema or cyanosis  Data Reviewed: I have personally reviewed following labs and imaging studies  CBC: Recent Labs  Lab 10/29/19 0320 10/30/19 0347 10/31/19 0427 11/01/19 0326 11/02/19 0345  WBC 4.9 6.5 6.3 7.9 5.7  NEUTROABS  --   --  3.0 4.2 2.7  HGB 8.5* 9.3* 9.3* 8.9* 7.9*  HCT 28.1* 30.3* 30.0* 29.6*  25.6*  MCV 103.3* 102.7* 102.0* 103.1* 102.4*  PLT 325 326 324 358 671   Basic Metabolic Panel: Recent Labs  Lab 10/29/19 0320 10/29/19 0320 10/30/19 0347 10/30/19 0347 10/31/19 0427 11/01/19 0326 11/02/19 0345 11/03/19 0327 11/04/19 0311  NA 141  --  140  --  139 139 139  --   --   K 3.8  --  3.7  --  3.5 3.3* 3.4*  --   --   CL 107  --  108  --  107 108 105  --   --   CO2 27  --  23  --  _0 --   --   GLUCOSE 96  --  84   --  101* 95 104*  --   --   BUN 5*  --  <5*  --  5* <5* 6  --   --   CREATININE 0.80   < > 0.70   < > 0.81 0.70 0.64 0.62 0.58  CALCIUM 9.1  --  8.9  --  9.1 8.9 8.5*  --   --   MG  --   --   --   --  1.8 1.7 1.7  --   --    < > = values in this interval not displayed.   GFR: Estimated Creatinine Clearance: 86.4 mL/min (by C-G formula based on SCr of 0.58 mg/dL). Liver Function Tests: Recent Labs  Lab 10/31/19 0427  AST 12*  ALT 9  ALKPHOS 94  BILITOT 0.6  PROT 5.9*  ALBUMIN 2.8*   No results for input(s): LIPASE, AMYLASE in the last 168 hours. No results for input(s): AMMONIA in the last 168 hours. Coagulation Profile: No results for input(s): INR, PROTIME in the last 168 hours. Cardiac Enzymes: No results for input(s): CKTOTAL, CKMB, CKMBINDEX, TROPONINI in the last 168 hours. BNP (last 3 results) No results for input(s): PROBNP in the last 8760 hours. HbA1C: No results for input(s): HGBA1C in the last 72 hours. CBG: No results for input(s): GLUCAP in the last 168 hours. Lipid Profile: No results for input(s): CHOL, HDL, LDLCALC, TRIG, CHOLHDL, LDLDIRECT in the last 72 hours. Thyroid Function Tests: No results for input(s): TSH, T4TOTAL, FREET4, T3FREE, THYROIDAB in the last 72 hours. Anemia Panel: No results for input(s): VITAMINB12, FOLATE, FERRITIN, TIBC, IRON, RETICCTPCT in the last 72 hours. Sepsis Labs: No results for input(s): PROCALCITON, LATICACIDVEN in the last 168 hours.  Recent Results (from the past 240 hour(s))  Surgical PCR screen     Status: None   Collection Time: 11/03/19 11:23 PM   Specimen: Nasal Mucosa; Nasal Swab  Result Value Ref Range Status   MRSA, PCR NEGATIVE NEGATIVE Final   Staphylococcus aureus NEGATIVE NEGATIVE Final    Comment: (NOTE) The Xpert SA Assay (FDA approved for NASAL specimens in patients 37 years of age and older), is one component of a comprehensive surveillance program. It is not intended to diagnose infection nor  to guide or monitor treatment. Performed at [facility], [place] [address] [place], [place] [address]          Radiology Studies: No results found.      Scheduled Meds: . aspirin EC  81 mg Oral Daily  . atorvastatin  10 mg Oral QPM  . calcium-vitamin D  1 tablet Oral QHS  . enoxaparin (LOVENOX) injection  40 mg Subcutaneous Q24H  . feeding supplement  1 Container Oral TID BM  . loratadine  10 mg Oral Daily  . multivitamin with minerals  1 tablet Oral QHS  . nicotine  7 mg Transdermal Daily  . polyethylene glycol  17 g Oral Daily  . sodium chloride flush  10-40 mL Intracatheter Q12H   Continuous Infusions: . lactated ringers    . lactated ringers 75 mL/hr at 11/03/19 1856  . piperacillin-tazobactam (ZOSYN)  IV 3.375 g (11/04/19 0630)          Aline August, MD [facility] 11/04/2019, 7:29 AM

## 2019-11-04 NOTE — Plan of Care (Signed)
  Problem: Health Behavior/Discharge Planning: Goal: Ability to manage health-related needs will improve Outcome: Progressing   Problem: Clinical Measurements: Goal: Ability to maintain clinical measurements within normal limits will improve Outcome: Progressing Goal: Will remain free from infection Outcome: Progressing Goal: Diagnostic test results will improve Outcome: Progressing Goal: Respiratory complications will improve Outcome: Progressing Goal: Cardiovascular complication will be avoided Outcome: Progressing   Problem: Nutrition: Goal: Adequate nutrition will be maintained Outcome: Progressing   Problem: Elimination: Goal: Will not experience complications related to bowel motility Outcome: Progressing   Problem: Pain Managment: Goal: General experience of comfort will improve Outcome: Progressing   Problem: Safety: Goal: Ability to remain free from injury will improve Outcome: Progressing

## 2019-11-04 NOTE — Anesthesia Postprocedure Evaluation (Signed)
Anesthesia Post Note  Patient: Susan Todd  Procedure(s) Performed: DIAGNOSTIC LAPAROTOMY WITH LAPAROSCOPIC diverting loop ileostomy (N/A )     Patient location during evaluation: PACU Anesthesia Type: General Level of consciousness: sedated Pain management: pain level controlled Vital Signs Assessment: post-procedure vital signs reviewed and stable Respiratory status: spontaneous breathing and respiratory function stable Cardiovascular status: stable Postop Assessment: no apparent nausea or vomiting Anesthetic complications: no   No complications documented.  Last Vitals:  Vitals:   11/04/19 1725 11/04/19 1842  BP: (!) 158/99 (!) 146/96  Pulse: 94 82  Resp: 17 16  Temp: 36.7 C 36.7 C  SpO2: 99% 94%    Last Pain:  Vitals:   11/04/19 1917  TempSrc:   PainSc: 10-Worst pain ever                 Mellody Dance

## 2019-11-04 NOTE — Anesthesia Preprocedure Evaluation (Addendum)
Anesthesia Evaluation  Patient identified by MRN, date of birth, ID band Patient awake    Reviewed: Allergy & Precautions, NPO status , Patient's Chart, lab work & pertinent test results  Airway Mallampati: II  TM Distance: >3 FB Neck ROM: Full    Dental  (+) Teeth Intact   Pulmonary COPD, Current Smoker and Patient abstained from smoking.,    Pulmonary exam normal breath sounds clear to auscultation       Cardiovascular hypertension, Pt. on medications and Pt. 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   Endo/Other  negative endocrine ROS  Renal/GU negative Renal ROS     Musculoskeletal  (+) Arthritis ,   Abdominal   Peds  Hematology  (+) Blood dyscrasia, anemia ,   Anesthesia Other Findings   Reproductive/Obstetrics                            Anesthesia Physical  Anesthesia Plan  ASA: III  Anesthesia Plan: General   Post-op Pain Management:    Induction: Intravenous  PONV Risk Score and Plan: 1 and Propofol infusion and Treatment may vary due to age or medical condition  Airway Management Planned: Oral ETT  Additional Equipment:   Intra-op Plan:   Post-operative Plan: Extubation in OR  Informed Consent: I have reviewed the patients History and Physical, chart, labs and discussed the procedure including the risks, benefits and alternatives for the proposed anesthesia with the patient or authorized representative who has indicated his/her understanding and acceptance.     Dental advisory given  Plan Discussed with: CRNA and Anesthesiologist  Anesthesia Plan Comments: (CBC/Type and  screen ordered this AM and floor RN, Irving Burton informed by me personally. Labs not drawn. Preop nurse will draw the labs. Patient will need additional PIV if possible. Tanna Furry, MD  )       Anesthesia Quick Evaluation

## 2019-11-04 NOTE — Progress Notes (Signed)
Day of Surgery   Subjective/Chief Complaint: Stable lower abdominal discomfort. Tolerated bowel prep. States she is ready for surgery.  Objective: Vital signs in last 24 hours: Temp:  [98.7 F (37.1 C)-99.2 F (37.3 C)] 98.7 F (37.1 C) (09/13 0501) Pulse Rate:  [69-83] 83 (09/13 0501) Resp:  [17-20] 17 (09/13 0501) BP: (134-144)/(85-88) 144/85 (09/13 0501) SpO2:  [96 %-98 %] 96 % (09/13 0501) Last BM Date: 11/03/19  Intake/Output from previous day: 09/12 0701 - 09/13 0700 In: 1966.5 [P.O.:410; I.V.:1397.6; IV Piggyback:158.9] Out: -  Intake/Output this shift: Total I/O In: 493.8 [P.O.:40; I.V.:447.7; IV Piggyback:6.2] Out: -   PE:  Constitutional: No acute distress, conversant, appears states age. Eyes: Anicteric sclerae, moist conjunctiva, no lid lag Lungs: normal respiratory effort CV: rrr, no peripheral edema GI: Soft, no masses or hepatosplenomegaly, mildly ttp in lower midline abdomen; not significantly distended Skin: No rashes, palpation reveals normal turgor Psychiatric: appropriate judgment and insight, oriented to person, place, and time   Lab Results:  Recent Labs    11/02/19 0345  WBC 5.7  HGB 7.9*  HCT 25.6*  PLT 283   BMET Recent Labs    11/02/19 0345 11/02/19 0345 11/03/19 0327 11/04/19 0311  NA 139  --   --   --   K 3.4*  --   --   --   CL 105  --   --   --   CO2 25  --   --   --   GLUCOSE 104*  --   --   --   BUN 6  --   --   --   CREATININE 0.64   < > 0.62 0.58  CALCIUM 8.5*  --   --   --    < > = values in this interval not displayed.   PT/INR No results for input(s): LABPROT, INR in the last 72 hours. ABG No results for input(s): PHART, HCO3 in the last 72 hours.  Invalid input(s): PCO2, PO2  Studies/Results: No results found.  Anti-infectives: Anti-infectives (From admission, onward)   Start     Dose/Rate Route Frequency Ordered Stop   11/03/19 0845  cefoTEtan (CEFOTAN) 2 g in sodium chloride 0.9 % 100 mL IVPB  Status:   Discontinued        2 g 200 mL/hr over 30 Minutes Intravenous On call to O.R. 11/03/19 0831 11/03/19 0842   10/25/19 1000  vancomycin (VANCOCIN) IVPB 1000 mg/200 mL premix  Status:  Discontinued        1,000 mg 200 mL/hr over 60 Minutes Intravenous Every 12 hours 10/24/19 1835 10/28/19 1727   10/24/19 2300  piperacillin-tazobactam (ZOSYN) IVPB 3.375 g        3.375 g 12.5 mL/hr over 240 Minutes Intravenous Every 8 hours 10/24/19 1835     10/24/19 2000  vancomycin (VANCOREADY) IVPB 1500 mg/300 mL  Status:  Discontinued        1,500 mg 150 mL/hr over 120 Minutes Intravenous  Once 10/24/19 1835 10/24/19 1836   10/24/19 2000  vancomycin (VANCOREADY) IVPB 1500 mg/300 mL        1,500 mg 150 mL/hr over 120 Minutes Intravenous  Once 10/24/19 1836 10/24/19 2229   10/24/19 1730  piperacillin-tazobactam (ZOSYN) IVPB 3.375 g        3.375 g 100 mL/hr over 30 Minutes Intravenous  Once 10/24/19 1721 10/24/19 1810      Assessment/Plan: Hypertension Hyperlipidemia Hx seizures Hx COPD/tobacco use Hx anxiety depression   Suspected diverticulitis  with colovaginal fistula - CT 9/1 w/ fistulous network, appendiceal and colonic inflammation, colovaginal fistula. 3.5 cm fluid collection posterior to the sigmoid colon, 3.2 cm fluid collection in the RLQ. Neither were amenable to IR drainage per chart review. - s/p flex sig 9/8 which showed recto-sigmoid narrowing without a mass. Bx negative for malignancy. - CT repeated 9/9 due to lack of clinical improvement, showed interval increase in pelvic fluid collection, not amenable to IR drainage.  - At this point I do not think the inflammatory process in the patients pelvis is going to improve without fecal diversion.   -The anatomy and physiology of the GI tract was discussed at length with the patient. The pathophysiology of diverticulitis was discussed at length as well -Given that she has a rather low process likely due to low peritoneal reflection and  colo-vaginal fistula, we reviewed options going forward. "Definitive" surgery at present would almost certainly involve a low anterior resection with Hartmann's type procedure vs LAR and ileostomy. There is large pelvic abscess(es) as well. One potential option which may likely improve her symptoms while reducing overall morbidity is upfront diverting loop ileostomy with attempt at drainage of pelvic abscess(es) if amenable and then in the weeks/months that follow a definitve procedure under more favorable circumstances - less surrounding inflammation and better nutrition. She is in favor of this plan. -Will plan for diagnostic laparoscopy, probable diverting loop ileostomy, possible drainage of intra-abdominal abscess(es) -She was seen and marked by our WOCN team today -The planned procedures, material risks (including, but not limited to, pain, bleeding, infection, scarring, need for blood transfusion, damage to surrounding structures- blood vessels/nerves/viscus/organs, damage to ureter, urine leak, leak from anastomosis, need for additional procedures, worsening of pre-existing medical conditions, hernia, recurrence, pneumonia, heart attack, stroke, death) benefits and alternatives to surgery were discussed at length. The patient's questions were answered to her satisfaction, she voiced understanding and elected to proceed with surgery. Additionally, we discussed typical postoperative expectations and the recovery process.   FEN: FLD, add boost breeze. ID: Zosyn 9/2 >>day 9 DVT: Lovenox Follow-up:Dr. Romie Levee   LOS: 11 days    Stephanie Coup [facility] 11/04/2019

## 2019-11-04 NOTE — Consult Note (Signed)
WOC Nurse requested for preoperative stoma site marking  Discussed surgical procedure and stoma creation with patient and family.  Explained role of the WOC nurse team.  Provided the patient with educational booklet and provided samples of pouching options.    Examined patient lying, sitting, and standing in order to place the marking in the patient's visual field, away from any creases or abdominal contour issues and within the rectus muscle.  Attempted to mark below the patient's belt line.   Marked for colostomy in the LLQ  3___ cm to the left of the umbilicus and __0__cm above/below the umbilicus.  Marked for ileostomy in the RLQ  _3___cm to the right of the umbilicus and  _0___ cm above/below the umbilicus.   Patient's abdomen cleansed with CHG wipes at site markings, allowed to air dry prior to marking.Covered mark with thin film transparent dressing to preserve mark until date of surgery.   WOC Nurse team will follow up with patient after surgery for continue ostomy care and teaching.  Susan Todd [facility] [place] [facility] MSN, RN,CWOCN, CNS, [employer] [phone]

## 2019-11-04 NOTE — Anesthesia Procedure Notes (Signed)
Procedure Name: Intubation Date/Time: 11/04/2019 10:07 AM Performed by: Montel Clock, CRNA Pre-anesthesia Checklist: Patient identified, Emergency Drugs available, Suction available, Patient being monitored and Timeout performed Patient Re-evaluated:Patient Re-evaluated prior to induction Oxygen Delivery Method: Circle system utilized Preoxygenation: Pre-oxygenation with 100% oxygen Induction Type: IV induction Ventilation: Mask ventilation without difficulty and Oral airway inserted - appropriate to patient size Laryngoscope Size: Mac and 3 Grade View: Grade I Tube type: Oral Tube size: 7.0 mm Number of attempts: 1 Airway Equipment and Method: Stylet Placement Confirmation: ETT inserted through vocal cords under direct vision,  positive ETCO2 and breath sounds checked- equal and bilateral Secured at: 21 cm Tube secured with: Tape Dental Injury: Teeth and Oropharynx as per pre-operative assessment

## 2019-11-04 NOTE — Op Note (Signed)
11/04/2019  11:35 AM  PATIENT:  Susan Todd  54 y.o. female  Patient Care Team: Georgann Housekeeper, MD as PCP - General (Internal Medicine)  PRE-OPERATIVE DIAGNOSIS:  Possible complicated diverticulitis; colovaginal fistula  POST-OPERATIVE DIAGNOSIS:  Same; possible pelvic malignancy  PROCEDURE:  1. Laparoscopic diverting loop ileostomy 2. Diagnostic laparoscopy  SURGEON:  Stephanie Coup. Cliffton Asters, MD  ASSISTANT: Zola Button, PA-C  ANESTHESIA:   local and general  COUNTS:  Sponge, needle and instrument counts were reported correct x2 at the conclusion of the operation.  EBL: 10 mL  DRAINS: None  SPECIMEN: None  COMPLICATIONS: None  FINDINGS: Rather soft sigmoid entering the pelvis without clear diverticulitis involving at least this segment. Unable to visualize pelvic structures due to significant process involving pelvic organs. Left fallopian tube was moderately dilated. I was not able to clearly visualize either ovary. Peritoneum from right lower abdomen/pelvis tethered to sigmoid and what is likely uterus and being pulled into pelvis. This was densely adherent. Could have underlying malignancy. I was not able to visualize an abscess cavity as this process pulling the peritoneum was covering everything in the pelvis. Given imaging findings and concerns preoperatively as discussed with Dr. Maisie Fus, we went ahead and proceeded with fecal diversion using a loop ileostomy. Sigmoid was too involved to use as conduit for diversion proximally.  DISPOSITION: PACU in satisfactory condition  INDICATION: Susan Todd is a very pleasant 54yoF with with history of colovaginal fistula and suspected diverticulitis as a cause with a large pelvic abscess.  She been managed as an outpatient but was having ongoing issues with pain in her lower abdomen.  She had presented to the hospital for admission as per Dr. Maisie Fus.  She was found to be hypoalbuminemic with an albumin of 2.7 - 2.8.  CT demonstrated a  large pelvic abscess cavity with pelvic inflammatory changes.  The abscess is favored to be in the central pelvis and enlarging.  This was not amenable to percutaneous drainage.  We discussed everything at length with her.  We discussed fecal diversion and potential attempts at pelvic abscess drainage.  Given the degree of inflammation in her pelvis as well as the distance down in the deep pelvis would involve large open low anterior resection at the minimum. We had therefore discussed diversion as the upfront initial management to at least divert the stool stream away from all of this, antibiotics, nutrition.  Please refer to notes elsewhere for details regarding these discussions.  DESCRIPTION: The patient was identified in preop holding and taken to the OR where she was placed on the operating room table. SCDs were placed. General endotracheal anesthesia was induced without difficulty. Pressure points were padded. Hair on the abdomen was clipped. A foley was placed under sterile conditions. She was then prepped and draped in the usual sterile fashion. A surgical timeout was performed indicating the correct patient, procedure, positioning and need for preoperative antibiotics.   An OG tube was placed to suction previously by anesthesia. At Palmer's point, a stab incision was created and the Veress needle introduced into the peritoneal cavity on the first attempt. Intraperitoneal location was confirmed with the aspiration and saline drop test.  Pneumoperitoneum was established to a maximum pressure 15 mmHg with CO2.  At this location, a 5 mm optical viewing trocar was placed under direct visualization.  Laparoscope was inserted and demonstrated no evidence of Veress needle or trocar site complication.  Bilateral transversus abdominis plane blocks were then created using a dilute mixture  of Exparel with 0.25% Marcaine.  Under direct visualization, 2 additional 5 mm trochars were placed in the left lower abdomen.   The abdomen was then surveyed.  She was placed in Trendelenburg.  There was a large inflammatory type process in the pelvis that was also involving the right lower abdomen/pelvic peritoneum and pulling into the pelvis.  This precluded any sort of deep pelvic visualization including of the uterus or right ovary.  The left fallopian tube was all that I could see.  A picture was taken into the health record using the laparoscope.  The left fallopian tube was moderately dilated.  There is no gross peritoneal involvement on the surface of the abdominal peritoneum or liver.  The sigmoid colon was relatively soft and did not appear acutely disease but the only portion is able to visualize was the port entering the pelvis.  In the event that there was an underlying malignancy, I opted not to pursue attempts at blunt dissection into the pelvis.  Therefore, we turned our attention to fecal diversion.  The sigmoid colon was not a good conduit as it was tethered with this process.  We proceeded with a loop ileostomy.  The cecum was identified and traced back to the terminal ileum.  Going approximately 20 cm proximal to all this, the site was selected such that down the road should she undergo reversal, this should be doable without needing an ileocecectomy.  The ileum at this location was identified.  Her ostomy marking site was identified just to the right of the umbilicus.  The skin at this location was excised circumferentially creating a circle.  Subcutaneous tissue was dissected and the rectus fascia incised anteriorly.  The rectus muscle was then spread.  The posterior sheath was then incised.  This is approximately 2 fingerbreadths in diameter.  Maintaining orientation, the ileum was brought out through this incision.  The laparoscope was used to reinspect everything and confirmed there is no twisting.  At this point the laparoscopic portion of procedure was terminated.  The 2 lower abdominal trochars were removed under  direct visualization.  The skin of all incision sites was closed with 4-0 Monocryl subcuticular suture and Dermabond.  The ostomy was then matured in a Brooke manner with 3-0 Vicryl suture.  This is a loop ileostomy.  The proximal end is cephalad.  An ostomy appliance was then cut to fit.  I was able to do a somewhat limited rectal exam as well as vaginal exam.  There is some thickening of the tissue on the posterior wall of the deep vagina.  Not feel any definitive masses in the distal rectum.  We will likely plan pelvic MRI for further evaluation postoperatively.  She was then awakened from anesthesia, extubated, and transferred to a stretcher for transport to PACU in satisfactory condition

## 2019-11-04 NOTE — Transfer of Care (Signed)
Immediate Anesthesia Transfer of Care Note  Patient: Susan Todd  Procedure(s) Performed: DIAGNOSTIC LAPAROTOMY WITH LAPAROSCOPIC diverting loop ileostomy (N/A )  Patient Location: PACU  Anesthesia Type:General  Level of Consciousness: drowsy and patient cooperative  Airway & Oxygen Therapy: Patient Spontanous Breathing and Patient connected to face mask oxygen  Post-op Assessment: Report given to RN and Post -op Vital signs reviewed and stable  Post vital signs: Reviewed and stable  Last Vitals:  Vitals Value Taken Time  BP 157/88 11/04/19 1140  Temp 36.8 C 11/04/19 1140  Pulse 102 11/04/19 1145  Resp 15 11/04/19 1145  SpO2 100 % 11/04/19 1145  Vitals shown include unvalidated device data.  Last Pain:  Vitals:   11/04/19 0841  TempSrc: Oral  PainSc:       Patients Stated Pain Goal: 4 (11/04/19 0807)  Complications: No complications documented.

## 2019-11-05 ENCOUNTER — Encounter (HOSPITAL_COMMUNITY): Payer: Self-pay | Admitting: Surgery

## 2019-11-05 ENCOUNTER — Inpatient Hospital Stay (HOSPITAL_COMMUNITY): Payer: Self-pay

## 2019-11-05 LAB — CBC WITH DIFFERENTIAL/PLATELET
Abs Immature Granulocytes: 0.03 10*3/uL (ref 0.00–0.07)
Basophils Absolute: 0 10*3/uL (ref 0.0–0.1)
Basophils Relative: 0 %
Eosinophils Absolute: 0 10*3/uL (ref 0.0–0.5)
Eosinophils Relative: 0 %
HCT: 26.9 % — ABNORMAL LOW (ref 36.0–46.0)
Hemoglobin: 8.2 g/dL — ABNORMAL LOW (ref 12.0–15.0)
Immature Granulocytes: 1 %
Lymphocytes Relative: 25 %
Lymphs Abs: 1.4 10*3/uL (ref 0.7–4.0)
MCH: 30.9 pg (ref 26.0–34.0)
MCHC: 30.5 g/dL (ref 30.0–36.0)
MCV: 101.5 fL — ABNORMAL HIGH (ref 80.0–100.0)
Monocytes Absolute: 0.3 10*3/uL (ref 0.1–1.0)
Monocytes Relative: 6 %
Neutro Abs: 3.8 10*3/uL (ref 1.7–7.7)
Neutrophils Relative %: 68 %
Platelets: 338 10*3/uL (ref 150–400)
RBC: 2.65 MIL/uL — ABNORMAL LOW (ref 3.87–5.11)
RDW: 14.3 % (ref 11.5–15.5)
WBC: 5.6 10*3/uL (ref 4.0–10.5)
nRBC: 0 % (ref 0.0–0.2)

## 2019-11-05 LAB — BASIC METABOLIC PANEL
Anion gap: 10 (ref 5–15)
BUN: 8 mg/dL (ref 6–20)
CO2: 24 mmol/L (ref 22–32)
Calcium: 8.7 mg/dL — ABNORMAL LOW (ref 8.9–10.3)
Chloride: 102 mmol/L (ref 98–111)
Creatinine, Ser: 0.56 mg/dL (ref 0.44–1.00)
GFR calc Af Amer: >60 mL/min (ref >60)
GFR calc non Af Amer: >60 mL/min (ref >60)
Glucose, Bld: 135 mg/dL — ABNORMAL HIGH (ref 70–99)
Potassium: 4 mmol/L (ref 3.5–5.1)
Sodium: 136 mmol/L (ref 135–145)

## 2019-11-05 LAB — MAGNESIUM: Magnesium: 1.7 mg/dL (ref 1.7–2.4)

## 2019-11-05 MED ORDER — GADOBUTROL 1 MMOL/ML IV SOLN
7.0000 mL | Freq: Once | INTRAVENOUS | Status: AC | PRN
  Administered 2019-11-05: 7 mL via INTRAVENOUS

## 2019-11-05 MED ORDER — ACETAMINOPHEN 500 MG PO TABS
1000.0000 mg | ORAL_TABLET | Freq: Four times a day (QID) | ORAL | Status: DC
  Administered 2019-11-05 – 2019-11-13 (×28): 1000 mg via ORAL
  Filled 2019-11-05 (×30): qty 2

## 2019-11-05 MED ORDER — OXYCODONE HCL 5 MG PO TABS
5.0000 mg | ORAL_TABLET | ORAL | Status: DC | PRN
  Administered 2019-11-05 – 2019-11-06 (×6): 5 mg via ORAL
  Filled 2019-11-05 (×7): qty 1

## 2019-11-05 MED ORDER — METHOCARBAMOL 500 MG PO TABS
1000.0000 mg | ORAL_TABLET | Freq: Three times a day (TID) | ORAL | Status: DC
  Administered 2019-11-05 – 2019-11-13 (×25): 1000 mg via ORAL
  Filled 2019-11-05 (×25): qty 2

## 2019-11-05 MED ORDER — LIDOCAINE 5 % EX PTCH
1.0000 | MEDICATED_PATCH | CUTANEOUS | Status: DC
  Administered 2019-11-05: 1 via TRANSDERMAL
  Filled 2019-11-05 (×2): qty 1

## 2019-11-05 NOTE — TOC Progression Note (Signed)
Transition of Care [facility]) - Progression Note    Patient Details  Name: Susan Todd MRN: [id number] Date of Birth: [birth year]  [facility] CM/SW Contact  Clearance Coots, LCSW Phone Number: 11/05/2019, 2:04 PM  Clinical Narrative:    CSW explained home health for initial teaching services only. Patient agreeable. HHRN arranged through [facility].The agency will follow the patient.    Expected Discharge Plan: Home w Home Health Services Barriers to Discharge: Continued Medical Work up  Expected Discharge Plan and Services Expected Discharge Plan: Home w Home Health Services In-house Referral: Clinical Social Work Discharge Planning Services: Indigent Health Clinic, Medication Assistance   Living arrangements for the past 2 months: Apartment                 DME Arranged: N/A DME Agency: NA       HH Arranged: RN HH Agency: [facility] Date HH Agency Contacted: 11/05/19 Time HH Agency Contacted: [phone] Representative spoke with at [facility] Agency: Amy Hyatt   Social Determinants of Health (SDOH) Interventions    Readmission Risk Interventions No flowsheet data found.

## 2019-11-05 NOTE — Plan of Care (Signed)
Plan of care reviewed and discussed with the patient. 

## 2019-11-05 NOTE — Progress Notes (Signed)
Patient ID: Susan Todd, female   DOB: 1965/06/16, 54 y.o.   MRN: [id number]  PROGRESS NOTE    TALULLAH ABATE  OMB:[id number] DOB: 1965/08/14 DOA: 10/24/2019 PCP: Wenda Low, MD   Brief Narrative:  54 y.o. female with a history of HTN/HLD, anxiety/depression,seizure disorder, COPDwith history of previous diverticular disease who presented on 10/24/19 with worsening abdominal pain. Pain going on since early July initially with vaginal discharge, seen by her gynecologist and had multiple evaluation and was on different antibiotics subsequently referred to GI. CT of the abdomen and pelvis done on 10/23/2019 showed several abscesses within the abdomen coming from vagina/rectal region with concern for potential perforated appendicitis. 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 She was started on broad-spectrum antibiotics. GI and general surgery were consulted.  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 Appendicitis, possible rectal mass not completely ruled out -Currently still on Zosyn.    -Status post flexible sigmoidoscopy on 10/30/2019 which showed diverticulosis in the rectosigmoid colon and in the sigmoid colon along with stenosis/narrowing in the rectosigmoid colon and the sigmoid colon in the region of diverticulosis; biopsies were taken. GI signed off on 10/31/2019. Outpatient follow-up with GI for full colonoscopy. - Cultures negative so far. Leukocytosis has resolved. -CT of the abdomen and pelvis repeated on 10/31/2019 still showed centrally located abscess within the pelvis which is increased in size and is not amenable to percutaneous drainage given its location.  -Status post laparoscopic diverting loop ileostomy on 11/04/2019. follow further recommendations from general surgery.  MRI pelvis pending as per general  surgery recommendations.  Wound care and diet advancement as per general surgery recommendations.  Hypertension -Blood pressure on the higher side. Home losartan and metoprolol on hold.  Might have to resume.  Hypokalemia -Resolved  Macrocytic anemia -Folate, vitamin B12 and ferritin and iron within normal limit. Monitor CBC intermittently.  Hemoglobin stable currently.  Hyperlipidemia -Continue statin  Anxiety/depression/history of seizure disorder -Outpatient follow-up  COPD -Stable.   Tobacco abuse -Patient was counseled regarding cessation by prior hospitalist. Continue nicotine patch   DVT prophylaxis: Lovenox Code Status: Full Family Communication: Patient at bedside Disposition Plan: Status is: Inpatient  Remains inpatient appropriate because:Inpatient level of care appropriate due to severity of illness   Dispo: The patient is from: Home              Anticipated d/c is to: Home              Anticipated d/c date is: > 3 days              Patient currently is not medically stable to d/c.   Consultants: GI/general surgery  Procedures:  Flexible sigmoidoscopy on 10/30/2019 Impression:               - Preparation of the colon was inadequate.                           - Hemorrhoids found on digital rectal exam.                           - Stool in the entire examined colon. Lavaged.                           -  Diverticulosis in the recto-sigmoid colon and in                            the sigmoid colon. Overt diverticulitis was not                            noted.                           - Stenosis/Narrowing in the recto-sigmoid colon and                            in the sigmoid colon in region of diverticulosis.                           - Normal mucosa in the entire examined colon.                            Biopsied to rule out chronic colitis/chronic                            proctitis.                           - Non-bleeding non-thrombosed external and  internal                            hemorrhoids. Moderate Sedation:      Not Applicable - Patient had care per Anesthesia. Recommendation:           - The patient will be observed post-procedure,                            until all discharge criteria are met.                           - Return patient to hospital ward for ongoing care.                           - Resume previous diet.                           - Await pathology results.                           - Based on endoscopic evaluation today, the                            underlying pathology does not look to be IBD                            related. Will await the pathology, but feel                            diverticular related issues seem most likely.                           -  Further workup/management as per surgical service.                           - The findings and recommendations were discussed                            with the patient.                           - The findings and recommendations were discussed                            with the referring provider.  11/04/2019 1. Laparoscopic diverting loop ileostomy 2. Diagnostic laparoscopy  Antimicrobials:  Anti-infectives (From admission, onward)   Start     Dose/Rate Route Frequency Ordered Stop   11/03/19 0845  cefoTEtan (CEFOTAN) 2 g in sodium chloride 0.9 % 100 mL IVPB  Status:  Discontinued        2 g 200 mL/hr over 30 Minutes Intravenous On call to O.R. 11/03/19 0831 11/03/19 0842   10/25/19 1000  vancomycin (VANCOCIN) IVPB 1000 mg/200 mL premix  Status:  Discontinued        1,000 mg 200 mL/hr over 60 Minutes Intravenous Every 12 hours 10/24/19 1835 10/28/19 1727   10/24/19 2300  piperacillin-tazobactam (ZOSYN) IVPB 3.375 g        3.375 g 12.5 mL/hr over 240 Minutes Intravenous Every 8 hours 10/24/19 1835     10/24/19 2000  vancomycin (VANCOREADY) IVPB 1500 mg/300 mL  Status:  Discontinued        1,500 mg 150 mL/hr over 120 Minutes  Intravenous  Once 10/24/19 1835 10/24/19 1836   10/24/19 2000  vancomycin (VANCOREADY) IVPB 1500 mg/300 mL        1,500 mg 150 mL/hr over 120 Minutes Intravenous  Once 10/24/19 1836 10/24/19 2229   10/24/19 1730  piperacillin-tazobactam (ZOSYN) IVPB 3.375 g        3.375 g 100 mL/hr over 30 Minutes Intravenous  Once 10/24/19 1721 10/24/19 1810     Lately  Subjective: Patient seen and examined at bedside.  No overnight fever, vomiting or shortness of breath reported.  Complains of intermittent abdominal pain  Objective: Vitals:   11/04/19 1842 11/04/19 2045 11/05/19 0127 11/05/19 0623  BP: (!) 146/96 (!) 160/87 (!) 164/88 (!) 174/99  Pulse: 82 78 65 68  Resp: '16 18 18 19  ' Temp: 98 F (36.7 C) 98.4 F (36.9 C) 98.4 F (36.9 C) 98.1 F (36.7 C)  TempSrc: Oral Oral Oral Oral  SpO2: 94% 94% 96% 98%  Weight:      Height:        Intake/Output Summary (Last 24 hours) at 11/05/2019 0731 Last data filed at 11/05/2019 0617 Gross per 24 hour  Intake 4532.08 ml  Output 2220 ml  Net 2312.08 ml   Filed Weights   10/24/19 0751  Weight: 78.5 kg    Examination:  General exam: No distress.  Looks anxious Respiratory system: Bilateral decreased breath sounds at bases with some scattered crackles cardiovascular system: S1-S2 heard, rate controlled gastrointestinal system: Abdomen is nondistended, soft and tender in the lower quadrant.  Ileostomy bag present.  Bowel sounds are heard extremities: No cyanosis or clubbing  Data Reviewed: I have personally reviewed following labs and imaging studies  CBC: Recent Labs  Lab 10/31/19 0427 11/01/19 0326 11/02/19 0345 11/04/19 0855 11/05/19 0335  WBC 6.3 7.9 5.7 5.4 5.6  NEUTROABS 3.0 4.2 2.7  --  3.8  HGB 9.3* 8.9* 7.9* 8.8* 8.2*  HCT 30.0* 29.6* 25.6* 28.5* 26.9*  MCV 102.0* 103.1* 102.4* 100.7* 101.5*  PLT 324 358 283 322 443   Basic Metabolic Panel: Recent Labs  Lab 10/30/19 0347 10/30/19 0347 10/31/19 0427 10/31/19 0427  11/01/19 0326 11/02/19 0345 11/03/19 0327 11/04/19 0311 11/05/19 0335  NA 140  --  139  --  139 139  --   --  136  K 3.7  --  3.5  --  3.3* 3.4*  --   --  4.0  CL 108  --  107  --  108 105  --   --  102  CO2 23  --  27  --  22 25  --   --  24  GLUCOSE 84  --  101*  --  95 104*  --   --  135*  BUN <5*  --  5*  --  <5* 6  --   --  8  CREATININE 0.70   < > 0.81   < > 0.70 0.64 0.62 0.58 0.56  CALCIUM 8.9  --  9.1  --  8.9 8.5*  --   --  8.7*  MG  --   --  1.8  --  1.7 1.7  --   --  1.7   < > = values in this interval not displayed.   GFR: Estimated Creatinine Clearance: 86.4 mL/min (by C-G formula based on SCr of 0.56 mg/dL). Liver Function Tests: Recent Labs  Lab 10/31/19 0427  AST 12*  ALT 9  ALKPHOS 94  BILITOT 0.6  PROT 5.9*  ALBUMIN 2.8*   No results for input(s): LIPASE, AMYLASE in the last 168 hours. No results for input(s): AMMONIA in the last 168 hours. Coagulation Profile: No results for input(s): INR, PROTIME in the last 168 hours. Cardiac Enzymes: No results for input(s): CKTOTAL, CKMB, CKMBINDEX, TROPONINI in the last 168 hours. BNP (last 3 results) No results for input(s): PROBNP in the last 8760 hours. HbA1C: No results for input(s): HGBA1C in the last 72 hours. CBG: No results for input(s): GLUCAP in the last 168 hours. Lipid Profile: No results for input(s): CHOL, HDL, LDLCALC, TRIG, CHOLHDL, LDLDIRECT in the last 72 hours. Thyroid Function Tests: No results for input(s): TSH, T4TOTAL, FREET4, T3FREE, THYROIDAB in the last 72 hours. Anemia Panel: No results for input(s): VITAMINB12, FOLATE, FERRITIN, TIBC, IRON, RETICCTPCT in the last 72 hours. Sepsis Labs: No results for input(s): PROCALCITON, LATICACIDVEN in the last 168 hours.  Recent Results (from the past 240 hour(s))  Surgical PCR screen     Status: None   Collection Time: 11/03/19 11:23 PM   Specimen: Nasal Mucosa; Nasal Swab  Result Value Ref Range Status   MRSA, PCR NEGATIVE NEGATIVE  Final   Staphylococcus aureus NEGATIVE NEGATIVE Final    Comment: (NOTE) The Xpert SA Assay (FDA approved for NASAL specimens in patients 83 years of age and older), is one component of a comprehensive surveillance program. It is not intended to diagnose infection nor to guide or monitor treatment. Performed at [facility], [place] [address], [place], [place] [address]          Radiology Studies: No results found.      Scheduled Meds: . acetaminophen  650 mg Oral Q6H  . aspirin  EC  81 mg Oral Daily  . atorvastatin  10 mg Oral QPM  . calcium-vitamin D  1 tablet Oral QHS  . enoxaparin (LOVENOX) injection  40 mg Subcutaneous Q24H  . feeding supplement  1 Container Oral TID BM  . loratadine  10 mg Oral Daily  . multivitamin with minerals  1 tablet Oral QHS  . nicotine  7 mg Transdermal Daily  . polyethylene glycol  17 g Oral Daily  . sodium chloride flush  10-40 mL Intracatheter Q12H   Continuous Infusions: . lactated ringers    . lactated ringers 75 mL/hr at 11/05/19 0617  . piperacillin-tazobactam (ZOSYN)  IV Stopped (11/05/19 0602)          Aline August, MD [facility] 11/05/2019, 7:31 AM

## 2019-11-05 NOTE — Progress Notes (Signed)
Subjective No acute events. Stable soreness in lower abdomen. Spent time reviewing procedure, findings, answering questions and going over plans  Objective: Vital signs in last 24 hours: Temp:  [97.7 F (36.5 C)-98.4 F (36.9 C)] 98.1 F (36.7 C) (09/14 0623) Pulse Rate:  [65-104] 68 (09/14 0623) Resp:  [12-21] 19 (09/14 0623) BP: (138-174)/(82-99) 174/99 (09/14 0623) SpO2:  [94 %-100 %] 98 % (09/14 0623) Last BM Date: 11/04/19  Intake/Output from previous day: 09/13 0701 - 09/14 0700 In: 4532.1 [P.O.:1840; I.V.:2407.5; IV Piggyback:284.6] Out: 2220 [Urine:1900; Stool:300; Blood:20] Intake/Output this shift: No intake/output data recorded.  Gen: NAD, comfortable CV: RRR Pulm: Normal work of breathing Abd: Soft, mildly ttp in lower abdomen. Ileostomy pink and productive. Incisions c/d/i Ext: SCDs in place  Lab Results: CBC  Recent Labs    11/04/19 0855 11/05/19 0335  WBC 5.4 5.6  HGB 8.8* 8.2*  HCT 28.5* 26.9*  PLT 322 338   BMET Recent Labs    11/04/19 0311 11/05/19 0335  NA  --  136  K  --  4.0  CL  --  102  CO2  --  24  GLUCOSE  --  135*  BUN  --  8  CREATININE 0.58 0.56  CALCIUM  --  8.7*   PT/INR No results for input(s): LABPROT, INR in the last 72 hours. ABG No results for input(s): PHART, HCO3 in the last 72 hours.  Invalid input(s): PCO2, PO2  Studies/Results:  Anti-infectives: Anti-infectives (From admission, onward)   Start     Dose/Rate Route Frequency Ordered Stop   11/03/19 0845  cefoTEtan (CEFOTAN) 2 g in sodium chloride 0.9 % 100 mL IVPB  Status:  Discontinued        2 g 200 mL/hr over 30 Minutes Intravenous On call to O.R. 11/03/19 0831 11/03/19 0842   10/25/19 1000  vancomycin (VANCOCIN) IVPB 1000 mg/200 mL premix  Status:  Discontinued        1,000 mg 200 mL/hr over 60 Minutes Intravenous Every 12 hours 10/24/19 1835 10/28/19 1727   10/24/19 2300  piperacillin-tazobactam (ZOSYN) IVPB 3.375 g        3.375 g 12.5 mL/hr over 240  Minutes Intravenous Every 8 hours 10/24/19 1835     10/24/19 2000  vancomycin (VANCOREADY) IVPB 1500 mg/300 mL  Status:  Discontinued        1,500 mg 150 mL/hr over 120 Minutes Intravenous  Once 10/24/19 1835 10/24/19 1836   10/24/19 2000  vancomycin (VANCOREADY) IVPB 1500 mg/300 mL        1,500 mg 150 mL/hr over 120 Minutes Intravenous  Once 10/24/19 1836 10/24/19 2229   10/24/19 1730  piperacillin-tazobactam (ZOSYN) IVPB 3.375 g        3.375 g 100 mL/hr over 30 Minutes Intravenous  Once 10/24/19 1721 10/24/19 1810       Assessment/Plan: Patient Active Problem List   Diagnosis Date Noted  . Intra-abdominal abscess (HCC) 10/24/2019  . Alcoholic ketoacidosis 08/13/2018  . Hyponatremia 08/13/2018  . Hypokalemia 08/13/2018  . Hyponatremia with normal extracellular fluid volume 08/10/2018  . Seizure (HCC) 11/04/2016  . Seizures (HCC) 11/03/2016  . COPD (chronic obstructive pulmonary disease) (HCC) 11/03/2016  . Alcohol abuse 11/03/2016  . Cocaine use 11/03/2016  . Shortness of breath-with exertion 09/23/2013  . Occlusion and stenosis of carotid artery without mention of cerebral infarction 08/22/2011  . Depression   . Anxiety   . Arthritis    s/p Procedure(s): DIAGNOSTIC LAPAROTOMY WITH LAPAROSCOPIC diverting loop ileostomy  11/04/2019  -Diet as tolerated -Pelvic MRI to further evaluate pelvis as this was relatively inaccessible intraoperatively and had peritoneal tenting into the pelvis -WOCN for ostomy teaching -Monitor ileostomy output -CEA, CA 19-9, CA 125   LOS: 12 days   Stephanie Coup. Cliffton Asters, M.D. [facility] Use [email or web address] to contact on call provider

## 2019-11-05 NOTE — Consult Note (Signed)
WOC Nurse ostomy follow up Stoma type/location:  Stomal assessment/size:  Peristomal assessment:  Treatment options for stomal/peristomal skin:  Output  Ostomy pouching: 2pc. 2 1/4 in place from OR; patient refused pouch change today; reported "had the worst night I've had since I've been in here".  Can't engage patient for much teaching today Explained role of ostomy nurse and creation of stoma   Education on emptying when 1/3 to 1/2 full and how to empty; staff have been working with patient  Demonstrated "burping" flatus from pouch Provided patient with [employer] and educational materials for patient and for her girlfriend and marked items currently using Will arrange teaching session with girlfriend [name] on Thursday or Friday based on when patient's friend can come in.    Enrolled patient in [place] Secure Start Discharge program: Yes 4 extra 2 3/4" pouches and barrier rings in the room; measuring guide; 3 2 1/4" pouches in the room as well.  Will provide patient with indigent paperwork; no insurance    WOC Nurse will follow along with you for continued support with ostomy teaching and care Susan Todd [facility] MSN, RN, [place], CNS, [place] [phone]

## 2019-11-06 DIAGNOSIS — F17200 Nicotine dependence, unspecified, uncomplicated: Secondary | ICD-10-CM

## 2019-11-06 DIAGNOSIS — E876 Hypokalemia: Secondary | ICD-10-CM

## 2019-11-06 DIAGNOSIS — D539 Nutritional anemia, unspecified: Secondary | ICD-10-CM

## 2019-11-06 DIAGNOSIS — F329 Major depressive disorder, single episode, unspecified: Secondary | ICD-10-CM

## 2019-11-06 LAB — CBC WITH DIFFERENTIAL/PLATELET
Abs Immature Granulocytes: 0.07 10*3/uL (ref 0.00–0.07)
Basophils Absolute: 0.1 10*3/uL (ref 0.0–0.1)
Basophils Relative: 1 %
Eosinophils Absolute: 0.1 10*3/uL (ref 0.0–0.5)
Eosinophils Relative: 1 %
HCT: 35.8 % — ABNORMAL LOW (ref 36.0–46.0)
Hemoglobin: 10.9 g/dL — ABNORMAL LOW (ref 12.0–15.0)
Immature Granulocytes: 1 %
Lymphocytes Relative: 35 %
Lymphs Abs: 3.9 10*3/uL (ref 0.7–4.0)
MCH: 30.7 pg (ref 26.0–34.0)
MCHC: 30.4 g/dL (ref 30.0–36.0)
MCV: 100.8 fL — ABNORMAL HIGH (ref 80.0–100.0)
Monocytes Absolute: 0.9 10*3/uL (ref 0.1–1.0)
Monocytes Relative: 8 %
Neutro Abs: 6.2 10*3/uL (ref 1.7–7.7)
Neutrophils Relative %: 54 %
Platelets: 609 10*3/uL — ABNORMAL HIGH (ref 150–400)
RBC: 3.55 MIL/uL — ABNORMAL LOW (ref 3.87–5.11)
RDW: 14.7 % (ref 11.5–15.5)
WBC: 11.3 10*3/uL — ABNORMAL HIGH (ref 4.0–10.5)
nRBC: 0 % (ref 0.0–0.2)

## 2019-11-06 LAB — BASIC METABOLIC PANEL
Anion gap: 12 (ref 5–15)
BUN: 8 mg/dL (ref 6–20)
CO2: 25 mmol/L (ref 22–32)
Calcium: 8.8 mg/dL — ABNORMAL LOW (ref 8.9–10.3)
Chloride: 105 mmol/L (ref 98–111)
Creatinine, Ser: 0.61 mg/dL (ref 0.44–1.00)
GFR calc Af Amer: >60 mL/min (ref >60)
GFR calc non Af Amer: >60 mL/min (ref >60)
Glucose, Bld: 114 mg/dL — ABNORMAL HIGH (ref 70–99)
Potassium: 3.1 mmol/L — ABNORMAL LOW (ref 3.5–5.1)
Sodium: 142 mmol/L (ref 135–145)

## 2019-11-06 LAB — MAGNESIUM: Magnesium: 1.6 mg/dL — ABNORMAL LOW (ref 1.7–2.4)

## 2019-11-06 MED ORDER — SODIUM CHLORIDE 0.9 % IV SOLN
INTRAVENOUS | Status: DC | PRN
  Administered 2019-11-06 – 2019-11-09 (×2): 250 mL via INTRAVENOUS

## 2019-11-06 MED ORDER — OXYCODONE HCL 5 MG PO TABS
10.0000 mg | ORAL_TABLET | ORAL | Status: DC | PRN
  Administered 2019-11-06 – 2019-11-13 (×30): 10 mg via ORAL
  Filled 2019-11-06 (×30): qty 2

## 2019-11-06 MED ORDER — IBUPROFEN 400 MG PO TABS
600.0000 mg | ORAL_TABLET | Freq: Three times a day (TID) | ORAL | Status: DC
  Administered 2019-11-06 – 2019-11-09 (×11): 600 mg via ORAL
  Filled 2019-11-06 (×11): qty 1

## 2019-11-06 MED ORDER — METOPROLOL TARTRATE 12.5 MG HALF TABLET
12.5000 mg | ORAL_TABLET | Freq: Two times a day (BID) | ORAL | Status: DC
  Administered 2019-11-06 – 2019-11-08 (×4): 12.5 mg via ORAL
  Filled 2019-11-06 (×4): qty 1

## 2019-11-06 MED ORDER — POTASSIUM CHLORIDE CRYS ER 20 MEQ PO TBCR
40.0000 meq | EXTENDED_RELEASE_TABLET | ORAL | Status: AC
  Administered 2019-11-06 (×2): 40 meq via ORAL
  Filled 2019-11-06 (×2): qty 2

## 2019-11-06 MED ORDER — MAGNESIUM SULFATE 2 GM/50ML IV SOLN
2.0000 g | Freq: Once | INTRAVENOUS | Status: AC
  Administered 2019-11-06: 2 g via INTRAVENOUS
  Filled 2019-11-06: qty 50

## 2019-11-06 NOTE — Progress Notes (Signed)
PROGRESS NOTE  Susan Todd ZOX:[id number] DOB: 04/01/1965   PCP: Georgann HousekeeperHusain, Karrar, MD  Patient is from: Home  DOA: 10/24/2019 LOS: 13  Brief Narrative / Interim history: 54 y.o.femalewith a history of HTN/HLD, anxiety/depression,seizure disorder, COPDand diverticulosis who presented on 10/24/19 with worsening abdominal pain. Pain going on since early July initially with vaginal discharge, seen by her gynecologist and had multiple evaluation and was on different antibiotics subsequently referred to GI. CT of the abdomen and pelvis on 10/23/2019 showed several abscesses within the abdomen coming from vagina/rectal region with concern for potential perforated appendicitis. 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 She was started on broad-spectrum antibiotics. GI and general surgery were consulted.  Flex sigmoidoscopy on 9/8 which showed diverticulosis in the rectosigmoid and sigmoid colon along with stenosis and narrowing.  GI recommended outpatient follow-up for colonoscopy.  Repeat CT abdomen and pelvis on 9/9 showed central loculated abscess within the pelvis which is increased in size and is not amenable to percutaneous drainage given its location.  She underwent diagnostic laparotomy with laparoscopic diverting loop ileostomy on 9/13.  MRI pelvis on 9/14 redemonstrated abscess or fistula cavity in the central pelvis containing air and fluid interspersed between the mid sigmoid colon and rectum measuring 3.7 x 3.1 cm suspicious for abscess or fistula.  General surgery following.   Subjective: Seen and examined earlier this morning.  Continues to endorse significant pain across lower abdomen.  She rates her pain 12/10.  She just received oxycodone and Robaxin about 20 minutes ago.  No other complaints.  Objective: Vitals:   11/05/19 1014 11/05/19 1329 11/05/19 2129 11/06/19 0459  BP: (!) 148/90 (!) 151/95 (!) 150/92 127/83  Pulse: 85 92 91  68  Resp: 18 17 16 16   Temp: 98.2 F (36.8 C)  98.1 F (36.7 C) 98 F (36.7 C)  TempSrc:   Oral   SpO2: 99% 98% 99% 97%  Weight:      Height:        Intake/Output Summary (Last 24 hours) at 11/06/2019 1330 Last data filed at 11/06/2019 0904 Gross per 24 hour  Intake 2745.68 ml  Output 750 ml  Net 1995.68 ml   Filed Weights   10/24/19 0751  Weight: 78.5 kg    Examination:  GENERAL: No apparent distress.  Nontoxic. HEENT: MMM.  Vision and hearing grossly intact.  NECK: Supple.  No apparent JVD.  RESP:  No IWOB.  Fair aeration bilaterally. CVS:  RRR. Heart sounds normal.  ABD/GI/GU: BS+. Abd soft.  Tenderness to palpation to the left of bellybutton.  Stool content in ileostomy bag.  MSK/EXT:  Moves extremities. No apparent deformity. No edema.  SKIN: no apparent skin lesion or wound NEURO: Awake, alert and oriented appropriately.  No apparent focal neuro deficit. PSYCH: Calm. Normal affect.   Procedures:  9/8-Flex sigmoidoscopy revealed diverticulosis in the rectosigmoid and sigmoid colon along with the stenosis and narrowing.  Biopsies from colon and rectum negative for inflammation or malignancy 9/13-diagnostic laparotomy with laparoscopic diverting loop ileostomy  Microbiology summarized: 9/2-COVID-19 PCR negative. 9/2-blood cultures negative.  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 -Abscess reportedly not amenable to IR drainage given its location. -CT abdomen and pelvis and MRI pelvis as above -S/p laparotomy with laparoscopic diverting loop ileostomy as above -Continue ostomy care and IV Zosyn -Pain control-on scheduled Tylenol and Robaxin, as needed gabapentin, oxycodone and IV morphine -General surgery following.  Essential  hypertension: Normotensive off home medications (losartan and metoprolol). -Resume low-dose metoprolol.  Macrocytic anemia: H&H  stable.  Anemia panel within normal. -Monitor  Chronic COPD: Stable. -Continue as needed albuterol  Tobacco use disorder: -Encouraged tobacco cessation -Continue nicotine patch  Hypokalemia/hypomagnesemia: K3.1.  Mg 1.6. -K-Dur 80 mEq -IV magnesium sulfate, 2 g  History of seizure disorder: Stable.  He does not seem to be on medication at home.  Anxiety and depression: Stable -Continue home as needed Ativan   Body mass index is 26.31 kg/m.         DVT prophylaxis:  enoxaparin (LOVENOX) injection 40 mg Start: 10/24/19 2200 SCDs Start: 10/24/19 1820  Code Status: Full code Family Communication: Patient and/or RN. Available if any question. Status is: Inpatient  Remains inpatient appropriate because:IV treatments appropriate due to intensity of illness or inability to take PO and Inpatient level of care appropriate due to severity of illness   Dispo: The patient is from: Home              Anticipated d/c is to: Home              Anticipated d/c date is: > 3 days              Patient currently is not medically stable to d/c.       Consultants:  GI-signed off General surgery   Sch Meds:  Scheduled Meds: . acetaminophen  1,000 mg Oral Q6H  . aspirin EC  81 mg Oral Daily  . atorvastatin  10 mg Oral QPM  . calcium-vitamin D  1 tablet Oral QHS  . enoxaparin (LOVENOX) injection  40 mg Subcutaneous Q24H  . feeding supplement  1 Container Oral TID BM  . loratadine  10 mg Oral Daily  . methocarbamol  1,000 mg Oral TID  . multivitamin with minerals  1 tablet Oral QHS  . nicotine  7 mg Transdermal Daily  . sodium chloride flush  10-40 mL Intracatheter Q12H   Continuous Infusions: . lactated ringers    . lactated ringers 75 mL/hr at 11/05/19 2354  . piperacillin-tazobactam (ZOSYN)  IV 3.375 g (11/06/19 1024)   PRN Meds:.albuterol, gabapentin, LORazepam, morphine injection, ondansetron **OR** ondansetron (ZOFRAN) IV, oxyCODONE, sodium chloride  flush  Antimicrobials: Anti-infectives (From admission, onward)   Start     Dose/Rate Route Frequency Ordered Stop   11/03/19 0845  cefoTEtan (CEFOTAN) 2 g in sodium chloride 0.9 % 100 mL IVPB  Status:  Discontinued        2 g 200 mL/hr over 30 Minutes Intravenous On call to O.R. 11/03/19 0831 11/03/19 0842   10/25/19 1000  vancomycin (VANCOCIN) IVPB 1000 mg/200 mL premix  Status:  Discontinued        1,000 mg 200 mL/hr over 60 Minutes Intravenous Every 12 hours 10/24/19 1835 10/28/19 1727   10/24/19 2300  piperacillin-tazobactam (ZOSYN) IVPB 3.375 g        3.375 g 12.5 mL/hr over 240 Minutes Intravenous Every 8 hours 10/24/19 1835     10/24/19 2000  vancomycin (VANCOREADY) IVPB 1500 mg/300 mL  Status:  Discontinued        1,500 mg 150 mL/hr over 120 Minutes Intravenous  Once 10/24/19 1835 10/24/19 1836   10/24/19 2000  vancomycin (VANCOREADY) IVPB 1500 mg/300 mL        1,500 mg 150 mL/hr over 120 Minutes Intravenous  Once 10/24/19 1836 10/24/19 2229   10/24/19 1730  piperacillin-tazobactam (ZOSYN) IVPB 3.375 g  3.375 g 100 mL/hr over 30 Minutes Intravenous  Once 10/24/19 1721 10/24/19 1810       I have personally reviewed the following labs and images: CBC: Recent Labs  Lab 10/31/19 0427 10/31/19 0427 11/01/19 0326 11/02/19 0345 11/04/19 0855 11/05/19 0335 11/06/19 0349  WBC 6.3   < > 7.9 5.7 5.4 5.6 11.3*  NEUTROABS 3.0  --  4.2 2.7  --  3.8 6.2  HGB 9.3*   < > 8.9* 7.9* 8.8* 8.2* 10.9*  HCT 30.0*   < > 29.6* 25.6* 28.5* 26.9* 35.8*  MCV 102.0*   < > 103.1* 102.4* 100.7* 101.5* 100.8*  PLT 324   < > 358 283 322 338 609*   < > = values in this interval not displayed.   BMP &GFR Recent Labs  Lab 10/31/19 0427 10/31/19 0427 11/01/19 0326 11/01/19 0326 11/02/19 0345 11/03/19 0327 11/04/19 0311 11/05/19 0335 11/06/19 0349  NA 139  --  139  --  139  --   --  136 142  K 3.5  --  3.3*  --  3.4*  --   --  4.0 3.1*  CL 107  --  108  --  105  --   --  102 105   CO2 27  --  22  --  25  --   --  24 25  GLUCOSE 101*  --  95  --  104*  --   --  135* 114*  BUN 5*  --  <5*  --  6  --   --  8 8  CREATININE 0.81   < > 0.70   < > 0.64 0.62 0.58 0.56 0.61  CALCIUM 9.1  --  8.9  --  8.5*  --   --  8.7* 8.8*  MG 1.8  --  1.7  --  1.7  --   --  1.7 1.6*   < > = values in this interval not displayed.   Estimated Creatinine Clearance: 86.4 mL/min (by C-G formula based on SCr of 0.61 mg/dL). Liver & Pancreas: Recent Labs  Lab 10/31/19 0427  AST 12*  ALT 9  ALKPHOS 94  BILITOT 0.6  PROT 5.9*  ALBUMIN 2.8*   No results for input(s): LIPASE, AMYLASE in the last 168 hours. No results for input(s): AMMONIA in the last 168 hours. Diabetic: No results for input(s): HGBA1C in the last 72 hours. No results for input(s): GLUCAP in the last 168 hours. Cardiac Enzymes: No results for input(s): CKTOTAL, CKMB, CKMBINDEX, TROPONINI in the last 168 hours. No results for input(s): PROBNP in the last 8760 hours. Coagulation Profile: No results for input(s): INR, PROTIME in the last 168 hours. Thyroid Function Tests: No results for input(s): TSH, T4TOTAL, FREET4, T3FREE, THYROIDAB in the last 72 hours. Lipid Profile: No results for input(s): CHOL, HDL, LDLCALC, TRIG, CHOLHDL, LDLDIRECT in the last 72 hours. Anemia Panel: No results for input(s): VITAMINB12, FOLATE, FERRITIN, TIBC, IRON, RETICCTPCT in the last 72 hours. Urine analysis:    Component Value Date/Time   COLORURINE YELLOW 10/24/2019 1822   APPEARANCEUR TURBID (A) 10/24/2019 1822   LABSPEC 1.013 10/24/2019 1822   PHURINE 5.0 10/24/2019 1822   GLUCOSEU NEGATIVE 10/24/2019 1822   HGBUR MODERATE (A) 10/24/2019 1822   BILIRUBINUR NEGATIVE 10/24/2019 1822   KETONESUR 5 (A) 10/24/2019 1822   PROTEINUR 100 (A) 10/24/2019 1822   UROBILINOGEN 0.2 08/23/2006 1144   NITRITE NEGATIVE 10/24/2019 1822   LEUKOCYTESUR LARGE (A) 10/24/2019 1822  Sepsis Labs: Invalid input(s): PROCALCITONIN,  LACTICIDVEN  Microbiology: Recent Results (from the past 240 hour(s))  Surgical PCR screen     Status: None   Collection Time: 11/03/19 11:23 PM   Specimen: Nasal Mucosa; Nasal Swab  Result Value Ref Range Status   MRSA, PCR NEGATIVE NEGATIVE Final   Staphylococcus aureus NEGATIVE NEGATIVE Final    Comment: (NOTE) The Xpert SA Assay (FDA approved for NASAL specimens in patients 62 years of age and older), is one component of a comprehensive surveillance program. It is not intended to diagnose infection nor to guide or monitor treatment. Performed at [facility], [address] [place], [place] [address]     Radiology Studies: No results found.    Rahiem Schellinger T. Kiersten Coss [facility]  If 7PM-7AM, please contact night-coverage [email or web address] 11/06/2019, 1:30 PM

## 2019-11-06 NOTE — Progress Notes (Signed)
Subjective No acute events. Stable soreness in lower abdomen. Worked with WOC RN on ostomy care yesterday. Pouch leaked overnight when she tried to burp it. Tolerating PO but decreased appetite.   Objective: Vital signs in last 24 hours: Temp:  [98 F (36.7 C)-98.1 F (36.7 C)] 98 F (36.7 C) (09/15 0459) Pulse Rate:  [68-92] 68 (09/15 0459) Resp:  [16-17] 16 (09/15 0459) BP: (127-151)/(83-95) 127/83 (09/15 0459) SpO2:  [97 %-99 %] 97 % (09/15 0459) Last BM Date: 11/06/19  Intake/Output from previous day: 09/14 0701 - 09/15 0700 In: 2745.7 [P.O.:940; I.V.:1605.6; IV Piggyback:200.1] Out: 1350 [Urine:600; Stool:750] Intake/Output this shift: Total I/O In: 240 [P.O.:240] Out: -   Gen: NAD, comfortable CV: RRR Pulm: Normal work of breathing Abd: Soft, mildly ttp in lower abdomen. Ileostomy pink and productive with liquid bilious stool (750 cc/24h documented + leak) Incisions c/d/i  Ext: SCDs in place  Lab Results: CBC  Recent Labs    11/05/19 0335 11/06/19 0349  WBC 5.6 11.3*  HGB 8.2* 10.9*  HCT 26.9* 35.8*  PLT 338 609*   BMET Recent Labs    11/05/19 0335 11/06/19 0349  NA 136 142  K 4.0 3.1*  CL 102 105  CO2 24 25  GLUCOSE 135* 114*  BUN 8 8  CREATININE 0.56 0.61  CALCIUM 8.7* 8.8*   PT/INR No results for input(s): LABPROT, INR in the last 72 hours. ABG No results for input(s): PHART, HCO3 in the last 72 hours.  Invalid input(s): PCO2, PO2  Studies/Results:  Anti-infectives: Anti-infectives (From admission, onward)   Start     Dose/Rate Route Frequency Ordered Stop   11/03/19 0845  cefoTEtan (CEFOTAN) 2 g in sodium chloride 0.9 % 100 mL IVPB  Status:  Discontinued        2 g 200 mL/hr over 30 Minutes Intravenous On call to O.R. 11/03/19 0831 11/03/19 0842   10/25/19 1000  vancomycin (VANCOCIN) IVPB 1000 mg/200 mL premix  Status:  Discontinued        1,000 mg 200 mL/hr over 60 Minutes Intravenous Every 12 hours 10/24/19 1835 10/28/19 1727    10/24/19 2300  piperacillin-tazobactam (ZOSYN) IVPB 3.375 g        3.375 g 12.5 mL/hr over 240 Minutes Intravenous Every 8 hours 10/24/19 1835     10/24/19 2000  vancomycin (VANCOREADY) IVPB 1500 mg/300 mL  Status:  Discontinued        1,500 mg 150 mL/hr over 120 Minutes Intravenous  Once 10/24/19 1835 10/24/19 1836   10/24/19 2000  vancomycin (VANCOREADY) IVPB 1500 mg/300 mL        1,500 mg 150 mL/hr over 120 Minutes Intravenous  Once 10/24/19 1836 10/24/19 2229   10/24/19 1730  piperacillin-tazobactam (ZOSYN) IVPB 3.375 g        3.375 g 100 mL/hr over 30 Minutes Intravenous  Once 10/24/19 1721 10/24/19 1810       Assessment/Plan: Patient Active Problem List   Diagnosis Date Noted  . Intra-abdominal abscess (HCC) 10/24/2019  . Alcoholic ketoacidosis 08/13/2018  . Hyponatremia 08/13/2018  . Hypokalemia 08/13/2018  . Hyponatremia with normal extracellular fluid volume 08/10/2018  . Seizure (HCC) 11/04/2016  . Seizures (HCC) 11/03/2016  . COPD (chronic obstructive pulmonary disease) (HCC) 11/03/2016  . Alcohol abuse 11/03/2016  . Cocaine use 11/03/2016  . Shortness of breath-with exertion 09/23/2013  . Occlusion and stenosis of carotid artery without mention of cerebral infarction 08/22/2011  . Depression   . Anxiety   . Arthritis  s/p Procedure(s): DIAGNOSTIC LAPAROTOMY WITH LAPAROSCOPIC diverting loop ileostomy 11/04/2019  -Diet as tolerated -Pelvic MRI shows previously noted inflammatory changes about the sigmoid/rectum, colovaginal fistula, and ovarian cyst, no other mass suspicious for malignancy identified. Tumor markers pending  -WOCN for ostomy teaching -Monitor ileostomy output -- may need imodium.     LOS: 13 days   Hosie Spangle, [facility] Use [email or web address] to contact on call provider

## 2019-11-07 LAB — RENAL FUNCTION PANEL
Albumin: 2.3 g/dL — ABNORMAL LOW (ref 3.5–5.0)
Anion gap: 8 (ref 5–15)
BUN: <5 mg/dL — ABNORMAL LOW (ref 6–20)
CO2: 26 mmol/L (ref 22–32)
Calcium: 8.4 mg/dL — ABNORMAL LOW (ref 8.9–10.3)
Chloride: 105 mmol/L (ref 98–111)
Creatinine, Ser: 0.67 mg/dL (ref 0.44–1.00)
GFR calc Af Amer: >60 mL/min (ref >60)
GFR calc non Af Amer: >60 mL/min (ref >60)
Glucose, Bld: 105 mg/dL — ABNORMAL HIGH (ref 70–99)
Phosphorus: 4 mg/dL (ref 2.5–4.6)
Potassium: 3.9 mmol/L (ref 3.5–5.1)
Sodium: 139 mmol/L (ref 135–145)

## 2019-11-07 LAB — CBC
HCT: 29.7 % — ABNORMAL LOW (ref 36.0–46.0)
Hemoglobin: 9.1 g/dL — ABNORMAL LOW (ref 12.0–15.0)
MCH: 31.2 pg (ref 26.0–34.0)
MCHC: 30.6 g/dL (ref 30.0–36.0)
MCV: 101.7 fL — ABNORMAL HIGH (ref 80.0–100.0)
Platelets: 389 10*3/uL (ref 150–400)
RBC: 2.92 MIL/uL — ABNORMAL LOW (ref 3.87–5.11)
RDW: 14.9 % (ref 11.5–15.5)
WBC: 7.4 10*3/uL (ref 4.0–10.5)
nRBC: 0 % (ref 0.0–0.2)

## 2019-11-07 LAB — CEA: CEA: 4.4 ng/mL (ref 0.0–4.7)

## 2019-11-07 LAB — CREATININE, SERUM
Creatinine, Ser: 0.66 mg/dL (ref 0.44–1.00)
GFR calc Af Amer: >60 mL/min (ref >60)
GFR calc non Af Amer: >60 mL/min (ref >60)

## 2019-11-07 LAB — MAGNESIUM: Magnesium: 1.7 mg/dL (ref 1.7–2.4)

## 2019-11-07 LAB — CANCER ANTIGEN 19-9: CA 19-9: <2 U/mL (ref 0–35)

## 2019-11-07 LAB — CA 125: Cancer Antigen (CA) 125: 29.8 U/mL (ref 0.0–38.1)

## 2019-11-07 NOTE — Progress Notes (Signed)
PROGRESS NOTE  Susan Todd OEH:[id number] DOB: 28-Jul-1965   PCP: Georgann Housekeeper, MD  Patient is from: Home  DOA: 10/24/2019 LOS: 14  Brief Narrative / Interim history: 54 y.o.femalewith a history of HTN/HLD, anxiety/depression,seizure disorder, COPDand diverticulosis who presented on 10/24/19 with worsening abdominal pain. Pain going on since early July initially with vaginal discharge, seen by her gynecologist and had multiple evaluation and was on different antibiotics subsequently referred to GI. CT of the abdomen and pelvis on 10/23/2019 showed several abscesses within the abdomen coming from vagina/rectal region with concern for potential perforated appendicitis. 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 She was started on broad-spectrum antibiotics. GI and general surgery were consulted.  Flex sigmoidoscopy on 9/8 which showed diverticulosis in the rectosigmoid and sigmoid colon along with stenosis and narrowing.  GI recommended outpatient follow-up for colonoscopy.  Repeat CT abdomen and pelvis on 9/9 showed central loculated abscess within the pelvis which is increased in size and is not amenable to percutaneous drainage given its location.  She underwent diagnostic laparotomy with laparoscopic diverting loop ileostomy on 9/13.  MRI pelvis on 9/14 redemonstrated abscess or fistula cavity in the central pelvis containing air and fluid interspersed between the mid sigmoid colon and rectum measuring 3.7 x 3.1 cm suspicious for abscess or fistula.  General surgery following.   Subjective: Seen and examined earlier this morning.  No major events overnight of this morning.  Reports improvement in her pain.  She felt a little nauseous but no emesis.  Appetite improved.  She feels she would be able to get out and work with therapy today.  Objective: Vitals:   11/06/19 0459 11/06/19 1334 11/06/19 2211 11/07/19 0526  BP: 127/83 132/83 133/82  134/88  Pulse: 68 68 71 70  Resp: 16  17 16   Temp: 98 F (36.7 C) 97.7 F (36.5 C) 98.6 F (37 C) 98.4 F (36.9 C)  TempSrc:  Oral Oral Oral  SpO2: 97% 97% 97% 97%  Weight:      Height:        Intake/Output Summary (Last 24 hours) at 11/07/2019 1356 Last data filed at 11/07/2019 0900 Gross per 24 hour  Intake 2571.53 ml  Output 150 ml  Net 2421.53 ml   Filed Weights   10/24/19 0751  Weight: 78.5 kg    Examination:  GENERAL: No apparent distress.  Nontoxic. HEENT: MMM.  Vision and hearing grossly intact.  NECK: Supple.  No apparent JVD.  RESP:  No IWOB.  Fair aeration bilaterally. CVS:  RRR. Heart sounds normal.  ABD/GI/GU: BS+. Abd soft.  Slight tenderness to palpation on the left. Ileostomy. MSK/EXT:  Moves extremities. No apparent deformity. No edema.  SKIN: no apparent skin lesion or wound NEURO: Awake, alert and oriented appropriately.  No apparent focal neuro deficit. PSYCH: Calm. Normal affect.  Procedures:  9/8-Flex sigmoidoscopy revealed diverticulosis in the rectosigmoid and sigmoid colon along with the stenosis and narrowing.  Biopsies from colon and rectum negative for inflammation or malignancy 9/13-diagnostic laparotomy with laparoscopic diverting loop ileostomy  Microbiology summarized: 9/2-COVID-19 PCR negative. 9/2-blood cultures negative.  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 -Abscess reportedly not amenable to IR drainage given its location. -CT abdomen and pelvis and MRI pelvis as above -S/p laparotomy with laparoscopic diverting loop ileostomy as above -Continue ostomy care and IV Zosyn.  Appreciate WOCN -Pain control-on scheduled Tylenol and Robaxin, as needed gabapentin, oxycodone and IV  morphine -General surgery following.  Essential hypertension: Normotensive off home medications (losartan and metoprolol). -Resume low-dose  metoprolol.  Macrocytic anemia: H&H stable.  Anemia panel within normal. -Monitor  Chronic COPD: Stable. -Continue as needed albuterol  Tobacco use disorder: -Encouraged tobacco cessation -Continue nicotine patch  Hypokalemia/hypomagnesemia: resolved. -Monitor and replenish as appropriate  History of seizure disorder: Stable.  He does not seem to be on medication at home.  Anxiety and depression: Stable -Continue home as needed Ativan  Leukocytosis: Likely due to #1.  Resolved.   Body mass index is 26.31 kg/m.         DVT prophylaxis:  enoxaparin (LOVENOX) injection 40 mg Start: 10/24/19 2200 SCDs Start: 10/24/19 1820  Code Status: Full code Family Communication: Patient and/or RN. Available if any question. Status is: Inpatient  Remains inpatient appropriate because:IV treatments appropriate due to intensity of illness or inability to take PO and Inpatient level of care appropriate due to severity of illness   Dispo: The patient is from: Home              Anticipated d/c is to: Home              Anticipated d/c date is: > 3 days              Patient currently is not medically stable to d/c.       Consultants:  GI-signed off General surgery   Sch Meds:  Scheduled Meds: . acetaminophen  1,000 mg Oral Q6H  . aspirin EC  81 mg Oral Daily  . atorvastatin  10 mg Oral QPM  . calcium-vitamin D  1 tablet Oral QHS  . enoxaparin (LOVENOX) injection  40 mg Subcutaneous Q24H  . feeding supplement  1 Container Oral TID BM  . ibuprofen  600 mg Oral TID  . loratadine  10 mg Oral Daily  . methocarbamol  1,000 mg Oral TID  . metoprolol tartrate  12.5 mg Oral BID  . multivitamin with minerals  1 tablet Oral QHS  . nicotine  7 mg Transdermal Daily  . sodium chloride flush  10-40 mL Intracatheter Q12H   Continuous Infusions: . sodium chloride 10 mL/hr at 11/07/19 0600  . lactated ringers    . lactated ringers 75 mL/hr at 11/07/19 0600  . piperacillin-tazobactam  (ZOSYN)  IV 3.375 g (11/07/19 1028)   PRN Meds:.sodium chloride, albuterol, gabapentin, LORazepam, morphine injection, ondansetron **OR** ondansetron (ZOFRAN) IV, oxyCODONE, sodium chloride flush  Antimicrobials: Anti-infectives (From admission, onward)   Start     Dose/Rate Route Frequency Ordered Stop   11/03/19 0845  cefoTEtan (CEFOTAN) 2 g in sodium chloride 0.9 % 100 mL IVPB  Status:  Discontinued        2 g 200 mL/hr over 30 Minutes Intravenous On call to O.R. 11/03/19 0831 11/03/19 0842   10/25/19 1000  vancomycin (VANCOCIN) IVPB 1000 mg/200 mL premix  Status:  Discontinued        1,000 mg 200 mL/hr over 60 Minutes Intravenous Every 12 hours 10/24/19 1835 10/28/19 1727   10/24/19 2300  piperacillin-tazobactam (ZOSYN) IVPB 3.375 g        3.375 g 12.5 mL/hr over 240 Minutes Intravenous Every 8 hours 10/24/19 1835     10/24/19 2000  vancomycin (VANCOREADY) IVPB 1500 mg/300 mL  Status:  Discontinued        1,500 mg 150 mL/hr over 120 Minutes Intravenous  Once 10/24/19 1835 10/24/19 1836   10/24/19 2000  vancomycin (VANCOREADY) IVPB 1500  mg/300 mL        1,500 mg 150 mL/hr over 120 Minutes Intravenous  Once 10/24/19 1836 10/24/19 2229   10/24/19 1730  piperacillin-tazobactam (ZOSYN) IVPB 3.375 g        3.375 g 100 mL/hr over 30 Minutes Intravenous  Once 10/24/19 1721 10/24/19 1810       I have personally reviewed the following labs and images: CBC: Recent Labs  Lab 11/01/19 0326 11/01/19 0326 11/02/19 0345 11/04/19 0855 11/05/19 0335 11/06/19 0349 11/07/19 0418  WBC 7.9   < > 5.7 5.4 5.6 11.3* 7.4  NEUTROABS 4.2  --  2.7  --  3.8 6.2  --   HGB 8.9*   < > 7.9* 8.8* 8.2* 10.9* 9.1*  HCT 29.6*   < > 25.6* 28.5* 26.9* 35.8* 29.7*  MCV 103.1*   < > 102.4* 100.7* 101.5* 100.8* 101.7*  PLT 358   < > 283 322 338 609* 389   < > = values in this interval not displayed.   BMP &GFR Recent Labs  Lab 11/01/19 0326 11/01/19 0326 11/02/19 0345 11/02/19 0345 11/03/19 0327  11/04/19 0311 11/05/19 0335 11/06/19 0349 11/07/19 0418  NA 139  --  139  --   --   --  136 142 139  K 3.3*  --  3.4*  --   --   --  4.0 3.1* 3.9  CL 108  --  105  --   --   --  102 105 105  CO2 22  --  25  --   --   --  24 25 26   GLUCOSE 95  --  104*  --   --   --  135* 114* 105*  BUN <5*  --  6  --   --   --  8 8 <5*  CREATININE 0.70   < > 0.64   < > 0.62 0.58 0.56 0.61 0.67  0.66  CALCIUM 8.9  --  8.5*  --   --   --  8.7* 8.8* 8.4*  MG 1.7  --  1.7  --   --   --  1.7 1.6* 1.7  PHOS  --   --   --   --   --   --   --   --  4.0   < > = values in this interval not displayed.   Estimated Creatinine Clearance: 86.4 mL/min (by C-G formula based on SCr of 0.66 mg/dL). Liver & Pancreas: Recent Labs  Lab 11/07/19 0418  ALBUMIN 2.3*   No results for input(s): LIPASE, AMYLASE in the last 168 hours. No results for input(s): AMMONIA in the last 168 hours. Diabetic: No results for input(s): HGBA1C in the last 72 hours. No results for input(s): GLUCAP in the last 168 hours. Cardiac Enzymes: No results for input(s): CKTOTAL, CKMB, CKMBINDEX, TROPONINI in the last 168 hours. No results for input(s): PROBNP in the last 8760 hours. Coagulation Profile: No results for input(s): INR, PROTIME in the last 168 hours. Thyroid Function Tests: No results for input(s): TSH, T4TOTAL, FREET4, T3FREE, THYROIDAB in the last 72 hours. Lipid Profile: No results for input(s): CHOL, HDL, LDLCALC, TRIG, CHOLHDL, LDLDIRECT in the last 72 hours. Anemia Panel: No results for input(s): VITAMINB12, FOLATE, FERRITIN, TIBC, IRON, RETICCTPCT in the last 72 hours. Urine analysis:    Component Value Date/Time   COLORURINE YELLOW 10/24/2019 1822   APPEARANCEUR TURBID (A) 10/24/2019 1822   LABSPEC 1.013 10/24/2019 1822  PHURINE 5.0 10/24/2019 1822   GLUCOSEU NEGATIVE 10/24/2019 1822   HGBUR MODERATE (A) 10/24/2019 1822   BILIRUBINUR NEGATIVE 10/24/2019 1822   KETONESUR 5 (A) 10/24/2019 1822   PROTEINUR 100 (A)  10/24/2019 1822   UROBILINOGEN 0.2 08/23/2006 1144   NITRITE NEGATIVE 10/24/2019 1822   LEUKOCYTESUR LARGE (A) 10/24/2019 1822   Sepsis Labs: Invalid input(s): PROCALCITONIN, LACTICIDVEN  Microbiology: Recent Results (from the past 240 hour(s))  Surgical PCR screen     Status: None   Collection Time: 11/03/19 11:23 PM   Specimen: Nasal Mucosa; Nasal Swab  Result Value Ref Range Status   MRSA, PCR NEGATIVE NEGATIVE Final   Staphylococcus aureus NEGATIVE NEGATIVE Final    Comment: (NOTE) The Xpert SA Assay (FDA approved for NASAL specimens in patients 83 years of age and older), is one component of a comprehensive surveillance program. It is not intended to diagnose infection nor to guide or monitor treatment. Performed at [facility], [address], [place], [place] [address]     Radiology Studies: No results found.    Cristine Daw T. Anikah Hogge [facility]  If 7PM-7AM, please contact night-coverage [email or web address] 11/07/2019, 1:56 PM

## 2019-11-07 NOTE — Consult Note (Signed)
WOC Nurse ostomy follow up Stoma type/location: RLQ, loop ileostomy  Stomal assessment/size: 1 3/4" x 1 1/4" slightly oblong, functional os central of proximal limb/ distal os at skin level and flush   Support rod removed per Dr. Lucilla Lame note from 11/07/19. Patient tolerated without problems   -Ok for stoma rod to be removed today during her ostomy teaching by WOCN  Peristomal assessment: intact Treatment options for stomal/peristomal skin: 2" barrier ring Output seedy green with green liquid  Ostomy pouching: 2pc. 2 3/4" with 2" barrier ring Education provided:  Explained role of ostomy nurse and creation of stoma; multiple times to patient and friend Explained stoma characteristics (budded, flush, color, texture, care); multiple times Demonstrated pouch change (cutting new skin barrier, measuring stoma, cleaning peristomal skin and stoma, use of barrier ring) Allowed friend to cut and patient to cut skin barrier Allowed patient and friend to open and close lock and roll closure Patient is independent emptying when 1/3 to 1/2 full  Demonstrated "burping" flatus from pouch Demonstrated use of wick to clean spout  Discussed bathing, diet, gas, medication use, constipation  Provided patient with [employer] and marked items currently using; patient is indigent; provided paperwork and explained need for her to follow up directly.   Enrolled patient in [place] Secure Start Discharge program: Yes, will need support for indigent supplies.   3 2 1/4" pouching systems 2 2 3/4" pouching systems 3 barrier rings in the patients room for use and DC   May need to replenish due to lack of resources at [facility] Nurse will follow along with you for continued support with ostomy teaching and care Quadarius Henton [facility] MSN, RN, [place], CNS, [place] [phone]

## 2019-11-07 NOTE — Progress Notes (Signed)
Subjective No acute events. Overall feeling better each day. Appetite picking up. Abdominal pain improving daily. Ostomy working well   Objective: Vital signs in last 24 hours: Temp:  [97.7 F (36.5 C)-98.6 F (37 C)] 98.4 F (36.9 C) (09/16 0526) Pulse Rate:  [68-71] 70 (09/16 0526) Resp:  [16-17] 16 (09/16 0526) BP: (132-134)/(82-88) 134/88 (09/16 0526) SpO2:  [97 %] 97 % (09/16 0526) Last BM Date: 11/06/19  Intake/Output from previous day: 09/15 0701 - 09/16 0700 In: 2969.6 [P.O.:1320; I.V.:1491.4; IV Piggyback:158.2] Out: 150 [Stool:150] Intake/Output this shift: Total I/O In: 90 [P.O.:90] Out: 0   Gen: NAD, comfortable CV: RRR Pulm: Normal work of breathing Abd: Soft, mildly ttp in lower abdomen. Ileostomy pink and productive with liquid bilious stool (150 cc/24h documented) Incisions c/d/i  Ext: SCDs in place  Lab Results: CBC  Recent Labs    11/06/19 0349 11/07/19 0418  WBC 11.3* 7.4  HGB 10.9* 9.1*  HCT 35.8* 29.7*  PLT 609* 389   BMET Recent Labs    11/06/19 0349 11/07/19 0418  NA 142 139  K 3.1* 3.9  CL 105 105  CO2 25 26  GLUCOSE 114* 105*  BUN 8 <5*  CREATININE 0.61 0.67  0.66  CALCIUM 8.8* 8.4*   PT/INR No results for input(s): LABPROT, INR in the last 72 hours. ABG No results for input(s): PHART, HCO3 in the last 72 hours.  Invalid input(s): PCO2, PO2  Studies/Results:  Anti-infectives: Anti-infectives (From admission, onward)   Start     Dose/Rate Route Frequency Ordered Stop   11/03/19 0845  cefoTEtan (CEFOTAN) 2 g in sodium chloride 0.9 % 100 mL IVPB  Status:  Discontinued        2 g 200 mL/hr over 30 Minutes Intravenous On call to O.R. 11/03/19 0831 11/03/19 0842   10/25/19 1000  vancomycin (VANCOCIN) IVPB 1000 mg/200 mL premix  Status:  Discontinued        1,000 mg 200 mL/hr over 60 Minutes Intravenous Every 12 hours 10/24/19 1835 10/28/19 1727   10/24/19 2300  piperacillin-tazobactam (ZOSYN) IVPB 3.375 g        3.375  g 12.5 mL/hr over 240 Minutes Intravenous Every 8 hours 10/24/19 1835     10/24/19 2000  vancomycin (VANCOREADY) IVPB 1500 mg/300 mL  Status:  Discontinued        1,500 mg 150 mL/hr over 120 Minutes Intravenous  Once 10/24/19 1835 10/24/19 1836   10/24/19 2000  vancomycin (VANCOREADY) IVPB 1500 mg/300 mL        1,500 mg 150 mL/hr over 120 Minutes Intravenous  Once 10/24/19 1836 10/24/19 2229   10/24/19 1730  piperacillin-tazobactam (ZOSYN) IVPB 3.375 g        3.375 g 100 mL/hr over 30 Minutes Intravenous  Once 10/24/19 1721 10/24/19 1810       Assessment/Plan: Patient Active Problem List   Diagnosis Date Noted  . Intra-abdominal abscess (HCC) 10/24/2019  . Alcoholic ketoacidosis 08/13/2018  . Hyponatremia 08/13/2018  . Hypokalemia 08/13/2018  . Hyponatremia with normal extracellular fluid volume 08/10/2018  . Seizure (HCC) 11/04/2016  . Seizures (HCC) 11/03/2016  . COPD (chronic obstructive pulmonary disease) (HCC) 11/03/2016  . Alcohol abuse 11/03/2016  . Cocaine use 11/03/2016  . Shortness of breath-with exertion 09/23/2013  . Occlusion and stenosis of carotid artery without mention of cerebral infarction 08/22/2011  . Depression   . Anxiety   . Arthritis    s/p Procedure(s): DIAGNOSTIC LAPAROTOMY WITH LAPAROSCOPIC diverting loop ileostomy 11/04/2019  -Diet  as tolerated -Ok for stoma rod to be removed today during her ostomy teaching by WOCN -Tumor markers pending  -Monitor ileostomy output    LOS: 14 days   Marin Olp, M.D. [facility] Use [email or web address] to contact on call provider

## 2019-11-08 LAB — CREATININE, SERUM
Creatinine, Ser: 0.77 mg/dL (ref 0.44–1.00)
GFR calc Af Amer: >60 mL/min (ref >60)
GFR calc non Af Amer: >60 mL/min (ref >60)

## 2019-11-08 MED ORDER — LORAZEPAM 0.5 MG PO TABS
0.5000 mg | ORAL_TABLET | Freq: Four times a day (QID) | ORAL | Status: DC | PRN
  Administered 2019-11-08 – 2019-11-13 (×12): 0.5 mg via ORAL
  Filled 2019-11-08 (×12): qty 1

## 2019-11-08 MED ORDER — PSYLLIUM 95 % PO PACK
1.0000 | PACK | Freq: Every day | ORAL | Status: DC
  Administered 2019-11-08 – 2019-11-24 (×6): 1 via ORAL
  Filled 2019-11-08 (×17): qty 1

## 2019-11-08 MED ORDER — METOPROLOL TARTRATE 25 MG PO TABS
25.0000 mg | ORAL_TABLET | Freq: Two times a day (BID) | ORAL | Status: DC
  Administered 2019-11-08 – 2019-11-13 (×10): 25 mg via ORAL
  Filled 2019-11-08 (×10): qty 1

## 2019-11-08 NOTE — Consult Note (Signed)
WOC Nurse ostomy follow up Stoma type/location: RLQ, end ileostomy  Stomal assessment/size: 1 3/4" x 1 1/4" oblong Peristomal assessment: NA Treatment options for stomal/peristomal skin: used 2" skin barrier with pouch change yesterday Output thicker green stool today, new dose of Metamucil added  Ostomy pouching: 2pc. 2 3/4" in place and secure from pouch change.  Education provided:  Patient discussed questions related to indigent program and I assisted with the items she would need and discussed the program coverage for 3 months.  Limited supplies covered.  Discussed emptying her pouch again with her; she was able to sit on the toliet last evening and empty that way. She is feeling much less anxious about this now.  She becomes upset and crying over need to change pouch without support and fear of not understanding how.  She has supportive caregiver and I reminded her of that.  She is calmed down once we review pouch change steps again verbally.  She has extra supplies in the room should the weekend staff need this and I have updated the orders to request staff to encourage her participation in a pouch change over the weekend should she need that.  Reinforced and provided emotional support for her abilities to assist with pouch change yesterday.   Enrolled patient in [place] Secure Start Discharge program: Yes  Patient to contact [place] today for indigent services.   WOC Nurse will follow along with you for continued support with ostomy teaching and care [facility], RN, [place], CNS, [place] [phone]

## 2019-11-08 NOTE — Progress Notes (Signed)
PROGRESS NOTE  Susan Todd DXI:[id number] DOB: 1965/12/14   PCP: Georgann Housekeeper, MD  Patient is from: Home  DOA: 10/24/2019 LOS: 15  Brief Narrative / Interim history: 54 y.o.femalewith a history of HTN/HLD, anxiety/depression,seizure disorder, COPDand diverticulosis who presented on 10/24/19 with worsening abdominal pain. Pain going on since early July initially with vaginal discharge, seen by her gynecologist and had multiple evaluation and was on different antibiotics subsequently referred to GI. CT of the abdomen and pelvis on 10/23/2019 showed several abscesses within the abdomen coming from vagina/rectal region with concern for potential perforated appendicitis. 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 She was started on broad-spectrum antibiotics. GI and general surgery were consulted.  Flex sigmoidoscopy on 9/8 which showed diverticulosis in the rectosigmoid and sigmoid colon along with stenosis and narrowing.  GI recommended outpatient follow-up for colonoscopy.  Repeat CT abdomen and pelvis on 9/9 showed central loculated abscess within the pelvis which is increased in size and is not amenable to percutaneous drainage given its location.  She underwent diagnostic laparotomy with laparoscopic diverting loop ileostomy on 9/13.  MRI pelvis on 9/14 redemonstrated abscess or fistula cavity in the central pelvis containing air and fluid interspersed between the mid sigmoid colon and rectum measuring 3.7 x 3.1 cm suspicious for abscess or fistula.  General surgery following.   Subjective: Seen and examined earlier this morning.  No major events overnight of this morning.  She had an episode of shortness of breath that she attributes to anxiety.  She is on Ativan 1 mg daily as needed.  Denies shortness of breath now.  However, she is tearful about her overall situation. She is worried that she might not get better.   Objective: Vitals:    11/07/19 0526 11/07/19 1423 11/07/19 2137 11/08/19 0456  BP: 134/88 128/85 (!) 142/88 (!) 150/96  Pulse: 70 71 73 69  Resp: 16 (!) 24 18 17   Temp: 98.4 F (36.9 C) 98.2 F (36.8 C) 98.5 F (36.9 C) 98.1 F (36.7 C)  TempSrc: Oral Oral Oral Oral  SpO2: 97% 98% 98% 99%  Weight:      Height:        Intake/Output Summary (Last 24 hours) at 11/08/2019 1316 Last data filed at 11/08/2019 0700 Gross per 24 hour  Intake 2734.83 ml  Output 250 ml  Net 2484.83 ml   Filed Weights   10/24/19 0751  Weight: 78.5 kg    Examination:  GENERAL: No apparent distress.  Nontoxic. HEENT: MMM.  Vision and hearing grossly intact.  NECK: Supple.  No apparent JVD.  RESP: On room air.  No IWOB.  Fair aeration bilaterally. CVS:  RRR. Heart sounds normal.  ABD/GI/GU: BS+. Abd soft.  Slight tenderness to palpation.  Ileostomy. MSK/EXT:  Moves extremities. No apparent deformity. No edema.  SKIN: no apparent skin lesion or wound NEURO: Awake, alert and oriented appropriately.  No apparent focal neuro deficit. PSYCH: Anxious and tearful.  Procedures:  9/8-Flex sigmoidoscopy revealed diverticulosis in the rectosigmoid and sigmoid colon along with the stenosis and narrowing.  Biopsies from colon and rectum negative for inflammation or malignancy 9/13-diagnostic laparotomy with laparoscopic diverting loop ileostomy  Microbiology summarized: 9/2-COVID-19 PCR negative. 9/2-blood cultures negative.  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 -Abscess reportedly not amenable to IR drainage given its location. -CT abdomen and pelvis and MRI pelvis as above -S/p laparotomy with laparoscopic diverting loop ileostomy as above -Continue  ostomy care and IV Zosyn.  Appreciate WOCN -Pain control-on scheduled Tylenol and Robaxin, as needed gabapentin, oxycodone and IV morphine -General surgery following.   Essential hypertension: Normotensive off home medications (losartan and metoprolol). -Increase metoprolol to home dose.  Macrocytic anemia: H&H stable.  Anemia panel within normal. -Monitor  Chronic COPD: Stable. -Continue as needed albuterol  Tobacco use disorder: -Encouraged tobacco cessation -Continue nicotine patch  Hypokalemia/hypomagnesemia: resolved. -Monitor and replenish as appropriate  History of seizure disorder: Stable.  He does not seem to be on medication at home.  Anxiety and depression: Somewhat anxious and tearful today. -Change Ativan to 0.5 mg every 6 hours as needed instead of 1 mg daily as needed.  Leukocytosis: Likely due to #1.  Resolved.   Body mass index is 26.31 kg/m.         DVT prophylaxis:  enoxaparin (LOVENOX) injection 40 mg Start: 10/24/19 2200 SCDs Start: 10/24/19 1820  Code Status: Full code Family Communication: Patient and/or RN. Available if any question. Status is: Inpatient  Remains inpatient appropriate because:IV treatments appropriate due to intensity of illness or inability to take PO and Inpatient level of care appropriate due to severity of illness   Dispo: The patient is from: Home              Anticipated d/c is to: Home              Anticipated d/c date is: > 3 days              Patient currently is not medically stable to d/c.       Consultants:  GI-signed off General surgery   Sch Meds:  Scheduled Meds: . acetaminophen  1,000 mg Oral Q6H  . aspirin EC  81 mg Oral Daily  . atorvastatin  10 mg Oral QPM  . calcium-vitamin D  1 tablet Oral QHS  . enoxaparin (LOVENOX) injection  40 mg Subcutaneous Q24H  . feeding supplement  1 Container Oral TID BM  . ibuprofen  600 mg Oral TID  . loratadine  10 mg Oral Daily  . methocarbamol  1,000 mg Oral TID  . metoprolol tartrate  12.5 mg Oral BID  . multivitamin with minerals  1 tablet Oral QHS  . nicotine  7 mg Transdermal Daily  . psyllium  1 packet Oral Daily   . sodium chloride flush  10-40 mL Intracatheter Q12H   Continuous Infusions: . sodium chloride 10 mL/hr at 11/07/19 0600  . lactated ringers    . lactated ringers 75 mL/hr at 11/08/19 0459  . piperacillin-tazobactam (ZOSYN)  IV 3.375 g (11/08/19 1103)   PRN Meds:.sodium chloride, albuterol, gabapentin, LORazepam, morphine injection, ondansetron **OR** ondansetron (ZOFRAN) IV, oxyCODONE, sodium chloride flush  Antimicrobials: Anti-infectives (From admission, onward)   Start     Dose/Rate Route Frequency Ordered Stop   11/03/19 0845  cefoTEtan (CEFOTAN) 2 g in sodium chloride 0.9 % 100 mL IVPB  Status:  Discontinued        2 g 200 mL/hr over 30 Minutes Intravenous On call to O.R. 11/03/19 0831 11/03/19 0842   10/25/19 1000  vancomycin (VANCOCIN) IVPB 1000 mg/200 mL premix  Status:  Discontinued        1,000 mg 200 mL/hr over 60 Minutes Intravenous Every 12 hours 10/24/19 1835 10/28/19 1727   10/24/19 2300  piperacillin-tazobactam (ZOSYN) IVPB 3.375 g        3.375 g 12.5 mL/hr over 240 Minutes Intravenous Every 8 hours 10/24/19 1835  10/24/19 2000  vancomycin (VANCOREADY) IVPB 1500 mg/300 mL  Status:  Discontinued        1,500 mg 150 mL/hr over 120 Minutes Intravenous  Once 10/24/19 1835 10/24/19 1836   10/24/19 2000  vancomycin (VANCOREADY) IVPB 1500 mg/300 mL        1,500 mg 150 mL/hr over 120 Minutes Intravenous  Once 10/24/19 1836 10/24/19 2229   10/24/19 1730  piperacillin-tazobactam (ZOSYN) IVPB 3.375 g        3.375 g 100 mL/hr over 30 Minutes Intravenous  Once 10/24/19 1721 10/24/19 1810       I have personally reviewed the following labs and images: CBC: Recent Labs  Lab 11/02/19 0345 11/04/19 0855 11/05/19 0335 11/06/19 0349 11/07/19 0418  WBC 5.7 5.4 5.6 11.3* 7.4  NEUTROABS 2.7  --  3.8 6.2  --   HGB 7.9* 8.8* 8.2* 10.9* 9.1*  HCT 25.6* 28.5* 26.9* 35.8* 29.7*  MCV 102.4* 100.7* 101.5* 100.8* 101.7*  PLT 283 322 338 609* 389   BMP &GFR Recent Labs  Lab  11/02/19 0345 11/03/19 0327 11/04/19 0311 11/05/19 0335 11/06/19 0349 11/07/19 0418 11/08/19 0240  NA 139  --   --  136 142 139  --   K 3.4*  --   --  4.0 3.1* 3.9  --   CL 105  --   --  102 105 105  --   CO2 25  --   --  24 25 26   --   GLUCOSE 104*  --   --  135* 114* 105*  --   BUN 6  --   --  8 8 <5*  --   CREATININE 0.64   < > 0.58 0.56 0.61 0.67  0.66 0.77  CALCIUM 8.5*  --   --  8.7* 8.8* 8.4*  --   MG 1.7  --   --  1.7 1.6* 1.7  --   PHOS  --   --   --   --   --  4.0  --    < > = values in this interval not displayed.   Estimated Creatinine Clearance: 86.4 mL/min (by C-G formula based on SCr of 0.77 mg/dL). Liver & Pancreas: Recent Labs  Lab 11/07/19 0418  ALBUMIN 2.3*   No results for input(s): LIPASE, AMYLASE in the last 168 hours. No results for input(s): AMMONIA in the last 168 hours. Diabetic: No results for input(s): HGBA1C in the last 72 hours. No results for input(s): GLUCAP in the last 168 hours. Cardiac Enzymes: No results for input(s): CKTOTAL, CKMB, CKMBINDEX, TROPONINI in the last 168 hours. No results for input(s): PROBNP in the last 8760 hours. Coagulation Profile: No results for input(s): INR, PROTIME in the last 168 hours. Thyroid Function Tests: No results for input(s): TSH, T4TOTAL, FREET4, T3FREE, THYROIDAB in the last 72 hours. Lipid Profile: No results for input(s): CHOL, HDL, LDLCALC, TRIG, CHOLHDL, LDLDIRECT in the last 72 hours. Anemia Panel: No results for input(s): VITAMINB12, FOLATE, FERRITIN, TIBC, IRON, RETICCTPCT in the last 72 hours. Urine analysis:    Component Value Date/Time   COLORURINE YELLOW 10/24/2019 1822   APPEARANCEUR TURBID (A) 10/24/2019 1822   LABSPEC 1.013 10/24/2019 1822   PHURINE 5.0 10/24/2019 1822   GLUCOSEU NEGATIVE 10/24/2019 1822   HGBUR MODERATE (A) 10/24/2019 1822   BILIRUBINUR NEGATIVE 10/24/2019 1822   KETONESUR 5 (A) 10/24/2019 1822   PROTEINUR 100 (A) 10/24/2019 1822   UROBILINOGEN 0.2 08/23/2006  1144   NITRITE NEGATIVE  10/24/2019 1822   LEUKOCYTESUR LARGE (A) 10/24/2019 1822   Sepsis Labs: Invalid input(s): PROCALCITONIN, LACTICIDVEN  Microbiology: Recent Results (from the past 240 hour(s))  Surgical PCR screen     Status: None   Collection Time: 11/03/19 11:23 PM   Specimen: Nasal Mucosa; Nasal Swab  Result Value Ref Range Status   MRSA, PCR NEGATIVE NEGATIVE Final   Staphylococcus aureus NEGATIVE NEGATIVE Final    Comment: (NOTE) The Xpert SA Assay (FDA approved for NASAL specimens in patients 75 years of age and older), is one component of a comprehensive surveillance program. It is not intended to diagnose infection nor to guide or monitor treatment. Performed at [facility], [address] [place], [place] [address]     Radiology Studies: No results found.    Taye T. Gonfa [facility]  If 7PM-7AM, please contact night-coverage [email or web address] 11/08/2019, 1:16 PM

## 2019-11-08 NOTE — Progress Notes (Signed)
Subjective No acute events. Doing well. Denies n/v. Tolerating diet. Ostomy working - had rod removed yesterday.  Objective: Vital signs in last 24 hours: Temp:  [98.1 F (36.7 C)-98.5 F (36.9 C)] 98.1 F (36.7 C) (09/17 0456) Pulse Rate:  [69-73] 69 (09/17 0456) Resp:  [17-24] 17 (09/17 0456) BP: (128-150)/(85-96) 150/96 (09/17 0456) SpO2:  [98 %-99 %] 99 % (09/17 0456) Last BM Date: 11/08/19  Intake/Output from previous day: 09/16 0701 - 09/17 0700 In: 2824.8 [P.O.:770; I.V.:1904.8; IV Piggyback:150] Out: 250 [Stool:250] Intake/Output this shift: No intake/output data recorded.  Gen: NAD, comfortable CV: RRR Pulm: Normal work of breathing Abd: Soft, mildly ttp in lower abdomen. Ileostomy pink and productive with liquid bilious stool (250 cc/24h documented) Incisions c/d/i  Ext: SCDs in place  Lab Results: CBC  Recent Labs    11/06/19 0349 11/07/19 0418  WBC 11.3* 7.4  HGB 10.9* 9.1*  HCT 35.8* 29.7*  PLT 609* 389   BMET Recent Labs    11/06/19 0349 11/06/19 0349 11/07/19 0418 11/08/19 0240  NA 142  --  139  --   K 3.1*  --  3.9  --   CL 105  --  105  --   CO2 25  --  26  --   GLUCOSE 114*  --  105*  --   BUN 8  --  <5*  --   CREATININE 0.61   < > 0.67  0.66 0.77  CALCIUM 8.8*  --  8.4*  --    < > = values in this interval not displayed.   PT/INR No results for input(s): LABPROT, INR in the last 72 hours. ABG No results for input(s): PHART, HCO3 in the last 72 hours.  Invalid input(s): PCO2, PO2  Studies/Results:  Anti-infectives: Anti-infectives (From admission, onward)   Start     Dose/Rate Route Frequency Ordered Stop   11/03/19 0845  cefoTEtan (CEFOTAN) 2 g in sodium chloride 0.9 % 100 mL IVPB  Status:  Discontinued        2 g 200 mL/hr over 30 Minutes Intravenous On call to O.R. 11/03/19 0831 11/03/19 0842   10/25/19 1000  vancomycin (VANCOCIN) IVPB 1000 mg/200 mL premix  Status:  Discontinued        1,000 mg 200 mL/hr over 60 Minutes  Intravenous Every 12 hours 10/24/19 1835 10/28/19 1727   10/24/19 2300  piperacillin-tazobactam (ZOSYN) IVPB 3.375 g        3.375 g 12.5 mL/hr over 240 Minutes Intravenous Every 8 hours 10/24/19 1835     10/24/19 2000  vancomycin (VANCOREADY) IVPB 1500 mg/300 mL  Status:  Discontinued        1,500 mg 150 mL/hr over 120 Minutes Intravenous  Once 10/24/19 1835 10/24/19 1836   10/24/19 2000  vancomycin (VANCOREADY) IVPB 1500 mg/300 mL        1,500 mg 150 mL/hr over 120 Minutes Intravenous  Once 10/24/19 1836 10/24/19 2229   10/24/19 1730  piperacillin-tazobactam (ZOSYN) IVPB 3.375 g        3.375 g 100 mL/hr over 30 Minutes Intravenous  Once 10/24/19 1721 10/24/19 1810       Assessment/Plan: Patient Active Problem List   Diagnosis Date Noted  . Intra-abdominal abscess (HCC) 10/24/2019  . Alcoholic ketoacidosis 08/13/2018  . Hyponatremia 08/13/2018  . Hypokalemia 08/13/2018  . Hyponatremia with normal extracellular fluid volume 08/10/2018  . Seizure (HCC) 11/04/2016  . Seizures (HCC) 11/03/2016  . COPD (chronic obstructive pulmonary disease) (HCC) 11/03/2016  .  Alcohol abuse 11/03/2016  . Cocaine use 11/03/2016  . Shortness of breath-with exertion 09/23/2013  . Occlusion and stenosis of carotid artery without mention of cerebral infarction 08/22/2011  . Depression   . Anxiety   . Arthritis    s/p Procedure(s): DIAGNOSTIC LAPAROTOMY WITH LAPAROSCOPIC diverting loop ileostomy 11/04/2019  -Diet as tolerated -Monitor output -Continue IV abx for now - hopefully in the coming days everything cools down -Ambulate 5x/day   LOS: 15 days   Marin Olp, M.D. [facility] Use [email or web address] to contact on call provider

## 2019-11-09 LAB — CBC
HCT: 28.2 % — ABNORMAL LOW (ref 36.0–46.0)
Hemoglobin: 8.6 g/dL — ABNORMAL LOW (ref 12.0–15.0)
MCH: 30.7 pg (ref 26.0–34.0)
MCHC: 30.5 g/dL (ref 30.0–36.0)
MCV: 100.7 fL — ABNORMAL HIGH (ref 80.0–100.0)
Platelets: 398 10*3/uL (ref 150–400)
RBC: 2.8 MIL/uL — ABNORMAL LOW (ref 3.87–5.11)
RDW: 15.1 % (ref 11.5–15.5)
WBC: 7.5 10*3/uL (ref 4.0–10.5)
nRBC: 0 % (ref 0.0–0.2)

## 2019-11-09 LAB — RENAL FUNCTION PANEL
Albumin: 2.5 g/dL — ABNORMAL LOW (ref 3.5–5.0)
Anion gap: 9 (ref 5–15)
BUN: 9 mg/dL (ref 6–20)
CO2: 24 mmol/L (ref 22–32)
Calcium: 8.8 mg/dL — ABNORMAL LOW (ref 8.9–10.3)
Chloride: 106 mmol/L (ref 98–111)
Creatinine, Ser: 0.82 mg/dL (ref 0.44–1.00)
GFR calc Af Amer: >60 mL/min (ref >60)
GFR calc non Af Amer: >60 mL/min (ref >60)
Glucose, Bld: 92 mg/dL (ref 70–99)
Phosphorus: 5.2 mg/dL — ABNORMAL HIGH (ref 2.5–4.6)
Potassium: 3.6 mmol/L (ref 3.5–5.1)
Sodium: 139 mmol/L (ref 135–145)

## 2019-11-09 LAB — CREATININE, SERUM
Creatinine, Ser: 0.88 mg/dL (ref 0.44–1.00)
GFR calc Af Amer: >60 mL/min (ref >60)
GFR calc non Af Amer: >60 mL/min (ref >60)

## 2019-11-09 LAB — MAGNESIUM: Magnesium: 1.6 mg/dL — ABNORMAL LOW (ref 1.7–2.4)

## 2019-11-09 MED ORDER — POTASSIUM CHLORIDE CRYS ER 20 MEQ PO TBCR
40.0000 meq | EXTENDED_RELEASE_TABLET | Freq: Once | ORAL | Status: AC
  Administered 2019-11-09: 40 meq via ORAL
  Filled 2019-11-09: qty 2

## 2019-11-09 MED ORDER — MAGNESIUM SULFATE 2 GM/50ML IV SOLN
2.0000 g | Freq: Once | INTRAVENOUS | Status: AC
  Administered 2019-11-09: 2 g via INTRAVENOUS
  Filled 2019-11-09: qty 50

## 2019-11-09 NOTE — Plan of Care (Signed)
  Problem: Health Behavior/Discharge Planning: Goal: Ability to manage health-related needs will improve Outcome: Progressing   Problem: Clinical Measurements: Goal: Ability to maintain clinical measurements within normal limits will improve Outcome: Progressing Goal: Will remain free from infection Outcome: Progressing Goal: Diagnostic test results will improve Outcome: Progressing Goal: Respiratory complications will improve Outcome: Progressing Goal: Cardiovascular complication will be avoided Outcome: Progressing   Problem: Nutrition: Goal: Adequate nutrition will be maintained Outcome: Progressing   Problem: Nutrition: Goal: Adequate nutrition will be maintained Outcome: Progressing   Problem: Pain Managment: Goal: General experience of comfort will improve Outcome: Progressing   Problem: Safety: Goal: Ability to remain free from injury will improve Outcome: Progressing   Problem: Clinical Measurements: Goal: Ability to maintain clinical measurements within normal limits will improve Outcome: Progressing Goal: Postoperative complications will be avoided or minimized Outcome: Progressing   Problem: Skin Integrity: Goal: Demonstration of wound healing without infection will improve Outcome: Progressing   Problem: Education: Goal: Knowledge of ostomy care will improve Outcome: Progressing Goal: Understanding of discharge needs will improve Outcome: Progressing   Problem: Bowel/Gastric/Urinary: Goal: Gastrointestinal status for postoperative course will improve Outcome: Progressing   Problem: Coping: Goal: Coping ability will improve Outcome: Progressing   Problem: Fluid Volume: Goal: Ability to achieve a balanced intake and output will improve Outcome: Progressing   Problem: Health Behavior/Discharge Planning: Goal: Ability to manage health-related needs will improve Outcome: Progressing   Problem: Nutrition: Goal: Will attain and maintain optimal  nutritional status will improve Outcome: Progressing   Problem: Clinical Measurements: Goal: Postoperative complications will be avoided or minimized Outcome: Progressing   Problem: Skin Integrity: Goal: Will show signs of wound healing Outcome: Progressing Goal: Risk for impaired skin integrity will decrease Outcome: Progressing

## 2019-11-09 NOTE — Progress Notes (Signed)
PROGRESS NOTE  Susan Todd LYY:[id number] DOB: [birth year]   PCP: Georgann Housekeeper, MD  Patient is from: Home  DOA: 10/24/2019 LOS: 16  Brief Narrative / Interim history: 54 y.o.femalewith a history of HTN/HLD, anxiety/depression,seizure disorder, COPDand diverticulosis who presented on 10/24/19 with worsening abdominal pain. Pain going on since early July initially with vaginal discharge, seen by her gynecologist and had multiple evaluation and was on different antibiotics subsequently referred to GI. CT of the abdomen and pelvis on 10/23/2019 showed several abscesses within the abdomen coming from vagina/rectal region with concern for potential perforated appendicitis. 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 She was started on broad-spectrum antibiotics. GI and general surgery were consulted.  Flex sigmoidoscopy on 9/8 which showed diverticulosis in the rectosigmoid and sigmoid colon along with stenosis and narrowing.  GI recommended outpatient follow-up for colonoscopy.  Repeat CT abdomen and pelvis on 9/9 showed central loculated abscess within the pelvis which is increased in size and is not amenable to percutaneous drainage given its location.  She underwent diagnostic laparotomy with laparoscopic diverting loop ileostomy on 9/13.  MRI pelvis on 9/14 redemonstrated abscess or fistula cavity in the central pelvis containing air and fluid interspersed between the mid sigmoid colon and rectum measuring 3.7 x 3.1 cm suspicious for abscess or fistula.  General surgery following.   Subjective: Seen and examined earlier this morning. No major events overnight. She says she woke up with severe burning abdominal pain this morning. No significant improvement after pain medication. Now the pain is more of aching. Felt worse with coughing. Denies nausea or vomiting. Denies chest pain or problem with her breathing. Anxiety seems to be improved after  adjusting Ativan.  Objective: Vitals:   11/08/19 1337 11/08/19 2057 11/09/19 0528 11/09/19 0930  BP: 130/76 127/78 (!) 145/90 (!) 149/96  Pulse: 71 69 68 71  Resp: 16 16 16 16   Temp: 98.8 F (37.1 C) 98 F (36.7 C) 98.5 F (36.9 C) 97.7 F (36.5 C)  TempSrc: Oral Oral Oral Oral  SpO2: 99% 98% 98% 98%  Weight:      Height:        Intake/Output Summary (Last 24 hours) at 11/09/2019 1228 Last data filed at 11/09/2019 0937 Gross per 24 hour  Intake 626.46 ml  Output 150 ml  Net 476.46 ml   Filed Weights   10/24/19 0751  Weight: 78.5 kg    Examination:  GENERAL: No apparent distress.  Nontoxic. HEENT: MMM.  Vision and hearing grossly intact.  NECK: Supple.  No apparent JVD.  RESP: On room air. No IWOB.  Fair aeration bilaterally. CVS:  RRR. Heart sounds normal.  ABD/GI/GU: BS+. Abd soft, NTND. Liquid stool in ileostomy bag. No blood. MSK/EXT:  Moves extremities. No apparent deformity. No edema.  SKIN: no apparent skin lesion or wound NEURO: Awake, alert and oriented appropriately.  No apparent focal neuro deficit. PSYCH: Calm. Normal affect.   Procedures:  9/8-Flex sigmoidoscopy revealed diverticulosis in the rectosigmoid and sigmoid colon along with the stenosis and narrowing.  Biopsies from colon and rectum negative for inflammation or malignancy 9/13-diagnostic laparotomy with laparoscopic diverting loop ileostomy  Microbiology summarized: 9/2-COVID-19 PCR negative. 9/2-blood cultures negative.  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 -Sepsis physiology resolved. -Abscess reportedly not amenable to IR drainage given its location. -CT abdomen and pelvis and MRI pelvis as above -S/p laparotomy with laparoscopic diverting loop ileostomy as above -  Continue ostomy care and IV Zosyn.  Appreciate WOCN -Pain control-on scheduled Tylenol and Robaxin, as  needed gabapentin, oxycodone and IV morphine -Encourage ambulation -General surgery following.  Essential hypertension: Normotensive off home medications (losartan and metoprolol). -Increase metoprolol to home dose.  Macrocytic anemia: Baseline Hgb 10-11 >Hgb 10.2 (admit)>> 8.6 (relatively stable). Anemia panel basically normal. -Continue monitoring  Chronic COPD: Stable. -Continue as needed albuterol  Tobacco use disorder: -Encouraged tobacco cessation -Continue nicotine patch  Hypokalemia/hypomagnesemia: K3.6. MG 1.6. -Replenish and recheck.  Hyperphosphatemia: P 5.2. -Continue monitoring  History of seizure disorder: Stable.  He does not seem to be on medication at home.  Anxiety and depression: Somewhat anxious and tearful today. -Continue Ativan to 0.5 mg every 6 hours as needed. On 1 mg daily as needed. -May consider switching to Klonopin to decrease withdrawal  Leukocytosis: Likely due to #1.  Resolved.   Body mass index is 26.31 kg/m.         DVT prophylaxis:  enoxaparin (LOVENOX) injection 40 mg Start: 10/24/19 2200 SCDs Start: 10/24/19 1820  Code Status: Full code Family Communication: Patient and/or RN. Available if any question. Status is: Inpatient  Remains inpatient appropriate because:IV treatments appropriate due to intensity of illness or inability to take PO and Inpatient level of care appropriate due to severity of illness   Dispo: The patient is from: Home              Anticipated d/c is to: Home              Anticipated d/c date is: > 3 days              Patient currently is not medically stable to d/c.       Consultants:  GI-signed off General surgery   Sch Meds:  Scheduled Meds: . acetaminophen  1,000 mg Oral Q6H  . aspirin EC  81 mg Oral Daily  . atorvastatin  10 mg Oral QPM  . calcium-vitamin D  1 tablet Oral QHS  . enoxaparin (LOVENOX) injection  40 mg Subcutaneous Q24H  . feeding supplement  1 Container Oral TID BM  .  ibuprofen  600 mg Oral TID  . loratadine  10 mg Oral Daily  . methocarbamol  1,000 mg Oral TID  . metoprolol tartrate  25 mg Oral BID  . multivitamin with minerals  1 tablet Oral QHS  . nicotine  7 mg Transdermal Daily  . psyllium  1 packet Oral Daily  . sodium chloride flush  10-40 mL Intracatheter Q12H   Continuous Infusions: . sodium chloride 10 mL/hr at 11/07/19 0600  . lactated ringers    . piperacillin-tazobactam (ZOSYN)  IV 3.375 g (11/09/19 0937)   PRN Meds:.sodium chloride, albuterol, gabapentin, LORazepam, morphine injection, ondansetron **OR** ondansetron (ZOFRAN) IV, oxyCODONE, sodium chloride flush  Antimicrobials: Anti-infectives (From admission, onward)   Start     Dose/Rate Route Frequency Ordered Stop   11/03/19 0845  cefoTEtan (CEFOTAN) 2 g in sodium chloride 0.9 % 100 mL IVPB  Status:  Discontinued        2 g 200 mL/hr over 30 Minutes Intravenous On call to O.R. 11/03/19 0831 11/03/19 0842   10/25/19 1000  vancomycin (VANCOCIN) IVPB 1000 mg/200 mL premix  Status:  Discontinued        1,000 mg 200 mL/hr over 60 Minutes Intravenous Every 12 hours 10/24/19 1835 10/28/19 1727   10/24/19 2300  piperacillin-tazobactam (ZOSYN) IVPB 3.375 g  3.375 g 12.5 mL/hr over 240 Minutes Intravenous Every 8 hours 10/24/19 1835     10/24/19 2000  vancomycin (VANCOREADY) IVPB 1500 mg/300 mL  Status:  Discontinued        1,500 mg 150 mL/hr over 120 Minutes Intravenous  Once 10/24/19 1835 10/24/19 1836   10/24/19 2000  vancomycin (VANCOREADY) IVPB 1500 mg/300 mL        1,500 mg 150 mL/hr over 120 Minutes Intravenous  Once 10/24/19 1836 10/24/19 2229   10/24/19 1730  piperacillin-tazobactam (ZOSYN) IVPB 3.375 g        3.375 g 100 mL/hr over 30 Minutes Intravenous  Once 10/24/19 1721 10/24/19 1810       I have personally reviewed the following labs and images: CBC: Recent Labs  Lab 11/04/19 0855 11/05/19 0335 11/06/19 0349 11/07/19 0418 11/09/19 0434  WBC 5.4 5.6  11.3* 7.4 7.5  NEUTROABS  --  3.8 6.2  --   --   HGB 8.8* 8.2* 10.9* 9.1* 8.6*  HCT 28.5* 26.9* 35.8* 29.7* 28.2*  MCV 100.7* 101.5* 100.8* 101.7* 100.7*  PLT 322 338 609* 389 398   BMP &GFR Recent Labs  Lab 11/05/19 0335 11/06/19 0349 11/07/19 0418 11/08/19 0240 11/09/19 0434  NA 136 142 139  --  139  K 4.0 3.1* 3.9  --  3.6  CL 102 105 105  --  106  CO2 24 25 26   --  24  GLUCOSE 135* 114* 105*  --  92  BUN 8 8 <5*  --  9  CREATININE 0.56 0.61 0.67  0.66 0.77 0.82  0.88  CALCIUM 8.7* 8.8* 8.4*  --  8.8*  MG 1.7 1.6* 1.7  --  1.6*  PHOS  --   --  4.0  --  5.2*   Estimated Creatinine Clearance: 78.5 mL/min (by C-G formula based on SCr of 0.88 mg/dL). Liver & Pancreas: Recent Labs  Lab 11/07/19 0418 11/09/19 0434  ALBUMIN 2.3* 2.5*   No results for input(s): LIPASE, AMYLASE in the last 168 hours. No results for input(s): AMMONIA in the last 168 hours. Diabetic: No results for input(s): HGBA1C in the last 72 hours. No results for input(s): GLUCAP in the last 168 hours. Cardiac Enzymes: No results for input(s): CKTOTAL, CKMB, CKMBINDEX, TROPONINI in the last 168 hours. No results for input(s): PROBNP in the last 8760 hours. Coagulation Profile: No results for input(s): INR, PROTIME in the last 168 hours. Thyroid Function Tests: No results for input(s): TSH, T4TOTAL, FREET4, T3FREE, THYROIDAB in the last 72 hours. Lipid Profile: No results for input(s): CHOL, HDL, LDLCALC, TRIG, CHOLHDL, LDLDIRECT in the last 72 hours. Anemia Panel: No results for input(s): VITAMINB12, FOLATE, FERRITIN, TIBC, IRON, RETICCTPCT in the last 72 hours. Urine analysis:    Component Value Date/Time   COLORURINE YELLOW 10/24/2019 1822   APPEARANCEUR TURBID (A) 10/24/2019 1822   LABSPEC 1.013 10/24/2019 1822   PHURINE 5.0 10/24/2019 1822   GLUCOSEU NEGATIVE 10/24/2019 1822   HGBUR MODERATE (A) 10/24/2019 1822   BILIRUBINUR NEGATIVE 10/24/2019 1822   KETONESUR 5 (A) 10/24/2019 1822    PROTEINUR 100 (A) 10/24/2019 1822   UROBILINOGEN 0.2 08/23/2006 1144   NITRITE NEGATIVE 10/24/2019 1822   LEUKOCYTESUR LARGE (A) 10/24/2019 1822   Sepsis Labs: Invalid input(s): PROCALCITONIN, LACTICIDVEN  Microbiology: Recent Results (from the past 240 hour(s))  Surgical PCR screen     Status: None   Collection Time: 11/03/19 11:23 PM   Specimen: Nasal Mucosa; Nasal Swab  Result  Value Ref Range Status   MRSA, PCR NEGATIVE NEGATIVE Final   Staphylococcus aureus NEGATIVE NEGATIVE Final    Comment: (NOTE) The Xpert SA Assay (FDA approved for NASAL specimens in patients 56 years of age and older), is one component of a comprehensive surveillance program. It is not intended to diagnose infection nor to guide or monitor treatment. Performed at [facility], [address] [place], [place] [id number]     Radiology Studies: No results found.    Gillie Crisci T. Ianmichael [facility]  If 7PM-7AM, please contact night-coverage [email or web address] 11/09/2019, 12:28 PM

## 2019-11-09 NOTE — Progress Notes (Signed)
5 Days Post-Op   Subjective/Chief Complaint:  No major complaints.  Sore around ostomy site  Objective: Vital signs in last 24 hours: Temp:  [98 F (36.7 C)-98.8 F (37.1 C)] 98.5 F (36.9 C) (09/18 0528) Pulse Rate:  [68-71] 68 (09/18 0528) Resp:  [16] 16 (09/18 0528) BP: (127-145)/(76-90) 145/90 (09/18 0528) SpO2:  [98 %-99 %] 98 % (09/18 0528) Last BM Date: 11/09/19  Intake/Output from previous day: 09/17 0701 - 09/18 0700 In: 506.5 [P.O.:360; IV Piggyback:146.5] Out: 150 [Stool:150] Intake/Output this shift: No intake/output data recorded.   Gen: NAD, comfortable CV: RRR Pulm: Normal work of breathing Abd: Soft, mildly ttp in lower abdomen. Ileostomy pink and productive with liquid bilious stool  Incisions c/d/i  Ext: SCDs in place Lab Results:  Recent Labs    11/07/19 0418 11/09/19 0434  WBC 7.4 7.5  HGB 9.1* 8.6*  HCT 29.7* 28.2*  PLT 389 398   BMET Recent Labs    11/07/19 0418 11/07/19 0418 11/08/19 0240 11/09/19 0434  NA 139  --   --  139  K 3.9  --   --  3.6  CL 105  --   --  106  CO2 26  --   --  24  GLUCOSE 105*  --   --  92  BUN <5*  --   --  9  CREATININE 0.67  0.66   < > 0.77 0.82  0.88  CALCIUM 8.4*  --   --  8.8*   < > = values in this interval not displayed.   PT/INR No results for input(s): LABPROT, INR in the last 72 hours. ABG No results for input(s): PHART, HCO3 in the last 72 hours.  Invalid input(s): PCO2, PO2  Studies/Results: No results found.  Anti-infectives: Anti-infectives (From admission, onward)   Start     Dose/Rate Route Frequency Ordered Stop   11/03/19 0845  cefoTEtan (CEFOTAN) 2 g in sodium chloride 0.9 % 100 mL IVPB  Status:  Discontinued        2 g 200 mL/hr over 30 Minutes Intravenous On call to O.R. 11/03/19 0831 11/03/19 0842   10/25/19 1000  vancomycin (VANCOCIN) IVPB 1000 mg/200 mL premix  Status:  Discontinued        1,000 mg 200 mL/hr over 60 Minutes Intravenous Every 12 hours 10/24/19 1835  10/28/19 1727   10/24/19 2300  piperacillin-tazobactam (ZOSYN) IVPB 3.375 g        3.375 g 12.5 mL/hr over 240 Minutes Intravenous Every 8 hours 10/24/19 1835     10/24/19 2000  vancomycin (VANCOREADY) IVPB 1500 mg/300 mL  Status:  Discontinued        1,500 mg 150 mL/hr over 120 Minutes Intravenous  Once 10/24/19 1835 10/24/19 1836   10/24/19 2000  vancomycin (VANCOREADY) IVPB 1500 mg/300 mL        1,500 mg 150 mL/hr over 120 Minutes Intravenous  Once 10/24/19 1836 10/24/19 2229   10/24/19 1730  piperacillin-tazobactam (ZOSYN) IVPB 3.375 g        3.375 g 100 mL/hr over 30 Minutes Intravenous  Once 10/24/19 1721 10/24/19 1810      Assessment/Plan: s/p Procedure(s): DIAGNOSTIC LAPAROTOMY WITH LAPAROSCOPIC diverting loop ileostomy (N/A) Diet as tolerated -Monitor output -Continue IV abx for now - hopefully in the coming days everything cools down -Ambulate 5x/day  LOS: 16 days    Susan Todd A Alex Mcmanigal md 11/09/2019

## 2019-11-10 DIAGNOSIS — K219 Gastro-esophageal reflux disease without esophagitis: Secondary | ICD-10-CM

## 2019-11-10 LAB — CREATININE, SERUM
Creatinine, Ser: 0.85 mg/dL (ref 0.44–1.00)
GFR calc Af Amer: >60 mL/min (ref >60)
GFR calc non Af Amer: >60 mL/min (ref >60)

## 2019-11-10 MED ORDER — AMLODIPINE BESYLATE 5 MG PO TABS
5.0000 mg | ORAL_TABLET | Freq: Every day | ORAL | Status: DC
  Administered 2019-11-10 – 2019-11-13 (×4): 5 mg via ORAL
  Filled 2019-11-10 (×4): qty 1

## 2019-11-10 MED ORDER — ALUM & MAG HYDROXIDE-SIMETH 200-200-20 MG/5ML PO SUSP
30.0000 mL | Freq: Four times a day (QID) | ORAL | Status: DC | PRN
  Administered 2019-11-10 – 2019-11-13 (×6): 30 mL via ORAL
  Filled 2019-11-10 (×6): qty 30

## 2019-11-10 MED ORDER — PROMETHAZINE HCL 25 MG/ML IJ SOLN
12.5000 mg | Freq: Three times a day (TID) | INTRAMUSCULAR | Status: DC | PRN
  Administered 2019-11-10 – 2019-11-13 (×6): 12.5 mg via INTRAVENOUS
  Filled 2019-11-10 (×6): qty 1

## 2019-11-10 MED ORDER — PANTOPRAZOLE SODIUM 40 MG PO TBEC
40.0000 mg | DELAYED_RELEASE_TABLET | Freq: Every day | ORAL | Status: DC
  Administered 2019-11-10 – 2019-11-11 (×2): 40 mg via ORAL
  Filled 2019-11-10 (×2): qty 1

## 2019-11-10 NOTE — Progress Notes (Signed)
6 Days Post-Op   Subjective/Chief Complaint: Complains of bloating   Objective: Vital signs in last 24 hours: Temp:  [98.1 F (36.7 C)-98.7 F (37.1 C)] 98.5 F (36.9 C) (09/19 0601) Pulse Rate:  [65-66] 66 (09/19 0601) Resp:  [17-18] 17 (09/19 0601) BP: (135-159)/(69-76) 159/76 (09/19 0601) SpO2:  [97 %-100 %] 100 % (09/19 0601) Last BM Date: 11/09/19  Intake/Output from previous day: 09/18 0701 - 09/19 0700 In: 380.9 [P.O.:120; I.V.:120; IV Piggyback:140.9] Out: 600 [Stool:600] Intake/Output this shift: No intake/output data recorded.  General appearance: alert and cooperative Resp: clear to auscultation bilaterally Cardio: regular rate and rhythm GI: soft, distended. mild tenderness. ostomy pink and productive  Lab Results:  Recent Labs    11/09/19 0434  WBC 7.5  HGB 8.6*  HCT 28.2*  PLT 398   BMET Recent Labs    11/09/19 0434 11/10/19 0320  NA 139  --   K 3.6  --   CL 106  --   CO2 24  --   GLUCOSE 92  --   BUN 9  --   CREATININE 0.82  0.88 0.85  CALCIUM 8.8*  --    PT/INR No results for input(s): LABPROT, INR in the last 72 hours. ABG No results for input(s): PHART, HCO3 in the last 72 hours.  Invalid input(s): PCO2, PO2  Studies/Results: No results found.  Anti-infectives: Anti-infectives (From admission, onward)   Start     Dose/Rate Route Frequency Ordered Stop   11/03/19 0845  cefoTEtan (CEFOTAN) 2 g in sodium chloride 0.9 % 100 mL IVPB  Status:  Discontinued        2 g 200 mL/hr over 30 Minutes Intravenous On call to O.R. 11/03/19 0831 11/03/19 0842   10/25/19 1000  vancomycin (VANCOCIN) IVPB 1000 mg/200 mL premix  Status:  Discontinued        1,000 mg 200 mL/hr over 60 Minutes Intravenous Every 12 hours 10/24/19 1835 10/28/19 1727   10/24/19 2300  piperacillin-tazobactam (ZOSYN) IVPB 3.375 g        3.375 g 12.5 mL/hr over 240 Minutes Intravenous Every 8 hours 10/24/19 1835     10/24/19 2000  vancomycin (VANCOREADY) IVPB 1500  mg/300 mL  Status:  Discontinued        1,500 mg 150 mL/hr over 120 Minutes Intravenous  Once 10/24/19 1835 10/24/19 1836   10/24/19 2000  vancomycin (VANCOREADY) IVPB 1500 mg/300 mL        1,500 mg 150 mL/hr over 120 Minutes Intravenous  Once 10/24/19 1836 10/24/19 2229   10/24/19 1730  piperacillin-tazobactam (ZOSYN) IVPB 3.375 g        3.375 g 100 mL/hr over 30 Minutes Intravenous  Once 10/24/19 1721 10/24/19 1810      Assessment/Plan: s/p Procedure(s): DIAGNOSTIC LAPAROTOMY WITH LAPAROSCOPIC diverting loop ileostomy (N/A) will go back to clears for now given how distended she is  Continue IV abx Pod 6   LOS: 17 days    Susan Todd 11/10/2019

## 2019-11-10 NOTE — Progress Notes (Signed)
PROGRESS NOTE  Susan Todd BHA:[id number] DOB: [birth year]   PCP: Georgann Housekeeper, MD  Patient is from: Home  DOA: 10/24/2019 LOS: 17  Brief Narrative / Interim history: 54 y.o.femalewith a history of HTN/HLD, anxiety/depression,seizure disorder, COPDand diverticulosis who presented on 10/24/19 with worsening abdominal pain. Pain going on since early July initially with vaginal discharge, seen by her gynecologist and had multiple evaluation and was on different antibiotics subsequently referred to GI. CT of the abdomen and pelvis on 10/23/2019 showed several abscesses within the abdomen coming from vagina/rectal region with concern for potential perforated appendicitis. 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 She was started on broad-spectrum antibiotics. GI and general surgery were consulted.  Flex sigmoidoscopy on 9/8 which showed diverticulosis in the rectosigmoid and sigmoid colon along with stenosis and narrowing.  GI recommended outpatient follow-up for colonoscopy.  Repeat CT abdomen and pelvis on 9/9 showed central loculated abscess within the pelvis which is increased in size and is not amenable to percutaneous drainage given its location.  She underwent diagnostic laparotomy with laparoscopic diverting loop ileostomy on 9/13.  MRI pelvis on 9/14 redemonstrated abscess or fistula cavity in the central pelvis containing air and fluid interspersed between the mid sigmoid colon and rectum measuring 3.7 x 3.1 cm suspicious for abscess or fistula.  General surgery following.   Subjective: Seen and examined earlier this morning.  No major events overnight.  Complains very bad heartburn, reflux, bloating and an episode of emesis earlier this morning.  Emesis was nonbloody.  Continues to endorse significant pain.  She rates her pain 9/10.  Objective: Vitals:   11/09/19 0930 11/09/19 1533 11/09/19 2129 11/10/19 0601  BP: (!) 149/96 135/69  (!) 146/74 (!) 159/76  Pulse: 71 66 65 66  Resp: 16 18 18 17   Temp: 97.7 F (36.5 C) 98.1 F (36.7 C) 98.7 F (37.1 C) 98.5 F (36.9 C)  TempSrc: Oral Oral Oral Oral  SpO2: 98% 98% 97% 100%  Weight:      Height:        Intake/Output Summary (Last 24 hours) at 11/10/2019 1231 Last data filed at 11/10/2019 1225 Gross per 24 hour  Intake 363.74 ml  Output 650 ml  Net -286.26 ml   Filed Weights   10/24/19 0751  Weight: 78.5 kg    Examination:  GENERAL: No apparent distress.  Nontoxic. HEENT: MMM.  Vision and hearing grossly intact.  NECK: Supple.  No apparent JVD.  RESP: On room air.  No IWOB.  Fair aeration bilaterally. CVS:  RRR. Heart sounds normal.  ABD/GI/GU: BS+.  Slightly distended.  Tenderness around ileostomy.  Liquid stool in ileostomy bag MSK/EXT:  Moves extremities. No apparent deformity. No edema.  SKIN: no apparent skin lesion or wound NEURO: Awake, alert and oriented appropriately.  No apparent focal neuro deficit. PSYCH: Calm. Normal affect.  Procedures:  9/8-Flex sigmoidoscopy revealed diverticulosis in the rectosigmoid and sigmoid colon along with the stenosis and narrowing.  Biopsies from colon and rectum negative for inflammation or malignancy 9/13-diagnostic laparotomy with laparoscopic diverting loop ileostomy  Microbiology summarized: 9/2-COVID-19 PCR negative. 9/2-blood cultures negative.  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  Abdominal pain: some distention and tenderness on exam.  Liquid stool from ostomy bag. -Abscess reportedly not amenable to IR drainage given its location. -CT abdomen and pelvis and MRI pelvis as above -S/p laparotomy with laparoscopic diverting loop ileostomy as above -Continue ostomy care and IV Zosyn.  Appreciate WOCN -Pain control-on scheduled Tylenol and Robaxin, as needed gabapentin, oxycodone and IV morphine -Encourage  ambulation -General surgery following-de-escalate the diet to clear liquid diet -P.o. Protonix 40 mg daily, and GI cocktail as needed -Discontinue ibuprofen  Essential hypertension: BP slightly elevated.  Could be due to pain.  On metoprolol and losartan at home. -Continue metoprolol 25 mg twice daily -Pain control.  May resume home losartan if BP remains elevated.  Macrocytic anemia: Baseline Hgb 10-11 >Hgb 10.2 (admit)>> 8.6 (relatively stable). Anemia panel basically normal. -Continue monitoring  Chronic COPD: Stable. -Continue as needed albuterol  Tobacco use disorder: -Encouraged tobacco cessation -Continue nicotine patch  Hypokalemia/hypomagnesemia: Replenished. -Monitor and replenish as appropriate  Hyperphosphatemia: P 5.2. -Continue monitoring  History of seizure disorder: Stable.  He does not seem to be on medication at home.  Anxiety and depression: Somewhat anxious and tearful today. -Continue Ativan to 0.5 mg every 6 hours as needed. On 1 mg daily as needed. -May consider switching to Klonopin to decrease withdrawal  Leukocytosis: Likely due to #1.  Resolved.  GERD -PPI and GI cocktail as above  Body mass index is 26.31 kg/m.         DVT prophylaxis:  enoxaparin (LOVENOX) injection 40 mg Start: 10/24/19 2200 SCDs Start: 10/24/19 1820  Code Status: Full code Family Communication: Patient and/or RN. Available if any question. Status is: Inpatient  Remains inpatient appropriate because:IV treatments appropriate due to intensity of illness or inability to take PO and Inpatient level of care appropriate due to severity of illness   Dispo: The patient is from: Home              Anticipated d/c is to: Home              Anticipated d/c date is: > 3 days              Patient currently is not medically stable to d/c.       Consultants:  GI-signed off General surgery   Sch Meds:  Scheduled Meds: . acetaminophen  1,000 mg Oral Q6H  . amLODipine   5 mg Oral Daily  . aspirin EC  81 mg Oral Daily  . atorvastatin  10 mg Oral QPM  . calcium-vitamin D  1 tablet Oral QHS  . enoxaparin (LOVENOX) injection  40 mg Subcutaneous Q24H  . feeding supplement  1 Container Oral TID BM  . loratadine  10 mg Oral Daily  . methocarbamol  1,000 mg Oral TID  . metoprolol tartrate  25 mg Oral BID  . multivitamin with minerals  1 tablet Oral QHS  . nicotine  7 mg Transdermal Daily  . pantoprazole  40 mg Oral Daily  . psyllium  1 packet Oral Daily  . sodium chloride flush  10-40 mL Intracatheter Q12H   Continuous Infusions: . sodium chloride Stopped (11/10/19 0205)  . lactated ringers    . piperacillin-tazobactam (ZOSYN)  IV 3.375 g (11/10/19 1048)   PRN Meds:.sodium chloride, albuterol, alum & mag hydroxide-simeth, gabapentin, LORazepam, morphine injection, ondansetron **OR** ondansetron (ZOFRAN) IV, oxyCODONE, promethazine, sodium chloride flush  Antimicrobials: Anti-infectives (From admission, onward)   Start     Dose/Rate Route Frequency Ordered Stop   11/03/19 0845  cefoTEtan (CEFOTAN) 2 g in sodium chloride 0.9 % 100 mL IVPB  Status:  Discontinued        2 g 200 mL/hr over 30 Minutes Intravenous On call to O.R. 11/03/19 0831 11/03/19 0842   10/25/19 1000  vancomycin (VANCOCIN) IVPB 1000 mg/200 mL premix  Status:  Discontinued        1,000 mg 200 mL/hr over 60 Minutes Intravenous Every 12 hours 10/24/19 1835 10/28/19 1727   10/24/19 2300  piperacillin-tazobactam (ZOSYN) IVPB 3.375 g        3.375 g 12.5 mL/hr over 240 Minutes Intravenous Every 8 hours 10/24/19 1835     10/24/19 2000  vancomycin (VANCOREADY) IVPB 1500 mg/300 mL  Status:  Discontinued        1,500 mg 150 mL/hr over 120 Minutes Intravenous  Once 10/24/19 1835 10/24/19 1836   10/24/19 2000  vancomycin (VANCOREADY) IVPB 1500 mg/300 mL        1,500 mg 150 mL/hr over 120 Minutes Intravenous  Once 10/24/19 1836 10/24/19 2229   10/24/19 1730  piperacillin-tazobactam (ZOSYN) IVPB  3.375 g        3.375 g 100 mL/hr over 30 Minutes Intravenous  Once 10/24/19 1721 10/24/19 1810       I have personally reviewed the following labs and images: CBC: Recent Labs  Lab 11/04/19 0855 11/05/19 0335 11/06/19 0349 11/07/19 0418 11/09/19 0434  WBC 5.4 5.6 11.3* 7.4 7.5  NEUTROABS  --  3.8 6.2  --   --   HGB 8.8* 8.2* 10.9* 9.1* 8.6*  HCT 28.5* 26.9* 35.8* 29.7* 28.2*  MCV 100.7* 101.5* 100.8* 101.7* 100.7*  PLT 322 338 609* 389 398   BMP &GFR Recent Labs  Lab 11/05/19 0335 11/05/19 0335 11/06/19 0349 11/07/19 0418 11/08/19 0240 11/09/19 0434 11/10/19 0320  NA 136  --  142 139  --  139  --   K 4.0  --  3.1* 3.9  --  3.6  --   CL 102  --  105 105  --  106  --   CO2 24  --  25 26  --  24  --   GLUCOSE 135*  --  114* 105*  --  92  --   BUN 8  --  8 <5*  --  9  --   CREATININE 0.56   < > 0.61 0.67  0.66 0.77 0.82  0.88 0.85  CALCIUM 8.7*  --  8.8* 8.4*  --  8.8*  --   MG 1.7  --  1.6* 1.7  --  1.6*  --   PHOS  --   --   --  4.0  --  5.2*  --    < > = values in this interval not displayed.   Estimated Creatinine Clearance: 81.3 mL/min (by C-G formula based on SCr of 0.85 mg/dL). Liver & Pancreas: Recent Labs  Lab 11/07/19 0418 11/09/19 0434  ALBUMIN 2.3* 2.5*   No results for input(s): LIPASE, AMYLASE in the last 168 hours. No results for input(s): AMMONIA in the last 168 hours. Diabetic: No results for input(s): HGBA1C in the last 72 hours. No results for input(s): GLUCAP in the last 168 hours. Cardiac Enzymes: No results for input(s): CKTOTAL, CKMB, CKMBINDEX, TROPONINI in the last 168 hours. No results for input(s): PROBNP in the last 8760 hours. Coagulation Profile: No results for input(s): INR, PROTIME in the last 168 hours. Thyroid Function Tests: No results for input(s): TSH, T4TOTAL, FREET4, T3FREE, THYROIDAB in the last 72 hours. Lipid Profile: No results for input(s): CHOL, HDL, LDLCALC, TRIG, CHOLHDL, LDLDIRECT in the last 72  hours. Anemia Panel: No results for input(s): VITAMINB12, FOLATE, FERRITIN, TIBC, IRON, RETICCTPCT in the last 72 hours. Urine analysis:  Component Value Date/Time   COLORURINE YELLOW 10/24/2019 1822   APPEARANCEUR TURBID (A) 10/24/2019 1822   LABSPEC 1.013 10/24/2019 1822   PHURINE 5.0 10/24/2019 1822   GLUCOSEU NEGATIVE 10/24/2019 1822   HGBUR MODERATE (A) 10/24/2019 1822   BILIRUBINUR NEGATIVE 10/24/2019 1822   KETONESUR 5 (A) 10/24/2019 1822   PROTEINUR 100 (A) 10/24/2019 1822   UROBILINOGEN 0.2 08/23/2006 1144   NITRITE NEGATIVE 10/24/2019 1822   LEUKOCYTESUR LARGE (A) 10/24/2019 1822   Sepsis Labs: Invalid input(s): PROCALCITONIN, LACTICIDVEN  Microbiology: Recent Results (from the past 240 hour(s))  Surgical PCR screen     Status: None   Collection Time: 11/03/19 11:23 PM   Specimen: Nasal Mucosa; Nasal Swab  Result Value Ref Range Status   MRSA, PCR NEGATIVE NEGATIVE Final   Staphylococcus aureus NEGATIVE NEGATIVE Final    Comment: (NOTE) The Xpert SA Assay (FDA approved for NASAL specimens in patients 56 years of age and older), is one component of a comprehensive surveillance program. It is not intended to diagnose infection nor to guide or monitor treatment. Performed at [facility], [address] [place], [place] [id number]     Radiology Studies: No results found.    Lerry Cordrey T. Kalia Vahey [facility]  If 7PM-7AM, please contact night-coverage [email or web address] 11/10/2019, 12:31 PM

## 2019-11-11 LAB — MAGNESIUM: Magnesium: 1.7 mg/dL (ref 1.7–2.4)

## 2019-11-11 LAB — RENAL FUNCTION PANEL
Albumin: 3 g/dL — ABNORMAL LOW (ref 3.5–5.0)
Anion gap: 11 (ref 5–15)
BUN: 6 mg/dL (ref 6–20)
CO2: 28 mmol/L (ref 22–32)
Calcium: 9.3 mg/dL (ref 8.9–10.3)
Chloride: 102 mmol/L (ref 98–111)
Creatinine, Ser: 0.81 mg/dL (ref 0.44–1.00)
GFR calc Af Amer: >60 mL/min (ref >60)
GFR calc non Af Amer: >60 mL/min (ref >60)
Glucose, Bld: 119 mg/dL — ABNORMAL HIGH (ref 70–99)
Phosphorus: 4.8 mg/dL — ABNORMAL HIGH (ref 2.5–4.6)
Potassium: 3.2 mmol/L — ABNORMAL LOW (ref 3.5–5.1)
Sodium: 141 mmol/L (ref 135–145)

## 2019-11-11 LAB — CBC
HCT: 30.6 % — ABNORMAL LOW (ref 36.0–46.0)
Hemoglobin: 9.4 g/dL — ABNORMAL LOW (ref 12.0–15.0)
MCH: 30.8 pg (ref 26.0–34.0)
MCHC: 30.7 g/dL (ref 30.0–36.0)
MCV: 100.3 fL — ABNORMAL HIGH (ref 80.0–100.0)
Platelets: 429 10*3/uL — ABNORMAL HIGH (ref 150–400)
RBC: 3.05 MIL/uL — ABNORMAL LOW (ref 3.87–5.11)
RDW: 15.1 % (ref 11.5–15.5)
WBC: 6.9 10*3/uL (ref 4.0–10.5)
nRBC: 0 % (ref 0.0–0.2)

## 2019-11-11 MED ORDER — MAGNESIUM SULFATE 2 GM/50ML IV SOLN
2.0000 g | Freq: Once | INTRAVENOUS | Status: AC
  Administered 2019-11-11: 2 g via INTRAVENOUS
  Filled 2019-11-11: qty 50

## 2019-11-11 MED ORDER — POTASSIUM CHLORIDE CRYS ER 20 MEQ PO TBCR
40.0000 meq | EXTENDED_RELEASE_TABLET | ORAL | Status: AC
  Administered 2019-11-11 (×2): 40 meq via ORAL
  Filled 2019-11-11 (×2): qty 2

## 2019-11-11 MED ORDER — KCL IN DEXTROSE-NACL 20-5-0.45 MEQ/L-%-% IV SOLN
INTRAVENOUS | Status: AC
  Filled 2019-11-11 (×9): qty 1000

## 2019-11-11 MED ORDER — PANTOPRAZOLE SODIUM 40 MG PO TBEC
40.0000 mg | DELAYED_RELEASE_TABLET | Freq: Two times a day (BID) | ORAL | Status: DC
  Administered 2019-11-11 – 2019-11-13 (×4): 40 mg via ORAL
  Filled 2019-11-11 (×4): qty 1

## 2019-11-11 NOTE — Progress Notes (Signed)
PROGRESS NOTE  Susan Todd LPF:[id number] DOB: [birth year]   PCP: Georgann Housekeeper, MD  Patient is from: Home  DOA: 10/24/2019 LOS: 18  Brief Narrative / Interim history: 54 y.o.femalewith a history of HTN/HLD, anxiety/depression,seizure disorder, COPDand diverticulosis who presented on 10/24/19 with worsening abdominal pain. Pain going on since early July initially with vaginal discharge, seen by her gynecologist and had multiple evaluation and was on different antibiotics subsequently referred to GI. CT of the abdomen and pelvis on 10/23/2019 showed several abscesses within the abdomen coming from vagina/rectal region with concern for potential perforated appendicitis. 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 She was started on broad-spectrum antibiotics. GI and general surgery were consulted.  Flex sigmoidoscopy on 9/8 which showed diverticulosis in the rectosigmoid and sigmoid colon along with stenosis and narrowing.  GI recommended outpatient follow-up for colonoscopy.  Repeat CT abdomen and pelvis on 9/9 showed central loculated abscess within the pelvis which is increased in size and is not amenable to percutaneous drainage given its location.  She underwent diagnostic laparotomy with laparoscopic diverting loop ileostomy on 9/13.  MRI pelvis on 9/14 redemonstrated abscess or fistula cavity in the central pelvis containing air and fluid interspersed between the mid sigmoid colon and rectum measuring 3.7 x 3.1 cm suspicious for abscess or fistula.  General surgery following.   Subjective: Seen and examined earlier this morning.  Continues to endorse heartburn, reflux and abdominal pain.  She had some emesis this morning.  Emesis is nonbloody.  Continues to have good output from ostomy.  Objective: Vitals:   11/10/19 0601 11/10/19 1348 11/10/19 2121 11/11/19 0643  BP: (!) 159/76 135/83 135/83 131/87  Pulse: 66 72 70 69  Resp: 17 18 17  18   Temp: 98.5 F (36.9 C) 98.5 F (36.9 C) 98.6 F (37 C) 98.4 F (36.9 C)  TempSrc: Oral Oral Oral Oral  SpO2: 100% 96% 93% 91%  Weight:      Height:        Intake/Output Summary (Last 24 hours) at 11/11/2019 1441 Last data filed at 11/11/2019 1016 Gross per 24 hour  Intake 1081.78 ml  Output 1250 ml  Net -168.22 ml   Filed Weights   10/24/19 0751  Weight: 78.5 kg    Examination:  GENERAL: No apparent distress.  Nontoxic. HEENT: MMM.  Vision and hearing grossly intact.  NECK: Supple.  No apparent JVD.  RESP:  No IWOB.  Fair aeration bilaterally. CVS:  RRR. Heart sounds normal.  ABD/GI/GU: BS+. Abd soft.  Diffuse periostomy tenderness.  Liquid stool in ileostomy bag MSK/EXT:  Moves extremities. No apparent deformity. No edema.  SKIN: no apparent skin lesion or wound NEURO: Awake, alert and oriented appropriately.  No apparent focal neuro deficit. PSYCH: Calm. Normal affect.  Procedures:  9/8-Flex sigmoidoscopy revealed diverticulosis in the rectosigmoid and sigmoid colon along with the stenosis and narrowing.  Biopsies from colon and rectum negative for inflammation or malignancy 9/13-diagnostic laparotomy with laparoscopic diverting loop ileostomy  Microbiology summarized: 9/2-COVID-19 PCR negative. 9/2-blood cultures negative.  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  Abdominal pain: some distention and tenderness on exam.  Liquid stool from ostomy bag. -Abscess reportedly not amenable to IR drainage given its location. -CT abdomen and pelvis and MRI pelvis as above -S/p laparotomy with laparoscopic diverting loop ileostomy as above -Continue ostomy care and IV Zosyn.  Appreciate WOCN -Pain control-on scheduled Tylenol and Robaxin, as needed gabapentin, oxycodone and  IV morphine -General surgery following-de-escalate the diet to clear liquid diet -Increase Protonix to 40 mg twice daily.   Continue GI cocktail -Discontinued  Ibuprofen -Encourage OOB/ambulation  Essential hypertension: BP slightly elevated.  Could be due to pain.  On metoprolol and losartan at home. -Continue metoprolol 25 mg twice daily -Pain control.  May resume home losartan if BP remains elevated.  Macrocytic anemia: Baseline Hgb 10-11 >Hgb 10.2 (admit)>> 9.4 (relatively stable). Anemia panel basically normal. -Continue monitoring  Chronic COPD: Stable. -Continue as needed albuterol  Tobacco use disorder: -Encouraged tobacco cessation -Continue nicotine patch  Hypokalemia/hypomagnesemia: K3.2.  Mg 1.7. -Replenish and recheck  Hyperphosphatemia: P 5.2> 4.8.  Improved. -Continue monitoring  History of seizure disorder: Stable.  He does not seem to be on medication at home.  Anxiety and depression: Somewhat anxious and tearful today. -Continue Ativan to 0.5 mg every 6 hours as needed. On 1 mg daily as needed. -May consider switching to Klonopin to decrease withdrawal  Leukocytosis: Likely due to #1.  Resolved.  GERD -PPI and GI cocktail as above  Body mass index is 26.31 kg/m.         DVT prophylaxis:  enoxaparin (LOVENOX) injection 40 mg Start: 10/24/19 2200 SCDs Start: 10/24/19 1820  Code Status: Full code Family Communication: Patient and/or RN. Available if any question. Status is: Inpatient  Remains inpatient appropriate because:IV treatments appropriate due to intensity of illness or inability to take PO and Inpatient level of care appropriate due to severity of illness   Dispo: The patient is from: Home              Anticipated d/c is to: Home              Anticipated d/c date is: > 3 days              Patient currently is not medically stable to d/c.       Consultants:  GI-signed off General surgery   Sch Meds:  Scheduled Meds: . acetaminophen  1,000 mg Oral Q6H  . amLODipine  5 mg Oral Daily  . aspirin EC  81 mg Oral Daily  . atorvastatin  10 mg Oral QPM    . calcium-vitamin D  1 tablet Oral QHS  . enoxaparin (LOVENOX) injection  40 mg Subcutaneous Q24H  . feeding supplement  1 Container Oral TID BM  . loratadine  10 mg Oral Daily  . methocarbamol  1,000 mg Oral TID  . metoprolol tartrate  25 mg Oral BID  . multivitamin with minerals  1 tablet Oral QHS  . nicotine  7 mg Transdermal Daily  . pantoprazole  40 mg Oral BID  . psyllium  1 packet Oral Daily  . sodium chloride flush  10-40 mL Intracatheter Q12H   Continuous Infusions: . dextrose 5 % and 0.45 % NaCl with KCl 20 mEq/L 75 mL/hr at 11/11/19 1016  . lactated ringers    . piperacillin-tazobactam (ZOSYN)  IV 3.375 g (11/11/19 0943)   PRN Meds:.albuterol, alum & mag hydroxide-simeth, gabapentin, LORazepam, morphine injection, ondansetron **OR** ondansetron (ZOFRAN) IV, oxyCODONE, promethazine, sodium chloride flush  Antimicrobials: Anti-infectives (From admission, onward)   Start     Dose/Rate Route Frequency Ordered Stop   11/03/19 0845  cefoTEtan (CEFOTAN) 2 g in sodium chloride 0.9 % 100 mL IVPB  Status:  Discontinued        2 g 200 mL/hr over 30 Minutes Intravenous On call to O.R. 11/03/19 6045 11/03/19 4098  10/25/19 1000  vancomycin (VANCOCIN) IVPB 1000 mg/200 mL premix  Status:  Discontinued        1,000 mg 200 mL/hr over 60 Minutes Intravenous Every 12 hours 10/24/19 1835 10/28/19 1727   10/24/19 2300  piperacillin-tazobactam (ZOSYN) IVPB 3.375 g        3.375 g 12.5 mL/hr over 240 Minutes Intravenous Every 8 hours 10/24/19 1835     10/24/19 2000  vancomycin (VANCOREADY) IVPB 1500 mg/300 mL  Status:  Discontinued        1,500 mg 150 mL/hr over 120 Minutes Intravenous  Once 10/24/19 1835 10/24/19 1836   10/24/19 2000  vancomycin (VANCOREADY) IVPB 1500 mg/300 mL        1,500 mg 150 mL/hr over 120 Minutes Intravenous  Once 10/24/19 1836 10/24/19 2229   10/24/19 1730  piperacillin-tazobactam (ZOSYN) IVPB 3.375 g        3.375 g 100 mL/hr over 30 Minutes Intravenous  Once  10/24/19 1721 10/24/19 1810       I have personally reviewed the following labs and images: CBC: Recent Labs  Lab 11/05/19 0335 11/06/19 0349 11/07/19 0418 11/09/19 0434 11/11/19 0313  WBC 5.6 11.3* 7.4 7.5 6.9  NEUTROABS 3.8 6.2  --   --   --   HGB 8.2* 10.9* 9.1* 8.6* 9.4*  HCT 26.9* 35.8* 29.7* 28.2* 30.6*  MCV 101.5* 100.8* 101.7* 100.7* 100.3*  PLT 338 609* 389 398 429*   BMP &GFR Recent Labs  Lab 11/05/19 0335 11/05/19 0335 11/06/19 0349 11/06/19 0349 11/07/19 0418 11/08/19 0240 11/09/19 0434 11/10/19 0320 11/11/19 0313  NA 136  --  142  --  139  --  139  --  141  K 4.0  --  3.1*  --  3.9  --  3.6  --  3.2*  CL 102  --  105  --  105  --  106  --  102  CO2 24  --  25  --  26  --  24  --  28  GLUCOSE 135*  --  114*  --  105*  --  92  --  119*  BUN 8  --  8  --  <5*  --  9  --  6  CREATININE 0.56   < > 0.61   < > 0.67  0.66 0.77 0.82  0.88 0.85 0.81  CALCIUM 8.7*  --  8.8*  --  8.4*  --  8.8*  --  9.3  MG 1.7  --  1.6*  --  1.7  --  1.6*  --  1.7  PHOS  --   --   --   --  4.0  --  5.2*  --  4.8*   < > = values in this interval not displayed.   Estimated Creatinine Clearance: 85.3 mL/min (by C-G formula based on SCr of 0.81 mg/dL). Liver & Pancreas: Recent Labs  Lab 11/07/19 0418 11/09/19 0434 11/11/19 0313  ALBUMIN 2.3* 2.5* 3.0*   No results for input(s): LIPASE, AMYLASE in the last 168 hours. No results for input(s): AMMONIA in the last 168 hours. Diabetic: No results for input(s): HGBA1C in the last 72 hours. No results for input(s): GLUCAP in the last 168 hours. Cardiac Enzymes: No results for input(s): CKTOTAL, CKMB, CKMBINDEX, TROPONINI in the last 168 hours. No results for input(s): PROBNP in the last 8760 hours. Coagulation Profile: No results for input(s): INR, PROTIME in the last 168 hours. Thyroid Function Tests: No results  for input(s): TSH, T4TOTAL, FREET4, T3FREE, THYROIDAB in the last 72 hours. Lipid Profile: No results for  input(s): CHOL, HDL, LDLCALC, TRIG, CHOLHDL, LDLDIRECT in the last 72 hours. Anemia Panel: No results for input(s): VITAMINB12, FOLATE, FERRITIN, TIBC, IRON, RETICCTPCT in the last 72 hours. Urine analysis:    Component Value Date/Time   COLORURINE YELLOW 10/24/2019 1822   APPEARANCEUR TURBID (A) 10/24/2019 1822   LABSPEC 1.013 10/24/2019 1822   PHURINE 5.0 10/24/2019 1822   GLUCOSEU NEGATIVE 10/24/2019 1822   HGBUR MODERATE (A) 10/24/2019 1822   BILIRUBINUR NEGATIVE 10/24/2019 1822   KETONESUR 5 (A) 10/24/2019 1822   PROTEINUR 100 (A) 10/24/2019 1822   UROBILINOGEN 0.2 08/23/2006 1144   NITRITE NEGATIVE 10/24/2019 1822   LEUKOCYTESUR LARGE (A) 10/24/2019 1822   Sepsis Labs: Invalid input(s): PROCALCITONIN, LACTICIDVEN  Microbiology: Recent Results (from the past 240 hour(s))  Surgical PCR screen     Status: None   Collection Time: 11/03/19 11:23 PM   Specimen: Nasal Mucosa; Nasal Swab  Result Value Ref Range Status   MRSA, PCR NEGATIVE NEGATIVE Final   Staphylococcus aureus NEGATIVE NEGATIVE Final    Comment: (NOTE) The Xpert SA Assay (FDA approved for NASAL specimens in patients 30 years of age and older), is one component of a comprehensive surveillance program. It is not intended to diagnose infection nor to guide or monitor treatment. Performed at [facility], [address], [place], [place] [address]     Radiology Studies: No results found.    Valincia Touch T. Conrado Nance [facility]  If 7PM-7AM, please contact night-coverage [email or web address] 11/11/2019, 2:41 PM

## 2019-11-11 NOTE — Consult Note (Signed)
WOC Nurse ostomy follow up Stoma type/location: RLQ ileostomy  Patient is actively vomiting and does not want to change pouch today. She is emptying independently.   She is tearful and apologetic.  Emotional support provided.  We will resume teaching tomorrow.    Stomal assessment/size: visulaized through pouch.  Appears 1 3/4"  Peristomal assessment: large peristomal abdominal hernia.  Will add belt tomorrow.  Treatment options for stomal/peristomal skin: belt, barrier ring.  Output soft green stool Ostomy pouching: 2pc. 2 3/4" pouch with barrier ring and belt will be implemented tomorrow.  Education provided: see above.  Enrolled patient in [place] [employer] Discharge program: No Will follow.  Maple Hudson MSN, RN, FNP-BC CWON Wound, Ostomy, Continence Nurse Pager [phone]

## 2019-11-11 NOTE — Progress Notes (Signed)
[facility]:  [phone] General Surgery Progress Note   LOS: 18 days  POD -  7 Days Post-Op  Assessment and Plan: 1.  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  COPD 3.  History of seizures 4.  Anxiety/depression 5.  Anemia   Hgb - 9.4 - 11/11/2019 6.  Hypokalemia  Getting oral K+ 7.  DVT prophylaxis - Lovenox   Principal Problem:   Intra-abdominal abscess (HCC) Active Problems:   Depression   Anxiety   Seizures (HCC)   COPD (chronic obstructive pulmonary disease) (HCC)   Seizure (HCC)  Subjective:  Has not felt well for the last 3 days.  She has regurg and has vomited.  Did not ambulate much yesterday, because she felt bad.  Does not really feel better today.  Objective:   Vitals:   11/10/19 2121 11/11/19 0643  BP: 135/83 131/87  Pulse: 70 69  Resp: 17 18  Temp: 98.6 F (37 C) 98.4 F (36.9 C)  SpO2: 93% 91%     Intake/Output from previous day:  09/19 0701 - 09/20 0700 In: 1114.1 [P.O.:750; I.V.:218; IV Piggyback:146.1] Out: 650 [Emesis/NG output:400; Stool:250]  Intake/Output this shift:  Total I/O In: -  Out: 600 [Emesis/NG output:600]   Physical Exam:   General: WN WF who is alert and oriented.    HEENT: Normal. Pupils equal. .   Lungs: Clear   Abdomen: Distended.   Wound: Ostomy in lower abdomen with output.   Lab Results:    Recent Labs    11/09/19 0434 11/11/19 0313  WBC 7.5 6.9  HGB 8.6* 9.4*  HCT 28.2* 30.6*  PLT 398 429*    BMET   Recent Labs    11/09/19 0434 11/09/19 0434 11/10/19 0320 11/11/19 0313  NA 139  --   --  141  K 3.6  --   --  3.2*  CL 106  --   --  102  CO2 24  --   --  28  GLUCOSE 92  --   --  119*  BUN 9  --   --  6  CREATININE 0.82  0.88   < > 0.85 0.81  CALCIUM 8.8*  --   --  9.3   < > = values in this interval  not displayed.    PT/INR  No results for input(s): LABPROT, INR in the last 72 hours.  ABG  No results for input(s): PHART, HCO3 in the last 72 hours.  Invalid input(s): PCO2, PO2   Studies/Results:  No results found.   Anti-infectives:   Anti-infectives (From admission, onward)   Start     Dose/Rate Route Frequency Ordered Stop   11/03/19 0845  cefoTEtan (CEFOTAN) 2 g in sodium chloride 0.9 % 100 mL IVPB  Status:  Discontinued        2 g 200 mL/hr over 30 Minutes Intravenous On call to O.R. 11/03/19 0831 11/03/19 0842   10/25/19 1000  vancomycin (VANCOCIN) IVPB 1000 mg/200 mL premix  Status:  Discontinued        1,000 mg 200 mL/hr over 60 Minutes Intravenous Every 12 hours 10/24/19 1835 10/28/19 1727   10/24/19 2300  piperacillin-tazobactam (ZOSYN) IVPB 3.375 g        3.375 g 12.5 mL/hr over 240 Minutes Intravenous Every 8 hours 10/24/19 1835     10/24/19  2000  vancomycin (VANCOREADY) IVPB 1500 mg/300 mL  Status:  Discontinued        1,500 mg 150 mL/hr over 120 Minutes Intravenous  Once 10/24/19 1835 10/24/19 1836   10/24/19 2000  vancomycin (VANCOREADY) IVPB 1500 mg/300 mL        1,500 mg 150 mL/hr over 120 Minutes Intravenous  Once 10/24/19 1836 10/24/19 2229   10/24/19 1730  piperacillin-tazobactam (ZOSYN) IVPB 3.375 g        3.375 g 100 mL/hr over 30 Minutes Intravenous  Once 10/24/19 1721 10/24/19 1810      Ovidio Kin, MD, [facility] Office: [phone] 11/11/2019

## 2019-11-12 ENCOUNTER — Inpatient Hospital Stay (HOSPITAL_COMMUNITY): Payer: Self-pay

## 2019-11-12 LAB — MAGNESIUM: Magnesium: 1.8 mg/dL (ref 1.7–2.4)

## 2019-11-12 LAB — CBC
HCT: 29.9 % — ABNORMAL LOW (ref 36.0–46.0)
Hemoglobin: 9.1 g/dL — ABNORMAL LOW (ref 12.0–15.0)
MCH: 30.6 pg (ref 26.0–34.0)
MCHC: 30.4 g/dL (ref 30.0–36.0)
MCV: 100.7 fL — ABNORMAL HIGH (ref 80.0–100.0)
Platelets: 424 10*3/uL — ABNORMAL HIGH (ref 150–400)
RBC: 2.97 MIL/uL — ABNORMAL LOW (ref 3.87–5.11)
RDW: 15.1 % (ref 11.5–15.5)
WBC: 6.8 10*3/uL (ref 4.0–10.5)
nRBC: 0 % (ref 0.0–0.2)

## 2019-11-12 LAB — PREALBUMIN: Prealbumin: 24.9 mg/dL (ref 18–38)

## 2019-11-12 LAB — COMPREHENSIVE METABOLIC PANEL
ALT: 13 U/L (ref 0–44)
AST: 21 U/L (ref 15–41)
Albumin: 2.9 g/dL — ABNORMAL LOW (ref 3.5–5.0)
Alkaline Phosphatase: 86 U/L (ref 38–126)
Anion gap: 10 (ref 5–15)
BUN: <5 mg/dL — ABNORMAL LOW (ref 6–20)
CO2: 29 mmol/L (ref 22–32)
Calcium: 9.5 mg/dL (ref 8.9–10.3)
Chloride: 102 mmol/L (ref 98–111)
Creatinine, Ser: 0.84 mg/dL (ref 0.44–1.00)
GFR calc Af Amer: >60 mL/min (ref >60)
GFR calc non Af Amer: >60 mL/min (ref >60)
Glucose, Bld: 124 mg/dL — ABNORMAL HIGH (ref 70–99)
Potassium: 3.7 mmol/L (ref 3.5–5.1)
Sodium: 141 mmol/L (ref 135–145)
Total Bilirubin: 0.8 mg/dL (ref 0.3–1.2)
Total Protein: 6 g/dL — ABNORMAL LOW (ref 6.5–8.1)

## 2019-11-12 NOTE — Progress Notes (Addendum)
Witnessed pt putting fingers down her throat to vomit. Pt c/o nausea, but continues to make herself sick. Candelaria Stagers, MD notified.   Pt advised not to make herself vomit.

## 2019-11-12 NOTE — Plan of Care (Signed)
  Problem: Clinical Measurements: Goal: Respiratory complications will improve Outcome: Progressing   Problem: Clinical Measurements: Goal: Cardiovascular complication will be avoided Outcome: Progressing   Problem: Nutrition: Goal: Adequate nutrition will be maintained Outcome: Progressing   Problem: Pain Managment: Goal: General experience of comfort will improve Outcome: Progressing   Problem: Clinical Measurements: Goal: Postoperative complications will be avoided or minimized Outcome: Progressing   Problem: Health Behavior/Discharge Planning: Goal: Ability to manage health-related needs will improve Outcome: Progressing   Problem: Nutrition: Goal: Will attain and maintain optimal nutritional status will improve Outcome: Progressing

## 2019-11-12 NOTE — Progress Notes (Signed)
8 Days Post-Op  Subjective: Feels like she is getting reflux and then immediate throws up to follow this still.  On BID protonix.  Not taking in much of her liquids.  Drinking her Boost TID and keeping that down.  Ostomy is still working well.  Feeling very bloated.  ROS: See above, otherwise other systems negative  Objective: Vital signs in last 24 hours: Temp:  [98.6 F (37 C)-98.9 F (37.2 C)] 98.6 F (37 C) (09/21 0528) Pulse Rate:  [71-72] 72 (09/21 0528) Resp:  [16-17] 17 (09/21 0528) BP: (126-136)/(84-86) 136/86 (09/21 0528) SpO2:  [94 %] 94 % (09/21 0528) Last BM Date: 11/11/19  Intake/Output from previous day: 09/20 0701 - 09/21 0700 In: 2848.1 [P.O.:1320; I.V.:1322.4; IV Piggyback:205.7] Out: 900 [Emesis/NG output:600; Stool:300] Intake/Output this shift: Total I/O In: -  Out: 100 [Stool:100]  PE: Heart: regular Lungs: CTAB Abd: distended, incisions c/d/i, ostomy working well with stool present in bag and stoma is pink and viable  Lab Results:  Recent Labs    11/11/19 0313 11/12/19 0357  WBC 6.9 6.8  HGB 9.4* 9.1*  HCT 30.6* 29.9*  PLT 429* 424*   BMET Recent Labs    11/11/19 0313 11/12/19 0357  NA 141 141  K 3.2* 3.7  CL 102 102  CO2 28 29  GLUCOSE 119* 124*  BUN 6 <5*  CREATININE 0.81 0.84  CALCIUM 9.3 9.5   PT/INR No results for input(s): LABPROT, INR in the last 72 hours. CMP     Component Value Date/Time   NA 141 11/12/2019 0357   K 3.7 11/12/2019 0357   CL 102 11/12/2019 0357   CO2 29 11/12/2019 0357   GLUCOSE 124 (H) 11/12/2019 0357   BUN <5 (L) 11/12/2019 0357   CREATININE 0.84 11/12/2019 0357   CREATININE 0.68 10/14/2010 1059   CALCIUM 9.5 11/12/2019 0357   PROT 6.0 (L) 11/12/2019 0357   ALBUMIN 2.9 (L) 11/12/2019 0357   AST 21 11/12/2019 0357   ALT 13 11/12/2019 0357   ALKPHOS 86 11/12/2019 0357   BILITOT 0.8 11/12/2019 0357   GFRNONAA >60 11/12/2019 0357   GFRAA >60 11/12/2019 0357   Lipase     Component Value  Date/Time   LIPASE 18 09/19/2019 1459       Studies/Results: No results found.  Anti-infectives: Anti-infectives (From admission, onward)   Start     Dose/Rate Route Frequency Ordered Stop   11/03/19 0845  cefoTEtan (CEFOTAN) 2 g in sodium chloride 0.9 % 100 mL IVPB  Status:  Discontinued        2 g 200 mL/hr over 30 Minutes Intravenous On call to O.R. 11/03/19 0831 11/03/19 0842   10/25/19 1000  vancomycin (VANCOCIN) IVPB 1000 mg/200 mL premix  Status:  Discontinued        1,000 mg 200 mL/hr over 60 Minutes Intravenous Every 12 hours 10/24/19 1835 10/28/19 1727   10/24/19 2300  piperacillin-tazobactam (ZOSYN) IVPB 3.375 g        3.375 g 12.5 mL/hr over 240 Minutes Intravenous Every 8 hours 10/24/19 1835     10/24/19 2000  vancomycin (VANCOREADY) IVPB 1500 mg/300 mL  Status:  Discontinued        1,500 mg 150 mL/hr over 120 Minutes Intravenous  Once 10/24/19 1835 10/24/19 1836   10/24/19 2000  vancomycin (VANCOREADY) IVPB 1500 mg/300 mL        1,500 mg 150 mL/hr over 120 Minutes Intravenous  Once 10/24/19 1836 10/24/19 2229  10/24/19 1730  piperacillin-tazobactam (ZOSYN) IVPB 3.375 g        3.375 g 100 mL/hr over 30 Minutes Intravenous  Once 10/24/19 1721 10/24/19 1810       Assessment/Plan 1.  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  Continue on boost, but will check a prealbumin.  Would like to avoid TNA, but if unable to continue on diet, may need extra nutritional support.  Will also check a plain film today as she is distended and has not had one post op in setting of continued ileus and vomiting.  2.  COPD 3.  History of seizures 4.  Anxiety/depression 5.  Anemia              Hgb - stable 6.  Hypokalemia - resolved at 3.7, would  ideally like above 4.  FEN - CLD, Boost VTE - Lovenox ID - zosyn 9/2 --> ???duration   LOS: 19 days    Letha Cape , [facility] 11/12/2019, 10:20 AM Please see Amion for pager number during day hours 7:00am-4:30pm or 7:00am -11:30am on weekends

## 2019-11-12 NOTE — Progress Notes (Signed)
PROGRESS NOTE  Susan Todd DXA:[id number] DOB: [birth year]   PCP: Georgann Housekeeper, MD  Patient is from: Home  DOA: 10/24/2019 LOS: 19  Brief Narrative / Interim history: 54 y.o.femalewith a history of HTN/HLD, anxiety/depression,seizure disorder, COPDand diverticulosis who presented on 10/24/19 with worsening abdominal pain. Pain going on since early July initially with vaginal discharge, seen by her gynecologist and had multiple evaluation and was on different antibiotics subsequently referred to GI. CT of the abdomen and pelvis on 10/23/2019 showed several abscesses within the abdomen coming from vagina/rectal region with concern for potential perforated appendicitis. 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 She was started on broad-spectrum antibiotics. GI and general surgery were consulted.  Flex sigmoidoscopy on 9/8 which showed diverticulosis in the rectosigmoid and sigmoid colon along with stenosis and narrowing.  GI recommended outpatient follow-up for colonoscopy.  Repeat CT abdomen and pelvis on 9/9 showed central loculated abscess within the pelvis which is increased in size and is not amenable to percutaneous drainage given its location.  She underwent diagnostic laparotomy with laparoscopic diverting loop ileostomy on 9/13.  MRI pelvis on 9/14 redemonstrated abscess or fistula cavity in the central pelvis containing air and fluid interspersed between the mid sigmoid colon and rectum measuring 3.7 x 3.1 cm suspicious for abscess or fistula.  General surgery following.  Continues to complain of nausea, reflux, heartburn and abdominal pain despite high-dose PPI and GI cocktail.   Subjective: Seen and examined earlier this morning.  No major events overnight or this morning.  Continues to complain burning any throat, reflux and significant abdominal pain. She was ambulating in the hallway before I walked in.  Objective: Vitals:    11/11/19 2203 11/12/19 0528 11/12/19 1115 11/12/19 1400  BP: 128/84 136/86 129/89 (!) 141/90  Pulse: 71 72 94 73  Resp: 17 17  15   Temp: 98.7 F (37.1 C) 98.6 F (37 C)  99.2 F (37.3 C)  TempSrc: Oral Oral  Oral  SpO2: 94% 94%  92%  Weight:      Height:        Intake/Output Summary (Last 24 hours) at 11/12/2019 1512 Last data filed at 11/12/2019 1400 Gross per 24 hour  Intake 2871.62 ml  Output 300 ml  Net 2571.62 ml   Filed Weights   10/24/19 0751  Weight: 78.5 kg    Examination:  GENERAL: No apparent distress.  Nontoxic. HEENT: MMM.  Vision and hearing grossly intact.  NECK: Supple.  No apparent JVD.  RESP:  No IWOB.  Fair aeration bilaterally. CVS:  RRR. Heart sounds normal.  ABD/GI/GU: BS+. Abd soft.  Diffuse peristomal tenderness.  Liquid stool in ileostomy bag. MSK/EXT:  Moves extremities. No apparent deformity. No edema.  SKIN: no apparent skin lesion or wound NEURO: Awake, alert and oriented appropriately.  No apparent focal neuro deficit. PSYCH: Calm. Normal affect.  Procedures:  9/8-Flex sigmoidoscopy revealed diverticulosis in the rectosigmoid and sigmoid colon along with the stenosis and narrowing.  Biopsies from colon and rectum negative for inflammation or malignancy 9/13-diagnostic laparotomy with laparoscopic diverting loop ileostomy  Microbiology summarized: 9/2-COVID-19 PCR negative. 9/2-blood cultures negative.  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  Abdominal pain: some distention and tenderness on exam.  Liquid stool from ostomy bag. -Abscess reportedly not amenable to IR drainage given its location. -CT abdomen and pelvis and MRI pelvis as above -S/p laparotomy with laparoscopic diverting loop ileostomy as above -Continue ostomy care and  IV Zosyn.  Appreciate WOCN -Pain control-on scheduled Tylenol and Robaxin, as needed gabapentin, oxycodone and IV  morphine -General surgery following-de-escalate the diet to clear liquid diet -Continue Protonix to 40 mg twice daily, and GI cocktail as needed -Discontinued  Ibuprofen -Encourage OOB/ambulation  Essential hypertension: BP slightly elevated.  Could be due to pain.  On metoprolol and losartan at home. -Continue metoprolol 25 mg twice daily -May resume home losartan if BP remains elevated.  Macrocytic anemia: Baseline Hgb 10-11 >Hgb 10.2 (admit)>> 9.1 (relatively stable). Anemia panel basically normal. -Continue monitoring  Chronic COPD: Stable. -Continue as needed albuterol  Tobacco use disorder: -Encouraged tobacco cessation -Continue nicotine patch  Hypokalemia/hypomagnesemia: K3.2.  Mg 1.7. -Replenish and recheck  Hyperphosphatemia: P 5.2> 4.8.  Improved. -Continue monitoring  History of seizure disorder: Stable.  He does not seem to be on medication at home.  Anxiety and depression: Somewhat anxious and tearful today. -Continue Ativan to 0.5 mg every 6 hours as needed. On 1 mg daily as needed at home. -May consider switching to Klonopin to decrease withdrawal  Leukocytosis: Likely due to #1.  Resolved.  GERD -PPI and GI cocktail as above  Body mass index is 26.31 kg/m.         DVT prophylaxis:  enoxaparin (LOVENOX) injection 40 mg Start: 10/24/19 2200 SCDs Start: 10/24/19 1820  Code Status: Full code Family Communication: Patient and/or RN. Available if any question. Status is: Inpatient  Remains inpatient appropriate because:IV treatments appropriate due to intensity of illness or inability to take PO and Inpatient level of care appropriate due to severity of illness   Dispo: The patient is from: Home              Anticipated d/c is to: Home              Anticipated d/c date is: > 3 days              Patient currently is not medically stable to d/c.       Consultants:  GI-signed off General surgery   Sch Meds:  Scheduled Meds: .  acetaminophen  1,000 mg Oral Q6H  . amLODipine  5 mg Oral Daily  . aspirin EC  81 mg Oral Daily  . atorvastatin  10 mg Oral QPM  . calcium-vitamin D  1 tablet Oral QHS  . enoxaparin (LOVENOX) injection  40 mg Subcutaneous Q24H  . feeding supplement  1 Container Oral TID BM  . loratadine  10 mg Oral Daily  . methocarbamol  1,000 mg Oral TID  . metoprolol tartrate  25 mg Oral BID  . multivitamin with minerals  1 tablet Oral QHS  . nicotine  7 mg Transdermal Daily  . pantoprazole  40 mg Oral BID  . psyllium  1 packet Oral Daily  . sodium chloride flush  10-40 mL Intracatheter Q12H   Continuous Infusions: . dextrose 5 % and 0.45 % NaCl with KCl 20 mEq/L 75 mL/hr at 11/12/19 1206  . lactated ringers    . piperacillin-tazobactam (ZOSYN)  IV 3.375 g (11/12/19 1121)   PRN Meds:.albuterol, alum & mag hydroxide-simeth, gabapentin, LORazepam, morphine injection, ondansetron **OR** ondansetron (ZOFRAN) IV, oxyCODONE, promethazine, sodium chloride flush  Antimicrobials: Anti-infectives (From admission, onward)   Start     Dose/Rate Route Frequency Ordered Stop   11/03/19 0845  cefoTEtan (CEFOTAN) 2 g in sodium chloride 0.9 % 100 mL IVPB  Status:  Discontinued        2 g 200  mL/hr over 30 Minutes Intravenous On call to O.R. 11/03/19 0831 11/03/19 0842   10/25/19 1000  vancomycin (VANCOCIN) IVPB 1000 mg/200 mL premix  Status:  Discontinued        1,000 mg 200 mL/hr over 60 Minutes Intravenous Every 12 hours 10/24/19 1835 10/28/19 1727   10/24/19 2300  piperacillin-tazobactam (ZOSYN) IVPB 3.375 g        3.375 g 12.5 mL/hr over 240 Minutes Intravenous Every 8 hours 10/24/19 1835     10/24/19 2000  vancomycin (VANCOREADY) IVPB 1500 mg/300 mL  Status:  Discontinued        1,500 mg 150 mL/hr over 120 Minutes Intravenous  Once 10/24/19 1835 10/24/19 1836   10/24/19 2000  vancomycin (VANCOREADY) IVPB 1500 mg/300 mL        1,500 mg 150 mL/hr over 120 Minutes Intravenous  Once 10/24/19 1836 10/24/19  2229   10/24/19 1730  piperacillin-tazobactam (ZOSYN) IVPB 3.375 g        3.375 g 100 mL/hr over 30 Minutes Intravenous  Once 10/24/19 1721 10/24/19 1810       I have personally reviewed the following labs and images: CBC: Recent Labs  Lab 11/06/19 0349 11/07/19 0418 11/09/19 0434 11/11/19 0313 11/12/19 0357  WBC 11.3* 7.4 7.5 6.9 6.8  NEUTROABS 6.2  --   --   --   --   HGB 10.9* 9.1* 8.6* 9.4* 9.1*  HCT 35.8* 29.7* 28.2* 30.6* 29.9*  MCV 100.8* 101.7* 100.7* 100.3* 100.7*  PLT 609* 389 398 429* 424*   BMP &GFR Recent Labs  Lab 11/06/19 0349 11/06/19 0349 11/07/19 0418 11/07/19 0418 11/08/19 0240 11/09/19 0434 11/10/19 0320 11/11/19 0313 11/12/19 0357  NA 142  --  139  --   --  139  --  141 141  K 3.1*  --  3.9  --   --  3.6  --  3.2* 3.7  CL 105  --  105  --   --  106  --  102 102  CO2 25  --  26  --   --  24  --  28 29  GLUCOSE 114*  --  105*  --   --  92  --  119* 124*  BUN 8  --  <5*  --   --  9  --  6 <5*  CREATININE 0.61   < > 0.67  0.66   < > 0.77 0.82  0.88 0.85 0.81 0.84  CALCIUM 8.8*  --  8.4*  --   --  8.8*  --  9.3 9.5  MG 1.6*  --  1.7  --   --  1.6*  --  1.7 1.8  PHOS  --   --  4.0  --   --  5.2*  --  4.8*  --    < > = values in this interval not displayed.   Estimated Creatinine Clearance: 82.3 mL/min (by C-G formula based on SCr of 0.84 mg/dL). Liver & Pancreas: Recent Labs  Lab 11/07/19 0418 11/09/19 0434 11/11/19 0313 11/12/19 0357  AST  --   --   --  21  ALT  --   --   --  13  ALKPHOS  --   --   --  86  BILITOT  --   --   --  0.8  PROT  --   --   --  6.0*  ALBUMIN 2.3* 2.5* 3.0* 2.9*   No results for input(s): LIPASE,  AMYLASE in the last 168 hours. No results for input(s): AMMONIA in the last 168 hours. Diabetic: No results for input(s): HGBA1C in the last 72 hours. No results for input(s): GLUCAP in the last 168 hours. Cardiac Enzymes: No results for input(s): CKTOTAL, CKMB, CKMBINDEX, TROPONINI in the last 168 hours. No  results for input(s): PROBNP in the last 8760 hours. Coagulation Profile: No results for input(s): INR, PROTIME in the last 168 hours. Thyroid Function Tests: No results for input(s): TSH, T4TOTAL, FREET4, T3FREE, THYROIDAB in the last 72 hours. Lipid Profile: No results for input(s): CHOL, HDL, LDLCALC, TRIG, CHOLHDL, LDLDIRECT in the last 72 hours. Anemia Panel: No results for input(s): VITAMINB12, FOLATE, FERRITIN, TIBC, IRON, RETICCTPCT in the last 72 hours. Urine analysis:    Component Value Date/Time   COLORURINE YELLOW 10/24/2019 1822   APPEARANCEUR TURBID (A) 10/24/2019 1822   LABSPEC 1.013 10/24/2019 1822   PHURINE 5.0 10/24/2019 1822   GLUCOSEU NEGATIVE 10/24/2019 1822   HGBUR MODERATE (A) 10/24/2019 1822   BILIRUBINUR NEGATIVE 10/24/2019 1822   KETONESUR 5 (A) 10/24/2019 1822   PROTEINUR 100 (A) 10/24/2019 1822   UROBILINOGEN 0.2 08/23/2006 1144   NITRITE NEGATIVE 10/24/2019 1822   LEUKOCYTESUR LARGE (A) 10/24/2019 1822   Sepsis Labs: Invalid input(s): PROCALCITONIN, LACTICIDVEN  Microbiology: Recent Results (from the past 240 hour(s))  Surgical PCR screen     Status: None   Collection Time: 11/03/19 11:23 PM   Specimen: Nasal Mucosa; Nasal Swab  Result Value Ref Range Status   MRSA, PCR NEGATIVE NEGATIVE Final   Staphylococcus aureus NEGATIVE NEGATIVE Final    Comment: (NOTE) The Xpert SA Assay (FDA approved for NASAL specimens in patients 56 years of age and older), is one component of a comprehensive surveillance program. It is not intended to diagnose infection nor to guide or monitor treatment. Performed at [facility], [address] [place], [place] [id number]     Radiology Studies: DG Abd Portable 1V  Result Date: 11/12/2019 CLINICAL DATA:  Ileus.  Laparotomy 11/04/19 with diverting ileostomy. EXAM: PORTABLE ABDOMEN - 1 VIEW COMPARISON:  CT of the abdomen and pelvis 10/31/2019 FINDINGS: Bowel gas pattern is unremarkable. No  significant dilated loops of large or small present. No definite free air is present. Prominent calcification within sigmoid diverticulum is again noted. Degenerative changes are evident in the lumbar spine. IMPRESSION: 1. Normal bowel gas pattern. 2. No definite free air. 3. Sigmoid diverticulum. Electronically Signed   By: Marin Robertshristopher  Mattern M.D.   On: 11/12/2019 10:53      Jihad Brownlow T. Agam Tuohy [facility]  If 7PM-7AM, please contact night-coverage [email or web address] 11/12/2019, 3:12 PM

## 2019-11-13 ENCOUNTER — Inpatient Hospital Stay (HOSPITAL_COMMUNITY): Payer: Self-pay

## 2019-11-13 LAB — CREATININE, SERUM
Creatinine, Ser: 0.87 mg/dL (ref 0.44–1.00)
GFR calc Af Amer: >60 mL/min (ref >60)
GFR calc non Af Amer: >60 mL/min (ref >60)

## 2019-11-13 MED ORDER — PHENOL 1.4 % MT LIQD
1.0000 | OROMUCOSAL | Status: DC | PRN
  Administered 2019-11-13 (×2): 1 via OROMUCOSAL
  Filled 2019-11-13 (×2): qty 177

## 2019-11-13 MED ORDER — METOPROLOL TARTRATE 5 MG/5ML IV SOLN
5.0000 mg | Freq: Three times a day (TID) | INTRAVENOUS | Status: DC
  Administered 2019-11-13 – 2019-11-27 (×37): 5 mg via INTRAVENOUS
  Filled 2019-11-13 (×41): qty 5

## 2019-11-13 MED ORDER — MENTHOL 3 MG MT LOZG: 1.0000 | LOZENGE | OROMUCOSAL | Status: DC | PRN

## 2019-11-13 MED ORDER — IOHEXOL 300 MG/ML  SOLN
100.0000 mL | Freq: Once | INTRAMUSCULAR | Status: AC | PRN
  Administered 2019-11-13: 100 mL via INTRAVENOUS

## 2019-11-13 MED ORDER — MORPHINE SULFATE (PF) 2 MG/ML IV SOLN
1.0000 mg | INTRAVENOUS | Status: DC | PRN
  Administered 2019-11-13 – 2019-11-14 (×6): 4 mg via INTRAVENOUS
  Administered 2019-11-14 (×2): 2 mg via INTRAVENOUS
  Administered 2019-11-14 – 2019-11-19 (×27): 4 mg via INTRAVENOUS
  Administered 2019-11-19: 2 mg via INTRAVENOUS
  Administered 2019-11-19 – 2019-11-21 (×13): 4 mg via INTRAVENOUS
  Filled 2019-11-13 (×7): qty 2
  Filled 2019-11-13: qty 1
  Filled 2019-11-13 (×21): qty 2
  Filled 2019-11-13: qty 1
  Filled 2019-11-13 (×20): qty 2

## 2019-11-13 MED ORDER — IOHEXOL 9 MG/ML PO SOLN
500.0000 mL | ORAL | Status: AC
  Administered 2019-11-13 (×2): 500 mL via ORAL

## 2019-11-13 MED ORDER — PANTOPRAZOLE SODIUM 40 MG IV SOLR
40.0000 mg | INTRAVENOUS | Status: DC
  Administered 2019-11-13 – 2019-11-25 (×12): 40 mg via INTRAVENOUS
  Filled 2019-11-13 (×12): qty 40

## 2019-11-13 MED ORDER — METHOCARBAMOL 1000 MG/10ML IJ SOLN
1000.0000 mg | Freq: Three times a day (TID) | INTRAVENOUS | Status: DC
  Administered 2019-11-13 – 2019-11-26 (×37): 1000 mg via INTRAVENOUS
  Filled 2019-11-13 (×4): qty 1000
  Filled 2019-11-13: qty 10
  Filled 2019-11-13 (×2): qty 1000
  Filled 2019-11-13: qty 10
  Filled 2019-11-13 (×24): qty 1000
  Filled 2019-11-13: qty 10
  Filled 2019-11-13 (×3): qty 1000
  Filled 2019-11-13: qty 10
  Filled 2019-11-13 (×2): qty 1000

## 2019-11-13 MED ORDER — LORAZEPAM 2 MG/ML IJ SOLN
0.5000 mg | Freq: Four times a day (QID) | INTRAMUSCULAR | Status: DC | PRN
  Administered 2019-11-13 – 2019-11-19 (×15): 0.5 mg via INTRAVENOUS
  Filled 2019-11-13 (×17): qty 1

## 2019-11-13 MED ORDER — IOHEXOL 9 MG/ML PO SOLN
ORAL | Status: AC
  Filled 2019-11-13: qty 1000

## 2019-11-13 NOTE — Progress Notes (Signed)
Radiology called and spoke to me about patient's scan.  They are concerned about a bowel obstruction although she is having ileostomy output.  However, her stomach is massively distended as is her small bowel.  We will place an NGT today and make her NPO.  It is likely now that she will require TNA, but will hold on that today.  This has all been discussed with the patient and she understands and agrees with the plan.  Letha Cape 12:30 PM 11/13/2019

## 2019-11-13 NOTE — Progress Notes (Addendum)
PROGRESS NOTE    Susan Todd  XKG:[id number] DOB: Jul 17, 1965 DOA: 10/24/2019 PCP: Georgann Housekeeper, MD    Chief Complaint  Patient presents with  . Abdominal Pain    Brief Narrative:  54 year old lady prior history of hypertension, hyperlipidemia, anxiety, depression, seizure disorder, COPD presents on 9/2 with worsening abdominal pain.  CT of the abdomen pelvis showed several abscesses within the abdomen along with colovaginal fistula loss.  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  GI and general surgery were consulted.  Patient underwent flex sigmoidoscopy on 10/30/2019 showing diverticulosis in the rectosigmoid and sigmoid colon along with stenosis and narrowing.  GI recommended outpatient follow-up with her colonoscopy.  Repeat CT of the abdomen pelvis on 9/9 showed central loculated abscess within the pelvis increased in size and not amenable to percutaneous drainage given its location.  She underwent diagnostic laparotomy with laparoscopic diverting loop ileostomy on 11/04/2019.  MRI of the pelvis on 11/05/2019 demonstrated abscess/fistula cavity in the central pelvis containing air and fluid interspersed between the mid sigmoid colon and rectum measuring 3.7 into 3.1 cm suspicious for abscess.   General surgery, on board, pt continues to have some nausea, and abd pain.  abd x ray unremarkable. CT abd and pelvis showed Examination is positive for small bowel obstruction. Transition point is identified within the right lower quadrant of the abdomen in the area of the inflamed appendix. She is made NPO and Ng tube placed.    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  Not amenable to IR drainage given its location. S/p laparotomy with laparoscopic diverting loop ileostomy. Patient has been on IV Zosyn from 10/24/2019 till 11/13/2019, will stop the antibiotics and monitor.  Continue with Protonix and GI cocktail.  Completed 20 days of IV antibiotics so far. Patient continues to have abdominal pain and distention and abdominal x-rays are unremarkable was followed with a CT of the abdomen and pelvis showing small bowel obstruction with transition point in the right lower quadrant at the site of the appendix.  NG tube was placed to she was made n.p.o., general surgery on board and following.   Anxiety and depression. Stable. On ativan prn.   COPD:  No wheezing heard.    Hypokalemia, hypomagnesemia  Replaced.    Hyperphosphatemia:  Improved.    Seizures:  Stable.   GERD" On PPI IV daily.   Anemia of chronic disease/Macrocytic anemia;  Anemia panel reviewed.     essential hypertension:  Well controlled.  On IV metoprolol .     DVT prophylaxis: lovenox.  Code Status: full code.  Family Communication: (none at bedside) Disposition:   Status is: Inpatient  Remains inpatient appropriate because:Ongoing diagnostic testing needed not appropriate for outpatient work up, IV treatments appropriate due to intensity of illness or inability to take PO and Inpatient level of care appropriate due to severity of illness   Dispo: The patient is from: Home              Anticipated d/c is to: pending  Anticipated d/c date is: > 3 days              Patient currently is not medically stable to d/c.       Consultants:   General surgery  Procedures:  9/8-Flex sigmoidoscopy revealed diverticulosis in the rectosigmoid and sigmoid colon along with the stenosis and narrowing.  Biopsies from colon and rectum  negative for inflammation or malignancy 9/13-diagnostic laparotomy with laparoscopic diverting loop ileostomy Antimicrobials:  Antibiotics Given (last 72 hours)    Date/Time Action Medication Dose Rate   11/10/19 1823 New Bag/Given   piperacillin-tazobactam (ZOSYN) IVPB 3.375 g 3.375 g 12.5 mL/hr   11/11/19 0225 New Bag/Given   piperacillin-tazobactam (ZOSYN) IVPB 3.375 g 3.375 g 12.5 mL/hr   11/11/19 0943 New Bag/Given   piperacillin-tazobactam (ZOSYN) IVPB 3.375 g 3.375 g 12.5 mL/hr   11/11/19 1711 New Bag/Given   piperacillin-tazobactam (ZOSYN) IVPB 3.375 g 3.375 g 12.5 mL/hr   11/12/19 0143 New Bag/Given   piperacillin-tazobactam (ZOSYN) IVPB 3.375 g 3.375 g 12.5 mL/hr   11/12/19 1121 New Bag/Given   piperacillin-tazobactam (ZOSYN) IVPB 3.375 g 3.375 g 12.5 mL/hr   11/12/19 1741 New Bag/Given   piperacillin-tazobactam (ZOSYN) IVPB 3.375 g 3.375 g 12.5 mL/hr   11/13/19 0148 New Bag/Given   piperacillin-tazobactam (ZOSYN) IVPB 3.375 g 3.375 g 12.5 mL/hr   11/13/19 1030 New Bag/Given   piperacillin-tazobactam (ZOSYN) IVPB 3.375 g 3.375 g 12.5 mL/hr       Subjective: Nausea, vomiting and abd discomfort.   Objective: Vitals:   11/12/19 2102 11/13/19 0535 11/13/19 0902 11/13/19 1413  BP: (!) 144/82 116/74 118/84 133/79  Pulse: 74 61 78 67  Resp: 16 16 16 16   Temp: 98.6 F (37 C) 98 F (36.7 C)  98.6 F (37 C)  TempSrc:    Oral  SpO2: 94% 95% 94% 97%  Weight:      Height:        Intake/Output Summary (Last 24 hours) at 11/13/2019 1534 Last data filed at 11/13/2019 1422 Gross per 24 hour  Intake 2342.03 ml  Output 3400 ml  Net -1057.97 ml   Filed Weights   10/24/19 0751  Weight: 78.5 kg    Examination:  General exam: Appears calm and comfortable  Respiratory system: Clear to auscultation. Respiratory effort normal. Cardiovascular system: S1 & S2 heard, RRR. No JVD, No pedal edema. Gastrointestinal system: Abdomen is soft,  Tender, distended, ileostomy, with  stool in  the bag.  Central nervous system: Alert and oriented. No focal neurological deficits. Extremities: Symmetric 5 x 5 power. Skin: No rashes, lesions or ulcers Psychiatry:  Mood & affect appropriate.     Data Reviewed: I have personally reviewed following labs and imaging studies  CBC: Recent Labs  Lab 11/07/19 0418 11/09/19 0434 11/11/19 0313 11/12/19 0357  WBC 7.4 7.5 6.9 6.8  HGB 9.1* 8.6* 9.4* 9.1*  HCT 29.7* 28.2* 30.6* 29.9*  MCV 101.7* 100.7* 100.3* 100.7*  PLT 389 398 429* 424*    Basic Metabolic Panel: Recent Labs  Lab 11/07/19 0418 11/08/19 0240 11/09/19 0434 11/10/19 0320 11/11/19 0313 11/12/19 0357 11/13/19 0355  NA 139  --  139  --  141 141  --   K 3.9  --  3.6  --  3.2* 3.7  --   CL 105  --  106  --  102 102  --   CO2 26  --  24  --  28 29  --   GLUCOSE 105*  --  92  --  119* 124*  --   BUN <5*  --  9  --  6 <5*  --   CREATININE 0.67  0.66   < > 0.82  0.88 0.85 0.81 0.84 0.87  CALCIUM 8.4*  --  8.8*  --  9.3 9.5  --   MG 1.7  --  1.6*  --  1.7 1.8  --   PHOS 4.0  --  5.2*  --  4.8*  --   --    < > = values in this interval not displayed.    GFR: Estimated Creatinine Clearance: 79.4 mL/min (by C-G formula based on SCr of 0.87 mg/dL).  Liver Function Tests: Recent Labs  Lab 11/07/19 0418 11/09/19 0434 11/11/19 0313 11/12/19 0357  AST  --   --   --  21  ALT  --   --   --  13  ALKPHOS  --   --   --  86  BILITOT  --   --   --  0.8  PROT  --   --   --  6.0*  ALBUMIN 2.3* 2.5* 3.0* 2.9*    CBG: No results for input(s): GLUCAP in the last 168 hours.   Recent Results (from the past 240 hour(s))  Surgical PCR screen     Status: None   Collection Time: 11/03/19 11:23 PM   Specimen: Nasal Mucosa; Nasal Swab  Result Value Ref Range Status   MRSA, PCR NEGATIVE NEGATIVE Final   Staphylococcus aureus NEGATIVE NEGATIVE Final    Comment: (NOTE) The Xpert SA Assay (FDA approved for NASAL specimens in patients 6 years of age and  older), is one component of a comprehensive surveillance program. It is not intended to diagnose infection nor to guide or monitor treatment. Performed at [facility], [address], [place], [place] [address]          Radiology Studies: CT ABDOMEN PELVIS W CONTRAST  Result Date: 11/13/2019 CLINICAL DATA:  Evaluate for intra-abdominal abscess EXAM: CT ABDOMEN AND PELVIS WITH CONTRAST TECHNIQUE: Multidetector CT imaging of the abdomen and pelvis was performed using the standard protocol following bolus administration of intravenous contrast. CONTRAST:  OMNIPAQUE IOHEXOL 300 MG/ML  SOLN COMPARISON:  MR pelvis 11/05/2019 and CT AP 10/31/2019 FINDINGS: Lower chest: Subsegmental atelectasis noted in the lung bases. No pleural effusion or airspace consolidation. Hepatobiliary: No suspicious liver abnormality. Focal fatty deposition identified along the falciform ligament. Gallbladder unremarkable. Pancreas: Unremarkable. No pancreatic ductal dilatation or surrounding inflammatory changes. Spleen: Normal in size without focal abnormality. Adrenals/Urinary Tract: Normal appearance of the adrenal glands. The kidneys are unremarkable. Bladder is unremarkable. No gas identified within the lumen of the bladder. Stomach/Bowel: There is marked diffuse distension of the stomach which is filled with fluid. The small bowel loops are diffusely dilated measuring up to 3.9 cm. Transition point is identified within the right lower quadrant of the abdomen in the area of the inflamed appendix, image 55/5. The distal small bowel loops appear decompressed to the level of the right abdominal ileostomy. Normal appearance of the colon. Thickened and inflamed appendix is again noted within the right lower quadrant of the abdomen measuring 1.2 cm with surrounding fat stranding. Previously noted gas and fluid collection adjacent to the tip of appendix has decreased in size from previous exam measuring 2.5  x 2.5 cm, image 74/2. Previously this measured 3.7 x 3.1 cm. Adjacent inflammation of the sigmoid colon (image 75/2) with small  fistula tract (image 72/2) is again noted. Vascular/Lymphatic: Aortic atherosclerosis. Multiple prominent retroperitoneal lymph nodes are identified, likely reactive. Reproductive: Uterus is unremarkable. Fluid density structure within the right ovary is again noted measuring 3.3 cm, image 74/2. Unchanged. Other: Trace free fluid identified within the right lower quadrant of the abdomen surrounding the appendix and in the dependent portion of the pelvis. No new fluid collections identified. Musculoskeletal: Mild lumbar scoliosis with degenerative disc disease. IMPRESSION: 1. Examination is positive for small bowel obstruction. Transition point is identified within the right lower quadrant of the abdomen in the area of the inflamed appendix. 2. Decrease in size of gas and fluid collection adjacent to the tip of appendix. 3. Similar appearance of inflammatory changes of the sigmoid colon with small fistula tract. 4. Aortic atherosclerosis. Aortic Atherosclerosis (ICD10-I70.0). Emergent results were called by telephone at the time of interpretation on 11/13/2019 at 12:21 pm to provider [facility] , who verbally acknowledged these results. Electronically Signed   By: Signa Kellaylor  Stroud M.D.   On: 11/13/2019 12:22   DG Abd Portable 1V  Result Date: 11/12/2019 CLINICAL DATA:  Ileus.  Laparotomy 11/04/19 with diverting ileostomy. EXAM: PORTABLE ABDOMEN - 1 VIEW COMPARISON:  CT of the abdomen and pelvis 10/31/2019 FINDINGS: Bowel gas pattern is unremarkable. No significant dilated loops of large or small present. No definite free air is present. Prominent calcification within sigmoid diverticulum is again noted. Degenerative changes are evident in the lumbar spine. IMPRESSION: 1. Normal bowel gas pattern. 2. No definite free air. 3. Sigmoid diverticulum. Electronically Signed   By: Marin Robertshristopher   Mattern M.D.   On: 11/12/2019 10:53        Scheduled Meds: . enoxaparin (LOVENOX) injection  40 mg Subcutaneous Q24H  . iohexol      . metoprolol tartrate  5 mg Intravenous Q8H  . nicotine  7 mg Transdermal Daily  . pantoprazole (PROTONIX) IV  40 mg Intravenous Q24H  . psyllium  1 packet Oral Daily  . sodium chloride flush  10-40 mL Intracatheter Q12H   Continuous Infusions: . dextrose 5 % and 0.45 % NaCl with KCl 20 mEq/L 75 mL/hr at 11/13/19 1421  . lactated ringers    . methocarbamol (ROBAXIN) IV 1,000 mg (11/13/19 1524)  . piperacillin-tazobactam (ZOSYN)  IV 3.375 g (11/13/19 1030)     LOS: 20 days        Kathlen ModyVijaya Crystalynn Mcinerney, MD [facility]   To contact the attending provider between 7A-7P or the covering provider during after hours 7P-7A, please log into the web site [email or web address] and access using universal [place] password for that web site. If you do not have the password, please call the hospital operator.  11/13/2019, 3:34 PM

## 2019-11-13 NOTE — Progress Notes (Addendum)
9 Days Post-Op  Subjective:  Feeling better today.  Woke up hungry and wanting to try some eggs, toast, milk, etc.  Had some vomiting still yesterday, but once again states it is when she gets bad reflux despite BID protonix.  Ambulating in the halls.  Prealbumin is 24.9.  Keeping her boost down  ROS: See above, otherwise other systems negative  Objective: Vital signs in last 24 hours: Temp:  [98 F (36.7 C)-99.2 F (37.3 C)] 98 F (36.7 C) (09/22 0535) Pulse Rate:  [61-94] 78 (09/22 0902) Resp:  [15-16] 16 (09/22 0902) BP: (116-144)/(74-90) 118/84 (09/22 0902) SpO2:  [92 %-95 %] 94 % (09/22 0902) Last BM Date: 11/12/19  Intake/Output from previous day: 09/21 0701 - 09/22 0700 In: 2897.4 [P.O.:1200; I.V.:1570.1; IV Piggyback:127.3] Out: 400 [Stool:400] Intake/Output this shift: Total I/O In: 240 [P.O.:240] Out: 0   PE: Abd: soft, still appears distended, +BS, incisions c/d/i, ileostomy with enteric contents present.  Stoma is pink and viable  Lab Results:  Recent Labs    11/11/19 0313 11/12/19 0357  WBC 6.9 6.8  HGB 9.4* 9.1*  HCT 30.6* 29.9*  PLT 429* 424*   BMET Recent Labs    11/11/19 0313 11/11/19 0313 11/12/19 0357 11/13/19 0355  NA 141  --  141  --   K 3.2*  --  3.7  --   CL 102  --  102  --   CO2 28  --  29  --   GLUCOSE 119*  --  124*  --   BUN 6  --  <5*  --   CREATININE 0.81   < > 0.84 0.87  CALCIUM 9.3  --  9.5  --    < > = values in this interval not displayed.   PT/INR No results for input(s): LABPROT, INR in the last 72 hours. CMP     Component Value Date/Time   NA 141 11/12/2019 0357   K 3.7 11/12/2019 0357   CL 102 11/12/2019 0357   CO2 29 11/12/2019 0357   GLUCOSE 124 (H) 11/12/2019 0357   BUN <5 (L) 11/12/2019 0357   CREATININE 0.87 11/13/2019 0355   CREATININE 0.68 10/14/2010 1059   CALCIUM 9.5 11/12/2019 0357   PROT 6.0 (L) 11/12/2019 0357   ALBUMIN 2.9 (L) 11/12/2019 0357   AST 21 11/12/2019 0357   ALT 13 11/12/2019  0357   ALKPHOS 86 11/12/2019 0357   BILITOT 0.8 11/12/2019 0357   GFRNONAA >60 11/13/2019 0355   GFRAA >60 11/13/2019 0355   Lipase     Component Value Date/Time   LIPASE 18 09/19/2019 1459       Studies/Results: DG Abd Portable 1V  Result Date: 11/12/2019 CLINICAL DATA:  Ileus.  Laparotomy 11/04/19 with diverting ileostomy. EXAM: PORTABLE ABDOMEN - 1 VIEW COMPARISON:  CT of the abdomen and pelvis 10/31/2019 FINDINGS: Bowel gas pattern is unremarkable. No significant dilated loops of large or small present. No definite free air is present. Prominent calcification within sigmoid diverticulum is again noted. Degenerative changes are evident in the lumbar spine. IMPRESSION: 1. Normal bowel gas pattern. 2. No definite free air. 3. Sigmoid diverticulum. Electronically Signed   By: Marin Roberts M.D.   On: 11/12/2019 10:53    Anti-infectives: Anti-infectives (From admission, onward)   Start     Dose/Rate Route Frequency Ordered Stop   11/03/19 0845  cefoTEtan (CEFOTAN) 2 g in sodium chloride 0.9 % 100 mL IVPB  Status:  Discontinued  2 g 200 mL/hr over 30 Minutes Intravenous On call to O.R. 11/03/19 0831 11/03/19 0842   10/25/19 1000  vancomycin (VANCOCIN) IVPB 1000 mg/200 mL premix  Status:  Discontinued        1,000 mg 200 mL/hr over 60 Minutes Intravenous Every 12 hours 10/24/19 1835 10/28/19 1727   10/24/19 2300  piperacillin-tazobactam (ZOSYN) IVPB 3.375 g        3.375 g 12.5 mL/hr over 240 Minutes Intravenous Every 8 hours 10/24/19 1835     10/24/19 2000  vancomycin (VANCOREADY) IVPB 1500 mg/300 mL  Status:  Discontinued        1,500 mg 150 mL/hr over 120 Minutes Intravenous  Once 10/24/19 1835 10/24/19 1836   10/24/19 2000  vancomycin (VANCOREADY) IVPB 1500 mg/300 mL        1,500 mg 150 mL/hr over 120 Minutes Intravenous  Once 10/24/19 1836 10/24/19 2229   10/24/19 1730  piperacillin-tazobactam (ZOSYN) IVPB 3.375 g        3.375 g 100 mL/hr over 30 Minutes  Intravenous  Once 10/24/19 1721 10/24/19 1810       Assessment/Plan 1.POD 9, s/p LAPAROSCOPIC diverting loop ileostomy- 11/04/2019 - [facility] for complicated diverticulitis, possible malignancy, colovaginal fistula  -still have an undrained abscess in pelvis that is not amendable to perc drain and would've required an LAR at time of surgery.  Will repeat a CT scan today to get an idea of what this looks like currently.  Her vaginal drainage has decreased since surgery  -cont zosyn for now until we see what her scan looks like  -she is less nauseated today and wants to try some solid food.  There is no reason why she can't as her bag is working well.  Maybe this will help her reflux and nausea.  If this makes it worse, will back her off. -cont to mobilize/pulm toilet  2. COPD 3. History of seizures 4. Anxiety/depression 5. Anemia  Hgb - stable 6. Hypokalemia - improving  FEN - regular diet after CT, Boost VTE - Lovenox ID - zosyn 9/2 --> pending CT scan and findings   LOS: 20 days    Letha Cape , [facility] Surgery 11/13/2019, 10:42 AM Please see Amion for pager number during day hours 7:00am-4:30pm or 7:00am -11:30am on weekends  Agree with above. CT scan today shows SBO, decrease in size of gas and fluid collection. I reviewed her case with Dr. Cliffton Asters who did her original surgery.   He would be slow to reoperate on her, particularly in the view that her pelvic changes appear better. She did not have appendicitis when Dr. Cliffton Asters did her surgery and the findings on this CT scan are not really changed regarding the appendix.  Her NGT has already produced more than 2,000 cc.  And she feels better.  Ovidio Kin, MD, [facility] Surgery Office phone:  [phone]

## 2019-11-14 ENCOUNTER — Inpatient Hospital Stay (HOSPITAL_COMMUNITY): Payer: Self-pay

## 2019-11-14 ENCOUNTER — Inpatient Hospital Stay: Payer: Self-pay

## 2019-11-14 LAB — COMPREHENSIVE METABOLIC PANEL
ALT: 14 U/L (ref 0–44)
AST: 24 U/L (ref 15–41)
Albumin: 3.2 g/dL — ABNORMAL LOW (ref 3.5–5.0)
Alkaline Phosphatase: 107 U/L (ref 38–126)
Anion gap: 9 (ref 5–15)
BUN: <5 mg/dL — ABNORMAL LOW (ref 6–20)
CO2: 26 mmol/L (ref 22–32)
Calcium: 9.2 mg/dL (ref 8.9–10.3)
Chloride: 103 mmol/L (ref 98–111)
Creatinine, Ser: 0.74 mg/dL (ref 0.44–1.00)
GFR calc Af Amer: >60 mL/min (ref >60)
GFR calc non Af Amer: >60 mL/min (ref >60)
Glucose, Bld: 104 mg/dL — ABNORMAL HIGH (ref 70–99)
Potassium: 3.8 mmol/L (ref 3.5–5.1)
Sodium: 138 mmol/L (ref 135–145)
Total Bilirubin: 0.5 mg/dL (ref 0.3–1.2)
Total Protein: 6.9 g/dL (ref 6.5–8.1)

## 2019-11-14 LAB — GLUCOSE, CAPILLARY
Glucose-Capillary: 109 mg/dL — ABNORMAL HIGH (ref 70–99)
Glucose-Capillary: 90 mg/dL (ref 70–99)

## 2019-11-14 LAB — CREATININE, SERUM
Creatinine, Ser: 0.73 mg/dL (ref 0.44–1.00)
GFR calc Af Amer: >60 mL/min (ref >60)
GFR calc non Af Amer: >60 mL/min (ref >60)

## 2019-11-14 LAB — MAGNESIUM: Magnesium: 1.7 mg/dL (ref 1.7–2.4)

## 2019-11-14 LAB — PHOSPHORUS: Phosphorus: 4.3 mg/dL (ref 2.5–4.6)

## 2019-11-14 MED ORDER — SODIUM CHLORIDE 0.9% FLUSH
10.0000 mL | INTRAVENOUS | Status: DC | PRN
  Administered 2019-11-16 – 2019-11-22 (×2): 10 mL

## 2019-11-14 MED ORDER — INSULIN ASPART 100 UNIT/ML ~~LOC~~ SOLN
0.0000 [IU] | Freq: Four times a day (QID) | SUBCUTANEOUS | Status: DC
  Administered 2019-11-15 – 2019-11-16 (×2): 1 [IU] via SUBCUTANEOUS

## 2019-11-14 MED ORDER — MAGNESIUM SULFATE IN D5W 1-5 GM/100ML-% IV SOLN
1.0000 g | Freq: Once | INTRAVENOUS | Status: AC
  Administered 2019-11-14: 1 g via INTRAVENOUS
  Filled 2019-11-14: qty 100

## 2019-11-14 MED ORDER — OXYCODONE HCL 5 MG PO TABS
5.0000 mg | ORAL_TABLET | ORAL | Status: DC | PRN
  Administered 2019-11-14 – 2019-11-19 (×10): 10 mg via ORAL
  Filled 2019-11-14 (×11): qty 2

## 2019-11-14 MED ORDER — KCL IN DEXTROSE-NACL 20-5-0.45 MEQ/L-%-% IV SOLN
INTRAVENOUS | Status: AC
  Filled 2019-11-14: qty 1000

## 2019-11-14 MED ORDER — TRAVASOL 10 % IV SOLN
INTRAVENOUS | Status: AC
  Filled 2019-11-14: qty 470.4

## 2019-11-14 MED ORDER — CHLORHEXIDINE GLUCONATE CLOTH 2 % EX PADS
6.0000 | MEDICATED_PAD | Freq: Every day | CUTANEOUS | Status: DC
  Administered 2019-11-14 – 2019-11-27 (×14): 6 via TOPICAL

## 2019-11-14 MED ORDER — PIPERACILLIN-TAZOBACTAM 3.375 G IVPB
3.3750 g | Freq: Three times a day (TID) | INTRAVENOUS | Status: DC
  Administered 2019-11-14 – 2019-11-25 (×32): 3.375 g via INTRAVENOUS
  Filled 2019-11-14 (×34): qty 50

## 2019-11-14 NOTE — Plan of Care (Signed)
  Problem: Health Behavior/Discharge Planning: Goal: Ability to manage health-related needs will improve Outcome: Progressing   Problem: Clinical Measurements: Goal: Ability to maintain clinical measurements within normal limits will improve Outcome: Progressing Goal: Will remain free from infection Outcome: Progressing Goal: Diagnostic test results will improve Outcome: Progressing Goal: Respiratory complications will improve Outcome: Progressing Goal: Cardiovascular complication will be avoided Outcome: Progressing   Problem: Nutrition: Goal: Adequate nutrition will be maintained Outcome: Progressing   Problem: Pain Managment: Goal: General experience of comfort will improve Outcome: Progressing   Problem: Safety: Goal: Ability to remain free from injury will improve Outcome: Progressing   Problem: Clinical Measurements: Goal: Ability to maintain clinical measurements within normal limits will improve Outcome: Progressing Goal: Postoperative complications will be avoided or minimized Outcome: Progressing   Problem: Skin Integrity: Goal: Demonstration of wound healing without infection will improve Outcome: Progressing   Problem: Education: Goal: Knowledge of ostomy care will improve Outcome: Progressing Goal: Understanding of discharge needs will improve Outcome: Progressing   Problem: Bowel/Gastric/Urinary: Goal: Gastrointestinal status for postoperative course will improve Outcome: Progressing   Problem: Coping: Goal: Coping ability will improve Outcome: Progressing   Problem: Fluid Volume: Goal: Ability to achieve a balanced intake and output will improve Outcome: Progressing   Problem: Health Behavior/Discharge Planning: Goal: Ability to manage health-related needs will improve Outcome: Progressing   Problem: Nutrition: Goal: Will attain and maintain optimal nutritional status will improve Outcome: Progressing   Problem: Clinical  Measurements: Goal: Postoperative complications will be avoided or minimized Outcome: Progressing   Problem: Clinical Measurements: Goal: Postoperative complications will be avoided or minimized Outcome: Progressing   Problem: Skin Integrity: Goal: Will show signs of wound healing Outcome: Progressing Goal: Risk for impaired skin integrity will decrease Outcome: Progressing

## 2019-11-14 NOTE — Progress Notes (Signed)
Initial Nutrition Assessment  INTERVENTION:   -Recommend updated weight  -TPN management per Pharmacy -Will monitor for diet advancement  NUTRITION DIAGNOSIS:   Increased nutrient needs related to  (intra-abdominal abscess) as evidenced by estimated needs.  GOAL:   Patient will meet greater than or equal to 90% of their needs  MONITOR:   Labs, Weight trends, I & O's, Diet advancement (TPN)  REASON FOR ASSESSMENT:   Consult New TPN/TNA  ASSESSMENT:   54 year old lady prior history of hypertension, hyperlipidemia, anxiety, depression, seizure disorder, COPD presents on 9/2 with worsening abdominal pain.  CT of the abdomen pelvis showed several abscesses within the abdomen along with colovaginal fistula loss.  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  GI and general surgery were consulted.  Patient underwent flex sigmoidoscopy on 10/30/2019 showing diverticulosis in the rectosigmoid and sigmoid colon along with stenosis and narrowing.  GI recommended outpatient follow-up with her colonoscopy.  Repeat CT of the abdomen pelvis on 9/9 showed central loculated abscess within the pelvis increased in size and not amenable to percutaneous drainage given its location.  She underwent diagnostic laparotomy with laparoscopic diverting loop ileostomy on 11/04/2019.  MRI of the pelvis on 11/05/2019 demonstrated abscess/fistula cavity in the central pelvis containing air and fluid interspersed between the mid sigmoid colon and rectum measuring 3.7 into 3.1 cm suspicious for abscess.  9/2: admission 9/13: soft diet 9/19: CLD 9/22: regular ->NPO, NGT placed  Patient reports she has had very little to eat for 3 days now. States she did have eggs, bacon and toast, states she tolerated this ok. But then had episodes of vomiting. Pt currently NPO, with NGT set to LIS. Pt asking how long she will need  TPN.   Per PO documentation, PO has varied from 0-80% meal completions when on a solid diet.   Current weight is admission weight of 176 lbs obtained on 9/2. Recommend a new weight since pt has been here for 20 days now.   Per Pharmacy, TPN to be initiated at 40 ml/hr (providing 970 kcals and 47g protein).  I/Os: +18.6L since 9/9 NGT: 1.8L x 24 hrs Colostomy: 500 ml  Medications: D5 infusion, IV Mg sulfate Labs reviewed: Mg/Phos WNL  NUTRITION - FOCUSED PHYSICAL EXAM:  No depletions noted.  Diet Order:   Diet Order            Diet NPO time specified Except for: Ice Chips  Diet effective now                 EDUCATION NEEDS:   No education needs have been identified at this time  Skin:  Skin Assessment: Skin Integrity Issues: Skin Integrity Issues:: Incisions Incisions: 9/13 abdomen  Last BM:  9/23 -colostomy  Height:   Ht Readings from Last 1 Encounters:  10/24/19 5\' 8"  (1.727 m)    Weight:   Wt Readings from Last 1 Encounters:  10/24/19 78.5 kg   BMI:  Body mass index is 26.31 kg/m.  Estimated Nutritional Needs:   Kcal:  1900-2100  Protein:  95-105g  Fluid:  2L/day  12/24/19, MS, RD, LDN Inpatient Clinical Dietitian Contact information available via Amion

## 2019-11-14 NOTE — Progress Notes (Signed)
PHARMACY NOTE -  Zosyn  Pharmacy has been assisting with dosing of Zosyn for IAI.  Dosage remains stable at 3.375 g IV q8 hr and need for further dosage adjustment appears unlikely at present given SCr at baseline  Pharmacy will sign off, following peripherally for culture results or dose adjustments. Please reconsult if a change in clinical status warrants re-evaluation of dosage.  Bernadene Person, PharmD, BCPS [phone] 11/14/2019, 3:15 PM

## 2019-11-14 NOTE — Progress Notes (Signed)
PROGRESS NOTE    Susan Todd  WUJ:[id number] DOB: 14-Dec-1965 DOA: 10/24/2019 PCP: Georgann Housekeeper, MD    Chief Complaint  Patient presents with  . Abdominal Pain    Brief Narrative:  54 year old lady prior history of hypertension, hyperlipidemia, anxiety, depression, seizure disorder, COPD presents on 9/2 with worsening abdominal pain.  CT of the abdomen pelvis showed several abscesses within the abdomen along with colovaginal fistula loss.  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  GI and general surgery were consulted.  Patient underwent flex sigmoidoscopy on 10/30/2019 showing diverticulosis in the rectosigmoid and sigmoid colon along with stenosis and narrowing.  GI recommended outpatient follow-up with her colonoscopy.  Repeat CT of the abdomen pelvis on 9/9 showed central loculated abscess within the pelvis increased in size and not amenable to percutaneous drainage given its location.  She underwent diagnostic laparotomy with laparoscopic diverting loop ileostomy on 11/04/2019.  MRI of the pelvis on 11/05/2019 demonstrated abscess/fistula cavity in the central pelvis containing air and fluid interspersed between the mid sigmoid colon and rectum measuring 3.7 into 3.1 cm suspicious for abscess.   General surgery, on board, pt continues to have some nausea, and abd pain.  abd x ray unremarkable. CT abd and pelvis  is positive for small bowel obstruction. Transition point is identified within the right lower quadrant of the abdomen in the area of the inflamed appendix. She is made NPO and Ng tube placed. TPN started today.    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  Not amenable to IR drainage given its location. S/p laparotomy with laparoscopic diverting loop ileostomy. Patient has been on IV Zosyn from 10/24/2019 till 11/13/2019, general surgery wants to continue the IV antibiotics for persistent abscess .  Continue with Protonix and GI cocktail.   Patient continues to have abdominal pain and distention and abdominal x-rays are unremarkable was followed with a CT of the abdomen and pelvis showing small bowel obstruction with transition point in the right lower quadrant at the site of the appendix.  NG tube was placed to she was made n.p.o., general surgery on board and following. 3 lit of NG TUBE output int he last 24 hours.  abd distention has improved, nausea has improved. Plan for TPN after PICC placement today.    Anxiety and depression. Stable. On ativan prn.   COPD:  No wheezing heard.    Hypokalemia, hypomagnesemia  Replaced.    Hyperphosphatemia:  Improved.    Seizures:  Stable.   GERD" On PPI IV daily.   Anemia of chronic disease/Macrocytic anemia;  Anemia panel reviewed.     essential hypertension:  BP parameters are slightly elevated.  On IV metoprolol . And prn hydralazine will be added.     DVT prophylaxis: lovenox.  Code Status: full code.  Family Communication: (none at bedside) Disposition:   Status is: Inpatient  Remains inpatient appropriate because:Ongoing diagnostic testing needed not appropriate for outpatient work up, IV treatments appropriate due to intensity of illness or inability to take PO and Inpatient level of care appropriate due to severity of illness  Dispo: The patient is from: Home              Anticipated d/c is to: pending              Anticipated d/c date is: > 3 days              Patient currently is not medically stable to d/c.       Consultants:    General surgery  Procedures:  9/8-Flex sigmoidoscopy revealed diverticulosis in the rectosigmoid and sigmoid colon along with the stenosis and narrowing.  Biopsies from colon and rectum negative for inflammation or malignancy 9/13-diagnostic laparotomy with laparoscopic diverting loop ileostomy Antimicrobials:  Antibiotics Given (last 72 hours)    Date/Time Action Medication Dose Rate   11/11/19 1711 New Bag/Given   piperacillin-tazobactam (ZOSYN) IVPB 3.375 g 3.375 g 12.5 mL/hr   11/12/19 0143 New Bag/Given   piperacillin-tazobactam (ZOSYN) IVPB 3.375 g 3.375 g 12.5 mL/hr   11/12/19 1121 New Bag/Given   piperacillin-tazobactam (ZOSYN) IVPB 3.375 g 3.375 g 12.5 mL/hr   11/12/19 1741 New Bag/Given   piperacillin-tazobactam (ZOSYN) IVPB 3.375 g 3.375 g 12.5 mL/hr   11/13/19 0148 New Bag/Given   piperacillin-tazobactam (ZOSYN) IVPB 3.375 g 3.375 g 12.5 mL/hr   11/13/19 1030 New Bag/Given   piperacillin-tazobactam (ZOSYN) IVPB 3.375 g 3.375 g 12.5 mL/hr       Subjective: abd distention improved. Nausea is better.    Objective: Vitals:   11/13/19 0902 11/13/19 1413 11/13/19 2201 11/14/19 0557  BP: 118/84 133/79 (!) 149/86 134/76  Pulse: 78 67 68 75  Resp: 16 16 17 17   Temp:  98.6 F (37 C) 98.1 F (36.7 C) 98.4 F (36.9 C)  TempSrc:  Oral Oral Oral  SpO2: 94% 97% 94% 96%  Weight:      Height:        Intake/Output Summary (Last 24 hours) at 11/14/2019 1154 Last data filed at 11/14/2019 1000 Gross per 24 hour  Intake 1073.66 ml  Output 4100 ml  Net -3026.34 ml   Filed Weights   10/24/19 0751  Weight: 78.5 kg    Examination:  General exam: alert and comfortable. Not in distress has NG tube.  Respiratory system: clear to auscultation, no wheezing or rhonchi.  Cardiovascular system: S1 & S2 heard, RRR, no JVD, no pedal edema.  Gastrointestinal system: Abdomen is soft, distended, NG tube. Bowel sounds wnl.  Central nervous system: alert and oriented, grossly non  focal.  Extremities: no pedal edema.  Skin: No rashes seen.  Psychiatry:  Mood is appropriate.     Data Reviewed: I have personally reviewed following labs and imaging studies  CBC: Recent Labs  Lab 11/09/19 0434 11/11/19 0313 11/12/19 0357  WBC 7.5 6.9 6.8  HGB 8.6* 9.4* 9.1*  HCT 28.2* 30.6* 29.9*  MCV 100.7* 100.3* 100.7*  PLT 398 429* 424*    Basic Metabolic Panel: Recent Labs  Lab 11/09/19 0434 11/10/19 0320 11/11/19 0313 11/12/19 0357 11/13/19 0355 11/14/19 0320 11/14/19 0954  NA 139  --  141 141  --   --  138  K 3.6  --  3.2* 3.7  --   --  3.8  CL 106  --  102 102  --   --  103  CO2 24  --  28 29  --   --  26  GLUCOSE 92  --  119* 124*  --   --  104*  BUN 9  --  6 <5*  --   --  <  5*  CREATININE 0.82  0.88   < > 0.81 0.84 0.87 0.73 0.74  CALCIUM 8.8*  --  9.3 9.5  --   --  9.2  MG 1.6*  --  1.7 1.8  --   --  1.7  PHOS 5.2*  --  4.8*  --   --   --  4.3   < > = values in this interval not displayed.    GFR: Estimated Creatinine Clearance: 86.4 mL/min (by C-G formula based on SCr of 0.74 mg/dL).  Liver Function Tests: Recent Labs  Lab 11/09/19 0434 11/11/19 0313 11/12/19 0357 11/14/19 0954  AST  --   --  21 24  ALT  --   --  13 14  ALKPHOS  --   --  86 107  BILITOT  --   --  0.8 0.5  PROT  --   --  6.0* 6.9  ALBUMIN 2.5* 3.0* 2.9* 3.2*    CBG: No results for input(s): GLUCAP in the last 168 hours.   No results found for this or any previous visit (from the past 240 hour(s)).       Radiology Studies: CT ABDOMEN PELVIS W CONTRAST  Result Date: 11/13/2019 CLINICAL DATA:  Evaluate for intra-abdominal abscess EXAM: CT ABDOMEN AND PELVIS WITH CONTRAST TECHNIQUE: Multidetector CT imaging of the abdomen and pelvis was performed using the standard protocol following bolus administration of intravenous contrast. CONTRAST:  100mL OMNIPAQUE IOHEXOL 300 MG/ML  SOLN COMPARISON:  MR pelvis 11/05/2019 and CT AP 10/31/2019 FINDINGS: Lower chest:  Subsegmental atelectasis noted in the lung bases. No pleural effusion or airspace consolidation. Hepatobiliary: No suspicious liver abnormality. Focal fatty deposition identified along the falciform ligament. Gallbladder unremarkable. Pancreas: Unremarkable. No pancreatic ductal dilatation or surrounding inflammatory changes. Spleen: Normal in size without focal abnormality. Adrenals/Urinary Tract: Normal appearance of the adrenal glands. The kidneys are unremarkable. Bladder is unremarkable. No gas identified within the lumen of the bladder. Stomach/Bowel: There is marked diffuse distension of the stomach which is filled with fluid. The small bowel loops are diffusely dilated measuring up to 3.9 cm. Transition point is identified within the right lower quadrant of the abdomen in the area of the inflamed appendix, image 55/5. The distal small bowel loops appear decompressed to the level of the right abdominal ileostomy. Normal appearance of the colon. Thickened and inflamed appendix is again noted within the right lower quadrant of the abdomen measuring 1.2 cm with surrounding fat stranding. Previously noted gas and fluid collection adjacent to the tip of appendix has decreased in size from previous exam measuring 2.5 x 2.5 cm, image 74/2. Previously this measured 3.7 x 3.1 cm. Adjacent inflammation of the sigmoid colon (image 75/2) with small fistula tract (image 72/2) is again noted. Vascular/Lymphatic: Aortic atherosclerosis. Multiple prominent retroperitoneal lymph nodes are identified, likely reactive. Reproductive: Uterus is unremarkable. Fluid density structure within the right ovary is again noted measuring 3.3 cm, image 74/2. Unchanged. Other: Trace free fluid identified within the right lower quadrant of the abdomen surrounding the appendix and in the dependent portion of the pelvis. No new fluid collections identified. Musculoskeletal: Mild lumbar scoliosis with degenerative disc disease. IMPRESSION: 1.  Examination is positive for small bowel obstruction. Transition point is identified within the right lower quadrant of the abdomen in the area of the inflamed appendix. 2. Decrease in size of gas and fluid collection adjacent to the tip of appendix. 3. Similar appearance of inflammatory changes of the sigmoid colon with small  fistula tract. 4. Aortic atherosclerosis. Aortic Atherosclerosis (ICD10-I70.0). Emergent results were called by telephone at the time of interpretation on 11/13/2019 at 12:21 pm to provider [facility] , who verbally acknowledged these results. Electronically Signed   By: Signa Kell M.D.   On: 11/13/2019 12:22   [place] EKG SITE RITE  Result Date: 11/14/2019 If Site Rite image not attached, placement could not be confirmed due to current cardiac rhythm.       Scheduled Meds: . enoxaparin (LOVENOX) injection  40 mg Subcutaneous Q24H  . insulin aspart  0-9 Units Subcutaneous Q6H  . metoprolol tartrate  5 mg Intravenous Q8H  . nicotine  7 mg Transdermal Daily  . pantoprazole (PROTONIX) IV  40 mg Intravenous Q24H  . psyllium  1 packet Oral Daily  . sodium chloride flush  10-40 mL Intracatheter Q12H   Continuous Infusions: . dextrose 5 % and 0.45 % NaCl with KCl 20 mEq/L 75 mL/hr at 11/14/19 0600  . dextrose 5 % and 0.45 % NaCl with KCl 20 mEq/L    . lactated ringers    . magnesium sulfate bolus IVPB    . methocarbamol (ROBAXIN) IV Stopped (11/14/19 0537)  . TPN ADULT (ION)       LOS: 21 days        Kathlen Mody, MD [facility]   To contact the attending provider between 7A-7P or the covering provider during after hours 7P-7A, please log into the web site [email or web address] and access using universal [place] password for that web site. If you do not have the password, please call the hospital operator.  11/14/2019, 11:54 AM

## 2019-11-14 NOTE — Consult Note (Signed)
WOC Nurse ostomy follow up Stoma type/location: Ileostomy stoma is red and viable, 1 1/2 inches, above skin level.  Peristomal assessment: intact skin surrounding Output: small amt liquid green stool  Ostomy pouching: 2pc.  Education provided:  Current pouch is leaking behind the barrier.  Pt states she is opening and closing pouch and emptying without assistance. Applied 2 piece (23/4 inch) pouch and barrier ring to attempt to maintain a seal.  Educational materials left in room and 5 sets of barrier rings/wafers/pouches. Reviewed pouching routines, dietary precautions, importance of avoiding dehydration, and ordering supplies.  Pt verbalizes understanding.  Enrolled patient in [place] [employer] Discharge program: Yes, previously Cammie Mcgee MSN, RN, CWOCN, [place], CNS [phone]

## 2019-11-14 NOTE — Progress Notes (Addendum)
10 Days Post-Op  Subjective: Abdomen feels a lot better, but having a lot of pain from her NGT today.  Had over 3L out once placed yesterday.  Feels much less bloated.  ROS: See above, otherwise other systems negative  Objective: Vital signs in last 24 hours: Temp:  [98.1 F (36.7 C)-98.6 F (37 C)] 98.4 F (36.9 C) (09/23 0557) Pulse Rate:  [67-75] 75 (09/23 0557) Resp:  [16-17] 17 (09/23 0557) BP: (133-149)/(76-86) 134/76 (09/23 0557) SpO2:  [94 %-97 %] 96 % (09/23 0557) Last BM Date: 11/14/19  Intake/Output from previous day: 09/22 0701 - 09/23 0700 In: 1313.7 [P.O.:240; I.V.:897.9; IV Piggyback:175.8] Out: 4250 [Emesis/NG output:3800; Stool:450] Intake/Output this shift: Total I/O In: -  Out: 50 [Stool:50]  PE: Abd: much softer and less distended, +BS, ileostomy stoma is pink and viable with enteric contents present in bag.  NGT in place with bilious ouput  Lab Results:  Recent Labs    11/12/19 0357  WBC 6.8  HGB 9.1*  HCT 29.9*  PLT 424*   BMET Recent Labs    11/12/19 0357 11/13/19 0355 11/14/19 0320 11/14/19 0954  NA 141  --   --  138  K 3.7  --   --  3.8  CL 102  --   --  103  CO2 29  --   --  26  GLUCOSE 124*  --   --  104*  BUN <5*  --   --  <5*  CREATININE 0.84   < > 0.73 0.74  CALCIUM 9.5  --   --  9.2   < > = values in this interval not displayed.   PT/INR No results for input(s): LABPROT, INR in the last 72 hours. CMP     Component Value Date/Time   NA 138 11/14/2019 0954   K 3.8 11/14/2019 0954   CL 103 11/14/2019 0954   CO2 26 11/14/2019 0954   GLUCOSE 104 (H) 11/14/2019 0954   BUN <5 (L) 11/14/2019 0954   CREATININE 0.74 11/14/2019 0954   CREATININE 0.68 10/14/2010 1059   CALCIUM 9.2 11/14/2019 0954   PROT 6.9 11/14/2019 0954   ALBUMIN 3.2 (L) 11/14/2019 0954   AST 24 11/14/2019 0954   ALT 14 11/14/2019 0954   ALKPHOS 107 11/14/2019 0954   BILITOT 0.5 11/14/2019 0954   GFRNONAA >60 11/14/2019 0954   GFRAA >60  11/14/2019 0954   Lipase     Component Value Date/Time   LIPASE 18 09/19/2019 1459       Studies/Results: CT ABDOMEN PELVIS W CONTRAST  Result Date: 11/13/2019 CLINICAL DATA:  Evaluate for intra-abdominal abscess EXAM: CT ABDOMEN AND PELVIS WITH CONTRAST TECHNIQUE: Multidetector CT imaging of the abdomen and pelvis was performed using the standard protocol following bolus administration of intravenous contrast. CONTRAST:  OMNIPAQUE IOHEXOL 300 MG/ML  SOLN COMPARISON:  MR pelvis 11/05/2019 and CT AP 10/31/2019 FINDINGS: Lower chest: Subsegmental atelectasis noted in the lung bases. No pleural effusion or airspace consolidation. Hepatobiliary: No suspicious liver abnormality. Focal fatty deposition identified along the falciform ligament. Gallbladder unremarkable. Pancreas: Unremarkable. No pancreatic ductal dilatation or surrounding inflammatory changes. Spleen: Normal in size without focal abnormality. Adrenals/Urinary Tract: Normal appearance of the adrenal glands. The kidneys are unremarkable. Bladder is unremarkable. No gas identified within the lumen of the bladder. Stomach/Bowel: There is marked diffuse distension of the stomach which is filled with fluid. The small bowel loops are diffusely dilated measuring up to 3.9 cm. Transition point is  identified within the right lower quadrant of the abdomen in the area of the inflamed appendix, image 55/5. The distal small bowel loops appear decompressed to the level of the right abdominal ileostomy. Normal appearance of the colon. Thickened and inflamed appendix is again noted within the right lower quadrant of the abdomen measuring 1.2 cm with surrounding fat stranding. Previously noted gas and fluid collection adjacent to the tip of appendix has decreased in size from previous exam measuring 2.5 x 2.5 cm, image 74/2. Previously this measured 3.7 x 3.1 cm. Adjacent inflammation of the sigmoid colon (image 75/2) with small fistula tract (image  72/2) is again noted. Vascular/Lymphatic: Aortic atherosclerosis. Multiple prominent retroperitoneal lymph nodes are identified, likely reactive. Reproductive: Uterus is unremarkable. Fluid density structure within the right ovary is again noted measuring 3.3 cm, image 74/2. Unchanged. Other: Trace free fluid identified within the right lower quadrant of the abdomen surrounding the appendix and in the dependent portion of the pelvis. No new fluid collections identified. Musculoskeletal: Mild lumbar scoliosis with degenerative disc disease. IMPRESSION: 1. Examination is positive for small bowel obstruction. Transition point is identified within the right lower quadrant of the abdomen in the area of the inflamed appendix. 2. Decrease in size of gas and fluid collection adjacent to the tip of appendix. 3. Similar appearance of inflammatory changes of the sigmoid colon with small fistula tract. 4. Aortic atherosclerosis. Aortic Atherosclerosis (ICD10-I70.0). Emergent results were called by telephone at the time of interpretation on 11/13/2019 at 12:21 pm to provider [facility] , who verbally acknowledged these results. Electronically Signed   By: Signa Kell M.D.   On: 11/13/2019 12:22   [place] EKG SITE RITE  Result Date: 11/14/2019 If Site Rite image not attached, placement could not be confirmed due to current cardiac rhythm.   Anti-infectives: Anti-infectives (From admission, onward)   Start     Dose/Rate Route Frequency Ordered Stop   11/03/19 0845  cefoTEtan (CEFOTAN) 2 g in sodium chloride 0.9 % 100 mL IVPB  Status:  Discontinued        2 g 200 mL/hr over 30 Minutes Intravenous On call to O.R. 11/03/19 0831 11/03/19 0842   10/25/19 1000  vancomycin (VANCOCIN) IVPB 1000 mg/200 mL premix  Status:  Discontinued        1,000 mg 200 mL/hr over 60 Minutes Intravenous Every 12 hours 10/24/19 1835 10/28/19 1727   10/24/19 2300  piperacillin-tazobactam (ZOSYN) IVPB 3.375 g  Status:  Discontinued         3.375 g 12.5 mL/hr over 240 Minutes Intravenous Every 8 hours 10/24/19 1835 11/13/19 1557   10/24/19 2000  vancomycin (VANCOREADY) IVPB 1500 mg/300 mL  Status:  Discontinued        1,500 mg 150 mL/hr over 120 Minutes Intravenous  Once 10/24/19 1835 10/24/19 1836   10/24/19 2000  vancomycin (VANCOREADY) IVPB 1500 mg/300 mL        1,500 mg 150 mL/hr over 120 Minutes Intravenous  Once 10/24/19 1836 10/24/19 2229   10/24/19 1730  piperacillin-tazobactam (ZOSYN) IVPB 3.375 g        3.375 g 100 mL/hr over 30 Minutes Intravenous  Once 10/24/19 1721 10/24/19 1810       Assessment/Plan 1.POD 10, s/p LAPAROSCOPIC diverting loop ileostomy- 11/04/2019 - [place] for complicated diverticulitis, possible malignancy, colovaginal fistula             -undrained abscess in abdomen is improving on her follow up CT scan yesterday.  -unfortunately, she still has  a lot of inflammatory changes in her RLQ causing at least a partial blockage of her intestines causing significant back up of enteric contents.   NGT placed yesterday with over 3L out so far.  Will leave this at least for the next several days to hopefully allow this inflammation to resolve and her obstruction to resolve as well.  Ideally would like to avoid a repeat operation at this point given the amount of inflammation going on in her abdomen  -prealbumin was 25 on Tuesday, but given she is no longer able to take Boost, will place a PICC today and start TNA.  -colovaginal fistula still present but improving as drainage is less after surgery  2. COPD 3. History of seizures 4. Anxiety/depression 5. Anemia  Hgb -stable 6. Hypokalemia- improving  FEN -NPO/NGT/IVFs VTE -Lovenox ID -zosyn 9/2 --> continue due to persistent abscess in pelvis, although improving   LOS: 21 days    Letha Cape , [facility] Surgery 11/14/2019, 11:55 AM Please see Amion for pager number during day hours 7:00am-4:30pm or 7:00am  -11:30am on weekends  Agree with above. She has put more out of her ostomy. She has not felt like walking today.  Ovidio Kin, MD, [facility] Surgery Office phone:  [phone]

## 2019-11-14 NOTE — Progress Notes (Signed)
PHARMACY - TOTAL PARENTERAL NUTRITION CONSULT NOTE   Indication: Prolonged ileus, SBO  Patient Measurements: Height: 5\' 8"  (172.7 cm) Weight: 78.5 kg (173 lb 1 oz) IBW/kg (Calculated) : 63.9 TPN AdjBW (KG): 78.5 Body mass index is 26.31 kg/m. Usual Weight:   Assessment: Patient is a 54 y.o F who was instructed by her gastroenterologist to come to the ED on 9/2 for treatment of intra-abdominal abscesses.  She underwent flex sigmoidoscopy on 9/8 with biopsies taken and noted diverticular disease.  She subsequently underwent lap diverting loop ileostomy on 9/13.  Abdominal CT on 9/22 showed SBO.  Pharmacy is consulted on 9/23 to start TPN.  Glucose / Insulin: cbg 104 from cmet; not currently on insulin Electrolytes: K 3.8, mag 1.7; other lytes wnl Renal: scr wnl; BUN <5 LFTs / TGs: LFTs wnl - TG pending Prealbumin / albumin: albumin 3.2; pre-albumin pending Intake / Output; MIVF:  - I/O -2936 - D5 0.45NS with 20 KCL at 75 ml/hr GI Imaging: - 9/1 abd CT: Enlarging cystic, fluid containing area in the RIGHT adnexa may represent worsening of abscess adjacent to or involving the ovary. 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 a centrally located abscess within the pelvis which has demonstrated interval increase in size. - 9/14 pelvis MRI: There is a redemonstrated abscess or fistula cavity in the central pelvis containing air and fluid interposed between the mid sigmoid colon and rectum. Findings are again suspicious of abscess or fistula related to prior tip appendicitis or alternately diverticulitis and not significantly changed compared to prior examinations. The presence of air and fluid in the vagina is again highly suspicious for  vaginal fistula. - 9/22 abd CT: positive for small bowel obstruction Surgeries / Procedures:  - 9/8 flex sigmoidoscopy: biopsies taken and noted diverticular disease - 9/13: lap diverting loop ileostomy on 9/13  Significant events: - 9/22: NGT placement; NPO  Central access: pending PICC placement (ordered for 9/23) TPN start date: pending PICC placement  Nutritional Goals (per RD recommendation on 9/23): kCal: 1900-2100, Protein: 95-105, Fluid: 2L/day  Goal TPN rate is 85 mL/hr (provides 100 g of protein and 2060 kcals per day)  Current Nutrition:  - BOOST TID from 9/9 to 9/22 - currently NPO  Plan:   Now:  - magnesium sulfate 1gm IV x1  If able to place PICC, then at 1800:  - Start TPN at 40 mL/hr at 1800 - Electrolytes in TPN: 34mEq/L of Na, 74mEq/L of K, 9mEq/L of Ca, 85mEq/L of Mg, and 18mmol/L of Phos. Cl:Ac ratio 1:1 - Add standard MVI and trace elements to TPN - Initiate Sensitive q6h SSI and adjust as needed  - Reduce MIVF to 30 mL/hr at 1800 - Monitor TPN labs on Mon/Thurs - CMET, phos, mag on 9/24  Pham, Anh P 11/14/2019,7:36 AM

## 2019-11-15 LAB — CBC
HCT: 29.6 % — ABNORMAL LOW (ref 36.0–46.0)
Hemoglobin: 9.3 g/dL — ABNORMAL LOW (ref 12.0–15.0)
MCH: 31.3 pg (ref 26.0–34.0)
MCHC: 31.4 g/dL (ref 30.0–36.0)
MCV: 99.7 fL (ref 80.0–100.0)
Platelets: 469 10*3/uL — ABNORMAL HIGH (ref 150–400)
RBC: 2.97 MIL/uL — ABNORMAL LOW (ref 3.87–5.11)
RDW: 14.7 % (ref 11.5–15.5)
WBC: 5.9 10*3/uL (ref 4.0–10.5)
nRBC: 0 % (ref 0.0–0.2)

## 2019-11-15 LAB — COMPREHENSIVE METABOLIC PANEL
ALT: 12 U/L (ref 0–44)
AST: 20 U/L (ref 15–41)
Albumin: 2.8 g/dL — ABNORMAL LOW (ref 3.5–5.0)
Alkaline Phosphatase: 90 U/L (ref 38–126)
Anion gap: 9 (ref 5–15)
BUN: <5 mg/dL — ABNORMAL LOW (ref 6–20)
CO2: 24 mmol/L (ref 22–32)
Calcium: 8.9 mg/dL (ref 8.9–10.3)
Chloride: 106 mmol/L (ref 98–111)
Creatinine, Ser: 0.62 mg/dL (ref 0.44–1.00)
GFR calc Af Amer: >60 mL/min (ref >60)
GFR calc non Af Amer: >60 mL/min (ref >60)
Glucose, Bld: 113 mg/dL — ABNORMAL HIGH (ref 70–99)
Potassium: 3.4 mmol/L — ABNORMAL LOW (ref 3.5–5.1)
Sodium: 139 mmol/L (ref 135–145)
Total Bilirubin: 0.4 mg/dL (ref 0.3–1.2)
Total Protein: 6 g/dL — ABNORMAL LOW (ref 6.5–8.1)

## 2019-11-15 LAB — DIFFERENTIAL
Abs Immature Granulocytes: 0.02 K/uL (ref 0.00–0.07)
Basophils Absolute: 0 K/uL (ref 0.0–0.1)
Basophils Relative: 0 %
Eosinophils Absolute: 0.3 K/uL (ref 0.0–0.5)
Eosinophils Relative: 4 %
Immature Granulocytes: 0 %
Lymphocytes Relative: 23 %
Lymphs Abs: 1.4 K/uL (ref 0.7–4.0)
Monocytes Absolute: 0.6 K/uL (ref 0.1–1.0)
Monocytes Relative: 10 %
Neutro Abs: 3.7 K/uL (ref 1.7–7.7)
Neutrophils Relative %: 63 %

## 2019-11-15 LAB — GLUCOSE, CAPILLARY
Glucose-Capillary: 119 mg/dL — ABNORMAL HIGH (ref 70–99)
Glucose-Capillary: 108 mg/dL — ABNORMAL HIGH (ref 70–99)
Glucose-Capillary: 129 mg/dL — ABNORMAL HIGH (ref 70–99)

## 2019-11-15 LAB — PREALBUMIN: Prealbumin: 18.7 mg/dL (ref 18–38)

## 2019-11-15 LAB — MAGNESIUM: Magnesium: 1.7 mg/dL (ref 1.7–2.4)

## 2019-11-15 LAB — TRIGLYCERIDES: Triglycerides: 199 mg/dL — ABNORMAL HIGH (ref <150)

## 2019-11-15 LAB — PHOSPHORUS: Phosphorus: 4 mg/dL (ref 2.5–4.6)

## 2019-11-15 MED ORDER — HYDRALAZINE HCL 20 MG/ML IJ SOLN: 5.0000 mg | Freq: Three times a day (TID) | INTRAMUSCULAR | Status: DC | PRN

## 2019-11-15 MED ORDER — POTASSIUM CHLORIDE 10 MEQ/100ML IV SOLN
10.0000 meq | INTRAVENOUS | Status: AC
  Administered 2019-11-15 (×2): 10 meq via INTRAVENOUS
  Filled 2019-11-15 (×2): qty 100

## 2019-11-15 MED ORDER — TRAVASOL 10 % IV SOLN
INTRAVENOUS | Status: AC
  Filled 2019-11-15: qty 705.6

## 2019-11-15 MED ORDER — MAGNESIUM SULFATE IN D5W 1-5 GM/100ML-% IV SOLN
1.0000 g | Freq: Once | INTRAVENOUS | Status: AC
  Administered 2019-11-15: 1 g via INTRAVENOUS
  Filled 2019-11-15: qty 100

## 2019-11-15 NOTE — Progress Notes (Signed)
PHARMACY - TOTAL PARENTERAL NUTRITION CONSULT NOTE   Indication: Prolonged ileus, SBO  Patient Measurements: Height: 5\' 8"  (172.7 cm) Weight: 78.5 kg (173 lb 1 oz) IBW/kg (Calculated) : 63.9 TPN AdjBW (KG): 78.5 Body mass index is 26.31 kg/m.  Assessment: Patient is a 54 y.o F who was instructed by her gastroenterologist to come to the ED on 9/2 for treatment of intra-abdominal abscesses.  She underwent flex sigmoidoscopy on 9/8 with biopsies taken and noted diverticular disease.  She subsequently underwent lap diverting loop ileostomy on 9/13.  Abdominal CT on 9/22 showed SBO.  Pharmacy is consulted on 9/23 to start TPN.  Glucose / Insulin: CBGs controlled; on sensitive insulin correction coverage Electrolytes: K down to 3.4, mag 1.7; other lytes wnl Renal: scr wnl; BUN <5 LFTs / TGs: LFTs wnl, TG 199 Prealbumin / albumin: albumin 2.8; pre-albumin 18.7 Intake / Output; MIVF:  - I/O -925.6 - D5 0.45NS with 20 KCL at 30 ml/hr GI Imaging: - 9/1 abd CT: Enlarging cystic, fluid containing area in the RIGHT adnexa may represent worsening of abscess adjacent to or involving the ovary. 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 a centrally located abscess within the pelvis which has demonstrated interval increase in size. - 9/14 pelvis MRI: There is a redemonstrated abscess or fistula cavity in the central pelvis containing air and fluid interposed between the mid sigmoid colon and rectum. Findings are again suspicious of abscess or fistula related to prior tip appendicitis or alternately diverticulitis and not significantly changed compared to prior examinations. The presence of air and fluid in the vagina is again highly suspicious for  vaginal fistula. - 9/22 abd CT: positive for small bowel obstruction Surgeries / Procedures:  - 9/8 flex sigmoidoscopy: biopsies taken and noted diverticular disease - 9/13: lap diverting loop ileostomy on 9/13  Significant events: - 9/22: NGT placement; NPO  Central access: PICC placed 9/23 TPN start date: 11/14/2019  Nutritional Goals (per RD recommendation on 9/23): kCal: 1900-2100, Protein: 95-105, Fluid: 2L/day  Goal TPN rate is 85 mL/hr (provides 100 g of protein and 2060 kcals per day)  Current Nutrition:  - BOOST TID from 9/9 to 9/22 - currently NPO  Plan:  - Magnesium 1 g IVPB x 1 this am - KCl 10 meq IVPB q1h x 4 this am - Increase TPN to 60 ml/hr at 1800 - Electrolytes in TPN: 4mEq/L of Na, 18mEq/L of K, 57mEq/L of Ca, 30mEq/L of Mg, and 27mmol/L of Phos. Cl:Ac ratio 1:1 - Add standard MVI and trace elements to TPN - Continue Sensitive q6h SSI and adjust as needed  - Discontinue MIVF at 1800 - Monitor TPN labs on Mon/Thurs - CMET, phos, mag on 9/25  10/25, PharmD, BCPS 11/15/2019,8:37 AM

## 2019-11-15 NOTE — Progress Notes (Addendum)
11 Days Post-Op  Subjective: No new issues.  Still having ileostomy output, but has not increased.  Feeling better today overall  ROS: See above, otherwise other systems negative  Objective: Vital signs in last 24 hours: Temp:  [98.5 F (36.9 C)-98.7 F (37.1 C)] 98.5 F (36.9 C) (09/24 0451) Pulse Rate:  [72-75] 72 (09/24 0451) Resp:  [16-18] 16 (09/24 0451) BP: (139-159)/(80-88) 159/80 (09/24 0451) SpO2:  [92 %-97 %] 97 % (09/24 0451) Last BM Date: 11/14/19  Intake/Output from previous day: 09/23 0701 - 09/24 0700 In: 924.4 [P.O.:260; I.V.:561.1; IV Piggyback:103.3] Out: 1750 [Urine:600; Emesis/NG output:850; Stool:300] Intake/Output this shift: No intake/output data recorded.  PE: Abd: softer, ileostomy with output, but about 250 or so yesterday.  NGT with 900cc yesterday.  Still with bilious output today  Lab Results:  Recent Labs    11/15/19 0345  WBC 5.9  HGB 9.3*  HCT 29.6*  PLT 469*   BMET Recent Labs    11/14/19 0954 11/15/19 0345  NA 138 139  K 3.8 3.4*  CL 103 106  CO2 26 24  GLUCOSE 104* 113*  BUN <5* <5*  CREATININE 0.74 0.62  CALCIUM 9.2 8.9   PT/INR No results for input(s): LABPROT, INR in the last 72 hours. CMP     Component Value Date/Time   NA 139 11/15/2019 0345   K 3.4 (L) 11/15/2019 0345   CL 106 11/15/2019 0345   CO2 24 11/15/2019 0345   GLUCOSE 113 (H) 11/15/2019 0345   BUN <5 (L) 11/15/2019 0345   CREATININE 0.62 11/15/2019 0345   CREATININE 0.68 10/14/2010 1059   CALCIUM 8.9 11/15/2019 0345   PROT 6.0 (L) 11/15/2019 0345   ALBUMIN 2.8 (L) 11/15/2019 0345   AST 20 11/15/2019 0345   ALT 12 11/15/2019 0345   ALKPHOS 90 11/15/2019 0345   BILITOT 0.4 11/15/2019 0345   GFRNONAA >60 11/15/2019 0345   GFRAA >60 11/15/2019 0345   Lipase     Component Value Date/Time   LIPASE 18 09/19/2019 1459       Studies/Results: CT ABDOMEN PELVIS W CONTRAST  Result Date: 11/13/2019 CLINICAL DATA:  Evaluate for  intra-abdominal abscess EXAM: CT ABDOMEN AND PELVIS WITH CONTRAST TECHNIQUE: Multidetector CT imaging of the abdomen and pelvis was performed using the standard protocol following bolus administration of intravenous contrast. CONTRAST:  OMNIPAQUE IOHEXOL 300 MG/ML  SOLN COMPARISON:  MR pelvis 11/05/2019 and CT AP 10/31/2019 FINDINGS: Lower chest: Subsegmental atelectasis noted in the lung bases. No pleural effusion or airspace consolidation. Hepatobiliary: No suspicious liver abnormality. Focal fatty deposition identified along the falciform ligament. Gallbladder unremarkable. Pancreas: Unremarkable. No pancreatic ductal dilatation or surrounding inflammatory changes. Spleen: Normal in size without focal abnormality. Adrenals/Urinary Tract: Normal appearance of the adrenal glands. The kidneys are unremarkable. Bladder is unremarkable. No gas identified within the lumen of the bladder. Stomach/Bowel: There is marked diffuse distension of the stomach which is filled with fluid. The small bowel loops are diffusely dilated measuring up to 3.9 cm. Transition point is identified within the right lower quadrant of the abdomen in the area of the inflamed appendix, image 55/5. The distal small bowel loops appear decompressed to the level of the right abdominal ileostomy. Normal appearance of the colon. Thickened and inflamed appendix is again noted within the right lower quadrant of the abdomen measuring 1.2 cm with surrounding fat stranding. Previously noted gas and fluid collection adjacent to the tip of appendix has decreased in size from previous  exam measuring 2.5 x 2.5 cm, image 74/2. Previously this measured 3.7 x 3.1 cm. Adjacent inflammation of the sigmoid colon (image 75/2) with small fistula tract (image 72/2) is again noted. Vascular/Lymphatic: Aortic atherosclerosis. Multiple prominent retroperitoneal lymph nodes are identified, likely reactive. Reproductive: Uterus is unremarkable. Fluid density structure  within the right ovary is again noted measuring 3.3 cm, image 74/2. Unchanged. Other: Trace free fluid identified within the right lower quadrant of the abdomen surrounding the appendix and in the dependent portion of the pelvis. No new fluid collections identified. Musculoskeletal: Mild lumbar scoliosis with degenerative disc disease. IMPRESSION: 1. Examination is positive for small bowel obstruction. Transition point is identified within the right lower quadrant of the abdomen in the area of the inflamed appendix. 2. Decrease in size of gas and fluid collection adjacent to the tip of appendix. 3. Similar appearance of inflammatory changes of the sigmoid colon with small fistula tract. 4. Aortic atherosclerosis. Aortic Atherosclerosis (ICD10-I70.0). Emergent results were called by telephone at the time of interpretation on 11/13/2019 at 12:21 pm to provider [facility] , who verbally acknowledged these results. Electronically Signed   By: Signa Kell M.D.   On: 11/13/2019 12:22   DG CHEST PORT 1 VIEW  Result Date: 11/14/2019 CLINICAL DATA:  Status post PICC line placement EXAM: PORTABLE CHEST 1 VIEW COMPARISON:  August 10, 2018 FINDINGS: Right-sided PICC line is identified with distal tip in the superior vena cava. There is no pneumothorax. The mediastinal contour and cardiac silhouette are normal. The lungs are clear. Bony structures are normal. IMPRESSION: Right-sided PICC line distal tip in the superior vena cava. No pneumothorax. Electronically Signed   By: Sherian Rein M.D.   On: 11/14/2019 14:13   [place] EKG SITE RITE  Result Date: 11/14/2019 If Site Rite image not attached, placement could not be confirmed due to current cardiac rhythm.   Anti-infectives: Anti-infectives (From admission, onward)   Start     Dose/Rate Route Frequency Ordered Stop   11/14/19 1600  piperacillin-tazobactam (ZOSYN) IVPB 3.375 g        3.375 g 12.5 mL/hr over 240 Minutes Intravenous Every 8 hours 11/14/19 1514      11/03/19 0845  cefoTEtan (CEFOTAN) 2 g in sodium chloride 0.9 % 100 mL IVPB  Status:  Discontinued        2 g 200 mL/hr over 30 Minutes Intravenous On call to O.R. 11/03/19 0831 11/03/19 0842   10/25/19 1000  vancomycin (VANCOCIN) IVPB 1000 mg/200 mL premix  Status:  Discontinued        1,000 mg 200 mL/hr over 60 Minutes Intravenous Every 12 hours 10/24/19 1835 10/28/19 1727   10/24/19 2300  piperacillin-tazobactam (ZOSYN) IVPB 3.375 g  Status:  Discontinued        3.375 g 12.5 mL/hr over 240 Minutes Intravenous Every 8 hours 10/24/19 1835 11/13/19 1557   10/24/19 2000  vancomycin (VANCOREADY) IVPB 1500 mg/300 mL  Status:  Discontinued        1,500 mg 150 mL/hr over 120 Minutes Intravenous  Once 10/24/19 1835 10/24/19 1836   10/24/19 2000  vancomycin (VANCOREADY) IVPB 1500 mg/300 mL        1,500 mg 150 mL/hr over 120 Minutes Intravenous  Once 10/24/19 1836 10/24/19 2229   10/24/19 1730  piperacillin-tazobactam (ZOSYN) IVPB 3.375 g        3.375 g 100 mL/hr over 30 Minutes Intravenous  Once 10/24/19 1721 10/24/19 1810       Assessment/Plan 1.POD 11, s/pLAPAROSCOPIC  diverting loop ileostomy- 11/04/2019 - [place] complicated diverticulitis, possible malignancy, colovaginal fistula -undrained abscess in abdomen is improving on her follow up CT scan yesterday.             -unfortunately, she still has a lot of inflammatory changes in her RLQ causing at least a partial blockage of her intestines causing significant back up of enteric contents.   cont NGT.  Will leave this at least for the next several days to hopefully allow this inflammation to resolve and her obstruction to resolve as well.  Ideally would like to avoid a repeat operation at this point given the amount of inflammation going on in her abdomen             -PICC and TNA while NPO             -colovaginal fistula still present but improving as drainage is less after surgery  2. COPD 3. History of seizures 4.  Anxiety/depression 5. Anemia  Hgb -stable 6. Hypokalemia-improving  FEN -NPO/NGT/IVFs/TNA VTE -Lovenox ID -zosyn 9/2 -->continue due to persistent abscess in pelvis, although improving   LOS: 22 days    Letha Cape , [facility] 11/15/2019, 9:34 AM Please see Amion for pager number during day hours 7:00am-4:30pm or 7:00am -11:30am on weekends  Agree with above. Clinically better, but would like to see more out of her ileostomy.  Ovidio Kin, MD, [facility] Office phone:  [phone]

## 2019-11-15 NOTE — Progress Notes (Signed)
PROGRESS NOTE    Susan Todd  ZOX:[id number] DOB: 08/23/1965 DOA: 10/24/2019 PCP: Georgann HousekeeperHusain, Karrar, MD    Chief Complaint  Patient presents with  . Abdominal Pain    Brief Narrative:  54 year old lady prior history of hypertension, hyperlipidemia, anxiety, depression, seizure disorder, COPD presents on 9/2 with worsening abdominal pain.  CT of the abdomen pelvis showed several abscesses within the abdomen along with colovaginal fistula loss.  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  GI and general surgery were consulted.  Patient underwent flex sigmoidoscopy on 10/30/2019 showing diverticulosis in the rectosigmoid and sigmoid colon along with stenosis and narrowing.  GI recommended outpatient follow-up with her colonoscopy.  Repeat CT of the abdomen pelvis on 9/9 showed central loculated abscess within the pelvis increased in size and not amenable to percutaneous drainage given its location.  She underwent diagnostic laparotomy with laparoscopic diverting loop ileostomy on 11/04/2019.  MRI of the pelvis on 11/05/2019 demonstrated abscess/fistula cavity in the central pelvis containing air and fluid interspersed between the mid sigmoid colon and rectum measuring 3.7 into 3.1 cm suspicious for abscess.   General surgery, on board, pt continues to have some nausea, and abd pain.  abd x ray unremarkable. CT abd and pelvis  is positive for small bowel obstruction. Transition point is identified within the right lower quadrant of the abdomen in the area of the inflamed appendix. She is made NPO and Ng tube placed. TPN started today.  Pt seen and examined at bedside, reports she is miserable from the NG TUBE.     Assessment & Plan:   Principal Problem:   Intra-abdominal abscess (HCC) Active Problems:   Depression   Anxiety   Seizures (HCC)   COPD (chronic obstructive pulmonary disease)  (HCC)   Seizure (HCC)   Sepsis secondary to intra-abdominal/pelvic abscess from suspected diverticulitis with colovaginal fistula Initial CT of the abdomen pelvis and MRI pelvis revealed multiple abscesses.  Not amenable to IR drainage given its location. S/p laparotomy with laparoscopic diverting loop ileostomy. Patient has been on IV Zosyn from 10/24/2019 till 11/13/2019, general surgery wants to continue the IV antibiotics for persistent abscess .  Continue with Protonix and GI cocktail.   Patient continues to have abdominal pain and distention and abdominal x-rays are unremarkable was followed with a CT of the abdomen and pelvis showing small bowel obstruction with transition point in the right lower quadrant at the site of the appendix.  NG tube was placed ,  general surgery on board and following. abd distention has improved, nausea has improved, able to tolerate clear liquids.  TPN while NPO.     Anxiety and depression. Stable. On ativan prn.   COPD:  No wheezing heard.    Hypokalemia, hypomagnesemia  Replaced.    Hyperphosphatemia:  Improved.    Seizures:  Stable.   GERD" On PPI IV daily.   Anemia of chronic disease/Macrocytic anemia;  Anemia panel reviewed.     essential hypertension:  BP parameters are slightly elevated.  On IV metoprolol, prn hydralazine.     DVT prophylaxis: lovenox.  Code Status: full code.  Family Communication: (none at bedside) Disposition:   Status is: Inpatient  Remains inpatient appropriate because:Ongoing diagnostic testing needed not appropriate for outpatient work up, IV treatments appropriate due to intensity of illness or inability to take PO and Inpatient level of care appropriate due to severity of illness   Dispo:  The patient is from: Home              Anticipated d/c is to: pending              Anticipated d/c date is: > 3 days              Patient currently is not medically stable to d/c.       Consultants:    General surgery  Procedures:  9/8-Flex sigmoidoscopy revealed diverticulosis in the rectosigmoid and sigmoid colon along with the stenosis and narrowing.  Biopsies from colon and rectum negative for inflammation or malignancy 9/13-diagnostic laparotomy with laparoscopic diverting loop ileostomy Antimicrobials:  Antibiotics Given (last 72 hours)    Date/Time Action Medication Dose Rate   11/12/19 1741 New Bag/Given   piperacillin-tazobactam (ZOSYN) IVPB 3.375 g 3.375 g 12.5 mL/hr   11/13/19 0148 New Bag/Given   piperacillin-tazobactam (ZOSYN) IVPB 3.375 g 3.375 g 12.5 mL/hr   11/13/19 1030 New Bag/Given   piperacillin-tazobactam (ZOSYN) IVPB 3.375 g 3.375 g 12.5 mL/hr   11/14/19 1634 New Bag/Given   piperacillin-tazobactam (ZOSYN) IVPB 3.375 g 3.375 g 12.5 mL/hr   11/14/19 2323 New Bag/Given   piperacillin-tazobactam (ZOSYN) IVPB 3.375 g 3.375 g 12.5 mL/hr   11/15/19 0610 New Bag/Given   piperacillin-tazobactam (ZOSYN) IVPB 3.375 g 3.375 g 12.5 mL/hr       Subjective: Wants to know if she can drink juice.    Objective: Vitals:   11/14/19 0557 11/14/19 1235 11/14/19 2023 11/15/19 0451  BP: 134/76 (!) 157/88 139/82 (!) 159/80  Pulse: 75 75 72 72  Resp: 17 17 18 16   Temp: 98.4 F (36.9 C) 98.7 F (37.1 C) 98.7 F (37.1 C) 98.5 F (36.9 C)  TempSrc: Oral Oral Oral Oral  SpO2: 96% 92% 94% 97%  Weight:      Height:        Intake/Output Summary (Last 24 hours) at 11/15/2019 1355 Last data filed at 11/15/2019 1258 Gross per 24 hour  Intake 1344.39 ml  Output 2550 ml  Net -1205.61 ml   Filed Weights   10/24/19 0751  Weight: 78.5 kg    Examination:  General exam: alert and comfortable, not in distress.  Respiratory system: clear to auscultation, no wheezing heard.  Cardiovascular system: S1S2 heard, RRR, no JVD,  Gastrointestinal system: Abdomen is soft, distension improved. Ostomy with stool.  Central nervous system: alert and oriented, non focal  Extremities:  No pedal edema.  Skin: no rashes seen.  Psychiatry:  Anxious. .     Data Reviewed: I have personally reviewed following labs and imaging studies  CBC: Recent Labs  Lab 11/09/19 0434 11/11/19 0313 11/12/19 0357 11/15/19 0345  WBC 7.5 6.9 6.8 5.9  NEUTROABS  --   --   --  3.7  HGB 8.6* 9.4* 9.1* 9.3*  HCT 28.2* 30.6* 29.9* 29.6*  MCV 100.7* 100.3* 100.7* 99.7  PLT 398 429* 424* 469*    Basic Metabolic Panel: Recent Labs  Lab 11/09/19 0434 11/10/19 0320 11/11/19 0313 11/11/19 0313 11/12/19 0357 11/13/19 0355 11/14/19 0320 11/14/19 0954 11/15/19 0345  NA 139  --  141  --  141  --   --  138 139  K 3.6  --  3.2*  --  3.7  --   --  3.8 3.4*  CL 106  --  102  --  102  --   --  103 106  CO2 24  --  28  --  29  --   --  26 24  GLUCOSE 92  --  119*  --  124*  --   --  104* 113*  BUN 9  --  6  --  <5*  --   --  <5* <5*  CREATININE 0.82  0.88   < > 0.81   < > 0.84 0.87 0.73 0.74 0.62  CALCIUM 8.8*  --  9.3  --  9.5  --   --  9.2 8.9  MG 1.6*  --  1.7  --  1.8  --   --  1.7 1.7  PHOS 5.2*  --  4.8*  --   --   --   --  4.3 4.0   < > = values in this interval not displayed.    GFR: Estimated Creatinine Clearance: 86.4 mL/min (by C-G formula based on SCr of 0.62 mg/dL).  Liver Function Tests: Recent Labs  Lab 11/09/19 0434 11/11/19 0313 11/12/19 0357 11/14/19 0954 11/15/19 0345  AST  --   --  21 24 20   ALT  --   --  13 14 12   ALKPHOS  --   --  86 107 90  BILITOT  --   --  0.8 0.5 0.4  PROT  --   --  6.0* 6.9 6.0*  ALBUMIN 2.5* 3.0* 2.9* 3.2* 2.8*    CBG: Recent Labs  Lab 11/14/19 1745 11/14/19 2352 11/15/19 0652 11/15/19 1144  GLUCAP 90 109* 119* 108*     No results found for this or any previous visit (from the past 240 hour(s)).       Radiology Studies: DG CHEST PORT 1 VIEW  Result Date: 11/14/2019 CLINICAL DATA:  Status post PICC line placement EXAM: PORTABLE CHEST 1 VIEW COMPARISON:  August 10, 2018 FINDINGS: Right-sided PICC line is identified  with distal tip in the superior vena cava. There is no pneumothorax. The mediastinal contour and cardiac silhouette are normal. The lungs are clear. Bony structures are normal. IMPRESSION: Right-sided PICC line distal tip in the superior vena cava. No pneumothorax. Electronically Signed   By: 11/16/2019 M.D.   On: 11/14/2019 14:13   Sherian Rein EKG SITE RITE  Result Date: 11/14/2019 If Site Rite image not attached, placement could not be confirmed due to current cardiac rhythm.       Scheduled Meds: . Chlorhexidine Gluconate Cloth  6 each Topical Daily  . enoxaparin (LOVENOX) injection  40 mg Subcutaneous Q24H  . insulin aspart  0-9 Units Subcutaneous Q6H  . metoprolol tartrate  5 mg Intravenous Q8H  . nicotine  7 mg Transdermal Daily  . pantoprazole (PROTONIX) IV  40 mg Intravenous Q24H  . psyllium  1 packet Oral Daily  . sodium chloride flush  10-40 mL Intracatheter Q12H   Continuous Infusions: . dextrose 5 % and 0.45 % NaCl with KCl 20 mEq/L 30 mL/hr at 11/14/19 1642  . lactated ringers    . methocarbamol (ROBAXIN) IV 1,000 mg (11/15/19 11/16/19)  . piperacillin-tazobactam (ZOSYN)  IV 3.375 g (11/15/19 0610)  . TPN ADULT (ION) 40 mL/hr at 11/14/19 1836  . TPN ADULT (ION)       LOS: 22 days        11/17/19, MD [facility]   To contact the attending provider between 7A-7P or the covering provider during after hours 7P-7A, please log into the web site [email or web address] and access using universal [place] password for that web site. If you do not have the password, please call the hospital operator.  11/15/2019, 1:55 PM

## 2019-11-16 LAB — GLUCOSE, CAPILLARY
Glucose-Capillary: 111 mg/dL — ABNORMAL HIGH (ref 70–99)
Glucose-Capillary: 113 mg/dL — ABNORMAL HIGH (ref 70–99)
Glucose-Capillary: 119 mg/dL — ABNORMAL HIGH (ref 70–99)
Glucose-Capillary: 119 mg/dL — ABNORMAL HIGH (ref 70–99)
Glucose-Capillary: 138 mg/dL — ABNORMAL HIGH (ref 70–99)

## 2019-11-16 LAB — COMPREHENSIVE METABOLIC PANEL
ALT: 12 U/L (ref 0–44)
AST: 16 U/L (ref 15–41)
Albumin: 3 g/dL — ABNORMAL LOW (ref 3.5–5.0)
Alkaline Phosphatase: 88 U/L (ref 38–126)
Anion gap: 10 (ref 5–15)
BUN: 8 mg/dL (ref 6–20)
CO2: 23 mmol/L (ref 22–32)
Calcium: 9.4 mg/dL (ref 8.9–10.3)
Chloride: 105 mmol/L (ref 98–111)
Creatinine, Ser: 0.48 mg/dL (ref 0.44–1.00)
GFR calc Af Amer: >60 mL/min (ref >60)
GFR calc non Af Amer: >60 mL/min (ref >60)
Glucose, Bld: 119 mg/dL — ABNORMAL HIGH (ref 70–99)
Potassium: 4 mmol/L (ref 3.5–5.1)
Sodium: 138 mmol/L (ref 135–145)
Total Bilirubin: 0.2 mg/dL — ABNORMAL LOW (ref 0.3–1.2)
Total Protein: 6.3 g/dL — ABNORMAL LOW (ref 6.5–8.1)

## 2019-11-16 LAB — PHOSPHORUS: Phosphorus: 3.9 mg/dL (ref 2.5–4.6)

## 2019-11-16 LAB — MAGNESIUM: Magnesium: 1.7 mg/dL (ref 1.7–2.4)

## 2019-11-16 MED ORDER — TRAVASOL 10 % IV SOLN
INTRAVENOUS | Status: AC
  Filled 2019-11-16: qty 999.6

## 2019-11-16 NOTE — Progress Notes (Signed)
12 Days Post-Op  Subjective: No new issues.  Having increased ileostomy output.  Feeling better today overall with NG  Objective: Vital signs in last 24 hours: Temp:  [98.4 F (36.9 C)-99.5 F (37.5 C)] 98.7 F (37.1 C) (09/25 0520) Pulse Rate:  [72-78] 78 (09/25 0520) Resp:  [15-16] 15 (09/25 0520) BP: (151-171)/(86-92) 151/86 (09/25 0520) SpO2:  [96 %-99 %] 99 % (09/25 0520) Last BM Date: 11/15/19  Intake/Output from previous day: 09/24 0701 - 09/25 0700 In: 3151.6 [P.O.:630; I.V.:1444; IV Piggyback:1077.6] Out: 1840 [Urine:540; Emesis/NG output:900; Stool:400] Intake/Output this shift: No intake/output data recorded.  PE: Abd: soft, ileostomy with output, yesterday.  NGT with 900cc yesterday.  Still with bilious output today  Lab Results:  Recent Labs    11/15/19 0345  WBC 5.9  HGB 9.3*  HCT 29.6*  PLT 469*   BMET Recent Labs    11/15/19 0345 11/16/19 0645  NA 139 138  K 3.4* 4.0  CL 106 105  CO2 24 23  GLUCOSE 113* 119*  BUN <5* 8  CREATININE 0.62 0.48  CALCIUM 8.9 9.4   PT/INR No results for input(s): LABPROT, INR in the last 72 hours. CMP     Component Value Date/Time   NA 138 11/16/2019 0645   K 4.0 11/16/2019 0645   CL 105 11/16/2019 0645   CO2 23 11/16/2019 0645   GLUCOSE 119 (H) 11/16/2019 0645   BUN 8 11/16/2019 0645   CREATININE 0.48 11/16/2019 0645   CREATININE 0.68 10/14/2010 1059   CALCIUM 9.4 11/16/2019 0645   PROT 6.3 (L) 11/16/2019 0645   ALBUMIN 3.0 (L) 11/16/2019 0645   AST 16 11/16/2019 0645   ALT 12 11/16/2019 0645   ALKPHOS 88 11/16/2019 0645   BILITOT 0.2 (L) 11/16/2019 0645   GFRNONAA >60 11/16/2019 0645   GFRAA >60 11/16/2019 0645   Lipase     Component Value Date/Time   LIPASE 18 09/19/2019 1459       Studies/Results: DG CHEST PORT 1 VIEW  Result Date: 11/14/2019 CLINICAL DATA:  Status post PICC line placement EXAM: PORTABLE CHEST 1 VIEW COMPARISON:  August 10, 2018 FINDINGS: Right-sided PICC line  is identified with distal tip in the superior vena cava. There is no pneumothorax. The mediastinal contour and cardiac silhouette are normal. The lungs are clear. Bony structures are normal. IMPRESSION: Right-sided PICC line distal tip in the superior vena cava. No pneumothorax. Electronically Signed   By: Sherian Rein M.D.   On: 11/14/2019 14:13    Anti-infectives: Anti-infectives (From admission, onward)   Start     Dose/Rate Route Frequency Ordered Stop   11/14/19 1600  piperacillin-tazobactam (ZOSYN) IVPB 3.375 g        3.375 g 12.5 mL/hr over 240 Minutes Intravenous Every 8 hours 11/14/19 1514     11/03/19 0845  cefoTEtan (CEFOTAN) 2 g in sodium chloride 0.9 % 100 mL IVPB  Status:  Discontinued        2 g 200 mL/hr over 30 Minutes Intravenous On call to O.R. 11/03/19 0831 11/03/19 0842   10/25/19 1000  vancomycin (VANCOCIN) IVPB 1000 mg/200 mL premix  Status:  Discontinued        1,000 mg 200 mL/hr over 60 Minutes Intravenous Every 12 hours 10/24/19 1835 10/28/19 1727   10/24/19 2300  piperacillin-tazobactam (ZOSYN) IVPB 3.375 g  Status:  Discontinued        3.375 g 12.5 mL/hr over 240 Minutes Intravenous Every 8 hours 10/24/19 1835  11/13/19 1557   10/24/19 2000  vancomycin (VANCOREADY) IVPB 1500 mg/300 mL  Status:  Discontinued        1,500 mg 150 mL/hr over 120 Minutes Intravenous  Once 10/24/19 1835 10/24/19 1836   10/24/19 2000  vancomycin (VANCOREADY) IVPB 1500 mg/300 mL        1,500 mg 150 mL/hr over 120 Minutes Intravenous  Once 10/24/19 1836 10/24/19 2229   10/24/19 1730  piperacillin-tazobactam (ZOSYN) IVPB 3.375 g        3.375 g 100 mL/hr over 30 Minutes Intravenous  Once 10/24/19 1721 10/24/19 1810       Assessment/Plan 1.POD 12, s/pLAPAROSCOPIC diverting loop ileostomy- 11/04/2019 - [place]for complicated diverticulitis, possible malignancy, colovaginal fistula -undrained abscess in abdomen is improving on her follow up CT scan yesterday.              -unfortunately, she still has a lot of inflammatory changes in her RLQ causing at least a partial blockage of her intestines causing significant back up of enteric contents.   cont NGT.  Will leave this at least for the next several days to hopefully allow this inflammation to resolve and her obstruction to resolve as well.  Ideally would like to avoid a repeat operation at this point given the amount of inflammation going on in her abdomen             -PICC and TNA while NPO             -colovaginal fistula still present but improving as drainage is less after surgery  2. COPD 3. History of seizures 4. Anxiety/depression 5. Anemia  Hgb -stable 6. Hypokalemia-improving  FEN -NPO/NGT/IVFs/TNA VTE -Lovenox ID -zosyn 9/2 -->continue due to persistent, undrainable abscess in pelvis, improving   LOS: 23 days    Vanita Panda, MD  Colorectal and General Surgery [facility]

## 2019-11-16 NOTE — Progress Notes (Signed)
PHARMACY - TOTAL PARENTERAL NUTRITION CONSULT NOTE   Indication: Prolonged ileus, SBO  Patient Measurements: Height: 5\' 8"  (172.7 cm) Weight: 78.5 kg (173 lb 1 oz) IBW/kg (Calculated) : 63.9 TPN AdjBW (KG): 78.5 Body mass index is 26.31 kg/m.  Assessment: Patient is a 54 y.o F who was instructed by her gastroenterologist to come to the ED on 9/2 for treatment of intra-abdominal abscesses.  She underwent flex sigmoidoscopy on 9/8 with biopsies taken and noted diverticular disease.  She subsequently underwent lap diverting loop ileostomy on 9/13.  Abdominal CT on 9/22 showed SBO.  Pharmacy is consulted on 9/23 to start TPN.  Glucose / Insulin: CBGs controlled; on sensitive insulin correction coverage Electrolytes: K, Mag and phos all improved, others WNL Renal: scr wnl; BUN <5 LFTs / TGs: LFTs wnl, TG 199 Prealbumin / albumin: albumin 3; pre-albumin 18.7 Intake / Output; MIVF:  - increased NGT output last 24hr - 1.1 L, net I/O +1.2 L - D5 0.45NS with 20 KCL at 30 ml/hr - d/c'd 9/24 GI Imaging: - 9/1 abd CT: Enlarging cystic, fluid containing area in the RIGHT adnexa may represent worsening of abscess adjacent to or involving the ovary. 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 a centrally located abscess within the pelvis which has demonstrated interval increase in size. - 9/14 pelvis MRI: There is a redemonstrated abscess or fistula cavity in the central pelvis containing air and fluid interposed between the mid sigmoid colon and rectum. Findings are again suspicious of abscess or fistula related to prior tip appendicitis or alternately diverticulitis and not significantly changed compared to prior examinations. The presence of air  and fluid in the vagina is again highly suspicious for vaginal fistula. - 9/22 abd CT: positive for small bowel obstruction Surgeries / Procedures:  - 9/8 flex sigmoidoscopy: biopsies taken and noted diverticular disease - 9/13: lap diverting loop ileostomy on 9/13  Significant events: - 9/22: NGT placement; NPO  Central access: PICC placed 9/23 TPN start date: 11/14/2019  Nutritional Goals (per RD recommendation on 9/23): kCal: 1900-2100, Protein: 95-105, Fluid: 2L/day  Goal TPN rate is 85 mL/hr (provides 100 g of protein and 2060 kcals per day)  Current Nutrition:  - BOOST TID from 9/9 to 9/22 - currently NPO  Plan:  - Increase TPN from 60 ml/hr to goal 85 ml/hr at 1800 - Electrolytes in TPN: 71mEq/L of Na, 4mEq/L of K, 71mEq/L of Ca, 84mEq/L of Mg, and 30mmol/L of Phos. Cl:Ac ratio 1:1 - Add standard MVI and trace elements to TPN - Continue Sensitive q6h SSI and adjust as needed  - Monitor TPN labs on Mon/Thurs   12m, PharmD, BCPS 11/16/2019,11:09 AM

## 2019-11-16 NOTE — Progress Notes (Signed)
PROGRESS NOTE    Susan Todd  PNT:[id number] DOB: 1965-11-27 DOA: 10/24/2019 PCP: Georgann Housekeeper, MD    Chief Complaint  Patient presents with  . Abdominal Pain    Brief Narrative:  54 year old lady prior history of hypertension, hyperlipidemia, anxiety, depression, seizure disorder, COPD presents on 9/2 with worsening abdominal pain.  CT of the abdomen pelvis showed several abscesses within the abdomen along with colovaginal fistula loss.  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  GI and general surgery were consulted.  Patient underwent flex sigmoidoscopy on 10/30/2019 showing diverticulosis in the rectosigmoid and sigmoid colon along with stenosis and narrowing.  GI recommended outpatient follow-up with her colonoscopy.  Repeat CT of the abdomen pelvis on 9/9 showed central loculated abscess within the pelvis increased in size and not amenable to percutaneous drainage given its location.  She underwent diagnostic laparotomy with laparoscopic diverting loop ileostomy on 11/04/2019.  MRI of the pelvis on 11/05/2019 demonstrated abscess/fistula cavity in the central pelvis containing air and fluid interspersed between the mid sigmoid colon and rectum measuring 3.7 into 3.1 cm suspicious for abscess.   General surgery, on board, pt continues to have some nausea, and abd pain.  abd x ray unremarkable. CT abd and pelvis  is positive for small bowel obstruction. Transition point is identified within the right lower quadrant of the abdomen in the area of the inflamed appendix. She is made NPO and Ng tube placed. TPN started .  She continues to have brown fluid aspirated from the NG tube. Patient reports her abdominal distention has improved denies having nausea or vomiting.    Assessment & Plan:   Principal Problem:   Intra-abdominal abscess (HCC) Active Problems:   Depression   Anxiety    Seizures (HCC)   COPD (chronic obstructive pulmonary disease) (HCC)   Seizure (HCC)   Sepsis secondary to intra-abdominal/pelvic abscess from suspected diverticulitis with colovaginal fistula Initial CT of the abdomen pelvis and MRI pelvis revealed multiple abscesses.  Not amenable to IR drainage given its location. S/p laparotomy with laparoscopic diverting loop ileostomy. Patient has been on IV Zosyn from 10/24/2019 till 11/13/2019, general surgery wants to continue the IV antibiotics for persistent abscess .  Continue with Protonix and GI cocktail.   Patient continues to have abdominal pain and distention and abdominal x-rays are unremarkable was followed with a CT of the abdomen and pelvis showing small bowel obstruction with transition point in the right lower quadrant at the site of the appendix.  NG tube was placed ,  general surgery on board and following. abd distention has improved, nausea has improved, able to tolerate clear liquids.  TPN while NPO.  Patient continues to have brown liquid /increased ileostomy output. Patient is feeling better overall but not back to baseline yet.  Surgery recommends to continue with NG tube.    Anxiety and depression. Stable.   COPD:  No wheezing heard.   Hypokalemia, hypomagnesemia  Replaced.    Hyperphosphatemia:  Replaced  Seizures:  Stable.   GERD On PPI IV daily.   Anemia of chronic disease/Macrocytic anemia;  Anemia panel reviewed.  Hemoglobin around 9.    essential hypertension:  Blood pressure parameter this morning better controlled On IV metoprolol, prn hydralazine.     DVT prophylaxis: lovenox.  Code Status: full code.  Family Communication: (none at bedside) Disposition:   Status is: Inpatient  Remains inpatient appropriate because:Ongoing diagnostic testing  needed not appropriate for outpatient work up, IV treatments appropriate due to intensity of illness or inability to take PO and Inpatient level of care  appropriate due to severity of illness secondary to persistent SBO   Dispo: The patient is from: Home              Anticipated d/c is to: pending              Anticipated d/c date is: > 3 days              Patient currently is not medically stable to d/c.       Consultants:   General surgery  Procedures:  9/8-Flex sigmoidoscopy revealed diverticulosis in the rectosigmoid and sigmoid colon along with the stenosis and narrowing.  Biopsies from colon and rectum negative for inflammation or malignancy 9/13-diagnostic laparotomy with laparoscopic diverting loop ileostomy Antimicrobials:  Antibiotics Given (last 72 hours)    Date/Time Action Medication Dose Rate   11/14/19 1634 New Bag/Given   piperacillin-tazobactam (ZOSYN) IVPB 3.375 g 3.375 g 12.5 mL/hr   11/14/19 2323 New Bag/Given   piperacillin-tazobactam (ZOSYN) IVPB 3.375 g 3.375 g 12.5 mL/hr   11/15/19 0610 New Bag/Given   piperacillin-tazobactam (ZOSYN) IVPB 3.375 g 3.375 g 12.5 mL/hr   11/15/19 1759 New Bag/Given   piperacillin-tazobactam (ZOSYN) IVPB 3.375 g 3.375 g 12.5 mL/hr   11/16/19 0128 New Bag/Given   piperacillin-tazobactam (ZOSYN) IVPB 3.375 g 3.375 g 12.5 mL/hr   11/16/19 1048 New Bag/Given   piperacillin-tazobactam (ZOSYN) IVPB 3.375 g 3.375 g 12.5 mL/hr       Subjective: Patient reports feeling better walking in the hallway with the NG tube   Objective: Vitals:   11/15/19 1829 11/15/19 2110 11/16/19 0520 11/16/19 1324  BP: (!) 165/87 (!) 171/92 (!) 151/86 138/90  Pulse: 72 72 78 77  Resp: 16 16 15 16   Temp: 98.4 F (36.9 C) 99.5 F (37.5 C) 98.7 F (37.1 C) 98.9 F (37.2 C)  TempSrc: Oral Oral Oral Oral  SpO2: 99% 96% 99% 98%  Weight:      Height:        Intake/Output Summary (Last 24 hours) at 11/16/2019 1614 Last data filed at 11/16/2019 1431 Gross per 24 hour  Intake 2920.61 ml  Output 1690 ml  Net 1230.61 ml   Filed Weights   10/24/19 0751  Weight: 78.5 kg     Examination:  General exam: Alert and comfortable not in any kind of distress Respiratory system: Clear to auscultation bilaterally, no wheezing or rhonchi Cardiovascular system: S1-S2 heard, regular rate rhythm, no JVD Gastrointestinal system: Abdomen is soft, distention has improved, ileostomy with brown stool, bowel sounds heard Central nervous system: Alert and oriented, grossly nonfocal Extremities: No pedal edema Skin: No ulcers or rashes seen Psychiatry: Mood is appropriate   Data Reviewed: I have personally reviewed following labs and imaging studies  CBC: Recent Labs  Lab 11/11/19 0313 11/12/19 0357 11/15/19 0345  WBC 6.9 6.8 5.9  NEUTROABS  --   --  3.7  HGB 9.4* 9.1* 9.3*  HCT 30.6* 29.9* 29.6*  MCV 100.3* 100.7* 99.7  PLT 429* 424* 469*    Basic Metabolic Panel: Recent Labs  Lab 11/11/19 0313 11/11/19 0313 11/12/19 0357 11/12/19 0357 11/13/19 0355 11/14/19 0320 11/14/19 0954 11/15/19 0345 11/16/19 0645  NA 141  --  141  --   --   --  138 139 138  K 3.2*  --  3.7  --   --   --  3.8 3.4* 4.0  CL 102  --  102  --   --   --  103 106 105  CO2 28  --  29  --   --   --  26 24 23   GLUCOSE 119*  --  124*  --   --   --  104* 113* 119*  BUN 6  --  <5*  --   --   --  <5* <5* 8  CREATININE 0.81   < > 0.84   < > 0.87 0.73 0.74 0.62 0.48  CALCIUM 9.3  --  9.5  --   --   --  9.2 8.9 9.4  MG 1.7  --  1.8  --   --   --  1.7 1.7 1.7  PHOS 4.8*  --   --   --   --   --  4.3 4.0 3.9   < > = values in this interval not displayed.    GFR: Estimated Creatinine Clearance: 86.4 mL/min (by C-G formula based on SCr of 0.48 mg/dL).  Liver Function Tests: Recent Labs  Lab 11/11/19 0313 11/12/19 0357 11/14/19 0954 11/15/19 0345 11/16/19 0645  AST  --  21 24 20 16   ALT  --  13 14 12 12   ALKPHOS  --  86 107 90 88  BILITOT  --  0.8 0.5 0.4 0.2*  PROT  --  6.0* 6.9 6.0* 6.3*  ALBUMIN 3.0* 2.9* 3.2* 2.8* 3.0*    CBG: Recent Labs  Lab 11/15/19 1144  11/15/19 1813 11/16/19 0012 11/16/19 0522 11/16/19 1207  GLUCAP 108* 129* 111* 119* 113*     No results found for this or any previous visit (from the past 240 hour(s)).       Radiology Studies: No results found.      Scheduled Meds: . Chlorhexidine Gluconate Cloth  6 each Topical Daily  . enoxaparin (LOVENOX) injection  40 mg Subcutaneous Q24H  . insulin aspart  0-9 Units Subcutaneous Q6H  . metoprolol tartrate  5 mg Intravenous Q8H  . nicotine  7 mg Transdermal Daily  . pantoprazole (PROTONIX) IV  40 mg Intravenous Q24H  . psyllium  1 packet Oral Daily  . sodium chloride flush  10-40 mL Intracatheter Q12H   Continuous Infusions: . lactated ringers    . methocarbamol (ROBAXIN) IV 1,000 mg (11/16/19 1431)  . piperacillin-tazobactam (ZOSYN)  IV 3.375 g (11/16/19 1048)  . TPN ADULT (ION) 60 mL/hr at 11/15/19 1740  . TPN ADULT (ION)       LOS: 23 days        11/18/19, MD [facility]   To contact the attending provider between 7A-7P or the covering provider during after hours 7P-7A, please log into the web site [email or web address] and access using universal [place] password for that web site. If you do not have the password, please call the hospital operator.  11/16/2019, 4:14 PM

## 2019-11-17 LAB — COMPREHENSIVE METABOLIC PANEL
ALT: 11 U/L (ref 0–44)
AST: 16 U/L (ref 15–41)
Albumin: 3 g/dL — ABNORMAL LOW (ref 3.5–5.0)
Alkaline Phosphatase: 92 U/L (ref 38–126)
Anion gap: 11 (ref 5–15)
BUN: 13 mg/dL (ref 6–20)
CO2: 23 mmol/L (ref 22–32)
Calcium: 9.5 mg/dL (ref 8.9–10.3)
Chloride: 105 mmol/L (ref 98–111)
Creatinine, Ser: 0.49 mg/dL (ref 0.44–1.00)
GFR calc Af Amer: >60 mL/min (ref >60)
GFR calc non Af Amer: >60 mL/min (ref >60)
Glucose, Bld: 107 mg/dL — ABNORMAL HIGH (ref 70–99)
Potassium: 4 mmol/L (ref 3.5–5.1)
Sodium: 139 mmol/L (ref 135–145)
Total Bilirubin: 0.3 mg/dL (ref 0.3–1.2)
Total Protein: 6.5 g/dL (ref 6.5–8.1)

## 2019-11-17 LAB — MAGNESIUM: Magnesium: 1.7 mg/dL (ref 1.7–2.4)

## 2019-11-17 LAB — GLUCOSE, CAPILLARY
Glucose-Capillary: 104 mg/dL — ABNORMAL HIGH (ref 70–99)
Glucose-Capillary: 111 mg/dL — ABNORMAL HIGH (ref 70–99)

## 2019-11-17 LAB — PHOSPHORUS: Phosphorus: 4.5 mg/dL (ref 2.5–4.6)

## 2019-11-17 MED ORDER — TRAVASOL 10 % IV SOLN
INTRAVENOUS | Status: AC
  Filled 2019-11-17: qty 999.6

## 2019-11-17 MED ORDER — MENTHOL 3 MG MT LOZG
1.0000 | LOZENGE | OROMUCOSAL | Status: DC
  Administered 2019-11-17 – 2019-11-27 (×36): 3 mg via ORAL
  Filled 2019-11-17 (×10): qty 9

## 2019-11-17 NOTE — Progress Notes (Signed)
PHARMACY - TOTAL PARENTERAL NUTRITION CONSULT NOTE   Indication: Prolonged ileus, SBO  Patient Measurements: Height: 5\' 8"  (172.7 cm) Weight: 78.5 kg (173 lb 1 oz) IBW/kg (Calculated) : 63.9 TPN AdjBW (KG): 78.5 Body mass index is 26.31 kg/m.  Assessment: Patient is a 54 y.o F who was instructed by her gastroenterologist to come to the ED on 9/2 for treatment of intra-abdominal abscesses.  She underwent flex sigmoidoscopy on 9/8 with biopsies taken and noted diverticular disease.  She subsequently underwent lap diverting loop ileostomy on 9/13.  Abdominal CT on 9/22 showed SBO.  Pharmacy is consulted on 9/23 to start TPN.  Glucose / Insulin: CBGs controlled; on sensitive insulin correction coverage Electrolytes: WNL Renal: scr wnl; BUN 13 LFTs / TGs: LFTs wnl, TG 199 Prealbumin / albumin: albumin 3; pre-albumin 18.7 Intake / Output; MIVF:  - increased NGT output last 24hr - 1.9 L, net I/O -64mL - D5 0.45NS with 20 KCL at 30 ml/hr - d/c'd 9/24 GI Imaging: - 9/1 abd CT: Enlarging cystic, fluid containing area in the RIGHT adnexa may represent worsening of abscess adjacent to or involving the ovary. 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 a centrally located abscess within the pelvis which has demonstrated interval increase in size. - 9/14 pelvis MRI: There is a redemonstrated abscess or fistula cavity in the central pelvis containing air and fluid interposed between the mid sigmoid colon and rectum. Findings are again suspicious of abscess or fistula related to prior tip appendicitis or alternately diverticulitis and not significantly changed compared to prior examinations. The presence of air and fluid in the vagina is again  highly suspicious for vaginal fistula. - 9/22 abd CT: positive for small bowel obstruction Surgeries / Procedures:  - 9/8 flex sigmoidoscopy: biopsies taken and noted diverticular disease - 9/13: lap diverting loop ileostomy on 9/13  Significant events: - 9/22: NGT placement; NPO  Central access: PICC placed 9/23 TPN start date: 11/14/2019  Nutritional Goals (per RD recommendation on 9/23): kCal: 1900-2100, Protein: 95-105, Fluid: 2L/day  Goal TPN rate is 85 mL/hr (provides 100 g of protein and 2060 kcals per day)  Current Nutrition:  - BOOST TID from 9/9 to 9/22 - currently NPO  Plan:  - Continue TPN at goal rate of 85 ml/hr at 1800 - Electrolytes in TPN: 79mEq/L of Na, 22mEq/L of K, 73mEq/L of Ca, 71mEq/L of Mg, and 54mmol/L of Phos. Cl:Ac ratio 1:1 - Add standard MVI and trace elements to TPN - Will d/c SSI/CBG checks due to continued goal values - Monitor TPN labs on Mon/Thurs   12m, PharmD, BCPS 11/17/2019,10:17 AM

## 2019-11-17 NOTE — Plan of Care (Signed)

## 2019-11-17 NOTE — Progress Notes (Addendum)
PROGRESS NOTE    Susan Todd  GHW:[id number] DOB: [birth year] DOA: 10/24/2019 PCP: Georgann Housekeeper, MD    Chief Complaint  Patient presents with  . Abdominal Pain    Brief Narrative:  54 year old lady prior history of hypertension, hyperlipidemia, anxiety, depression, seizure disorder, COPD presents on 9/2 with worsening abdominal pain.  CT of the abdomen pelvis showed several abscesses within the abdomen along with colovaginal fistula loss.  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  GI and general surgery were consulted.  Patient underwent flex sigmoidoscopy on 10/30/2019 showing diverticulosis in the rectosigmoid and sigmoid colon along with stenosis and narrowing.  GI recommended outpatient follow-up with her colonoscopy.  Repeat CT of the abdomen pelvis on 9/9 showed central loculated abscess within the pelvis increased in size and not amenable to percutaneous drainage given its location.  She underwent diagnostic laparotomy with laparoscopic diverting loop ileostomy on 11/04/2019.  MRI of the pelvis on 11/05/2019 demonstrated abscess/fistula cavity in the central pelvis containing air and fluid interspersed between the mid sigmoid colon and rectum measuring 3.7 into 3.1 cm suspicious for abscess.   General surgery, on board, pt continues to have some nausea, and abd pain.  abd x ray unremarkable. CT abd and pelvis  is positive for small bowel obstruction. Transition point is identified within the right lower quadrant of the abdomen in the area of the inflamed appendix. She is made NPO and Ng tube placed. TPN started .  She continues to have brown fluid aspirated from the NG tube. Pt see and examined at bedside.  Unfortunately she remains on NG tube , not much output from the ileostomy. Walking in the hallways.    Assessment & Plan:   Principal Problem:   Intra-abdominal abscess  (HCC) Active Problems:   Depression   Anxiety   Seizures (HCC)   COPD (chronic obstructive pulmonary disease) (HCC)   Seizure (HCC)   Sepsis secondary to intra-abdominal/pelvic abscess from suspected diverticulitis with colovaginal fistula Initial CT of the abdomen pelvis and MRI pelvis revealed multiple abscesses.  Not amenable to IR drainage given its location. S/p laparotomy with laparoscopic diverting loop ileostomy. Patient has been on IV Zosyn from 10/24/2019 till 11/13/2019, general surgery wants to continue the IV antibiotics for persistent abscess .  Continue with Protonix and GI cocktail.   Patient continues to have abdominal pain and distention and abdominal x-rays are unremarkable was followed with a CT of the abdomen and pelvis showing small bowel obstruction with transition point in the right lower quadrant at the site of the appendix.  NG tube was placed and connected to low suction.  TPN while NPO.  Patient continues to have NG tube, decreased or minimal output from ileostomy. She is walking in the hallways.  Surgery on board and assisting with management.    Anxiety and depression. Stable.   COPD:  No wheezing heard. Resume bronchodilators as needed.    Hypokalemia, hypomagnesemia  Replaced.    Hypophosphatemia:  Replaced.   Seizures:  Stable.   GERD On PPI IV daily.   Anemia of chronic disease/Macrocytic anemia;  Anemia panel reviewed.  Hemoglobin around 9.    essential hypertension:  Well controlled. Prn IV metoprolol.     DVT prophylaxis: lovenox.  Code Status: full code.  Family Communication: (none at bedside) Disposition:   Status is: Inpatient  Remains inpatient appropriate because:Ongoing diagnostic testing needed not appropriate for outpatient work  up, IV treatments appropriate due to intensity of illness or inability to take PO and Inpatient level of care appropriate due to severity of illness secondary to persistent SBO   Dispo:  The patient is from: Home              Anticipated d/c is to: pending              Anticipated d/c date is: > 3 days              Patient currently is not medically stable to d/c.       Consultants:   General surgery  Procedures:  9/8-Flex sigmoidoscopy revealed diverticulosis in the rectosigmoid and sigmoid colon along with the stenosis and narrowing.  Biopsies from colon and rectum negative for inflammation or malignancy 9/13-diagnostic laparotomy with laparoscopic diverting loop ileostomy Antimicrobials:  Antibiotics Given (last 72 hours)    Date/Time Action Medication Dose Rate   11/14/19 1634 New Bag/Given   piperacillin-tazobactam (ZOSYN) IVPB 3.375 g 3.375 g 12.5 mL/hr   11/14/19 2323 New Bag/Given   piperacillin-tazobactam (ZOSYN) IVPB 3.375 g 3.375 g 12.5 mL/hr   11/15/19 0610 New Bag/Given   piperacillin-tazobactam (ZOSYN) IVPB 3.375 g 3.375 g 12.5 mL/hr   11/15/19 1759 New Bag/Given   piperacillin-tazobactam (ZOSYN) IVPB 3.375 g 3.375 g 12.5 mL/hr   11/16/19 0128 New Bag/Given   piperacillin-tazobactam (ZOSYN) IVPB 3.375 g 3.375 g 12.5 mL/hr   11/16/19 1048 New Bag/Given   piperacillin-tazobactam (ZOSYN) IVPB 3.375 g 3.375 g 12.5 mL/hr   11/16/19 2324 New Bag/Given   piperacillin-tazobactam (ZOSYN) IVPB 3.375 g 3.375 g 12.5 mL/hr   11/17/19 0648 New Bag/Given   piperacillin-tazobactam (ZOSYN) IVPB 3.375 g 3.375 g 12.5 mL/hr   11/17/19 1524 New Bag/Given   piperacillin-tazobactam (ZOSYN) IVPB 3.375 g 3.375 g 12.5 mL/hr       Subjective: Pt walking in the hallway and hoping to improve.  Objective: Vitals:   11/16/19 1324 11/16/19 2118 11/17/19 0600 11/17/19 1322  BP: 138/90 (!) 142/84 129/80 131/81  Pulse: 77 78 71 78  Resp: 16 16 16 16   Temp: 98.9 F (37.2 C) 98.7 F (37.1 C) 97.8 F (36.6 C) 98.4 F (36.9 C)  TempSrc: Oral Oral Oral Oral  SpO2: 98% 99% 98% 100%  Weight:      Height:        Intake/Output Summary (Last 24 hours) at 11/17/2019  1555 Last data filed at 11/17/2019 1500 Gross per 24 hour  Intake 2993.79 ml  Output 1700 ml  Net 1293.79 ml   Filed Weights   10/24/19 0751  Weight: 78.5 kg    Examination:  General exam: Alert and comfortable, not in distress.  Respiratory system: clear to auscultation, no wheezing heard.  Cardiovascular system: S1S2 RRR no JVD. No pedal edema.  Gastrointestinal system: Abdomen is  Soft, mild tender in the lower quadrant, mildly distended bowel sounds wnl.  Central nervous system: alert and oriented, non focal. Extremities: NO pedal edema.  Skin: No rashes seen.  Psychiatry: Mood is appropriate.    Data Reviewed: I have personally reviewed following labs and imaging studies  CBC: Recent Labs  Lab 11/11/19 0313 11/12/19 0357 11/15/19 0345  WBC 6.9 6.8 5.9  NEUTROABS  --   --  3.7  HGB 9.4* 9.1* 9.3*  HCT 30.6* 29.9* 29.6*  MCV 100.3* 100.7* 99.7  PLT 429* 424* 469*    Basic Metabolic Panel: Recent Labs  Lab 11/11/19 0313 11/11/19 0313 11/12/19 0357 11/13/19 0355  11/14/19 0320 11/14/19 0954 11/15/19 0345 11/16/19 0645 11/17/19 0622  NA 141   < > 141  --   --  138 139 138 139  K 3.2*   < > 3.7  --   --  3.8 3.4* 4.0 4.0  CL 102   < > 102  --   --  103 106 105 105  CO2 28   < > 29  --   --  26 24 23 23   GLUCOSE 119*   < > 124*  --   --  104* 113* 119* 107*  BUN 6   < > <5*  --   --  <5* <5* 8 13  CREATININE 0.81   < > 0.84   < > 0.73 0.74 0.62 0.48 0.49  CALCIUM 9.3   < > 9.5  --   --  9.2 8.9 9.4 9.5  MG 1.7   < > 1.8  --   --  1.7 1.7 1.7 1.7  PHOS 4.8*  --   --   --   --  4.3 4.0 3.9 4.5   < > = values in this interval not displayed.    GFR: Estimated Creatinine Clearance: 86.4 mL/min (by C-G formula based on SCr of 0.49 mg/dL).  Liver Function Tests: Recent Labs  Lab 11/12/19 0357 11/14/19 0954 11/15/19 0345 11/16/19 0645 11/17/19 0622  AST 21 24 20 16 16   ALT 13 14 12 12 11   ALKPHOS 86 107 90 88 92  BILITOT 0.8 0.5 0.4 0.2* 0.3  PROT  6.0* 6.9 6.0* 6.3* 6.5  ALBUMIN 2.9* 3.2* 2.8* 3.0* 3.0*    CBG: Recent Labs  Lab 11/16/19 1207 11/16/19 1801 11/16/19 2322 11/17/19 0558 11/17/19 1202  GLUCAP 113* 119* 138* 104* 111*     No results found for this or any previous visit (from the past 240 hour(s)).       Radiology Studies: No results found.      Scheduled Meds: . Chlorhexidine Gluconate Cloth  6 each Topical Daily  . enoxaparin (LOVENOX) injection  40 mg Subcutaneous Q24H  . menthol-cetylpyridinium  1 lozenge Oral Q2H while awake  . metoprolol tartrate  5 mg Intravenous Q8H  . nicotine  7 mg Transdermal Daily  . pantoprazole (PROTONIX) IV  40 mg Intravenous Q24H  . psyllium  1 packet Oral Daily  . sodium chloride flush  10-40 mL Intracatheter Q12H   Continuous Infusions: . lactated ringers    . methocarbamol (ROBAXIN) IV 1,000 mg (11/17/19 1424)  . piperacillin-tazobactam (ZOSYN)  IV 3.375 g (11/17/19 1524)  . TPN ADULT (ION) 85 mL/hr at 11/16/19 2200  . TPN ADULT (ION)       LOS: 24 days        11/19/19, MD [facility]   To contact the attending provider between 7A-7P or the covering provider during after hours 7P-7A, please log into the web site [email or web address] and access using universal [place] password for that web site. If you do not have the password, please call the hospital operator.  11/17/2019, 3:55 PM

## 2019-11-17 NOTE — Progress Notes (Signed)
13 Days Post-Op  Subjective: No new issues.  NG output still high.  C/o sore throat Objective: Vital signs in last 24 hours: Temp:  [97.8 F (36.6 C)-98.9 F (37.2 C)] 97.8 F (36.6 C) (09/26 0600) Pulse Rate:  [71-78] 71 (09/26 0600) Resp:  [16] 16 (09/26 0600) BP: (129-142)/(80-90) 129/80 (09/26 0600) SpO2:  [98 %-99 %] 98 % (09/26 0600) Last BM Date: 11/16/19  Intake/Output from previous day: 09/25 0701 - 09/26 0700 In: 2804.7 [P.O.:520; I.V.:1734.7; IV Piggyback:550] Out: 2900 [Urine:600; Emesis/NG output:1950; Stool:350] Intake/Output this shift: No intake/output data recorded.  PE: Abd: soft, ileostomy with some output.  NGT with 1900cc (500 PO intake) yesterday.  Still with bilious output today  Lab Results:  Recent Labs    11/15/19 0345  WBC 5.9  HGB 9.3*  HCT 29.6*  PLT 469*   BMET Recent Labs    11/16/19 0645 11/17/19 0622  NA 138 139  K 4.0 4.0  CL 105 105  CO2 23 23  GLUCOSE 119* 107*  BUN 8 13  CREATININE 0.48 0.49  CALCIUM 9.4 9.5   PT/INR No results for input(s): LABPROT, INR in the last 72 hours. CMP     Component Value Date/Time   NA 139 11/17/2019 0622   K 4.0 11/17/2019 0622   CL 105 11/17/2019 0622   CO2 23 11/17/2019 0622   GLUCOSE 107 (H) 11/17/2019 0622   BUN 13 11/17/2019 0622   CREATININE 0.49 11/17/2019 0622   CREATININE 0.68 10/14/2010 1059   CALCIUM 9.5 11/17/2019 0622   PROT 6.5 11/17/2019 0622   ALBUMIN 3.0 (L) 11/17/2019 0622   AST 16 11/17/2019 0622   ALT 11 11/17/2019 0622   ALKPHOS 92 11/17/2019 0622   BILITOT 0.3 11/17/2019 0622   GFRNONAA >60 11/17/2019 0622   GFRAA >60 11/17/2019 0622   Lipase     Component Value Date/Time   LIPASE 18 09/19/2019 1459       Studies/Results: No results found.  Anti-infectives: Anti-infectives (From admission, onward)   Start     Dose/Rate Route Frequency Ordered Stop   11/14/19 1600  piperacillin-tazobactam (ZOSYN) IVPB 3.375 g        3.375 g 12.5 mL/hr  over 240 Minutes Intravenous Every 8 hours 11/14/19 1514     11/03/19 0845  cefoTEtan (CEFOTAN) 2 g in sodium chloride 0.9 % 100 mL IVPB  Status:  Discontinued        2 g 200 mL/hr over 30 Minutes Intravenous On call to O.R. 11/03/19 0831 11/03/19 0842   10/25/19 1000  vancomycin (VANCOCIN) IVPB 1000 mg/200 mL premix  Status:  Discontinued        1,000 mg 200 mL/hr over 60 Minutes Intravenous Every 12 hours 10/24/19 1835 10/28/19 1727   10/24/19 2300  piperacillin-tazobactam (ZOSYN) IVPB 3.375 g  Status:  Discontinued        3.375 g 12.5 mL/hr over 240 Minutes Intravenous Every 8 hours 10/24/19 1835 11/13/19 1557   10/24/19 2000  vancomycin (VANCOREADY) IVPB 1500 mg/300 mL  Status:  Discontinued        1,500 mg 150 mL/hr over 120 Minutes Intravenous  Once 10/24/19 1835 10/24/19 1836   10/24/19 2000  vancomycin (VANCOREADY) IVPB 1500 mg/300 mL        1,500 mg 150 mL/hr over 120 Minutes Intravenous  Once 10/24/19 1836 10/24/19 2229   10/24/19 1730  piperacillin-tazobactam (ZOSYN) IVPB 3.375 g        3.375 g 100 mL/hr  over 30 Minutes Intravenous  Once 10/24/19 1721 10/24/19 1810       Assessment/Plan 1.POD 13, s/pLAPAROSCOPIC diverting loop ileostomy- 11/04/2019 - [place] complicated diverticulitis, possible malignancy, colovaginal fistula -undrained abscess in abdomen is improving on her follow up CT scan yesterday.             -unfortunately, she still has a lot of inflammatory changes in her RLQ causing at least a partial blockage of her intestines causing significant back up of enteric contents.   cont NGT.  Will leave this at least for the next several days to hopefully allow this inflammation to resolve and her obstruction to resolve as well.  Ideally would like to avoid a repeat operation at this point given the amount of inflammation going on in her abdomen             -PICC and TNA while NPO             -colovaginal fistula still present but improving as drainage is  less after surgery  2. COPD 3. History of seizures 4. Anxiety/depression 5. Anemia  Hgb -stable 6. Hypokalemia-improving  FEN -NPO/NGT/IVFs/TNA VTE -Lovenox ID -zosyn 9/2 -->continue due to persistent, undrainable abscess in pelvis, improving   LOS: 24 days    Vanita Panda, MD  Colorectal and General Surgery [facility]

## 2019-11-18 ENCOUNTER — Inpatient Hospital Stay (HOSPITAL_COMMUNITY): Payer: Self-pay

## 2019-11-18 LAB — COMPREHENSIVE METABOLIC PANEL
ALT: 13 U/L (ref 0–44)
AST: 19 U/L (ref 15–41)
Albumin: 3.1 g/dL — ABNORMAL LOW (ref 3.5–5.0)
Alkaline Phosphatase: 103 U/L (ref 38–126)
Anion gap: 10 (ref 5–15)
BUN: 17 mg/dL (ref 6–20)
CO2: 24 mmol/L (ref 22–32)
Calcium: 9.6 mg/dL (ref 8.9–10.3)
Chloride: 104 mmol/L (ref 98–111)
Creatinine, Ser: 0.6 mg/dL (ref 0.44–1.00)
GFR calc Af Amer: >60 mL/min (ref >60)
GFR calc non Af Amer: >60 mL/min (ref >60)
Glucose, Bld: 107 mg/dL — ABNORMAL HIGH (ref 70–99)
Potassium: 4 mmol/L (ref 3.5–5.1)
Sodium: 138 mmol/L (ref 135–145)
Total Bilirubin: 0.5 mg/dL (ref 0.3–1.2)
Total Protein: 6.9 g/dL (ref 6.5–8.1)

## 2019-11-18 LAB — CBC
HCT: 30.6 % — ABNORMAL LOW (ref 36.0–46.0)
Hemoglobin: 9.3 g/dL — ABNORMAL LOW (ref 12.0–15.0)
MCH: 30.1 pg (ref 26.0–34.0)
MCHC: 30.4 g/dL (ref 30.0–36.0)
MCV: 99 fL (ref 80.0–100.0)
Platelets: 367 10*3/uL (ref 150–400)
RBC: 3.09 MIL/uL — ABNORMAL LOW (ref 3.87–5.11)
RDW: 14.7 % (ref 11.5–15.5)
WBC: 5.6 10*3/uL (ref 4.0–10.5)
nRBC: 0 % (ref 0.0–0.2)

## 2019-11-18 LAB — TRIGLYCERIDES: Triglycerides: 163 mg/dL — ABNORMAL HIGH (ref <150)

## 2019-11-18 LAB — DIFFERENTIAL
Abs Immature Granulocytes: 0.01 10*3/uL (ref 0.00–0.07)
Basophils Absolute: 0 10*3/uL (ref 0.0–0.1)
Basophils Relative: 0 %
Eosinophils Absolute: 0.4 10*3/uL (ref 0.0–0.5)
Eosinophils Relative: 7 %
Immature Granulocytes: 0 %
Lymphocytes Relative: 22 %
Lymphs Abs: 1.2 10*3/uL (ref 0.7–4.0)
Monocytes Absolute: 0.6 10*3/uL (ref 0.1–1.0)
Monocytes Relative: 10 %
Neutro Abs: 3.4 10*3/uL (ref 1.7–7.7)
Neutrophils Relative %: 61 %

## 2019-11-18 LAB — MAGNESIUM: Magnesium: 1.7 mg/dL (ref 1.7–2.4)

## 2019-11-18 LAB — PREALBUMIN: Prealbumin: 20 mg/dL (ref 18–38)

## 2019-11-18 LAB — PHOSPHORUS: Phosphorus: 4.5 mg/dL (ref 2.5–4.6)

## 2019-11-18 MED ORDER — TRAVASOL 10 % IV SOLN
INTRAVENOUS | Status: AC
  Filled 2019-11-18: qty 999.6

## 2019-11-18 NOTE — Progress Notes (Signed)
14 Days Post-Op  Subjective: No new issues.  NGT seems like it is falling out and likely not in her stomach.  No increase in ileostomy output currently.  ROS: See above, otherwise other systems negative  Objective: Vital signs in last 24 hours: Temp:  [97.7 F (36.5 C)-98.8 F (37.1 C)] 98.3 F (36.8 C) (09/27 0550) Pulse Rate:  [70-79] 70 (09/27 0550) Resp:  [14-18] 14 (09/27 0550) BP: (116-131)/(77-87) 116/77 (09/27 0550) SpO2:  [97 %-100 %] 97 % (09/27 0550) Last BM Date: 11/17/19  Intake/Output from previous day: 09/26 0701 - 09/27 0700 In: 2446 [P.O.:160; I.V.:1821.1; IV Piggyback:464.9] Out: 1450 [Emesis/NG output:1450] Intake/Output this shift: No intake/output data recorded.  PE: Abd: soft, appropriately tender, some BS, ileostomy stoma is pink, with about 100cc of bilious output from yesterday.  NGT in place with thicken succus present.  NGT appears mostly out.  Lab Results:  Recent Labs    11/18/19 0326  WBC 5.6  HGB 9.3*  HCT 30.6*  PLT 367   BMET Recent Labs    11/17/19 0622 11/18/19 0326  NA 139 138  K 4.0 4.0  CL 105 104  CO2 23 24  GLUCOSE 107* 107*  BUN 13 17  CREATININE 0.49 0.60  CALCIUM 9.5 9.6   PT/INR No results for input(s): LABPROT, INR in the last 72 hours. CMP     Component Value Date/Time   NA 138 11/18/2019 0326   K 4.0 11/18/2019 0326   CL 104 11/18/2019 0326   CO2 24 11/18/2019 0326   GLUCOSE 107 (H) 11/18/2019 0326   BUN 17 11/18/2019 0326   CREATININE 0.60 11/18/2019 0326   CREATININE 0.68 10/14/2010 1059   CALCIUM 9.6 11/18/2019 0326   PROT 6.9 11/18/2019 0326   ALBUMIN 3.1 (L) 11/18/2019 0326   AST 19 11/18/2019 0326   ALT 13 11/18/2019 0326   ALKPHOS 103 11/18/2019 0326   BILITOT 0.5 11/18/2019 0326   GFRNONAA >60 11/18/2019 0326   GFRAA >60 11/18/2019 0326   Lipase     Component Value Date/Time   LIPASE 18 09/19/2019 1459       Studies/Results: No results  found.  Anti-infectives: Anti-infectives (From admission, onward)   Start     Dose/Rate Route Frequency Ordered Stop   11/14/19 1600  piperacillin-tazobactam (ZOSYN) IVPB 3.375 g        3.375 g 12.5 mL/hr over 240 Minutes Intravenous Every 8 hours 11/14/19 1514     11/03/19 0845  cefoTEtan (CEFOTAN) 2 g in sodium chloride 0.9 % 100 mL IVPB  Status:  Discontinued        2 g 200 mL/hr over 30 Minutes Intravenous On call to O.R. 11/03/19 0831 11/03/19 0842   10/25/19 1000  vancomycin (VANCOCIN) IVPB 1000 mg/200 mL premix  Status:  Discontinued        1,000 mg 200 mL/hr over 60 Minutes Intravenous Every 12 hours 10/24/19 1835 10/28/19 1727   10/24/19 2300  piperacillin-tazobactam (ZOSYN) IVPB 3.375 g  Status:  Discontinued        3.375 g 12.5 mL/hr over 240 Minutes Intravenous Every 8 hours 10/24/19 1835 11/13/19 1557   10/24/19 2000  vancomycin (VANCOREADY) IVPB 1500 mg/300 mL  Status:  Discontinued        1,500 mg 150 mL/hr over 120 Minutes Intravenous  Once 10/24/19 1835 10/24/19 1836   10/24/19 2000  vancomycin (VANCOREADY) IVPB 1500 mg/300 mL        1,500 mg 150 mL/hr over  120 Minutes Intravenous  Once 10/24/19 1836 10/24/19 2229   10/24/19 1730  piperacillin-tazobactam (ZOSYN) IVPB 3.375 g        3.375 g 100 mL/hr over 30 Minutes Intravenous  Once 10/24/19 1721 10/24/19 1810       Assessment/Plan COPD History of seizures Anxiety/depression Anemia - Hgb stable Hypokalemia-resolved   POD14, s/pLAPAROSCOPIC diverting loop ileostomy- 11/04/2019 - [place] complicated diverticulitis, possible malignancy, colovaginal fistula -undrained abscess in abdomen is improving on her follow up CT scan yesterday. -unfortunately, she still has a lot of inflammatory changes in her RLQ causing at least a partial blockage of her intestines causing significant back up of enteric contents. cont NGT.   -will check NGT placement today -likely plan to repeat a CT scan in the next couple of  days, about a week from last scan to see if we are making any progress. -PICC and TNA while NPO -colovaginal fistula still present but improving as drainage is less after surgery  FEN -NPO/NGT/IVFs/TNA VTE -Lovenox ID -zosyn 9/2 -->continue due to persistent, undrainable abscess in pelvis, improving   LOS: 25 days    Letha Cape , [facility] 11/18/2019, 9:03 AM Please see Amion for pager number during day hours 7:00am-4:30pm or 7:00am -11:30am on weekends

## 2019-11-18 NOTE — Consult Note (Addendum)
WOC Nurse ostomy follow up Stoma type/location: Ileostomy stoma is red and viable, 1 1/2 inches, above skin level.  Peristomal assessment: intact skin surrounding Output: mod amt liquid green stool  Ostomy pouching: 2pc.  Education provided:  Pt was able to apply pouch and barrier ring with minimal assistance and  Open and empty without assistance. Applied 2 piece (2 3/4 inch) pouch and barrier ring to attempt to maintain a seal.  Educational materials left in room and 5 sets of barrier rings/wafers/pouches. Reviewed pouching routines, dietary precautions, importance of avoiding dehydration, and ordering supplies.  Pt verbalizes understanding.  Enrolled patient in [place] Secure Start Discharge program: Yes, previously Cammie Mcgee MSN, RN, CWOCN, [place], CNS [phone]

## 2019-11-18 NOTE — Progress Notes (Signed)
PROGRESS NOTE    Susan Todd  YSA:[id number] DOB: January 25, 1966 DOA: 10/24/2019 PCP: Georgann Housekeeper, MD    Chief Complaint  Patient presents with  . Abdominal Pain    Brief Narrative:  54 year old lady prior history of hypertension, hyperlipidemia, anxiety, depression, seizure disorder, COPD presents on 9/2 with worsening abdominal pain.  CT of the abdomen pelvis showed several abscesses within the abdomen along with colovaginal fistula loss.  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  GI and general surgery were consulted.  Patient underwent flex sigmoidoscopy on 10/30/2019 showing diverticulosis in the rectosigmoid and sigmoid colon along with stenosis and narrowing.  GI recommended outpatient follow-up with her colonoscopy.  Repeat CT of the abdomen pelvis on 9/9 showed central loculated abscess within the pelvis increased in size and not amenable to percutaneous drainage given its location.  She underwent diagnostic laparotomy with laparoscopic diverting loop ileostomy on 11/04/2019.  MRI of the pelvis on 11/05/2019 demonstrated abscess/fistula cavity in the central pelvis containing air and fluid interspersed between the mid sigmoid colon and rectum measuring 3.7 into 3.1 cm suspicious for abscess.   General surgery, on board, pt continues to have some nausea, and abd pain.  abd x ray unremarkable. CT abd and pelvis  is positive for small bowel obstruction. Transition point is identified within the right lower quadrant of the abdomen in the area of the inflamed appendix. She is made NPO and Ng tube placed. TPN started .  She continues to have brown fluid aspirated from the NG tube. Abdominal x-ray this morning recommends advancing the NG tube 3 cm for better output.  Patient resting comfortably in bed denies any new complaints.    Assessment & Plan:   Principal Problem:   Intra-abdominal  abscess (HCC) Active Problems:   Depression   Anxiety   Seizures (HCC)   COPD (chronic obstructive pulmonary disease) (HCC)   Seizure (HCC)   Sepsis secondary to intra-abdominal/pelvic abscess from suspected diverticulitis with colovaginal fistula Initial CT of the abdomen pelvis and MRI pelvis revealed multiple abscesses.  Not amenable to IR drainage given its location. S/p laparotomy with laparoscopic diverting loop ileostomy. Patient has been on IV Zosyn from 10/24/2019 till 11/13/2019, general surgery wants to continue the IV antibiotics for persistent abscess .  Continue with Protonix and GI cocktail.   Patient continues to have abdominal pain and distention and abdominal x-rays are unremarkable was followed with a CT of the abdomen and pelvis showing small bowel obstruction with transition point in the right lower quadrant at the site of the appendix.  NG tube was placed ,  general surgery on board and following. abd distention has improved, nausea has improved, able to tolerate clear liquids.  TPN while NPO.  Patient continues to have brown liquid /increased ileostomy output. Patient is feeling better overall but not back to baseline yet.  Abdominal x-ray this morning recommends advancing the NG tube 3 cm.  Surgery recommends repeat CT of the abdomen and pelvis in 2 to 3 days.    Anxiety and depression. Patient slightly anxious in the last 24 hours.  COPD:  No wheezing heard.  Continue with bronchodilators as needed   Hypokalemia, hypomagnesemia  Replaced   Hyperphosphatemia:  Replaced  Seizures:  Stable.   GERD On PPI IV daily.   Anemia of chronic disease/Macrocytic anemia;  Anemia panel reviewed.  Hemoglobin around 9.    essential hypertension:  Blood  pressure parameters well controlled.   DVT prophylaxis: lovenox.  Code Status: full code.  Family Communication: (none at bedside) Disposition:   Status is: Inpatient  Remains inpatient appropriate  because:Ongoing diagnostic testing needed not appropriate for outpatient work up, IV treatments appropriate due to intensity of illness or inability to take PO and Inpatient level of care appropriate due to severity of illness secondary to persistent SBO   Dispo: The patient is from: Home              Anticipated d/c is to: pending              Anticipated d/c date is: > 3 days              Patient currently is not medically stable to d/c.       Consultants:   General surgery  Procedures:  9/8-Flex sigmoidoscopy revealed diverticulosis in the rectosigmoid and sigmoid colon along with the stenosis and narrowing.  Biopsies from colon and rectum negative for inflammation or malignancy 9/13-diagnostic laparotomy with laparoscopic diverting loop ileostomy Antimicrobials:  Antibiotics Given (last 72 hours)    Date/Time Action Medication Dose Rate   11/15/19 1759 New Bag/Given   piperacillin-tazobactam (ZOSYN) IVPB 3.375 g 3.375 g 12.5 mL/hr   11/16/19 0128 New Bag/Given   piperacillin-tazobactam (ZOSYN) IVPB 3.375 g 3.375 g 12.5 mL/hr   11/16/19 1048 New Bag/Given   piperacillin-tazobactam (ZOSYN) IVPB 3.375 g 3.375 g 12.5 mL/hr   11/16/19 2324 New Bag/Given   piperacillin-tazobactam (ZOSYN) IVPB 3.375 g 3.375 g 12.5 mL/hr   11/17/19 0648 New Bag/Given   piperacillin-tazobactam (ZOSYN) IVPB 3.375 g 3.375 g 12.5 mL/hr   11/17/19 1524 New Bag/Given   piperacillin-tazobactam (ZOSYN) IVPB 3.375 g 3.375 g 12.5 mL/hr   11/17/19 2120 New Bag/Given   piperacillin-tazobactam (ZOSYN) IVPB 3.375 g 3.375 g 12.5 mL/hr   11/18/19 0535 New Bag/Given   piperacillin-tazobactam (ZOSYN) IVPB 3.375 g 3.375 g 12.5 mL/hr   11/18/19 1437 New Bag/Given   piperacillin-tazobactam (ZOSYN) IVPB 3.375 g 3.375 g 12.5 mL/hr       Subjective: Patient resting comfortably in bed not in any distress no new complaints.   Objective: Vitals:   11/17/19 1733 11/17/19 2128 11/18/19 0550 11/18/19 1323  BP:  130/82 123/87 116/77 115/82  Pulse: 76 79 70 78  Resp: 18 16 14 14   Temp: 98.8 F (37.1 C) 97.7 F (36.5 C) 98.3 F (36.8 C) 98.7 F (37.1 C)  TempSrc: Oral Oral Oral Oral  SpO2: 100% 98% 97% 99%  Weight:      Height:        Intake/Output Summary (Last 24 hours) at 11/18/2019 1631 Last data filed at 11/18/2019 1400 Gross per 24 hour  Intake 2278.73 ml  Output 1800 ml  Net 478.73 ml   Filed Weights   10/24/19 0751  Weight: 78.5 kg    Examination:  General exam:comfortable, not in any kind of distress Respiratory system: Air entry fair bilateral, no wheezing or rhonchi Cardiovascular system: S1-S2 heard, regular rate rhythm, no JVD Gastrointestinal system: Abdomen is soft, nontender, distention has improved NG tube in place, ileostomy in place. Central nervous system: Alert and oriented Extremities: No cyanosis or clubbing Skin: No ulcers seen Psychiatry: Mood is appropriate  Data Reviewed: I have personally reviewed following labs and imaging studies  CBC: Recent Labs  Lab 11/12/19 0357 11/15/19 0345 11/18/19 0326  WBC 6.8 5.9 5.6  NEUTROABS  --  3.7 3.4  HGB 9.1* 9.3* 9.3*  HCT 29.9* 29.6* 30.6*  MCV 100.7* 99.7 99.0  PLT 424* 469* 367    Basic Metabolic Panel: Recent Labs  Lab 11/14/19 0954 11/15/19 0345 11/16/19 0645 11/17/19 0622 11/18/19 0326  NA 138 139 138 139 138  K 3.8 3.4* 4.0 4.0 4.0  CL 103 106 105 105 104  CO2 26 24 23 23 24   GLUCOSE 104* 113* 119* 107* 107*  BUN <5* <5* 8 13 17   CREATININE 0.74 0.62 0.48 0.49 0.60  CALCIUM 9.2 8.9 9.4 9.5 9.6  MG 1.7 1.7 1.7 1.7 1.7  PHOS 4.3 4.0 3.9 4.5 4.5    GFR: Estimated Creatinine Clearance: 86.4 mL/min (by C-G formula based on SCr of 0.6 mg/dL).  Liver Function Tests: Recent Labs  Lab 11/14/19 0954 11/15/19 0345 11/16/19 0645 11/17/19 0622 11/18/19 0326  AST 24 20 16 16 19   ALT 14 12 12 11 13   ALKPHOS 107 90 88 92 103  BILITOT 0.5 0.4 0.2* 0.3 0.5  PROT 6.9 6.0* 6.3* 6.5 6.9    ALBUMIN 3.2* 2.8* 3.0* 3.0* 3.1*    CBG: Recent Labs  Lab 11/16/19 1207 11/16/19 1801 11/16/19 2322 11/17/19 0558 11/17/19 1202  GLUCAP 113* 119* 138* 104* 111*     No results found for this or any previous visit (from the past 240 hour(s)).       Radiology Studies: DG Abd Portable 1V  Result Date: 11/18/2019 CLINICAL DATA:  Nasogastric tube placement EXAM: PORTABLE ABDOMEN - 1 VIEW COMPARISON:  Portable exam 1000 hours compared to 11/12/2019 FINDINGS: Tip of nasogastric tube projects over stomach though the proximal side-port is at or just above the gastroesophageal junction, recommend advancing tube 3 cm. Air-filled loops of small bowel in the upper abdomen. Lung bases clear. Scoliosis and degenerative changes of lumbar spine. IMPRESSION: Recommend advancing nasogastric tube 3 cm. Electronically Signed   By: 11/19/19 M.D.   On: 11/18/2019 10:56        Scheduled Meds: . Chlorhexidine Gluconate Cloth  6 each Topical Daily  . enoxaparin (LOVENOX) injection  40 mg Subcutaneous Q24H  . menthol-cetylpyridinium  1 lozenge Oral Q2H while awake  . metoprolol tartrate  5 mg Intravenous Q8H  . nicotine  7 mg Transdermal Daily  . pantoprazole (PROTONIX) IV  40 mg Intravenous Q24H  . psyllium  1 packet Oral Daily  . sodium chloride flush  10-40 mL Intracatheter Q12H   Continuous Infusions: . methocarbamol (ROBAXIN) IV 1,000 mg (11/18/19 1333)  . piperacillin-tazobactam (ZOSYN)  IV 3.375 g (11/18/19 1437)  . TPN ADULT (ION) 85 mL/hr at 11/17/19 1726  . TPN ADULT (ION)       LOS: 25 days        11/20/2019, MD [facility]   To contact the attending provider between 7A-7P or the covering provider during after hours 7P-7A, please log into the web site [email or web address] and access using universal [place] password for that web site. If you do not have the password, please call the hospital operator.  11/18/2019, 4:31 PM

## 2019-11-18 NOTE — Progress Notes (Signed)
PHARMACY - TOTAL PARENTERAL NUTRITION CONSULT NOTE   Indication: Prolonged ileus, SBO  Patient Measurements: Height: 5\' 8"  (172.7 cm) Weight: 78.5 kg (173 lb 1 oz) IBW/kg (Calculated) : 63.9 TPN AdjBW (KG): 78.5 Body mass index is 26.31 kg/m.  Assessment: Patient is a 54 y.o F who was instructed by her gastroenterologist to come to the ED on 9/2 for treatment of intra-abdominal abscesses.  She underwent flex sigmoidoscopy on 9/8 with biopsies taken and noted diverticular disease.  She subsequently underwent lap diverting loop ileostomy on 9/13.  Abdominal CT on 9/22 showed SBO.  Pharmacy is consulted on 9/23 to start TPN.  Glucose / Insulin: CBGs controlled; on sensitive insulin correction coverage Electrolytes: WNL Renal: Scr wnl; BUN 17 LFTs / TGs: LFTs wnl, TG 163 (9/27) Prealbumin / albumin: albumin 3.1; pre-albumin inc to 20 (9/27) Intake / Output; MIVF:  - remains w/ high NGT output, 1450 mL/24hr.  Minimal ileostomy output, 100 mL/24 hrs. - net I/O inaccurate, unmeasured UOP x4  - no mIVF (d/c 9/24) GI Imaging: - 9/1 abd CT: Enlarging cystic, fluid containing area in the RIGHT adnexa may represent worsening of abscess adjacent to or involving the ovary. 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 a centrally located abscess within the pelvis which has demonstrated interval increase in size. - 9/14 pelvis MRI: There is a redemonstrated abscess or fistula cavity in the central pelvis containing air and fluid interposed between the mid sigmoid colon and rectum. Findings are again suspicious of abscess or fistula related to prior tip appendicitis or alternately diverticulitis and not significantly changed compared to prior  examinations. The presence of air and fluid in the vagina is again highly suspicious for vaginal fistula. - 9/22 abd CT: positive for small bowel obstruction Surgeries / Procedures:  - 9/8 flex sigmoidoscopy: biopsies taken and noted diverticular disease - 9/13: lap diverting loop ileostomy on 9/13  Central access: PICC placed 9/23 TPN start date: 11/14/2019  Nutritional Goals (per RD recommendation on 9/23): kCal: 1900-2100, Protein: 95-105, Fluid: 2L/day Goal TPN rate is 85 mL/hr (provides 100 g of protein and 2060 kcals per day)  Current Nutrition:  - currently NPO w/ ice chips  Plan:  - Continue TPN at goal rate of 85 ml/hr  - Electrolytes in TPN: 75mEq/L of Na, 35mEq/L of K, 59mEq/L of Ca, 47mEq/L of Mg, and 72mmol/L of Phos. Cl:Ac ratio 1:1 - Add standard MVI and trace elements to TPN - d/c SSI/CBG checks due to glucose within goal, stable - Monitor TPN labs on Mon/Thurs  12m PharmD, BCPS Clinical Pharmacist [facility] main pharmacy [phone] 11/18/2019 9:34 AM

## 2019-11-19 MED ORDER — LORAZEPAM 2 MG/ML IJ SOLN
0.5000 mg | INTRAMUSCULAR | Status: DC | PRN
  Administered 2019-11-19: 1 mg via INTRAVENOUS
  Administered 2019-11-19: 0.5 mg via INTRAVENOUS
  Administered 2019-11-19 – 2019-11-20 (×8): 1 mg via INTRAVENOUS
  Administered 2019-11-21: 0.5 mg via INTRAVENOUS
  Administered 2019-11-21 – 2019-11-24 (×3): 1 mg via INTRAVENOUS
  Filled 2019-11-19 (×13): qty 1

## 2019-11-19 MED ORDER — TRAVASOL 10 % IV SOLN
INTRAVENOUS | Status: AC
  Filled 2019-11-19: qty 999.6

## 2019-11-19 MED ORDER — LORAZEPAM 2 MG/ML IJ SOLN: 0.5000 mg | INTRAMUSCULAR | Status: DC | PRN

## 2019-11-19 NOTE — Progress Notes (Signed)
PHARMACY - TOTAL PARENTERAL NUTRITION CONSULT NOTE   Indication: Prolonged ileus, SBO  Patient Measurements: Height: 5\' 8"  (172.7 cm) Weight: 78.5 kg (173 lb 1 oz) IBW/kg (Calculated) : 63.9 TPN AdjBW (KG): 78.5 Body mass index is 26.31 kg/m.  Assessment: Patient is a 54 y.o F who was instructed by her gastroenterologist to come to the ED on 9/2 for treatment of intra-abdominal abscesses.  She underwent flex sigmoidoscopy on 9/8 with biopsies taken and noted diverticular disease.  She subsequently underwent lap diverting loop ileostomy on 9/13.  Abdominal CT on 9/22 showed SBO.  Pharmacy is consulted on 9/23 to start TPN.  Glucose / Insulin: CBGs controlled; on sensitive insulin correction coverage Electrolytes: WNL (9/28) Renal: Scr wnl; BUN 17 (9/28) LFTs / TGs: LFTs wnl, TG 163 (9/27) Prealbumin / albumin: albumin 3.1; pre-albumin inc to 20 (9/27) Intake / Output; MIVF:  - NGT output decreased to 1075 mL/24 hrs, ileostomy output increased to 250 mL/24 hrs. - net I/O inaccurate, unmeasured UOP x7 - no mIVF (d/c 9/24) GI Imaging: - 9/1 abd CT: Enlarging cystic, fluid containing area in the RIGHT adnexa may represent worsening of abscess adjacent to or involving the ovary. 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 a centrally located abscess within the pelvis which has demonstrated interval increase in size. - 9/14 pelvis MRI: There is a redemonstrated abscess or fistula cavity in the central pelvis containing air and fluid interposed between the mid sigmoid colon and rectum. Findings are again suspicious of abscess or fistula related to prior tip appendicitis or alternately diverticulitis and not significantly changed compared to  prior examinations. The presence of air and fluid in the vagina is again highly suspicious for vaginal fistula. - 9/22 abd CT: positive for small bowel obstruction Surgeries / Procedures:  - 9/8 flex sigmoidoscopy: biopsies taken and noted diverticular disease - 9/13: lap diverting loop ileostomy on 9/13  Central access: PICC placed 9/23 TPN start date: 11/14/2019  Nutritional Goals (per RD recommendation on 9/23): kCal: 1900-2100, Protein: 95-105, Fluid: 2L/day Goal TPN rate is 85 mL/hr (provides 100 g of protein and 2060 kcals per day)  Current Nutrition:  - currently NPO w/ ice chips  Plan:  - Continue TPN at goal rate of 85 ml/hr  - Electrolytes in TPN: 65mEq/L of Na, 66mEq/L of K, 41mEq/L of Ca, 24mEq/L of Mg, and 27mmol/L of Phos. Cl:Ac ratio 1:1 - Standard MVI and trace elements to TPN - d/c SSI/CBG checks due to glucose within goal, stable - Monitor TPN labs on Mon/Thurs  12m PharmD, BCPS Clinical Pharmacist [facility] main pharmacy [phone] 11/19/2019 9:45 AM

## 2019-11-19 NOTE — Progress Notes (Signed)
PROGRESS NOTE    Susan Todd  MWU:[id number] DOB: Oct 23, 1965 DOA: 10/24/2019 PCP: Georgann Housekeeper, MD    Chief Complaint  Patient presents with  . Abdominal Pain    Brief Narrative:  54 year old lady prior history of hypertension, hyperlipidemia, anxiety, depression, seizure disorder, COPD presents on 9/2 with worsening abdominal pain.  CT of the abdomen pelvis showed several abscesses within the abdomen along with colovaginal fistula loss.  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  GI and general surgery were consulted.  Patient underwent flex sigmoidoscopy on 10/30/2019 showing diverticulosis in the rectosigmoid and sigmoid colon along with stenosis and narrowing.  GI recommended outpatient follow-up with her colonoscopy.  Repeat CT of the abdomen pelvis on 9/9 showed central loculated abscess within the pelvis increased in size and not amenable to percutaneous drainage given its location.  She underwent diagnostic laparotomy with laparoscopic diverting loop ileostomy on 11/04/2019.  MRI of the pelvis on 11/05/2019 demonstrated abscess/fistula cavity in the central pelvis containing air and fluid interspersed between the mid sigmoid colon and rectum measuring 3.7 into 3.1 cm suspicious for abscess.   General surgery, on board and following the patient. On 9/22 CT abd and pelvis  Shows small bowel obstruction, with transition point  identified within the right lower quadrant of the abdomen in the area of the inflamed appendix. She is made NPO and Ng tube placed. TPN started for nutrition .  She continues to have brown fluid aspirated from the NG tube. Abdominal x-ray on 9/27 recommends advancing the NG tube 3 cm for better output.  Unfortunately the obstruction is proximal to her ileostomy. She will probably need repeat imaging with a CT vs laparotomy .   Patient seen and examined, teary appears  frustrated that her obstruction is not getting better.  Requested to increase her IV Ativan.   Assessment & Plan:   Principal Problem:   Intra-abdominal abscess (HCC) Active Problems:   Depression   Anxiety   Seizures (HCC)   COPD (chronic obstructive pulmonary disease) (HCC)   Seizure (HCC)   Sepsis secondary to intra-abdominal/pelvic abscess from suspected diverticulitis with colovaginal fistula Initial CT of the abdomen pelvis and MRI pelvis revealed multiple abscesses.  Not amenable to IR drainage given its location. S/p laparotomy with laparoscopic diverting loop ileostomy. Patient has been on IV Zosyn from 10/24/2019 till 11/13/2019, general surgery wants to continue the IV antibiotics for persistent abscess. Patient continues to have abdominal pain and distention and abdominal x-rays are unremarkable was followed with a CT of the abdomen and pelvis on 11/13/19 showing small bowel obstruction with transition point in the right lower quadrant at the site of the appendix.  NG tube was placed , connected to intermittent suction.   Patient is feeling better overall but not back to baseline yet.  She is made NPO and Ng tube placed. TPN started for nutrition .  She continues to have brown fluid aspirated from the NG tube. Abdominal x-ray on 9/27 recommends advancing the NG tube 3 cm for better output.  Unfortunately the obstruction is proximal to her ileostomy. She will probably need repeat imaging with a CT abd and pelvis vs laparotomy She remains afebrile and WBC count within normal limits   Anxiety and depression. Increased ativan to 1 mg every 4 hours prn.   COPD:  No wheezing heard.  Continue with bronchodilators as needed   Hypokalemia, hypomagnesemia  Replaced  Hyperphosphatemia:  Replaced  Seizures:  None this admission.   GERD On PPI IV daily.   Anemia of chronic disease/Macrocytic anemia;  Anemia panel reviewed.  Hemoglobin stable around 9.    Essential  hypertension Pressure parameters are optimal   DVT prophylaxis: lovenox.  Code Status: full code.  Family Communication: (none at bedside) Disposition:   Status is: Inpatient  Remains inpatient appropriate because:Ongoing diagnostic testing needed not appropriate for outpatient work up, IV treatments appropriate due to intensity of illness or inability to take PO and Inpatient level of care appropriate due to severity of illness secondary to persistent SBO, NG tube, TPN  Dispo: The patient is from: Home              Anticipated d/c is to: pending              Anticipated d/c date is: > 3 days              Patient currently is not medically stable to d/c.       Consultants:   General surgery  Procedures:  9/8-Flex sigmoidoscopy revealed diverticulosis in the rectosigmoid and sigmoid colon along with the stenosis and narrowing.  Biopsies from colon and rectum negative for inflammation or malignancy 9/13-diagnostic laparotomy with laparoscopic diverting loop ileostomy Antimicrobials:  Antibiotics Given (last 72 hours)    Date/Time Action Medication Dose Rate   11/16/19 2324 New Bag/Given   piperacillin-tazobactam (ZOSYN) IVPB 3.375 g 3.375 g 12.5 mL/hr   11/17/19 0648 New Bag/Given   piperacillin-tazobactam (ZOSYN) IVPB 3.375 g 3.375 g 12.5 mL/hr   11/17/19 1524 New Bag/Given   piperacillin-tazobactam (ZOSYN) IVPB 3.375 g 3.375 g 12.5 mL/hr   11/17/19 2120 New Bag/Given   piperacillin-tazobactam (ZOSYN) IVPB 3.375 g 3.375 g 12.5 mL/hr   11/18/19 0535 New Bag/Given   piperacillin-tazobactam (ZOSYN) IVPB 3.375 g 3.375 g 12.5 mL/hr   11/18/19 1437 New Bag/Given   piperacillin-tazobactam (ZOSYN) IVPB 3.375 g 3.375 g 12.5 mL/hr   11/18/19 2223 New Bag/Given   piperacillin-tazobactam (ZOSYN) IVPB 3.375 g 3.375 g 12.5 mL/hr   11/19/19 0616 New Bag/Given   piperacillin-tazobactam (ZOSYN) IVPB 3.375 g 3.375 g 12.5 mL/hr   11/19/19 1417 New Bag/Given   piperacillin-tazobactam  (ZOSYN) IVPB 3.375 g 3.375 g 12.5 mL/hr       Subjective: No chest pain or sob. Anxious.    Objective: Vitals:   11/18/19 2115 11/19/19 0534 11/19/19 1022 11/19/19 1337  BP: (!) 132/92 115/76  111/81  Pulse: 88 73  82  Resp: 18 18    Temp: 98.4 F (36.9 C) 98 F (36.7 C)  98.3 F (36.8 C)  TempSrc: Oral Oral  Oral  SpO2: 97% 97%  98%  Weight:   83.2 kg   Height:        Intake/Output Summary (Last 24 hours) at 11/19/2019 1638 Last data filed at 11/19/2019 1336 Gross per 24 hour  Intake 1843.66 ml  Output 1177 ml  Net 666.66 ml   Filed Weights   10/24/19 0751 11/19/19 1022  Weight: 78.5 kg 83.2 kg    Examination:  General exam: Alert laying in bed has NG tube, not in distress Respiratory system: Air entry fair bilateral, no wheezing or rhonchi Cardiovascular system: S1-S2 heard, regular rate rhythm, no JVD Gastrointestinal system: Abdomen is soft, mildly distended, ileostomy with no stool, mildly tender Central nervous system: Alert and oriented, grossly nonfocal Extremities: No pedal edema or cyanosis Skin: No ulcers seen Psychiatry: Teary, appears anxious  Data Reviewed: I have personally reviewed following labs and imaging studies  CBC: Recent Labs  Lab 11/15/19 0345 11/18/19 0326  WBC 5.9 5.6  NEUTROABS 3.7 3.4  HGB 9.3* 9.3*  HCT 29.6* 30.6*  MCV 99.7 99.0  PLT 469* 367    Basic Metabolic Panel: Recent Labs  Lab 11/14/19 0954 11/15/19 0345 11/16/19 0645 11/17/19 0622 11/18/19 0326  NA 138 139 138 139 138  K 3.8 3.4* 4.0 4.0 4.0  CL 103 106 105 105 104  CO2 26 24 23 23 24   GLUCOSE 104* 113* 119* 107* 107*  BUN <5* <5* 8 13 17   CREATININE 0.74 0.62 0.48 0.49 0.60  CALCIUM 9.2 8.9 9.4 9.5 9.6  MG 1.7 1.7 1.7 1.7 1.7  PHOS 4.3 4.0 3.9 4.5 4.5    GFR: Estimated Creatinine Clearance: 88.8 mL/min (by C-G formula based on SCr of 0.6 mg/dL).  Liver Function Tests: Recent Labs  Lab 11/14/19 0954 11/15/19 0345 11/16/19 0645  11/17/19 0622 11/18/19 0326  AST 24 20 16 16 19   ALT 14 12 12 11 13   ALKPHOS 107 90 88 92 103  BILITOT 0.5 0.4 0.2* 0.3 0.5  PROT 6.9 6.0* 6.3* 6.5 6.9  ALBUMIN 3.2* 2.8* 3.0* 3.0* 3.1*    CBG: Recent Labs  Lab 11/16/19 1207 11/16/19 1801 11/16/19 2322 11/17/19 0558 11/17/19 1202  GLUCAP 113* 119* 138* 104* 111*     No results found for this or any previous visit (from the past 240 hour(s)).       Radiology Studies: DG Abd Portable 1V  Result Date: 11/18/2019 CLINICAL DATA:  Nasogastric tube placement EXAM: PORTABLE ABDOMEN - 1 VIEW COMPARISON:  Portable exam 1000 hours compared to 11/12/2019 FINDINGS: Tip of nasogastric tube projects over stomach though the proximal side-port is at or just above the gastroesophageal junction, recommend advancing tube 3 cm. Air-filled loops of small bowel in the upper abdomen. Lung bases clear. Scoliosis and degenerative changes of lumbar spine. IMPRESSION: Recommend advancing nasogastric tube 3 cm. Electronically Signed   By: 11/19/19 M.D.   On: 11/18/2019 10:56        Scheduled Meds: . Chlorhexidine Gluconate Cloth  6 each Topical Daily  . enoxaparin (LOVENOX) injection  40 mg Subcutaneous Q24H  . menthol-cetylpyridinium  1 lozenge Oral Q2H while awake  . metoprolol tartrate  5 mg Intravenous Q8H  . nicotine  7 mg Transdermal Daily  . pantoprazole (PROTONIX) IV  40 mg Intravenous Q24H  . psyllium  1 packet Oral Daily  . sodium chloride flush  10-40 mL Intracatheter Q12H   Continuous Infusions: . methocarbamol (ROBAXIN) IV 1,000 mg (11/19/19 1338)  . piperacillin-tazobactam (ZOSYN)  IV 3.375 g (11/19/19 1417)  . TPN ADULT (ION) 85 mL/hr at 11/18/19 1859  . TPN ADULT (ION)       LOS: 26 days        11/20/2019, MD [facility]   To contact the attending provider between 7A-7P or the covering provider during after hours 7P-7A, please log into the web site [email or web address] and access using universal [place]  password for that web site. If you do not have the password, please call the hospital operator.  11/19/2019, 4:37 PM

## 2019-11-19 NOTE — Progress Notes (Signed)
15 Days Post-Op  Subjective: No change and no new complaints.  Still with about 200ish cc from ileostomy and about 850cc of NGT output.  ROS: See above, otherwise other systems negative  Objective: Vital signs in last 24 hours: Temp:  [98 F (36.7 C)-98.7 F (37.1 C)] 98 F (36.7 C) (09/28 0534) Pulse Rate:  [73-88] 73 (09/28 0534) Resp:  [14-18] 18 (09/28 0534) BP: (115-132)/(76-92) 115/76 (09/28 0534) SpO2:  [97 %-99 %] 97 % (09/28 0534) Last BM Date: 11/18/19  Intake/Output from previous day: 09/27 0701 - 09/28 0700 In: 2160.5 [I.V.:1615.5; IV Piggyback:545.1] Out: 1075 [Emesis/NG output:850; Stool:225] Intake/Output this shift: No intake/output data recorded.  PE: Abd: soft, stable, ileostomy with some enteric output, stoma pink and viable, NGT in place with enteric succus present  Lab Results:  Recent Labs    11/18/19 0326  WBC 5.6  HGB 9.3*  HCT 30.6*  PLT 367   BMET Recent Labs    11/17/19 0622 11/18/19 0326  NA 139 138  K 4.0 4.0  CL 105 104  CO2 23 24  GLUCOSE 107* 107*  BUN 13 17  CREATININE 0.49 0.60  CALCIUM 9.5 9.6   PT/INR No results for input(s): LABPROT, INR in the last 72 hours. CMP     Component Value Date/Time   NA 138 11/18/2019 0326   K 4.0 11/18/2019 0326   CL 104 11/18/2019 0326   CO2 24 11/18/2019 0326   GLUCOSE 107 (H) 11/18/2019 0326   BUN 17 11/18/2019 0326   CREATININE 0.60 11/18/2019 0326   CREATININE 0.68 10/14/2010 1059   CALCIUM 9.6 11/18/2019 0326   PROT 6.9 11/18/2019 0326   ALBUMIN 3.1 (L) 11/18/2019 0326   AST 19 11/18/2019 0326   ALT 13 11/18/2019 0326   ALKPHOS 103 11/18/2019 0326   BILITOT 0.5 11/18/2019 0326   GFRNONAA >60 11/18/2019 0326   GFRAA >60 11/18/2019 0326   Lipase     Component Value Date/Time   LIPASE 18 09/19/2019 1459       Studies/Results: DG Abd Portable 1V  Result Date: 11/18/2019 CLINICAL DATA:  Nasogastric tube placement EXAM: PORTABLE ABDOMEN - 1 VIEW COMPARISON:   Portable exam 1000 hours compared to 11/12/2019 FINDINGS: Tip of nasogastric tube projects over stomach though the proximal side-port is at or just above the gastroesophageal junction, recommend advancing tube 3 cm. Air-filled loops of small bowel in the upper abdomen. Lung bases clear. Scoliosis and degenerative changes of lumbar spine. IMPRESSION: Recommend advancing nasogastric tube 3 cm. Electronically Signed   By: Ulyses Southward M.D.   On: 11/18/2019 10:56    Anti-infectives: Anti-infectives (From admission, onward)   Start     Dose/Rate Route Frequency Ordered Stop   11/14/19 1600  piperacillin-tazobactam (ZOSYN) IVPB 3.375 g        3.375 g 12.5 mL/hr over 240 Minutes Intravenous Every 8 hours 11/14/19 1514     11/03/19 0845  cefoTEtan (CEFOTAN) 2 g in sodium chloride 0.9 % 100 mL IVPB  Status:  Discontinued        2 g 200 mL/hr over 30 Minutes Intravenous On call to O.R. 11/03/19 0831 11/03/19 0842   10/25/19 1000  vancomycin (VANCOCIN) IVPB 1000 mg/200 mL premix  Status:  Discontinued        1,000 mg 200 mL/hr over 60 Minutes Intravenous Every 12 hours 10/24/19 1835 10/28/19 1727   10/24/19 2300  piperacillin-tazobactam (ZOSYN) IVPB 3.375 g  Status:  Discontinued  3.375 g 12.5 mL/hr over 240 Minutes Intravenous Every 8 hours 10/24/19 1835 11/13/19 1557   10/24/19 2000  vancomycin (VANCOREADY) IVPB 1500 mg/300 mL  Status:  Discontinued        1,500 mg 150 mL/hr over 120 Minutes Intravenous  Once 10/24/19 1835 10/24/19 1836   10/24/19 2000  vancomycin (VANCOREADY) IVPB 1500 mg/300 mL        1,500 mg 150 mL/hr over 120 Minutes Intravenous  Once 10/24/19 1836 10/24/19 2229   10/24/19 1730  piperacillin-tazobactam (ZOSYN) IVPB 3.375 g        3.375 g 100 mL/hr over 30 Minutes Intravenous  Once 10/24/19 1721 10/24/19 1810       Assessment/Plan COPD History of seizures Anxiety/depression Anemia - Hgb stable Hypokalemia-resolved   POD15, s/pLAPAROSCOPIC diverting loop  ileostomy- 11/04/2019 - [facility] complicated diverticulitis, possible malignancy, colovaginal fistula -undrained abscess in abdomen is improving on her follow up CT scan yesterday. -unfortunately, patient is not improving in regards to her partial obstruction.  This area is proximal to her ileostomy which is not allowing for her to be able to eat etc.   -we will plan to discuss her case with original surgeon to determine if we do more watchful waiting or if she is going to require a laparotomy with further intervention. -cont TNA for now -spent a long time with patient discussing anatomy and answering her questions.  It is ok for patient to go outside and get some fresh air.  Encouraged visits from best friends and family members to help get her through this given the duration she has been in the hospital so far.  I told her I was happy to return and explain the situation to her son if/when he comes so he can understand what is going on as well.  FEN -NPO/NGT/IVFs/TNA VTE -Lovenox ID -zosyn 9/2 -->continue due to persistent, undrainable abscess in pelvis, improving   LOS: 26 days    Letha Cape , [facility] Surgery 11/19/2019, 10:03 AM Please see Amion for pager number during day hours 7:00am-4:30pm or 7:00am -11:30am on weekends

## 2019-11-20 ENCOUNTER — Inpatient Hospital Stay (HOSPITAL_COMMUNITY): Payer: Self-pay

## 2019-11-20 LAB — COMPREHENSIVE METABOLIC PANEL
ALT: 38 U/L (ref 0–44)
AST: 50 U/L — ABNORMAL HIGH (ref 15–41)
Albumin: 3.2 g/dL — ABNORMAL LOW (ref 3.5–5.0)
Alkaline Phosphatase: 146 U/L — ABNORMAL HIGH (ref 38–126)
Anion gap: 12 (ref 5–15)
BUN: 19 mg/dL (ref 6–20)
CO2: 22 mmol/L (ref 22–32)
Calcium: 9.8 mg/dL (ref 8.9–10.3)
Chloride: 102 mmol/L (ref 98–111)
Creatinine, Ser: 0.57 mg/dL (ref 0.44–1.00)
GFR calc Af Amer: >60 mL/min (ref >60)
GFR calc non Af Amer: >60 mL/min (ref >60)
Glucose, Bld: 131 mg/dL — ABNORMAL HIGH (ref 70–99)
Potassium: 4.1 mmol/L (ref 3.5–5.1)
Sodium: 136 mmol/L (ref 135–145)
Total Bilirubin: 0.8 mg/dL (ref 0.3–1.2)
Total Protein: 7 g/dL (ref 6.5–8.1)

## 2019-11-20 LAB — CBC WITH DIFFERENTIAL/PLATELET
Abs Immature Granulocytes: 0.04 10*3/uL (ref 0.00–0.07)
Basophils Absolute: 0 10*3/uL (ref 0.0–0.1)
Basophils Relative: 0 %
Eosinophils Absolute: 0.5 10*3/uL (ref 0.0–0.5)
Eosinophils Relative: 6 %
HCT: 30.9 % — ABNORMAL LOW (ref 36.0–46.0)
Hemoglobin: 9.6 g/dL — ABNORMAL LOW (ref 12.0–15.0)
Immature Granulocytes: 1 %
Lymphocytes Relative: 12 %
Lymphs Abs: 1 10*3/uL (ref 0.7–4.0)
MCH: 29.5 pg (ref 26.0–34.0)
MCHC: 31.1 g/dL (ref 30.0–36.0)
MCV: 95.1 fL (ref 80.0–100.0)
Monocytes Absolute: 0.7 10*3/uL (ref 0.1–1.0)
Monocytes Relative: 8 %
Neutro Abs: 6.3 10*3/uL (ref 1.7–7.7)
Neutrophils Relative %: 73 %
Platelets: 350 10*3/uL (ref 150–400)
RBC: 3.25 MIL/uL — ABNORMAL LOW (ref 3.87–5.11)
RDW: 14 % (ref 11.5–15.5)
WBC: 8.6 10*3/uL (ref 4.0–10.5)
nRBC: 0 % (ref 0.0–0.2)

## 2019-11-20 MED ORDER — TRAVASOL 10 % IV SOLN
INTRAVENOUS | Status: AC
  Filled 2019-11-20: qty 999.6

## 2019-11-20 NOTE — Progress Notes (Signed)
16 Days Post-Op  Subjective: NGT causing a lot of pain today. Otherwise, no new complaints today.  Still with minimal ileostomy output.  ROS: See above, otherwise other systems negative  Objective: Vital signs in last 24 hours: Temp:  [98.3 F (36.8 C)] 98.3 F (36.8 C) (09/28 2139) Pulse Rate:  [82-93] 91 (09/29 0539) Resp:  [16] 16 (09/29 0539) BP: (105-123)/(70-87) 105/70 (09/29 0539) SpO2:  [97 %-98 %] 97 % (09/29 0539) Weight:  [83.2 kg] 83.2 kg (09/28 1022) Last BM Date: 11/18/19  Intake/Output from previous day: 09/28 0701 - 09/29 0700 In: 2593 [P.O.:120; I.V.:2021.7; IV Piggyback:451.3] Out: 1802 [Urine:1; Emesis/NG output:1700; Stool:101] Intake/Output this shift: Total I/O In: 0  Out: 500 [Emesis/NG output:500]  PE: Abd: soft, ileostomy with minimal bilious output, NGT with enteric output.  Lab Results:  Recent Labs    11/18/19 0326  WBC 5.6  HGB 9.3*  HCT 30.6*  PLT 367   BMET Recent Labs    11/18/19 0326  NA 138  K 4.0  CL 104  CO2 24  GLUCOSE 107*  BUN 17  CREATININE 0.60  CALCIUM 9.6   PT/INR No results for input(s): LABPROT, INR in the last 72 hours. CMP     Component Value Date/Time   NA 138 11/18/2019 0326   K 4.0 11/18/2019 0326   CL 104 11/18/2019 0326   CO2 24 11/18/2019 0326   GLUCOSE 107 (H) 11/18/2019 0326   BUN 17 11/18/2019 0326   CREATININE 0.60 11/18/2019 0326   CREATININE 0.68 10/14/2010 1059   CALCIUM 9.6 11/18/2019 0326   PROT 6.9 11/18/2019 0326   ALBUMIN 3.1 (L) 11/18/2019 0326   AST 19 11/18/2019 0326   ALT 13 11/18/2019 0326   ALKPHOS 103 11/18/2019 0326   BILITOT 0.5 11/18/2019 0326   GFRNONAA >60 11/18/2019 0326   GFRAA >60 11/18/2019 0326   Lipase     Component Value Date/Time   LIPASE 18 09/19/2019 1459       Studies/Results: No results found.  Anti-infectives: Anti-infectives (From admission, onward)   Start     Dose/Rate Route Frequency Ordered Stop   11/14/19 1600   piperacillin-tazobactam (ZOSYN) IVPB 3.375 g        3.375 g 12.5 mL/hr over 240 Minutes Intravenous Every 8 hours 11/14/19 1514     11/03/19 0845  cefoTEtan (CEFOTAN) 2 g in sodium chloride 0.9 % 100 mL IVPB  Status:  Discontinued        2 g 200 mL/hr over 30 Minutes Intravenous On call to O.R. 11/03/19 0831 11/03/19 0842   10/25/19 1000  vancomycin (VANCOCIN) IVPB 1000 mg/200 mL premix  Status:  Discontinued        1,000 mg 200 mL/hr over 60 Minutes Intravenous Every 12 hours 10/24/19 1835 10/28/19 1727   10/24/19 2300  piperacillin-tazobactam (ZOSYN) IVPB 3.375 g  Status:  Discontinued        3.375 g 12.5 mL/hr over 240 Minutes Intravenous Every 8 hours 10/24/19 1835 11/13/19 1557   10/24/19 2000  vancomycin (VANCOREADY) IVPB 1500 mg/300 mL  Status:  Discontinued        1,500 mg 150 mL/hr over 120 Minutes Intravenous  Once 10/24/19 1835 10/24/19 1836   10/24/19 2000  vancomycin (VANCOREADY) IVPB 1500 mg/300 mL        1,500 mg 150 mL/hr over 120 Minutes Intravenous  Once 10/24/19 1836 10/24/19 2229   10/24/19 1730  piperacillin-tazobactam (ZOSYN) IVPB 3.375 g  3.375 g 100 mL/hr over 30 Minutes Intravenous  Once 10/24/19 1721 10/24/19 1810       Assessment/Plan COPD History of seizures Anxiety/depression Anemia-Hgb stable Hypokalemia-resolved   POD16, s/pLAPAROSCOPIC diverting loop ileostomy- 11/04/2019 - [facility] complicated diverticulitis, possible malignancy, colovaginal fistula -undrained abscess in abdomen is improving on her follow up CT scan yesterday. -unfortunately, patient is not improving in regards to her partial obstruction.  This area is proximal to her ileostomy which is not allowing for her to be able to eat etc.   -at this we have discussed her case with Dr. Cliffton Asters as well and feel moving forward with a laparotomy with more definitive treatment is her best option given her failure to progress at this point. -we will tentatively plan this Thursday  or Friday pending scheduling and availability.  We will also need to contact urology as we will need stents placed preop as well. -we discussed this and she understands the plan and is agreeable.  FEN -NPO/NGT/IVFs/TNA VTE -Lovenox ID -zosyn 9/2 -->continue due to persistent, undrainable abscess in pelvis, improving   LOS: 27 days    Susan Todd , [facility] Surgery 11/20/2019, 10:20 AM Please see Amion for pager number during day hours 7:00am-4:30pm or 7:00am -11:30am on weekends

## 2019-11-20 NOTE — Progress Notes (Signed)
PHARMACY - TOTAL PARENTERAL NUTRITION CONSULT NOTE   Indication: Prolonged ileus, SBO  Patient Measurements: Height: 5\' 8"  (172.7 cm) Weight: 83.2 kg (183 lb 6.8 oz) IBW/kg (Calculated) : 63.9 TPN AdjBW (KG): 78.5 Body mass index is 27.89 kg/m.  Assessment: Patient is a 54 y.o F who was instructed by her gastroenterologist to come to the ED on 9/2 for treatment of intra-abdominal abscesses.  She underwent flex sigmoidoscopy on 9/8 with biopsies taken and noted diverticular disease.  She subsequently underwent lap diverting loop ileostomy on 9/13.  Abdominal CT on 9/22 showed SBO.  Pharmacy is consulted on 9/23 to start TPN.  Glucose / Insulin: CBGs were controlled, now d/c. Electrolytes: WNL (9/27) Renal: Scr wnl; BUN 17 (9/27) LFTs / TGs: LFTs wnl, TG 163 (9/27) Prealbumin / albumin: albumin 3.1; pre-albumin inc to 20 (9/27) Intake / Output; MIVF:  - NGT output up to 1800 mL/24 hrs, ileostomy output decreased to 100 mL/24 hrs. - net I/O inaccurate, unmeasured UOP x4 - no mIVF (d/c 9/24) GI Imaging: - 9/1 abd CT: Enlarging cystic, fluid containing area in the RIGHT adnexa may represent worsening of abscess adjacent to or involving the ovary. 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 a centrally located abscess within the pelvis which has demonstrated interval increase in size. - 9/14 pelvis MRI: Suspicious of abscess or fistula related to prior tip appendicitis or alternately diverticulitis.  The presence of air and fluid in the vagina is again highly suspicious for vaginal fistula. - 9/22 abd CT: positive for small bowel obstruction.  Decrease in size of gas and fluid collection.  Similar appearance of inflammatory changes of the  sigmoid colon with small fistula tract.  Surgeries / Procedures:  - 9/8 flex sigmoidoscopy: biopsies taken and noted diverticular disease - 9/13: lap diverting loop ileostomy on 9/13  Central access: PICC placed 9/23 TPN start date: 11/14/2019  Nutritional Goals (per RD recommendation on 9/23): kCal: 1900-2100, Protein: 95-105, Fluid: 2L/day Goal TPN rate is 85 mL/hr (provides 100 g of protein and 2060 kcals per day)  Current Nutrition:  - currently NPO w/ ice chips  Plan:  - Continue TPN at goal rate of 85 ml/hr  - Electrolytes in TPN: 70mEq/L of Na, 80mEq/L of K, 10mEq/L of Ca, 71mEq/L of Mg, and 88mmol/L of Phos. Cl:Ac ratio 1:1 - Standard MVI and trace elements to TPN - d/c SSI/CBG checks due to glucose within goal, stable - Monitor TPN labs on Mon/Thurs  12m PharmD, BCPS Clinical Pharmacist [facility] main pharmacy [phone] 11/20/2019 7:26 AM

## 2019-11-20 NOTE — Progress Notes (Signed)
PROGRESS NOTE  Susan Todd EHU:[id number] DOB: 1965-03-04 DOA: 10/24/2019 PCP: Georgann Housekeeper, MD  HPI/Recap of past 33 hours: 54 year old lady prior history of hypertension, hyperlipidemia, anxiety, depression, seizure disorder, COPD presents on 9/2 with worsening abdominal pain.  CT of the abdomen pelvis showed several abscesses within the abdomen along with colovaginal fistula loss.  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  GI and general surgery were consulted.  Patient underwent flex sigmoidoscopy on 10/30/2019 showing diverticulosis in the rectosigmoid and sigmoid colon along with stenosis and narrowing.  GI recommended outpatient follow-up with her colonoscopy.  Repeat CT of the abdomen pelvis on 9/9 showed central loculated abscess within the pelvis increased in size and not amenable to percutaneous drainage given its location.  She underwent diagnostic laparotomy with laparoscopic diverting loop ileostomy on 11/04/2019.  MRI of the pelvis on 11/05/2019 demonstrated abscess/fistula cavity in the central pelvis containing air and fluid interspersed between the mid sigmoid colon and rectum measuring 3.7 into 3.1 cm suspicious for abscess.   General surgery, on board and following the patient. On 9/22 CT abd and pelvis  Shows small bowel obstruction, with transition point  identified within the right lower quadrant of the abdomen in the area of the inflamed appendix. She is made NPO and Ng tube placed. TPN started for nutrition .  She continues to have brown fluid aspirated from the NG tube. Abdominal x-ray on 9/27 recommends advancing the NG tube 3 cm for better output.  Unfortunately the obstruction is proximal to her ileostomy. She will probably need repeat imaging with a CT vs laparotomy .   Patient seen and examined, teary appears frustrated that her obstruction is not getting better.   Requested to increase her IV Ativan.  11/20/19:  Reports discomfort from NG tube.  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  Not amenable to IR drainage given its location. S/p laparotomy with laparoscopic diverting loop ileostomy. Patient has been on IV Zosyn from 10/24/2019 till 11/13/2019, general surgery wants to continue the IV antibiotics for persistent abscess. Patient continues to have abdominal pain and distention and abdominal x-rays are unremarkable was followed with a CT of the abdomen and pelvis on 11/13/19 showing small bowel obstruction with transition point in the right lower quadrant at the site of the appendix.  NG tube was placed , connected to intermittent suction.  TPN started for nutrition .  She continues to have brown fluid aspirated from the NG tube. Abdominal x-ray on 9/27 recommends advancing the NG tube 3 cm for better output.  Unfortunately the obstruction is proximal to her ileostomy. She will probably need repeat imaging with a CT abd and pelvis vs laparotomy She remains afebrile and WBC count within normal limits Possible laparotomy this Thursday or Friday pending scheduling and availability, per General surgery. Repeat labs in the morning  Small bowel obstruction Management as per above Goal potassium greater than 4.0, goal magnesium greater than 2.0.  Anxiety and depression. Increased ativan to 1 mg every 4 hours prn.   COPD:  Stable No wheezing heard.   Continue with bronchodilators as needed  Hypokalemia, hypomagnesemia  Replaced Repeat labs in the morning  Hyperphosphatemia:  Replaced  Seizures:  None this  admission.  Not on AEDs  GERD On PPI IV daily.   Anemia of chronic disease/Macrocytic anemia;  Anemia panel reviewed.  Hemoglobin stable around 9. Hemoglobin uptrending No overt bleeding.  Essential hypertension BP stable On IV Lopressor 5 mg 3 times daily. Continue to monitor vital signs    DVT prophylaxis: lovenox SQ daily.  Code Status: full code.  Family Communication: (none at bedside)    Consultants:   General surgery  Procedures:  9/8-Flex sigmoidoscopy revealed diverticulosis in the rectosigmoid and sigmoid colon along with the stenosis and narrowing. Biopsies from colon and rectum negative for inflammation or malignancy 9/13-diagnostic laparotomy with laparoscopic diverting loop ileostomy      Status is: Inpatient    Dispo: The patient is from: Home              Anticipated d/c is KG:URKY with home health services.               Anticipated d/c date is: 11/25/2019.              Patient currently no stable for discharge due to management of intra-abdominal abscesses, SBO, severe protein calorie malnutrition, on TPN.       Objective: Vitals:   11/19/19 2139 11/20/19 0539 11/20/19 1355 11/20/19 1717  BP: 123/87 105/70 138/89 (!) 135/92  Pulse: 93 91 97 97  Resp: 16 16  18   Temp: 98.3 F (36.8 C)  98.4 F (36.9 C) 98.5 F (36.9 C)  TempSrc: Oral   Oral  SpO2: 98% 97% 99% 98%  Weight:      Height:        Intake/Output Summary (Last 24 hours) at 11/20/2019 1751 Last data filed at 11/20/2019 1510 Gross per 24 hour  Intake 2335.3 ml  Output 1200 ml  Net 1135.3 ml   Filed Weights   10/24/19 0751 11/19/19 1022  Weight: 78.5 kg 83.2 kg    Exam:  . 54 y.o. year-old female well developed well nourished in no acute distress.  Alert and oriented x3.  NG tube in place. . Cardiovascular: Regular rate and rhythm with no rubs or gallops.  No thyromegaly or JVD noted.   59 Respiratory: Clear to auscultation with no wheezes or rales.  Good inspiratory effort. . Abdomen: Soft nontender nondistended with normal bowel sounds x4 quadrants.  Ileostomy in place with minimal output. . Musculoskeletal: No lower extremity edema. 2/4 pulses in all 4 extremities. Marland Kitchen Psychiatry: Mood is appropriate for condition and setting   Data Reviewed: CBC: Recent Labs  Lab 11/15/19 0345 11/18/19 0326 11/20/19 1316  WBC 5.9 5.6 8.6  NEUTROABS 3.7 3.4 6.3  HGB 9.3* 9.3* 9.6*  HCT 29.6* 30.6* 30.9*  MCV 99.7 99.0 95.1  PLT 469* 367 350   Basic Metabolic Panel: Recent Labs  Lab 11/14/19 0954 11/14/19 0954 11/15/19 0345 11/16/19 0645 11/17/19 0622 11/18/19 0326 11/20/19 1316  NA 138   < > 139 138 139 138 136  K 3.8   < > 3.4* 4.0 4.0 4.0 4.1  CL 103   < > 106 105 105 104 102  CO2 26   < > 24 23 23 24 22   GLUCOSE 104*   < > 113* 119* 107* 107* 131*  BUN <5*   < > <5* 8 13 17 19   CREATININE 0.74   < > 0.62 0.48 0.49 0.60 0.57  CALCIUM 9.2   < >  8.9 9.4 9.5 9.6 9.8  MG 1.7  --  1.7 1.7 1.7 1.7  --   PHOS 4.3  --  4.0 3.9 4.5 4.5  --    < > = values in this interval not displayed.   GFR: Estimated Creatinine Clearance: 88.8 mL/min (by C-G formula based on SCr of 0.57 mg/dL). Liver Function Tests: Recent Labs  Lab 11/15/19 0345 11/16/19 0645 11/17/19 0622 11/18/19 0326 11/20/19 1316  AST 20 16 16 19  50*  ALT 12 12 11 13  38  ALKPHOS 90 88 92 103 146*  BILITOT 0.4 0.2* 0.3 0.5 0.8  PROT 6.0* 6.3* 6.5 6.9 7.0  ALBUMIN 2.8* 3.0* 3.0* 3.1* 3.2*   No results for input(s): LIPASE, AMYLASE in the last 168 hours. No results for input(s): AMMONIA in the last 168 hours. Coagulation Profile: No results for input(s): INR, PROTIME in the last 168 hours. Cardiac Enzymes: No results for input(s): CKTOTAL, CKMB, CKMBINDEX, TROPONINI in the last 168 hours. BNP (last 3 results) No results for input(s): PROBNP in the last 8760 hours. HbA1C: No results for input(s): HGBA1C in the last 72 hours. CBG: Recent Labs  Lab 11/16/19 1207  11/16/19 1801 11/16/19 2322 11/17/19 0558 11/17/19 1202  GLUCAP 113* 119* 138* 104* 111*   Lipid Profile: Recent Labs    11/18/19 0326  TRIG 163*   Thyroid Function Tests: No results for input(s): TSH, T4TOTAL, FREET4, T3FREE, THYROIDAB in the last 72 hours. Anemia Panel: No results for input(s): VITAMINB12, FOLATE, FERRITIN, TIBC, IRON, RETICCTPCT in the last 72 hours. Urine analysis:    Component Value Date/Time   COLORURINE YELLOW 10/24/2019 1822   APPEARANCEUR TURBID (A) 10/24/2019 1822   LABSPEC 1.013 10/24/2019 1822   PHURINE 5.0 10/24/2019 1822   GLUCOSEU NEGATIVE 10/24/2019 1822   HGBUR MODERATE (A) 10/24/2019 1822   BILIRUBINUR NEGATIVE 10/24/2019 1822   KETONESUR 5 (A) 10/24/2019 1822   PROTEINUR 100 (A) 10/24/2019 1822   UROBILINOGEN 0.2 08/23/2006 1144   NITRITE NEGATIVE 10/24/2019 1822   LEUKOCYTESUR LARGE (A) 10/24/2019 1822   Sepsis Labs: @LABRCNTIP (procalcitonin:4,lacticidven:4)  )No results found for this or any previous visit (from the past 240 hour(s)).    Studies: No results found.  Scheduled Meds: . Chlorhexidine Gluconate Cloth  6 each Topical Daily  . enoxaparin (LOVENOX) injection  40 mg Subcutaneous Q24H  . menthol-cetylpyridinium  1 lozenge Oral Q2H while awake  . metoprolol tartrate  5 mg Intravenous Q8H  . nicotine  7 mg Transdermal Daily  . pantoprazole (PROTONIX) IV  40 mg Intravenous Q24H  . psyllium  1 packet Oral Daily  . sodium chloride flush  10-40 mL Intracatheter Q12H    Continuous Infusions: . methocarbamol (ROBAXIN) IV Stopped (11/20/19 1451)  . piperacillin-tazobactam (ZOSYN)  IV 3.375 g (11/20/19 1529)  . TPN ADULT (ION) 85 mL/hr at 11/20/19 1510  . TPN ADULT (ION)       LOS: 27 days     11/22/19, MD [facility] Pager [phone]  If 7PM-7AM, please contact night-coverage [email or web address] Password [facility] 11/20/2019, 5:51 PM

## 2019-11-21 ENCOUNTER — Inpatient Hospital Stay (HOSPITAL_COMMUNITY): Payer: Self-pay | Admitting: Certified Registered Nurse Anesthetist

## 2019-11-21 ENCOUNTER — Other Ambulatory Visit: Payer: Self-pay

## 2019-11-21 ENCOUNTER — Inpatient Hospital Stay (HOSPITAL_COMMUNITY): Payer: Self-pay

## 2019-11-21 ENCOUNTER — Encounter (HOSPITAL_COMMUNITY): Payer: Self-pay | Admitting: Internal Medicine

## 2019-11-21 ENCOUNTER — Encounter (HOSPITAL_COMMUNITY)
Admission: EM | Disposition: A | Discharge status: Home Health | Payer: Self-pay | Source: Home / Self Care | Attending: Internal Medicine

## 2019-11-21 HISTORY — PX: SIGMOIDOSCOPY: SHX6686

## 2019-11-21 HISTORY — PX: LAPAROTOMY: SHX154

## 2019-11-21 HISTORY — PX: CYSTOSCOPY WITH STENT PLACEMENT: SHX5790

## 2019-11-21 HISTORY — PX: LAPAROSCOPIC APPENDECTOMY: SHX408

## 2019-11-21 LAB — COMPREHENSIVE METABOLIC PANEL
ALT: 35 U/L (ref 0–44)
AST: 34 U/L (ref 15–41)
Albumin: 3.3 g/dL — ABNORMAL LOW (ref 3.5–5.0)
Alkaline Phosphatase: 137 U/L — ABNORMAL HIGH (ref 38–126)
Anion gap: 9 (ref 5–15)
BUN: 15 mg/dL (ref 6–20)
CO2: 23 mmol/L (ref 22–32)
Calcium: 9.6 mg/dL (ref 8.9–10.3)
Chloride: 103 mmol/L (ref 98–111)
Creatinine, Ser: 0.47 mg/dL (ref 0.44–1.00)
GFR calc Af Amer: >60 mL/min (ref >60)
GFR calc non Af Amer: >60 mL/min (ref >60)
Glucose, Bld: 124 mg/dL — ABNORMAL HIGH (ref 70–99)
Potassium: 3.7 mmol/L (ref 3.5–5.1)
Sodium: 135 mmol/L (ref 135–145)
Total Bilirubin: 0.9 mg/dL (ref 0.3–1.2)
Total Protein: 6.8 g/dL (ref 6.5–8.1)

## 2019-11-21 LAB — CBC
HCT: 32.7 % — ABNORMAL LOW (ref 36.0–46.0)
HCT: 31.4 % — ABNORMAL LOW (ref 36.0–46.0)
Hemoglobin: 10.5 g/dL — ABNORMAL LOW (ref 12.0–15.0)
Hemoglobin: 9.9 g/dL — ABNORMAL LOW (ref 12.0–15.0)
MCH: 30.6 pg (ref 26.0–34.0)
MCH: 30.7 pg (ref 26.0–34.0)
MCHC: 32.1 g/dL (ref 30.0–36.0)
MCHC: 31.5 g/dL (ref 30.0–36.0)
MCV: 95.3 fL (ref 80.0–100.0)
MCV: 97.5 fL (ref 80.0–100.0)
Platelets: 392 10*3/uL (ref 150–400)
Platelets: 297 10*3/uL (ref 150–400)
RBC: 3.43 MIL/uL — ABNORMAL LOW (ref 3.87–5.11)
RBC: 3.22 MIL/uL — ABNORMAL LOW (ref 3.87–5.11)
RDW: 14.6 % (ref 11.5–15.5)
RDW: 14.2 % (ref 11.5–15.5)
WBC: 6.8 10*3/uL (ref 4.0–10.5)
WBC: 6.3 10*3/uL (ref 4.0–10.5)
nRBC: 0 % (ref 0.0–0.2)
nRBC: 0 % (ref 0.0–0.2)

## 2019-11-21 LAB — CREATININE, SERUM
Creatinine, Ser: 0.52 mg/dL (ref 0.44–1.00)
GFR calc Af Amer: >60 mL/min (ref >60)
GFR calc non Af Amer: >60 mL/min (ref >60)

## 2019-11-21 LAB — PHOSPHORUS: Phosphorus: 3.9 mg/dL (ref 2.5–4.6)

## 2019-11-21 LAB — MAGNESIUM: Magnesium: 1.8 mg/dL (ref 1.7–2.4)

## 2019-11-21 LAB — TYPE AND SCREEN
ABO/RH(D): O POS
Antibody Screen: NEGATIVE

## 2019-11-21 SURGERY — LAPAROTOMY, EXPLORATORY
Anesthesia: General | Site: Ureter

## 2019-11-21 MED ORDER — NALOXONE HCL 0.4 MG/ML IJ SOLN: 0.4000 mg | INTRAMUSCULAR | Status: DC | PRN

## 2019-11-21 MED ORDER — MIDAZOLAM HCL 5 MG/5ML IJ SOLN
INTRAMUSCULAR | Status: DC | PRN
  Administered 2019-11-21: 2 mg via INTRAVENOUS

## 2019-11-21 MED ORDER — PROMETHAZINE HCL 25 MG/ML IJ SOLN: 6.2500 mg | INTRAMUSCULAR | Status: DC | PRN

## 2019-11-21 MED ORDER — DIPHENHYDRAMINE HCL 12.5 MG/5ML PO ELIX: 12.5000 mg | ORAL_SOLUTION | Freq: Four times a day (QID) | ORAL | Status: DC | PRN

## 2019-11-21 MED ORDER — DEXAMETHASONE SODIUM PHOSPHATE 10 MG/ML IJ SOLN
INTRAMUSCULAR | Status: DC | PRN
  Administered 2019-11-21: 10 mg via INTRAVENOUS

## 2019-11-21 MED ORDER — HYDROMORPHONE HCL 1 MG/ML IJ SOLN
INTRAMUSCULAR | Status: AC
  Administered 2019-11-21: 0.25 mg
  Filled 2019-11-21: qty 1

## 2019-11-21 MED ORDER — KCL IN DEXTROSE-NACL 20-5-0.45 MEQ/L-%-% IV SOLN
INTRAVENOUS | Status: DC
  Filled 2019-11-21: qty 1000

## 2019-11-21 MED ORDER — FENTANYL CITRATE (PF) 100 MCG/2ML IJ SOLN
INTRAMUSCULAR | Status: DC | PRN
Start: 2019-11-21 — End: 2019-11-21
  Administered 2019-11-21: 50 ug via INTRAVENOUS
  Administered 2019-11-21: 100 ug via INTRAVENOUS
  Administered 2019-11-21 (×2): 50 ug via INTRAVENOUS

## 2019-11-21 MED ORDER — TRAVASOL 10 % IV SOLN
INTRAVENOUS | Status: AC
  Filled 2019-11-21: qty 999.6

## 2019-11-21 MED ORDER — PROPOFOL 10 MG/ML IV BOLUS
INTRAVENOUS | Status: AC
  Filled 2019-11-21: qty 20

## 2019-11-21 MED ORDER — DIPHENHYDRAMINE HCL 12.5 MG/5ML PO ELIX
12.5000 mg | ORAL_SOLUTION | Freq: Four times a day (QID) | ORAL | Status: DC | PRN
  Filled 2019-11-21: qty 5

## 2019-11-21 MED ORDER — ONDANSETRON HCL 4 MG/2ML IJ SOLN: 4.0000 mg | Freq: Four times a day (QID) | INTRAMUSCULAR | Status: DC | PRN

## 2019-11-21 MED ORDER — MIDAZOLAM HCL 2 MG/2ML IJ SOLN
INTRAMUSCULAR | Status: AC
  Filled 2019-11-21: qty 2

## 2019-11-21 MED ORDER — SODIUM CHLORIDE 0.9 % IR SOLN
Status: DC | PRN
  Administered 2019-11-21: 3000 mL via INTRAVESICAL
  Administered 2019-11-21 (×2): 2000 mL

## 2019-11-21 MED ORDER — DIPHENHYDRAMINE HCL 50 MG/ML IJ SOLN: 12.5000 mg | Freq: Four times a day (QID) | INTRAMUSCULAR | Status: DC | PRN

## 2019-11-21 MED ORDER — ENOXAPARIN SODIUM 40 MG/0.4ML ~~LOC~~ SOLN
40.0000 mg | SUBCUTANEOUS | Status: DC
  Administered 2019-11-22 – 2019-11-27 (×6): 40 mg via SUBCUTANEOUS
  Filled 2019-11-21 (×6): qty 0.4

## 2019-11-21 MED ORDER — ACETAMINOPHEN 10 MG/ML IV SOLN
1000.0000 mg | Freq: Four times a day (QID) | INTRAVENOUS | Status: AC
  Administered 2019-11-22 (×3): 1000 mg via INTRAVENOUS
  Filled 2019-11-21 (×3): qty 100

## 2019-11-21 MED ORDER — SODIUM CHLORIDE 0.9% FLUSH: 9.0000 mL | INTRAVENOUS | Status: DC | PRN

## 2019-11-21 MED ORDER — SUCCINYLCHOLINE CHLORIDE 200 MG/10ML IV SOSY
PREFILLED_SYRINGE | INTRAVENOUS | Status: DC | PRN
  Administered 2019-11-21: 160 mg via INTRAVENOUS

## 2019-11-21 MED ORDER — ROCURONIUM BROMIDE 10 MG/ML (PF) SYRINGE
PREFILLED_SYRINGE | INTRAVENOUS | Status: DC | PRN
  Administered 2019-11-21: 20 mg via INTRAVENOUS
  Administered 2019-11-21: 50 mg via INTRAVENOUS

## 2019-11-21 MED ORDER — METOPROLOL TARTRATE 5 MG/5ML IV SOLN
5.0000 mg | Freq: Four times a day (QID) | INTRAVENOUS | Status: DC | PRN
  Filled 2019-11-21: qty 5

## 2019-11-21 MED ORDER — MORPHINE SULFATE 2 MG/ML IV SOLN: INTRAVENOUS | Status: DC

## 2019-11-21 MED ORDER — HYDROMORPHONE 1 MG/ML IV SOLN
INTRAVENOUS | Status: DC
  Administered 2019-11-21: 30 mg via INTRAVENOUS
  Administered 2019-11-21: 1.8 mL via INTRAVENOUS
  Administered 2019-11-22: 2.7 mg via INTRAVENOUS
  Administered 2019-11-22 (×2): 2.4 mg via INTRAVENOUS
  Administered 2019-11-22: 3 mg via INTRAVENOUS
  Administered 2019-11-22: 0.9 mL via INTRAVENOUS
  Administered 2019-11-23: 1.2 mg via INTRAVENOUS
  Administered 2019-11-23: 1.5 mg via INTRAVENOUS

## 2019-11-21 MED ORDER — DEXAMETHASONE SODIUM PHOSPHATE 10 MG/ML IJ SOLN
INTRAMUSCULAR | Status: AC
  Filled 2019-11-21: qty 1

## 2019-11-21 MED ORDER — POTASSIUM CHLORIDE 10 MEQ/100ML IV SOLN
10.0000 meq | INTRAVENOUS | Status: AC
  Administered 2019-11-21: 10 meq via INTRAVENOUS
  Filled 2019-11-21: qty 100

## 2019-11-21 MED ORDER — ONDANSETRON HCL 4 MG/2ML IJ SOLN
INTRAMUSCULAR | Status: AC
  Filled 2019-11-21: qty 2

## 2019-11-21 MED ORDER — PROPOFOL 10 MG/ML IV BOLUS
INTRAVENOUS | Status: DC | PRN
  Administered 2019-11-21: 200 mg via INTRAVENOUS

## 2019-11-21 MED ORDER — FENTANYL CITRATE (PF) 250 MCG/5ML IJ SOLN
INTRAMUSCULAR | Status: AC
  Filled 2019-11-21: qty 5

## 2019-11-21 MED ORDER — LIDOCAINE 2% (20 MG/ML) 5 ML SYRINGE
INTRAMUSCULAR | Status: DC | PRN
  Administered 2019-11-21: 80 mg via INTRAVENOUS

## 2019-11-21 MED ORDER — MAGNESIUM SULFATE 2 GM/50ML IV SOLN
2.0000 g | Freq: Once | INTRAVENOUS | Status: AC
  Administered 2019-11-21: 2 g via INTRAVENOUS
  Filled 2019-11-21: qty 50

## 2019-11-21 MED ORDER — STERILE WATER FOR IRRIGATION IR SOLN: Status: DC | PRN

## 2019-11-21 MED ORDER — ONDANSETRON HCL 4 MG/2ML IJ SOLN
INTRAMUSCULAR | Status: DC | PRN
  Administered 2019-11-21: 4 mg via INTRAVENOUS

## 2019-11-21 MED ORDER — SUGAMMADEX SODIUM 200 MG/2ML IV SOLN
INTRAVENOUS | Status: DC | PRN
  Administered 2019-11-21: 200 mg via INTRAVENOUS

## 2019-11-21 MED ORDER — LACTATED RINGERS IV SOLN: INTRAVENOUS | Status: DC | PRN

## 2019-11-21 MED ORDER — POLYVINYL ALCOHOL 1.4 % OP SOLN
1.0000 [drp] | OPHTHALMIC | Status: DC | PRN
  Administered 2019-11-22 (×2): 1 [drp] via OPHTHALMIC
  Filled 2019-11-21 (×2): qty 15

## 2019-11-21 MED ORDER — SUCCINYLCHOLINE CHLORIDE 200 MG/10ML IV SOSY
PREFILLED_SYRINGE | INTRAVENOUS | Status: AC
  Filled 2019-11-21: qty 10

## 2019-11-21 MED ORDER — POTASSIUM CHLORIDE 10 MEQ/100ML IV SOLN
10.0000 meq | INTRAVENOUS | Status: AC
  Administered 2019-11-21 (×2): 10 meq via INTRAVENOUS
  Filled 2019-11-21 (×2): qty 100

## 2019-11-21 MED ORDER — ROCURONIUM BROMIDE 10 MG/ML (PF) SYRINGE
PREFILLED_SYRINGE | INTRAVENOUS | Status: AC
  Filled 2019-11-21: qty 10

## 2019-11-21 MED ORDER — PIPERACILLIN-TAZOBACTAM 3.375 G IVPB
INTRAVENOUS | Status: AC
  Filled 2019-11-21: qty 50

## 2019-11-21 MED ORDER — LIDOCAINE 2% (20 MG/ML) 5 ML SYRINGE
INTRAMUSCULAR | Status: AC
  Filled 2019-11-21: qty 5

## 2019-11-21 MED ORDER — HYDROMORPHONE HCL 1 MG/ML IJ SOLN
0.2500 mg | INTRAMUSCULAR | Status: DC | PRN
  Administered 2019-11-21: 0.25 mg via INTRAVENOUS
  Administered 2019-11-21: 0.5 mg via INTRAVENOUS

## 2019-11-21 MED ORDER — BUPIVACAINE LIPOSOME 1.3 % IJ SUSP
20.0000 mL | Freq: Once | INTRAMUSCULAR | Status: DC
  Filled 2019-11-21: qty 20

## 2019-11-21 SURGICAL SUPPLY — 73 items
APPLICATOR COTTON TIP 6 STRL (MISCELLANEOUS) ×4 IMPLANT
APPLICATOR COTTON TIP 6IN STRL (MISCELLANEOUS) ×5 IMPLANT
BAG DRAINAGE 600ML DEPOT (BAG) ×1 IMPLANT
BAG URO CATCHER STRL LF (MISCELLANEOUS) ×5 IMPLANT
BLADE EXTENDED COATED 6.5IN (ELECTRODE) ×1 IMPLANT
BLADE HEX COATED 2.75 (ELECTRODE) ×5 IMPLANT
CATH INTERMIT  6FR 70CM (CATHETERS) ×8 IMPLANT
CATH URET ROBINSON 24FR STRL (CATHETERS) ×1 IMPLANT
CLOTH BEACON ORANGE TIMEOUT ST (SAFETY) ×5 IMPLANT
COVER MAYO STAND STRL (DRAPES) ×7 IMPLANT
COVER SURGICAL LIGHT HANDLE (MISCELLANEOUS) ×6 IMPLANT
COVER WAND RF STERILE (DRAPES) IMPLANT
CUTTER FLEX LINEAR 45M (STAPLE) ×1 IMPLANT
DRAIN CHANNEL 19F RND (DRAIN) ×1 IMPLANT
DRAPE LAPAROSCOPIC ABDOMINAL (DRAPES) ×5 IMPLANT
DRAPE SHEET LG 3/4 BI-LAMINATE (DRAPES) ×3 IMPLANT
DRAPE UTILITY XL STRL (DRAPES) ×2 IMPLANT
DRAPE WARM FLUID 44X44 (DRAPES) ×5 IMPLANT
ELECT REM PT RETURN 15FT ADLT (MISCELLANEOUS) ×5 IMPLANT
EVACUATOR SILICONE 100CC (DRAIN) ×1 IMPLANT
GAUZE 4X4 16PLY RFD (DISPOSABLE) ×1 IMPLANT
GAUZE SPONGE 4X4 12PLY STRL (GAUZE/BANDAGES/DRESSINGS) ×5 IMPLANT
GLOVE BIOGEL M 8.0 STRL (GLOVE) ×5 IMPLANT
GLOVE BIOGEL M STRL SZ7.5 (GLOVE) ×5 IMPLANT
GOWN SPEC L4 XLG W/TWL (GOWN DISPOSABLE) ×5 IMPLANT
GOWN STRL REUS W/TWL LRG LVL3 (GOWN DISPOSABLE) ×10 IMPLANT
GOWN STRL REUS W/TWL XL LVL3 (GOWN DISPOSABLE) ×15 IMPLANT
GUIDEWIRE STR DUAL SENSOR (WIRE) ×5 IMPLANT
GUIDEWIRE ZIPWRE .038 STRAIGHT (WIRE) IMPLANT
HANDLE SUCTION POOLE (INSTRUMENTS) IMPLANT
KIT BASIN OR (CUSTOM PROCEDURE TRAY) ×5 IMPLANT
KIT SIGMOIDOSCOPE (SET/KITS/TRAYS/PACK) ×1 IMPLANT
KIT TURNOVER KIT A (KITS) IMPLANT
MANIFOLD NEPTUNE II (INSTRUMENTS) ×5 IMPLANT
NEEDLE HYPO 22GX1.5 SAFETY (NEEDLE) ×1 IMPLANT
NS IRRIG 1000ML POUR BTL (IV SOLUTION) ×10 IMPLANT
PACK CYSTO (CUSTOM PROCEDURE TRAY) ×5 IMPLANT
PACK GENERAL/GYN (CUSTOM PROCEDURE TRAY) ×5 IMPLANT
PENCIL SMOKE EVACUATOR (MISCELLANEOUS) IMPLANT
POUCH SPECIMEN RETRIEVAL 10MM (ENDOMECHANICALS) ×1 IMPLANT
RELOAD STAPLE 45 3.5 BLU ETS (ENDOMECHANICALS) IMPLANT
RELOAD STAPLE TA45 3.5 REG BLU (ENDOMECHANICALS) ×5 IMPLANT
SET IRRIG TUBING LAPAROSCOPIC (IRRIGATION / IRRIGATOR) ×1 IMPLANT
SET TUBE SMOKE EVAC HIGH FLOW (TUBING) ×1 IMPLANT
SHEARS HARMONIC ACE PLUS 45CM (MISCELLANEOUS) ×1 IMPLANT
SLEEVE ENDOPATH XCEL 5M (ENDOMECHANICALS) ×1 IMPLANT
SOL ANTI FOG 6CC (MISCELLANEOUS) IMPLANT
SOLUTION ANTI FOG 6CC (MISCELLANEOUS) ×1
SPONGE LAP 18X18 RF (DISPOSABLE) ×10 IMPLANT
STAPLER VISISTAT 35W (STAPLE) ×4 IMPLANT
SUCTION POOLE HANDLE (INSTRUMENTS) ×5
SUT ETHILON 2 0 PS N (SUTURE) ×1 IMPLANT
SUT PDS AB 1 CTX 36 (SUTURE) IMPLANT
SUT SILK 2 0 (SUTURE)
SUT SILK 2 0 SH CR/8 (SUTURE) ×4 IMPLANT
SUT SILK 2-0 18XBRD TIE 12 (SUTURE) ×4 IMPLANT
SUT SILK 3 0 (SUTURE) ×2
SUT SILK 3 0 SH CR/8 (SUTURE) ×4 IMPLANT
SUT SILK 3-0 18XBRD TIE 12 (SUTURE) ×4 IMPLANT
SUT VIC AB 3-0 SH 18 (SUTURE) IMPLANT
SUT VICRYL 2 0 18  UND BR (SUTURE)
SUT VICRYL 2 0 18 UND BR (SUTURE) IMPLANT
SYR 20ML LL LF (SYRINGE) ×1 IMPLANT
TOWEL OR 17X26 10 PK STRL BLUE (TOWEL DISPOSABLE) ×5 IMPLANT
TOWEL OR NON WOVEN STRL DISP B (DISPOSABLE) ×5 IMPLANT
TRAY FOLEY MTR SLVR 14FR STAT (SET/KITS/TRAYS/PACK) ×1 IMPLANT
TRAY FOLEY MTR SLVR 16FR STAT (SET/KITS/TRAYS/PACK) ×4 IMPLANT
TROCAR BLADELESS OPT 5 100 (ENDOMECHANICALS) ×2 IMPLANT
TROCAR XCEL 12X100 BLDLESS (ENDOMECHANICALS) ×1 IMPLANT
TROCAR XCEL BLUNT TIP 100MML (ENDOMECHANICALS) IMPLANT
TUBING CONNECTING 10 (TUBING) ×5 IMPLANT
TUBING INSUFFLATION 10FT LAP (TUBING) ×1 IMPLANT
TUBING UROLOGY SET (TUBING) ×1 IMPLANT

## 2019-11-21 NOTE — Op Note (Signed)
Operative Note  Preoperative diagnosis:  1.  Diverticulitis  Postoperative diagnosis: 1.  Diverticulitis  Procedure(s): 1.  Cystoscopy, bilateral open ended catheter placement  Surgeon: Jettie Pagan, MD  Assistants:  None  Anesthesia:  General  Complications:  None  EBL:  minimal  Specimens: 1. None for my portion  Drains/Catheters: 1.  Bilateral 5Fr open ended ureteral catheters 2. 16 Fr foley catheter  Intraoperative findings:   1. No lesions within bladder. Successful b/l open ended ureteral catheter placement.  Indication:  Susan Todd is a 54 y.o. female with acute diverticulitis status post laparoscopic diverting loop ileostomy 11/04/2019 now with small bowel obstruction.  She presents today for possible bowel resection possible ostomy placement.  Urology been requested placed ureteral stents prior.  After discussing all relevant risk for this alternatives, she consents to the procedure.  Description of procedure: The patient was identified and surgical site verification was performed prior to obtaining consent.  The patient was brought to the operating suite.  Under adequate general anesthesia the patient was positioned in dorsal lithotomy and prepped and draped.  A preoperative Time Out was performed addressing the anticipated surgical site, procedure, and safety precautions.  The 21Fr cystoscope was inserted into the patient's urethral meatus and advanced to the bladder.   The bladder was inspected with the 30 degree lens.  The ureteral orifices were in their normal anatomic positions.  The bladder showed no trabeculation.  There were no bladder tumors noted.  The right orifice was intubated with a sensor wire and a 5 [place] OEC was placed over the wire into the right ureter and advanced to 25 cm.  The wire was then removed and the same procedure was repeated on the left.  A 5 French OEC was advanced over the sensor wire to 25 cm into the left ureter. The OECs were left  in place and the cystoscope was removed.  A 16 F foley catheter was then inserted and the OECs were brought out and attached to individual drainage bag.  The OECs were secured to the foley catheter using silk ties and the foley/OECs were placed to gravity drainage.  The case was then turned over to Dr. Ermalene Searing team for the remainder of the procedure.  The OECs will be removed at the end of the case.  The foley catheter will be managed by the primary team.  Susan R. Arriel Victor MD [facility]  Pager: [phone]

## 2019-11-21 NOTE — H&P (View-Only) (Signed)
17 Days Post-Op  Subjective: Patient tearful this morning and scared of surgery that is scheduled for today.  Doesn't feel well as her NGT hurts.    ROS: See above, otherwise other systems negative  Objective: Vital signs in last 24 hours: Temp:  [98.4 F (36.9 C)-99.1 F (37.3 C)] 99.1 F (37.3 C) (09/30 0539) Pulse Rate:  [82-97] 82 (09/30 0539) Resp:  [18-20] 18 (09/30 0539) BP: (135-143)/(89-92) 135/92 (09/30 0539) SpO2:  [97 %-99 %] 98 % (09/30 0539) Last BM Date:  (ostomy and ng tube for sbo )  Intake/Output from previous day: 09/29 0701 - 09/30 0700 In: 956.3 [I.V.:747.5; NG/GT:60; IV Piggyback:148.8] Out: 765 [Emesis/NG output:515; Stool:250] Intake/Output this shift: No intake/output data recorded.  PE: Heart: regular Lungs: CTAB Abd: soft, ileostomy with minimal bilious output, stoma is pink and viable, incisions c/d/i  Lab Results:  Recent Labs    11/20/19 1316 11/21/19 0853  WBC 8.6 6.8  HGB 9.6* 10.5*  HCT 30.9* 32.7*  PLT 350 392   BMET Recent Labs    11/20/19 1316 11/21/19 0300  NA 136 135  K 4.1 3.7  CL 102 103  CO2 22 23  GLUCOSE 131* 124*  BUN 19 15  CREATININE 0.57 0.47  CALCIUM 9.8 9.6   PT/INR No results for input(s): LABPROT, INR in the last 72 hours. CMP     Component Value Date/Time   NA 135 11/21/2019 0300   K 3.7 11/21/2019 0300   CL 103 11/21/2019 0300   CO2 23 11/21/2019 0300   GLUCOSE 124 (H) 11/21/2019 0300   BUN 15 11/21/2019 0300   CREATININE 0.47 11/21/2019 0300   CREATININE 0.68 10/14/2010 1059   CALCIUM 9.6 11/21/2019 0300   PROT 6.8 11/21/2019 0300   ALBUMIN 3.3 (L) 11/21/2019 0300   AST 34 11/21/2019 0300   ALT 35 11/21/2019 0300   ALKPHOS 137 (H) 11/21/2019 0300   BILITOT 0.9 11/21/2019 0300   GFRNONAA >60 11/21/2019 0300   GFRAA >60 11/21/2019 0300   Lipase     Component Value Date/Time   LIPASE 18 09/19/2019 1459       Studies/Results: DG Abd 1 View  Result Date: 11/21/2019 CLINICAL  DATA:  Repositioning NG tube EXAM: ABDOMEN - 1 VIEW COMPARISON:  11/20/2019 FINDINGS: The enteric tube has withdrawn with tip now projected over the lower esophagus and proximal side hole in the mid to lower esophagus. Gas distended small bowel suggesting small bowel obstruction. Lung bases are clear. IMPRESSION: Enteric tube tip now projects over the lower esophagus. Advancement is suggested. Gas distended small bowel suggesting small bowel obstruction. Electronically Signed   By: Burman Nieves M.D.   On: 11/21/2019 06:58   DG Abd Portable 1V  Result Date: 11/20/2019 CLINICAL DATA:  NG tube placement EXAM: PORTABLE ABDOMEN - 1 VIEW COMPARISON:  11/18/2019, CT 11/13/2019 FINDINGS: Partially visualized central venous catheter tip at the SVC. Esophageal tube tip and side port overlie the gastric fundus. Apparent interval worsening of small bowel distension. IMPRESSION: Esophageal tube tip overlies the gastric fundus. Apparent interval worsening of small bowel distension. Electronically Signed   By: Jasmine Pang M.D.   On: 11/20/2019 19:28    Anti-infectives: Anti-infectives (From admission, onward)   Start     Dose/Rate Route Frequency Ordered Stop   11/14/19 1600  piperacillin-tazobactam (ZOSYN) IVPB 3.375 g        3.375 g 12.5 mL/hr over 240 Minutes Intravenous Every 8 hours 11/14/19 1514  11/03/19 0845  cefoTEtan (CEFOTAN) 2 g in sodium chloride 0.9 % 100 mL IVPB  Status:  Discontinued        2 g 200 mL/hr over 30 Minutes Intravenous On call to O.R. 11/03/19 0831 11/03/19 0842   10/25/19 1000  vancomycin (VANCOCIN) IVPB 1000 mg/200 mL premix  Status:  Discontinued        1,000 mg 200 mL/hr over 60 Minutes Intravenous Every 12 hours 10/24/19 1835 10/28/19 1727   10/24/19 2300  piperacillin-tazobactam (ZOSYN) IVPB 3.375 g  Status:  Discontinued        3.375 g 12.5 mL/hr over 240 Minutes Intravenous Every 8 hours 10/24/19 1835 11/13/19 1557   10/24/19 2000  vancomycin (VANCOREADY) IVPB  1500 mg/300 mL  Status:  Discontinued        1,500 mg 150 mL/hr over 120 Minutes Intravenous  Once 10/24/19 1835 10/24/19 1836   10/24/19 2000  vancomycin (VANCOREADY) IVPB 1500 mg/300 mL        1,500 mg 150 mL/hr over 120 Minutes Intravenous  Once 10/24/19 1836 10/24/19 2229   10/24/19 1730  piperacillin-tazobactam (ZOSYN) IVPB 3.375 g        3.375 g 100 mL/hr over 30 Minutes Intravenous  Once 10/24/19 1721 10/24/19 1810       Assessment/Plan COPD History of seizures Anxiety/depression Anemia-Hgb stable Hypokalemia-resolved   POD17, s/pLAPAROSCOPIC diverting loop ileostomy- 11/04/2019 - [place] complicated diverticulitis, possible malignancy, colovaginal fistula -undrained abscess in abdomen is improving on her follow up CT scan yesterday. -unfortunately,patient is not improving in regards to her partial obstruction. This area is proximal to her ileostomy which is not allowing for her to be able to eat etc.  -we will plan to go to surgery today with the assistance of urology for B stent placement prior to surgery.  This has all been discussed with the patient as well as her son, Jack Hartline, on the phone whom she asked me to call.  I explained his mom's situation to him as well as our plans today moving forward and why this is necessary.  He understood and all questions were answered.   -I discussed the surgery with the patient as well today including risks and complications such as bleeding, infection, need for post operative drain placement, prolonged post operative ileus, open wound, wound infection, incisions hernia, unintended injury to something not intended, etc.  She understands and is agreeable to proceed. -she asked us to call her brother, Mike Turner, after surgery.  His number is [phone]  FEN -NPO/NGT/IVFs/TNA VTE -Lovenox ID -zosyn 9/2 -->continue for now, hopefully post op with control of everything, this can be DC soon   LOS: 28 days    Allin Frix  E Janashia Parco , PA-C [facility] 11/21/2019, 9:48 AM Please see Amion for pager number during day hours 7:00am-4:30pm or 7:00am -11:30am on weekends  

## 2019-11-21 NOTE — Progress Notes (Signed)
17 Days Post-Op  Subjective: Patient tearful this morning and scared of surgery that is scheduled for today.  Doesn't feel well as her NGT hurts.    ROS: See above, otherwise other systems negative  Objective: Vital signs in last 24 hours: Temp:  [98.4 F (36.9 C)-99.1 F (37.3 C)] 99.1 F (37.3 C) (09/30 0539) Pulse Rate:  [82-97] 82 (09/30 0539) Resp:  [18-20] 18 (09/30 0539) BP: (135-143)/(89-92) 135/92 (09/30 0539) SpO2:  [97 %-99 %] 98 % (09/30 0539) Last BM Date:  (ostomy and ng tube for sbo )  Intake/Output from previous day: 09/29 0701 - 09/30 0700 In: 956.3 [I.V.:747.5; NG/GT:60; IV Piggyback:148.8] Out: 765 [Emesis/NG output:515; Stool:250] Intake/Output this shift: No intake/output data recorded.  PE: Heart: regular Lungs: CTAB Abd: soft, ileostomy with minimal bilious output, stoma is pink and viable, incisions c/d/i  Lab Results:  Recent Labs    11/20/19 1316 11/21/19 0853  WBC 8.6 6.8  HGB 9.6* 10.5*  HCT 30.9* 32.7*  PLT 350 392   BMET Recent Labs    11/20/19 1316 11/21/19 0300  NA 136 135  K 4.1 3.7  CL 102 103  CO2 22 23  GLUCOSE 131* 124*  BUN 19 15  CREATININE 0.57 0.47  CALCIUM 9.8 9.6   PT/INR No results for input(s): LABPROT, INR in the last 72 hours. CMP     Component Value Date/Time   NA 135 11/21/2019 0300   K 3.7 11/21/2019 0300   CL 103 11/21/2019 0300   CO2 23 11/21/2019 0300   GLUCOSE 124 (H) 11/21/2019 0300   BUN 15 11/21/2019 0300   CREATININE 0.47 11/21/2019 0300   CREATININE 0.68 10/14/2010 1059   CALCIUM 9.6 11/21/2019 0300   PROT 6.8 11/21/2019 0300   ALBUMIN 3.3 (L) 11/21/2019 0300   AST 34 11/21/2019 0300   ALT 35 11/21/2019 0300   ALKPHOS 137 (H) 11/21/2019 0300   BILITOT 0.9 11/21/2019 0300   GFRNONAA >60 11/21/2019 0300   GFRAA >60 11/21/2019 0300   Lipase     Component Value Date/Time   LIPASE 18 09/19/2019 1459       Studies/Results: DG Abd 1 View  Result Date: 11/21/2019 CLINICAL  DATA:  Repositioning NG tube EXAM: ABDOMEN - 1 VIEW COMPARISON:  11/20/2019 FINDINGS: The enteric tube has withdrawn with tip now projected over the lower esophagus and proximal side hole in the mid to lower esophagus. Gas distended small bowel suggesting small bowel obstruction. Lung bases are clear. IMPRESSION: Enteric tube tip now projects over the lower esophagus. Advancement is suggested. Gas distended small bowel suggesting small bowel obstruction. Electronically Signed   By: Burman Nieves M.D.   On: 11/21/2019 06:58   DG Abd Portable 1V  Result Date: 11/20/2019 CLINICAL DATA:  NG tube placement EXAM: PORTABLE ABDOMEN - 1 VIEW COMPARISON:  11/18/2019, CT 11/13/2019 FINDINGS: Partially visualized central venous catheter tip at the SVC. Esophageal tube tip and side port overlie the gastric fundus. Apparent interval worsening of small bowel distension. IMPRESSION: Esophageal tube tip overlies the gastric fundus. Apparent interval worsening of small bowel distension. Electronically Signed   By: Jasmine Pang M.D.   On: 11/20/2019 19:28    Anti-infectives: Anti-infectives (From admission, onward)   Start     Dose/Rate Route Frequency Ordered Stop   11/14/19 1600  piperacillin-tazobactam (ZOSYN) IVPB 3.375 g        3.375 g 12.5 mL/hr over 240 Minutes Intravenous Every 8 hours 11/14/19 1514  11/03/19 0845  cefoTEtan (CEFOTAN) 2 g in sodium chloride 0.9 % 100 mL IVPB  Status:  Discontinued        2 g 200 mL/hr over 30 Minutes Intravenous On call to O.R. 11/03/19 0831 11/03/19 0842   10/25/19 1000  vancomycin (VANCOCIN) IVPB 1000 mg/200 mL premix  Status:  Discontinued        1,000 mg 200 mL/hr over 60 Minutes Intravenous Every 12 hours 10/24/19 1835 10/28/19 1727   10/24/19 2300  piperacillin-tazobactam (ZOSYN) IVPB 3.375 g  Status:  Discontinued        3.375 g 12.5 mL/hr over 240 Minutes Intravenous Every 8 hours 10/24/19 1835 11/13/19 1557   10/24/19 2000  vancomycin (VANCOREADY) IVPB  1500 mg/300 mL  Status:  Discontinued        1,500 mg 150 mL/hr over 120 Minutes Intravenous  Once 10/24/19 1835 10/24/19 1836   10/24/19 2000  vancomycin (VANCOREADY) IVPB 1500 mg/300 mL        1,500 mg 150 mL/hr over 120 Minutes Intravenous  Once 10/24/19 1836 10/24/19 2229   10/24/19 1730  piperacillin-tazobactam (ZOSYN) IVPB 3.375 g        3.375 g 100 mL/hr over 30 Minutes Intravenous  Once 10/24/19 1721 10/24/19 1810       Assessment/Plan COPD History of seizures Anxiety/depression Anemia-Hgb stable Hypokalemia-resolved   POD17, s/pLAPAROSCOPIC diverting loop ileostomy- 11/04/2019 - [place] complicated diverticulitis, possible malignancy, colovaginal fistula -undrained abscess in abdomen is improving on her follow up CT scan yesterday. -unfortunately,patient is not improving in regards to her partial obstruction. This area is proximal to her ileostomy which is not allowing for her to be able to eat etc.  -we will plan to go to surgery today with the assistance of urology for B stent placement prior to surgery.  This has all been discussed with the patient as well as her son, Kelyse Pask, on the phone whom she asked me to call.  I explained his mom's situation to him as well as our plans today moving forward and why this is necessary.  He understood and all questions were answered.   -I discussed the surgery with the patient as well today including risks and complications such as bleeding, infection, need for post operative drain placement, prolonged post operative ileus, open wound, wound infection, incisions hernia, unintended injury to something not intended, etc.  She understands and is agreeable to proceed. -she asked [place] to call her brother, [name], after surgery.  His number is [phone]  FEN -NPO/NGT/IVFs/TNA VTE -Lovenox ID -zosyn 9/2 -->continue for now, hopefully post op with control of everything, this can be DC soon   LOS: 28 days    Letha Cape , [facility] 11/21/2019, 9:48 AM Please see Amion for pager number during day hours 7:00am-4:30pm or 7:00am -11:30am on weekends

## 2019-11-21 NOTE — Progress Notes (Signed)
PHARMACY - TOTAL PARENTERAL NUTRITION CONSULT NOTE   Indication: Prolonged ileus, SBO  Patient Measurements: Height: 5\' 8"  (172.7 cm) Weight: 83.2 kg (183 lb 6.8 oz) IBW/kg (Calculated) : 63.9 TPN AdjBW (KG): 78.5 Body mass index is 27.89 kg/m.  Assessment: Patient is a 54 y.o F who was instructed by her gastroenterologist to come to the ED on 9/2 for treatment of intra-abdominal abscesses.  She underwent flex sigmoidoscopy on 9/8 with biopsies taken and noted diverticular disease.  She subsequently underwent lap diverting loop ileostomy on 9/13.  Abdominal CT on 9/22 showed SBO.  Pharmacy is consulted on 9/23 to start TPN.  Glucose / Insulin: CBGs were controlled, now d/c. Serum glucose is controlled.  Electrolytes: wnl except K 3.7 below goal >4 and Mag 1.8 below goal > 2 Renal: Scr, BUN wnl LFTs / TGs: LFTs wnl, TG 163 (9/27) Prealbumin / albumin: albumin 3.3; pre-albumin inc to 20 (9/27) Intake / Output; MIVF:  - NGT output down significantly to 500 mL/24 hrs (RN reports no output overnight despite tube patent and flushing).  Ileostomy output increased to 250 mL/24 hrs. - net I/O inaccurate, unmeasured UOP x7 - no mIVF (d/c 9/24) GI Imaging: - 9/1 abd CT: Enlarging cystic, fluid containing area in the RIGHT adnexa may represent worsening of abscess adjacent to or involving the ovary. 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 a centrally located abscess within the pelvis which has demonstrated interval increase in size. - 9/14 pelvis MRI: Suspicious of abscess or fistula related to prior tip appendicitis or alternately diverticulitis.  The presence of air and fluid in the vagina is again highly suspicious for vaginal fistula. -  9/22 abd CT: positive for small bowel obstruction.  Decrease in size of gas and fluid collection.  Similar appearance of inflammatory changes of the sigmoid colon with small fistula tract.  Surgeries / Procedures:  - 9/8 flex sigmoidoscopy: biopsies taken and noted diverticular disease - 9/13: lap diverting loop ileostomy  Central access: PICC placed 9/23 TPN start date: 11/14/2019  Nutritional Goals (per RD recommendation on 9/23): kCal: 1900-2100, Protein: 95-105, Fluid: 2L/day Goal TPN rate is 85 mL/hr (provides 100 g of protein and 2060 kcals per day)  Current Nutrition:  - currently NPO w/ ice chips  Plan:  Now: - KCl 10 mEq IV x 3 runs - Magnesium 2g IV bolus At 18:00 - Continue TPN at goal rate of 85 ml/hr  - Electrolytes in TPN: 62mEq/L of Na, 38mEq/L of K, 51mEq/L of Ca, 46mEq/L of Mg, and 40mmol/L of Phos. Cl:Ac ratio 1:1 - Standard MVI and trace elements to TPN - d/c SSI/CBG checks due to glucose within goal, stable - Monitor TPN labs on Mon/Thurs, BMET/Mag on Friday - Follow up surgical plans  Tuesday PharmD, BCPS Clinical Pharmacist [facility] main pharmacy [phone] 11/21/2019 7:43 AM

## 2019-11-21 NOTE — Transfer of Care (Signed)
Immediate Anesthesia Transfer of Care Note  Patient: Susan Todd  Procedure(s) Performed: Procedure(s): EXPLORATORY LAPAROTOMY & enterolysis (N/A) CYSTOSCOPY WITH STENT PLACEMENT (Bilateral) SIGMOIDOSCOPY APPENDECTOMY LAPAROSCOPIC  Patient Location: PACU  Anesthesia Type:General  Level of Consciousness: Alert, Awake, Oriented  Airway & Oxygen Therapy: Patient Spontanous Breathing  Post-op Assessment: Report given to RN  Post vital signs: Reviewed and stable  Last Vitals:  Vitals:   11/21/19 0539 11/21/19 1117  BP: (!) 135/92 126/80  Pulse: 82 80  Resp: 18 16  Temp: 37.3 C 36.7 C  SpO2: 98% 100%    Complications: No apparent anesthesia complications

## 2019-11-21 NOTE — Consult Note (Signed)
WOC Nurse ostomy follow up Pouch is intact with good seal.  Pt plans to go to the OR today for exploration surgery. Supplies at bedside for staff nurse use.  Enrolled patient in [place] [employer] Discharge program: Yes, previously Cammie Mcgee MSN, RN, CWOCN, [place], CNS [phone]

## 2019-11-21 NOTE — Op Note (Signed)
Susan Todd  06-03-1965   11/21/2019    PCP:  Georgann Housekeeper, MD   Surgeon: Wenda Low, MD, FACS  Asst:  Angelena Form, MD, FACS  Anes:  general  Preop Dx: Pelvic abscess Postop Dx: same  Procedure: Laparoscopy, enterolysis and opening of pelvic abscess, sigmoidoscopy, appendectomy Location Surgery: [facility] Complications: None noted  EBL:   minimal cc  Drains: 19 blake drain in the pelvis  Description of Procedure:  The patient was taken to OR 1 .  After anesthesia was administered and the patient was prepped  with chloroprep  and a timeout was performed.  Access was achieved with a 5 mm Optiview without difficulty.  Following insufflation there were really no adhesions.  There was nothing ostensibly wrong with the loop ileostomy.  We eventually had three 5 mm trochars on the left and one 5 on the right.  The middle 1 on the left was upgraded to a 12 later in the case.  The case was performed entirely laparoscopically and the first thing we began looking at was the cecum and the appendix which was stuck down in the pelvis.  We followed some tissue planes and used blunt dissection to separate the right tube and ovary from this the appendix and we freed that.  We went along the right side of the sigmoid colon as it went down into the pelvis and mobilize that.  We lifted the uterus and as we went down in the pelvis I entered the abscess the cavity.  I sucked out some residual pus.  We then continued freeing this up and we could look down to the pelvic peritoneal reflection.  It was not clear the anatomy with the cervix and the vagina so Dr. Cliffton Asters went down and performed a bimanual examination and the soft cervix was found and there was no obvious entrance point into the vagina at this point.  So we mobilized everything that we could and then we clamped the sigmoid and I went down below and passed the rigid sigmoidoscope to 17 cm and at that point I could see beyond the area but there was  significant cicatrix affecting the mobility of the colon at that point and I was unable to pass the rigid sigmoidoscope any higher safely.  I however was able to insufflate it very well and above that area I could see the patent bowel.  Dr. Marvel Plan fill the pelvis with fluid and with insufflation there was no bubbles or no evidence of a leak.  We discussed various options and converting to open and sigmoid ectomy would require a mucous fistula and would commit her to a subsequent anastomosis that may be impossible with the degree of inflammation in the pelvis at a later date.  Since we did not see any evidence of malignancy and this all appeared to be inflammatory and there was no evidence of bubbles in a leak per rectum we elected to remove the indurated appendix and place a drain in the pelvis.  The appendix was mobilized with the aid of the harmonic scalpel and then the base was transected with a Ethicon 4.5 mm stapler with a blue load.  The appendix was placed in a bag and brought out through the 12 mm trocar.  The 19 Blake drain was brought in through the right side trocar site and placed down into the abscess cavity behind the uterus and anterior to the sigmoid.  No bleeding was seen at this point.  We then  passed the red Robinson catheter through both limbs of the loop ileostomy and then ran the small bowel proximally and saw no evidence of strictures or obstruction.  I asked that the NG tube be removed and the wounds were closed with Monocryl and Dermabond.  The patient tolerated the procedure well and was taken to the PACU in stable condition.     Matt B. Daphine Deutscher, MD, [facility], [place] [phone]

## 2019-11-21 NOTE — Anesthesia Procedure Notes (Signed)
Procedure Name: Intubation Date/Time: 11/21/2019 2:11 PM Performed by: Gerald Leitz, CRNA Pre-anesthesia Checklist: Patient identified, Patient being monitored, Timeout performed, Emergency Drugs available and Suction available Patient Re-evaluated:Patient Re-evaluated prior to induction Oxygen Delivery Method: Circle system utilized Preoxygenation: Pre-oxygenation with 100% oxygen Induction Type: IV induction and Rapid sequence Ventilation: Mask ventilation without difficulty Laryngoscope Size: Mac and 3 Grade View: Grade I Tube type: Oral Tube size: 7.0 mm Number of attempts: 1 Placement Confirmation: ETT inserted through vocal cords under direct vision,  positive ETCO2 and breath sounds checked- equal and bilateral Secured at: 21 cm Tube secured with: Tape Dental Injury: Teeth and Oropharynx as per pre-operative assessment

## 2019-11-21 NOTE — Progress Notes (Signed)
Nutrition Follow-up  INTERVENTION:   -TPN management per Pharmacy  NUTRITION DIAGNOSIS:   Increased nutrient needs related to  (intra-abdominal abscess) as evidenced by estimated needs.  Ongoing.  GOAL:   Patient will meet greater than or equal to 90% of their needs  Meeting with TPN.  MONITOR:   Labs, Weight trends, I & O's, Diet advancement (TPN)  ASSESSMENT:   54 year old lady prior history of hypertension, hyperlipidemia, anxiety, depression, seizure disorder, COPD presents on 9/2 with worsening abdominal pain.  CT of the abdomen pelvis showed several abscesses within the abdomen along with colovaginal fistula loss.  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  GI and general surgery were consulted.  Patient underwent flex sigmoidoscopy on 10/30/2019 showing diverticulosis in the rectosigmoid and sigmoid colon along with stenosis and narrowing.  GI recommended outpatient follow-up with her colonoscopy.  Repeat CT of the abdomen pelvis on 9/9 showed central loculated abscess within the pelvis increased in size and not amenable to percutaneous drainage given its location.  She underwent diagnostic laparotomy with laparoscopic diverting loop ileostomy on 11/04/2019.  MRI of the pelvis on 11/05/2019 demonstrated abscess/fistula cavity in the central pelvis containing air and fluid interspersed between the mid sigmoid colon and rectum measuring 3.7 into 3.1 cm suspicious for abscess.  9/2: admission 9/13: soft diet 9/19: CLD 9/22: regular ->NPO, NGT placed 9/23: TPN initiated  Patient to have exploratory surgery today. Will continue to monitor for plan of care.   Per Pharmacy, pt receiving TPN at 85 ml/hr (providing 2019 kcals and 99g protein).  Admission weight: 173 lbs. Current weight: 176 lbs.  I/Os: +7.9L since 9/16 NGT: 515 ml Colostomy: 250 ml  Labs reviewed.  Medications: IV Mg sulfate, KCl  Diet Order:   Diet Order            Diet NPO time specified Except for: Ice Chips, Other (See Comments)  Diet effective now                 EDUCATION NEEDS:   No education needs have been identified at this time  Skin:  Skin Assessment: Skin Integrity Issues: Skin Integrity Issues:: Incisions Incisions: 9/13 abdomen  Last BM:  9/23 -colostomy  Height:   Ht Readings from Last 1 Encounters:  10/24/19 5\' 8"  (1.727 m)    Weight:   Wt Readings from Last 1 Encounters:  11/19/19 83.2 kg    BMI:  Body mass index is 27.89 kg/m.  Estimated Nutritional Needs:   Kcal:  1900-2100  Protein:  95-105g  Fluid:  2L/day   11/21/19, MS, RD, LDN Inpatient Clinical Dietitian Contact information available via Amion

## 2019-11-21 NOTE — Anesthesia Preprocedure Evaluation (Addendum)
Anesthesia Evaluation  Patient identified by MRN, date of birth, ID band Patient awake    Reviewed: Allergy & Precautions, NPO status , Patient's Chart, lab work & pertinent test results  Airway Mallampati: II  TM Distance: >3 FB Neck ROM: Full    Dental  (+) Teeth Intact   Pulmonary COPD, Current Smoker and Patient abstained from smoking.,    Pulmonary exam normal breath sounds clear to auscultation       Cardiovascular Exercise Tolerance: Good hypertension, Pt. on medications and Pt. 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   Endo/Other  negative endocrine ROS  Renal/GU negative Renal ROS  negative genitourinary   Musculoskeletal  (+) Arthritis ,   Abdominal   Peds negative pediatric ROS (+)  Hematology  (+) Blood dyscrasia, anemia ,   Anesthesia Other Findings NGT in place (minimal output)  Reproductive/Obstetrics negative OB ROS                            Anesthesia Physical  Anesthesia Plan  ASA: III  Anesthesia Plan: General   Post-op Pain Management:    Induction: Intravenous  PONV Risk Score and Plan: 1 and Propofol infusion and Treatment may vary due to age or medical condition  Airway Management Planned: Oral ETT  Additional Equipment:   Intra-op Plan:   Post-operative Plan: Extubation in OR  Informed Consent: I have reviewed the patients History and Physical, chart, labs and discussed the procedure including the risks, benefits and alternatives for the proposed anesthesia with the patient or authorized representative who has indicated his/her understanding and acceptance.      Dental advisory given  Plan Discussed with: CRNA and Anesthesiologist  Anesthesia Plan Comments: (GETA. RSI. 2 PIV. )       Anesthesia Quick Evaluation

## 2019-11-21 NOTE — Progress Notes (Signed)
No NG output this shift. Imaging study does not recommend advancing tube. Tube is patent and flushes. On call MD paged to notify. No new orders.   Pt pulled NG tube out a few inches. RN advanced tube back in. Order in place for new imaging to verify placement.

## 2019-11-21 NOTE — Progress Notes (Signed)
PT Cancellation Note  Patient Details Name: Susan Todd MRN: [id number] DOB: August 16, 1965   Cancelled Treatment:    Reason Eval/Treat Not Completed: Medical issues which prohibited therapy, per notes, has upcoming surgery. Please reconsult post operatively if  PT indicated. Thank you.   Rada Hay 11/21/2019, 11:58 AM  Blanchard Kelch PT Acute Rehabilitation Services Pager [phone] Office [phone]

## 2019-11-21 NOTE — Consult Note (Signed)
Urology Consult   Physician requesting consult: Dr. Luretha Murphy  Reason for consult: cystoscopy, b/l open ended catheter placement  History of Present Illness: Susan Todd is a 54 y.o. 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  She underwent diverting loop ileostomy 11/04/2019. She has been brought back to the operating today by Dr. Daphine Deutscher for an exploratory laparotomy, possible colon resection possible colostomy.  Urology has been requested to place bilateral open-ended catheters prior to their portion of the case.  Patient reports she is doing well.  She has no history of urolithiasis.  She denies a history of voiding or storage urinary symptoms, hematuria, UTIs, STDs, urolithiasis, GU malignancy/trauma/surgery.  Past Medical History:  Diagnosis Date  . Alcoholism (HCC)   . Anxiety   . Arthritis   . Atrial fibrillation (HCC)    "pt. has no knowledge of this" as of 03-07-14  . Carotid artery occlusion   . COPD (chronic obstructive pulmonary disease) (HCC)    Emphysema  . Depression   . Diverticulosis   . Hypercholesterolemia   . Hypertension   . Seizure disorder (HCC)   . Vaginal candidiasis     Past Surgical History:  Procedure Laterality Date  . BIOPSY  10/30/2019   Procedure: BIOPSY;  Surgeon: Meridee Score Netty Starring., MD;  Location: [facility];  Service: Gastroenterology;;  . [place] Bilateral   . CYST EXCISION PERINEAL     8'03- Dr. Elana Alm  . FLEXIBLE SIGMOIDOSCOPY N/A 10/30/2019   Procedure: FLEXIBLE SIGMOIDOSCOPY;  Surgeon: Meridee Score Netty Starring., MD;  Location: [facility];  Service: Gastroenterology;  Laterality: N/A;  . LAPAROSCOPY N/A 11/04/2019   Procedure: DIAGNOSTIC LAPAROTOMY WITH LAPAROSCOPIC diverting loop ileostomy;  Surgeon: Andria Meuse, MD;  Location: [facility];  Service: General;  Laterality: N/A;     Current Hospital Medications:  Home meds:  No current facility-administered medications on file  prior to encounter.   Current Outpatient Medications on File Prior to Encounter  Medication Sig Dispense Refill  . acetaminophen (TYLENOL) 500 MG tablet Take 500 mg by mouth every 6 (six) hours as needed for mild pain or headache.     . albuterol (VENTOLIN HFA) 108 (90 Base) MCG/ACT inhaler Inhale 2 puffs into the lungs every 6 (six) hours as needed for wheezing or shortness of breath.     Marland Kitchen aspirin EC 81 MG tablet Take 81 mg by mouth daily.    Marland Kitchen atorvastatin (LIPITOR) 10 MG tablet Take 10 mg by mouth every evening.     . calcium-vitamin D (OSCAL WITH D) 500-200 MG-UNIT TABS tablet Take 1 tablet by mouth at bedtime.    . cetirizine (ZYRTEC) 5 MG tablet Take 5 mg by mouth at bedtime.    . gabapentin (NEURONTIN) 100 MG capsule Take 100 mg by mouth at bedtime as needed (pain).     . LORazepam (ATIVAN) 1 MG tablet Take 1 tablet (1 mg total) by mouth at bedtime. (Patient taking differently: Take 1 mg by mouth daily as needed for anxiety. ) 30 tablet 0  . losartan (COZAAR) 25 MG tablet Take 25 mg by mouth daily.    . metoprolol tartrate (LOPRESSOR) 25 MG tablet Take 25 mg by mouth 2 (two) times daily.    . Multiple Vitamin (MULTI-VITAMIN) tablet Take 1 tablet by mouth at bedtime.     Marland Kitchen HYDROcodone-acetaminophen (NORCO/VICODIN) 5-325 MG tablet Take 1 tablet by mouth every 6 (six) hours as needed for severe pain. 20 tablet 0  . ibuprofen (ADVIL) 800 MG  tablet Take 1 tablet (800 mg total) by mouth every 8 (eight) hours as needed. (Patient not taking: Reported on 10/24/2019) 30 tablet 0     Scheduled Meds: . bupivacaine liposome  20 mL Infiltration Once  . [MAR Hold] Chlorhexidine Gluconate Cloth  6 each Topical Daily  . [MAR Hold] enoxaparin (LOVENOX) injection  40 mg Subcutaneous Q24H  . [MAR Hold] menthol-cetylpyridinium  1 lozenge Oral Q2H while awake  . [MAR Hold] metoprolol tartrate  5 mg Intravenous Q8H  . [MAR Hold] nicotine  7 mg Transdermal Daily  . [MAR Hold] pantoprazole (PROTONIX) IV  40  mg Intravenous Q24H  . [MAR Hold] psyllium  1 packet Oral Daily  . [MAR Hold] sodium chloride flush  10-40 mL Intracatheter Q12H   Continuous Infusions: . [MAR Hold] methocarbamol (ROBAXIN) IV 1,000 mg (11/21/19 0175)  . piperacillin-tazobactam    . [MAR Hold] piperacillin-tazobactam (ZOSYN)  IV 3.375 g (11/21/19 1025)  . TPN ADULT (ION) 85 mL/hr at 11/20/19 1925  . TPN ADULT (ION)     PRN Meds:.[MAR Hold] albuterol, [MAR Hold] alum & mag hydroxide-simeth, [MAR Hold] hydrALAZINE, [MAR Hold] LORazepam, [MAR Hold]  morphine injection, [MAR Hold] ondansetron **OR** [MAR Hold] ondansetron (ZOFRAN) IV, [MAR Hold] oxyCODONE, [MAR Hold] phenol, [MAR Hold] promethazine, [MAR Hold] sodium chloride flush, [MAR Hold] sodium chloride flush  Allergies:  Allergies  Allergen Reactions  . Codeine Nausea And Vomiting  . Latex Hives  . Ciprofloxacin     Made pt dizzy  . Metronidazole Diarrhea    Patient is not able to take tablet form.    Family History  Problem Relation Age of Onset  . Heart disease Mother        NOT  before age 36  . Hyperlipidemia Mother   . Dementia Mother   . Hypertension Mother   . Depression Mother   . COPD Mother   . Other Mother        Covid 19-deceased from  . Hyperlipidemia Father   . Hypertension Father   . Colon polyps Father   . Melanoma Father   . Hyperlipidemia Brother   . Hypertension Brother   . Heart disease Brother        Arhymia  . Alcohol abuse Brother   . Depression Brother   . Melanoma Brother   . Heart disease Maternal Aunt   . Alcoholism Son     Social History:  reports that she has been smoking cigarettes. She has a 30.00 pack-year smoking history. She has never used smokeless tobacco. She reports previous alcohol use. She reports that she does not use drugs.  ROS: A complete review of systems was performed.  All systems are negative except for pertinent findings as noted.  Physical Exam:  Vital signs in last 24 hours: Temp:  [98.1 F  (36.7 C)-99.1 F (37.3 C)] 98.1 F (36.7 C) (09/30 1117) Pulse Rate:  [80-97] 80 (09/30 1117) Resp:  [16-20] 16 (09/30 1117) BP: (126-143)/(80-92) 126/80 (09/30 1117) SpO2:  [97 %-100 %] 100 % (09/30 1117) Weight:  [80.2 kg-81.6 kg] 81.6 kg (09/30 1303) Constitutional:  Alert and oriented, No acute distress Cardiovascular: Regular rate and rhythm Respiratory: Normal respiratory effort, Lungs clear bilaterally GI: Abdomen is soft, nontender, nondistended, no abdominal masses GU: No CVA tenderness Neurologic: Grossly intact, no focal deficits Psychiatric: Normal mood and affect  Laboratory Data:  Recent Labs    11/20/19 1316 11/21/19 0853  WBC 8.6 6.8  HGB 9.6* 10.5*  HCT 30.9* 32.7*  PLT 350 392    Recent Labs    11/20/19 1316 11/21/19 0300  NA 136 135  K 4.1 3.7  CL 102 103  GLUCOSE 131* 124*  BUN 19 15  CALCIUM 9.8 9.6  CREATININE 0.57 0.47     Results for orders placed or performed during the hospital encounter of 10/24/19 (from the past 24 hour(s))  Comprehensive metabolic panel     Status: Abnormal   Collection Time: 11/21/19  3:00 AM  Result Value Ref Range   Sodium 135 135 - 145 mmol/L   Potassium 3.7 3.5 - 5.1 mmol/L   Chloride 103 98 - 111 mmol/L   CO2 23 22 - 32 mmol/L   Glucose, Bld 124 (H) 70 - 99 mg/dL   BUN 15 6 - 20 mg/dL   Creatinine, Ser 1.610.47 0.44 - 1.00 mg/dL   Calcium 9.6 8.9 - 09.610.3 mg/dL   Total Protein 6.8 6.5 - 8.1 g/dL   Albumin 3.3 (L) 3.5 - 5.0 g/dL   AST 34 15 - 41 U/L   ALT 35 0 - 44 U/L   Alkaline Phosphatase 137 (H) 38 - 126 U/L   Total Bilirubin 0.9 0.3 - 1.2 mg/dL   GFR calc non Af Amer >60 >60 mL/min   GFR calc Af Amer >60 >60 mL/min   Anion gap 9 5 - 15  Magnesium     Status: None   Collection Time: 11/21/19  3:00 AM  Result Value Ref Range   Magnesium 1.8 1.7 - 2.4 mg/dL  Phosphorus     Status: None   Collection Time: 11/21/19  3:00 AM  Result Value Ref Range   Phosphorus 3.9 2.5 - 4.6 mg/dL  CBC     Status:  Abnormal   Collection Time: 11/21/19  8:53 AM  Result Value Ref Range   WBC 6.8 4.0 - 10.5 K/uL   RBC 3.43 (L) 3.87 - 5.11 MIL/uL   Hemoglobin 10.5 (L) 12.0 - 15.0 g/dL   HCT 04.532.7 (L) 36 - 46 %   MCV 95.3 80.0 - 100.0 fL   MCH 30.6 26.0 - 34.0 pg   MCHC 32.1 30.0 - 36.0 g/dL   RDW 40.914.6 81.111.5 - 91.415.5 %   Platelets 392 150 - 400 K/uL   nRBC 0.0 0.0 - 0.2 %  Type and screen  [facility]     Status: None   Collection Time: 11/21/19  8:53 AM  Result Value Ref Range   ABO/RH(D) O POS    Antibody Screen NEG    Sample Expiration      11/24/2019,2359 Performed at [facility], [address] [place], [place] [address]    No results found for this or any previous visit (from the past 240 hour(s)).  Renal Function: Recent Labs    11/15/19 0345 11/16/19 0645 11/17/19 0622 11/18/19 0326 11/20/19 1316 11/21/19 0300  CREATININE 0.62 0.48 0.49 0.60 0.57 0.47   Estimated Creatinine Clearance: 88 mL/min (by C-G formula based on SCr of 0.47 mg/dL).  Radiologic Imaging: DG Abd 1 View  Result Date: 11/21/2019 CLINICAL DATA:  Repositioning NG tube EXAM: ABDOMEN - 1 VIEW COMPARISON:  11/20/2019 FINDINGS: The enteric tube has withdrawn with tip now projected over the lower esophagus and proximal side hole in the mid to lower esophagus. Gas distended small bowel suggesting small bowel obstruction. Lung bases are clear. IMPRESSION: Enteric tube tip now projects over the lower esophagus. Advancement is suggested. Gas distended small bowel suggesting small  bowel obstruction. Electronically Signed   By: Burman Nieves M.D.   On: 11/21/2019 06:58   DG Abd Portable 1V  Result Date: 11/20/2019 CLINICAL DATA:  NG tube placement EXAM: PORTABLE ABDOMEN - 1 VIEW COMPARISON:  11/18/2019, CT 11/13/2019 FINDINGS: Partially visualized central venous catheter tip at the SVC. Esophageal tube tip and side port overlie the gastric fundus. Apparent interval worsening of small  bowel distension. IMPRESSION: Esophageal tube tip overlies the gastric fundus. Apparent interval worsening of small bowel distension. Electronically Signed   By: Jasmine Pang M.D.   On: 11/20/2019 19:28    I independently reviewed the above imaging studies.  Impression/Recommendation: 1. Complicating diverticulitis status post diverting loop ileostomy on 11/04/2019.  -We will place cystoscopy, bilateral open-ended catheters.  All risk benefits and alternative discussed with the patient.  She elects to proceed.  Jannifer Hick 11/21/2019, 1:34 PM  Matt R. Zyshawn Bohnenkamp MD [facility]  Pager: [phone]   CC: Dr. Luretha Murphy

## 2019-11-21 NOTE — Anesthesia Postprocedure Evaluation (Signed)
Anesthesia Post Note  Patient: Susan Todd  Procedure(s) Performed: EXPLORATORY LAPAROTOMY & enterolysis (N/A Abdomen) SIGMOIDOSCOPY (Rectum) APPENDECTOMY LAPAROSCOPIC (Abdomen) CYSTOSCOPY WITH STENT PLACEMENT (Bilateral Ureter)     Patient location during evaluation: PACU Anesthesia Type: General Level of consciousness: awake and alert Pain management: pain level controlled Vital Signs Assessment: post-procedure vital signs reviewed and stable Respiratory status: spontaneous breathing, nonlabored ventilation, respiratory function stable and patient connected to nasal cannula oxygen Cardiovascular status: blood pressure returned to baseline and stable Postop Assessment: no apparent nausea or vomiting Anesthetic complications: no   No complications documented.  Last Vitals:  Vitals:   11/21/19 1830 11/21/19 1848  BP: (!) 155/99 (!) 154/92  Pulse: 97 98  Resp: 17 16  Temp: (!) 36.3 C 36.4 C  SpO2: 100% 98%    Last Pain:  Vitals:   11/21/19 1848  TempSrc: Axillary  PainSc:                  Mellody Dance

## 2019-11-21 NOTE — Progress Notes (Signed)
PROGRESS NOTE  Susan Todd WSF:[id number] DOB: [birth year] DOA: 10/24/2019 PCP: Georgann Housekeeper, MD  HPI/Recap of past 70 hours: 54 year old lady prior history of hypertension, hyperlipidemia, anxiety, depression, seizure disorder, COPD presents on 9/2 with worsening abdominal pain.  CT of the abdomen pelvis showed several abscesses within the abdomen along with colovaginal fistula loss.  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  GI and general surgery were consulted.  Patient underwent flex sigmoidoscopy on 10/30/2019 showing diverticulosis in the rectosigmoid and sigmoid colon along with stenosis and narrowing.  GI recommended outpatient follow-up with her colonoscopy.  Repeat CT of the abdomen pelvis on 9/9 showed central loculated abscess within the pelvis increased in size and not amenable to percutaneous drainage given its location.  She underwent diagnostic laparotomy with laparoscopic diverting loop ileostomy on 11/04/2019.  MRI of the pelvis on 11/05/2019 demonstrated abscess/fistula cavity in the central pelvis containing air and fluid interspersed between the mid sigmoid colon and rectum measuring 3.7 into 3.1 cm suspicious for abscess.   General surgery, on board and following the patient. On 9/22 CT abd and pelvis  Shows small bowel obstruction, with transition point  identified within the right lower quadrant of the abdomen in the area of the inflamed appendix. She is made NPO and Ng tube placed. TPN started for nutrition .  She continues to have brown fluid aspirated from the NG tube. Abdominal x-ray on 9/27 recommends advancing the NG tube 3 cm for better output.  Unfortunately the obstruction is proximal to her ileostomy. She will probably need repeat imaging with a CT vs laparotomy .   Patient seen and examined, teary appears frustrated that her obstruction is not getting better.   Requested to increase her IV Ativan.  11/21/19: No new complaints.  Reports she has been ambulating.  Plan for laparotomy this afternoon.  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  Not amenable to IR drainage given its location. S/p laparotomy with laparoscopic diverting loop ileostomy. Patient has been on IV Zosyn from 10/24/2019 till 11/13/2019, general surgery wants to continue the IV antibiotics for persistent abscess. Patient continues to have abdominal pain and distention and abdominal x-rays are unremarkable was followed with a CT of the abdomen and pelvis on 11/13/19 showing small bowel obstruction with transition point in the right lower quadrant at the site of the appendix.  NG tube was placed , connected to intermittent suction.  TPN started for nutrition .  She continues to have brown fluid aspirated from the NG tube. Abdominal x-ray on 9/27 recommends advancing the NG tube 3 cm for better output.  Unfortunately the obstruction is proximal to her ileostomy. She will probably need repeat imaging with a CT abd and pelvis vs laparotomy She remains afebrile and WBC count within normal limits Laparotomy planned this afternoon.  We appreciate general surgery's assistance.  Small bowel obstruction Management as per above Goal potassium greater than 4.0, goal magnesium greater than 2.0. Continue to monitor electrolytes and replete as indicated.  Anxiety and depression. Increased ativan to 1 mg every 4 hours prn.   COPD:  Stable No wheezing heard.  Continue with bronchodilators as needed  Hypokalemia, hypomagnesemia  Replaced Repeat labs in the  morning  Hyperphosphatemia:  Replaced  Seizures:  None this admission.  Not on specific home AEDs  GERD On PPI IV daily.   Anemia of chronic disease/Macrocytic anemia;  Hemoglobin uptrending 10.5 from 9.6. No overt bleeding.  Essential hypertension BP is at goal. On IV Lopressor 5 mg 3 times daily. Continue to monitor vital signs    DVT prophylaxis: lovenox SQ daily.  Code Status: full code.  Family Communication: (none at bedside)    Consultants:   General surgery  Procedures:  9/8-Flex sigmoidoscopy revealed diverticulosis in the rectosigmoid and sigmoid colon along with the stenosis and narrowing. Biopsies from colon and rectum negative for inflammation or malignancy 9/13-diagnostic laparotomy with laparoscopic diverting loop ileostomy      Status is: Inpatient    Dispo: The patient is from: Home              Anticipated d/c is ZO:XWRUto:Home with home health services.               Anticipated d/c date is: 11/25/2019.              Patient currently no stable for discharge due to management of intra-abdominal abscesses, SBO, severe protein calorie malnutrition, on TPN.       Objective: Vitals:   11/21/19 0539 11/21/19 0726 11/21/19 1117 11/21/19 1303  BP: (!) 135/92  126/80   Pulse: 82  80   Resp: 18  16   Temp: 99.1 F (37.3 C)  98.1 F (36.7 C)   TempSrc: Oral  Oral   SpO2: 98%  100%   Weight:  80.2 kg  81.6 kg  Height:    5\' 8"  (1.727 m)    Intake/Output Summary (Last 24 hours) at 11/21/2019 1312 Last data filed at 11/21/2019 0600 Gross per 24 hour  Intake 956.28 ml  Output 265 ml  Net 691.28 ml   Filed Weights   11/19/19 1022 11/21/19 0726 11/21/19 1303  Weight: 83.2 kg 80.2 kg 81.6 kg    Exam:  . General: 54 y.o. year-old female pleasant well-developed well-nourished no distress.  Alert and oriented x3.  NG tube in place.   . Cardiovascular: Regular rate and rhythm no rubs or gallops.   Marland Kitchen. Respiratory: Clear to auscultation no  wheezes or rales.  NG tube in place with minimal output. . Abdomen: Soft nontender normal bowel sounds present.  Ileostomy in place with minimal output.   . Musculoskeletal: No lower extremity edema bilaterally. Marland Kitchen. Psychiatry: Mood is appropriate for condition and setting.   Data Reviewed: CBC: Recent Labs  Lab 11/15/19 0345 11/18/19 0326 11/20/19 1316 11/21/19 0853  WBC 5.9 5.6 8.6 6.8  NEUTROABS 3.7 3.4 6.3  --   HGB 9.3* 9.3* 9.6* 10.5*  HCT 29.6* 30.6* 30.9* 32.7*  MCV 99.7 99.0 95.1 95.3  PLT 469* 367 350 392   Basic Metabolic Panel: Recent Labs  Lab 11/15/19 0345 11/15/19 0345 11/16/19 0645 11/17/19 0622 11/18/19 0326 11/20/19 1316 11/21/19 0300  NA 139   < > 138 139 138 136 135  K 3.4*   < > 4.0 4.0 4.0 4.1 3.7  CL 106   < > 105 105 104 102 103  CO2 24   < > 23 23 24 22 23   GLUCOSE 113*   < > 119* 107* 107* 131* 124*  BUN <5*   < > 8 13 17 19 15   CREATININE  0.62   < > 0.48 0.49 0.60 0.57 0.47  CALCIUM 8.9   < > 9.4 9.5 9.6 9.8 9.6  MG 1.7  --  1.7 1.7 1.7  --  1.8  PHOS 4.0  --  3.9 4.5 4.5  --  3.9   < > = values in this interval not displayed.   GFR: Estimated Creatinine Clearance: 88 mL/min (by C-G formula based on SCr of 0.47 mg/dL). Liver Function Tests: Recent Labs  Lab 11/16/19 0645 11/17/19 0622 11/18/19 0326 11/20/19 1316 11/21/19 0300  AST 16 16 19  50* 34  ALT 12 11 13  38 35  ALKPHOS 88 92 103 146* 137*  BILITOT 0.2* 0.3 0.5 0.8 0.9  PROT 6.3* 6.5 6.9 7.0 6.8  ALBUMIN 3.0* 3.0* 3.1* 3.2* 3.3*   No results for input(s): LIPASE, AMYLASE in the last 168 hours. No results for input(s): AMMONIA in the last 168 hours. Coagulation Profile: No results for input(s): INR, PROTIME in the last 168 hours. Cardiac Enzymes: No results for input(s): CKTOTAL, CKMB, CKMBINDEX, TROPONINI in the last 168 hours. BNP (last 3 results) No results for input(s): PROBNP in the last 8760 hours. HbA1C: No results for input(s): HGBA1C in the last 72  hours. CBG: Recent Labs  Lab 11/16/19 1207 11/16/19 1801 11/16/19 2322 11/17/19 0558 11/17/19 1202  GLUCAP 113* 119* 138* 104* 111*   Lipid Profile: No results for input(s): CHOL, HDL, LDLCALC, TRIG, CHOLHDL, LDLDIRECT in the last 72 hours. Thyroid Function Tests: No results for input(s): TSH, T4TOTAL, FREET4, T3FREE, THYROIDAB in the last 72 hours. Anemia Panel: No results for input(s): VITAMINB12, FOLATE, FERRITIN, TIBC, IRON, RETICCTPCT in the last 72 hours. Urine analysis:    Component Value Date/Time   COLORURINE YELLOW 10/24/2019 1822   APPEARANCEUR TURBID (A) 10/24/2019 1822   LABSPEC 1.013 10/24/2019 1822   PHURINE 5.0 10/24/2019 1822   GLUCOSEU NEGATIVE 10/24/2019 1822   HGBUR MODERATE (A) 10/24/2019 1822   BILIRUBINUR NEGATIVE 10/24/2019 1822   KETONESUR 5 (A) 10/24/2019 1822   PROTEINUR 100 (A) 10/24/2019 1822   UROBILINOGEN 0.2 08/23/2006 1144   NITRITE NEGATIVE 10/24/2019 1822   LEUKOCYTESUR LARGE (A) 10/24/2019 1822   Sepsis Labs: @LABRCNTIP (procalcitonin:4,lacticidven:4)  )No results found for this or any previous visit (from the past 240 hour(s)).    Studies: DG Abd 1 View  Result Date: 11/21/2019 CLINICAL DATA:  Repositioning NG tube EXAM: ABDOMEN - 1 VIEW COMPARISON:  11/20/2019 FINDINGS: The enteric tube has withdrawn with tip now projected over the lower esophagus and proximal side hole in the mid to lower esophagus. Gas distended small bowel suggesting small bowel obstruction. Lung bases are clear. IMPRESSION: Enteric tube tip now projects over the lower esophagus. Advancement is suggested. Gas distended small bowel suggesting small bowel obstruction. Electronically Signed   By: M.D.   On: 11/21/2019 06:58   DG Abd Portable 1V  Result Date: 11/20/2019 CLINICAL DATA:  NG tube placement EXAM: PORTABLE ABDOMEN - 1 VIEW COMPARISON:  11/18/2019, CT 11/13/2019 FINDINGS: Partially visualized central venous catheter tip at the SVC.  Esophageal tube tip and side port overlie the gastric fundus. Apparent interval worsening of small bowel distension. IMPRESSION: Esophageal tube tip overlies the gastric fundus. Apparent interval worsening of small bowel distension. Electronically Signed   By: 11/22/2019 M.D.   On: 11/20/2019 19:28    Scheduled Meds: . [MAR Hold] Chlorhexidine Gluconate Cloth  6 each Topical Daily  . [MAR Hold] enoxaparin (LOVENOX) injection  40 mg Subcutaneous  Q24H  . [MAR Hold] menthol-cetylpyridinium  1 lozenge Oral Q2H while awake  . [MAR Hold] metoprolol tartrate  5 mg Intravenous Q8H  . [MAR Hold] nicotine  7 mg Transdermal Daily  . [MAR Hold] pantoprazole (PROTONIX) IV  40 mg Intravenous Q24H  . [MAR Hold] psyllium  1 packet Oral Daily  . [MAR Hold] sodium chloride flush  10-40 mL Intracatheter Q12H    Continuous Infusions: . [MAR Hold] methocarbamol (ROBAXIN) IV 1,000 mg (11/21/19 5093)  . [MAR Hold] piperacillin-tazobactam (ZOSYN)  IV 3.375 g (11/21/19 2671)  . TPN ADULT (ION) 85 mL/hr at 11/20/19 1925  . TPN ADULT (ION)       LOS: 28 days     Darlin Drop, MD [facility] Pager [phone]  If 7PM-7AM, please contact night-coverage [email or web address] Password [email or web address] 11/21/2019, 1:12 PM

## 2019-11-21 NOTE — Interval H&P Note (Signed)
History and Physical Interval Note:  11/21/2019 1:07 PM  Susan Todd  has presented today for surgery, with the diagnosis of COLON OBSTRUCTION.  The various methods of treatment have been discussed with the patient and family. After consideration of risks, benefits and other options for treatment, the patient has consented to  Procedure(s): EXPLORATORY LAPAROTOMY POSSIBLE COLON RESECTION POSSIBLE COLOSTOMY (N/A) CYSTOSCOPY WITH STENT PLACEMENT (Bilateral) as a surgical intervention.  The patient's history has been reviewed, patient examined, no change in status, stable for surgery.  I have reviewed the patient's chart and labs.  Questions were answered to the patient's satisfaction.     Valarie Merino

## 2019-11-22 ENCOUNTER — Encounter (HOSPITAL_COMMUNITY): Payer: Self-pay | Admitting: Surgery

## 2019-11-22 LAB — CBC WITH DIFFERENTIAL/PLATELET
Abs Immature Granulocytes: 0.04 10*3/uL (ref 0.00–0.07)
Basophils Absolute: 0 10*3/uL (ref 0.0–0.1)
Basophils Relative: 0 %
Eosinophils Absolute: 0 10*3/uL (ref 0.0–0.5)
Eosinophils Relative: 0 %
HCT: 26.5 % — ABNORMAL LOW (ref 36.0–46.0)
Hemoglobin: 8.4 g/dL — ABNORMAL LOW (ref 12.0–15.0)
Immature Granulocytes: 1 %
Lymphocytes Relative: 13 %
Lymphs Abs: 1.1 10*3/uL (ref 0.7–4.0)
MCH: 30.3 pg (ref 26.0–34.0)
MCHC: 31.7 g/dL (ref 30.0–36.0)
MCV: 95.7 fL (ref 80.0–100.0)
Monocytes Absolute: 0.7 10*3/uL (ref 0.1–1.0)
Monocytes Relative: 9 %
Neutro Abs: 6.4 10*3/uL (ref 1.7–7.7)
Neutrophils Relative %: 77 %
Platelets: 343 10*3/uL (ref 150–400)
RBC: 2.77 MIL/uL — ABNORMAL LOW (ref 3.87–5.11)
RDW: 14 % (ref 11.5–15.5)
WBC: 8.3 10*3/uL (ref 4.0–10.5)
nRBC: 0 % (ref 0.0–0.2)

## 2019-11-22 LAB — COMPREHENSIVE METABOLIC PANEL
ALT: 44 U/L (ref 0–44)
AST: 45 U/L — ABNORMAL HIGH (ref 15–41)
Albumin: 2.7 g/dL — ABNORMAL LOW (ref 3.5–5.0)
Alkaline Phosphatase: 104 U/L (ref 38–126)
Anion gap: 9 (ref 5–15)
BUN: 14 mg/dL (ref 6–20)
CO2: 22 mmol/L (ref 22–32)
Calcium: 8.8 mg/dL — ABNORMAL LOW (ref 8.9–10.3)
Chloride: 104 mmol/L (ref 98–111)
Creatinine, Ser: 0.51 mg/dL (ref 0.44–1.00)
GFR calc Af Amer: >60 mL/min (ref >60)
GFR calc non Af Amer: >60 mL/min (ref >60)
Glucose, Bld: 164 mg/dL — ABNORMAL HIGH (ref 70–99)
Potassium: 4.4 mmol/L (ref 3.5–5.1)
Sodium: 135 mmol/L (ref 135–145)
Total Bilirubin: 0.9 mg/dL (ref 0.3–1.2)
Total Protein: 5.8 g/dL — ABNORMAL LOW (ref 6.5–8.1)

## 2019-11-22 LAB — MAGNESIUM: Magnesium: 1.7 mg/dL (ref 1.7–2.4)

## 2019-11-22 MED ORDER — MAGNESIUM SULFATE 2 GM/50ML IV SOLN
2.0000 g | Freq: Once | INTRAVENOUS | Status: AC
  Administered 2019-11-22: 2 g via INTRAVENOUS
  Filled 2019-11-22: qty 50

## 2019-11-22 MED ORDER — TRAVASOL 10 % IV SOLN
INTRAVENOUS | Status: AC
  Filled 2019-11-22: qty 999.6

## 2019-11-22 NOTE — Consult Note (Addendum)
WOC follow-up: Pt went to the OR yesterday but did not have a stoma revision performed.  Current pouch was applied in the OR and is intact with good seal, stoma is red and viable and mod amt liquid brown stool is in the bag.  5 sets of barrier rings/wafers/pouches ordered to the room for patient and staff nurse use.  Pt has been through multiple pouching educational sessions and states she feels comfortable with pouch emptying and application. Previously placed on Secure Start program. Cammie Mcgee MSN, RN, [facility], [place], [place] [phone]

## 2019-11-22 NOTE — Plan of Care (Signed)
  Problem: Health Behavior/Discharge Planning: Goal: Ability to manage health-related needs will improve Outcome: Progressing   Problem: Clinical Measurements: Goal: Ability to maintain clinical measurements within normal limits will improve Outcome: Progressing Goal: Will remain free from infection Outcome: Progressing Goal: Diagnostic test results will improve Outcome: Progressing Goal: Respiratory complications will improve Outcome: Progressing Goal: Cardiovascular complication will be avoided Outcome: Progressing   Problem: Nutrition: Goal: Adequate nutrition will be maintained Outcome: Progressing   Problem: Pain Managment: Goal: General experience of comfort will improve Outcome: Progressing   Problem: Safety: Goal: Ability to remain free from injury will improve Outcome: Progressing   Problem: Clinical Measurements: Goal: Ability to maintain clinical measurements within normal limits will improve Outcome: Progressing Goal: Postoperative complications will be avoided or minimized Outcome: Progressing   Problem: Skin Integrity: Goal: Demonstration of wound healing without infection will improve Outcome: Progressing   Problem: Education: Goal: Knowledge of ostomy care will improve Outcome: Progressing Goal: Understanding of discharge needs will improve Outcome: Progressing   Problem: Bowel/Gastric/Urinary: Goal: Gastrointestinal status for postoperative course will improve Outcome: Progressing   Problem: Coping: Goal: Coping ability will improve Outcome: Progressing   Problem: Fluid Volume: Goal: Ability to achieve a balanced intake and output will improve Outcome: Progressing   Problem: Health Behavior/Discharge Planning: Goal: Ability to manage health-related needs will improve Outcome: Progressing   Problem: Nutrition: Goal: Will attain and maintain optimal nutritional status will improve Outcome: Progressing   Problem: Clinical  Measurements: Goal: Postoperative complications will be avoided or minimized Outcome: Progressing   Problem: Skin Integrity: Goal: Will show signs of wound healing Outcome: Progressing Goal: Risk for impaired skin integrity will decrease Outcome: Progressing

## 2019-11-22 NOTE — Progress Notes (Signed)
Patient refused ambulation at this time, states pain is too high, encouraged to use pca, will continue to monitor and encourage.

## 2019-11-22 NOTE — Progress Notes (Signed)
PROGRESS NOTE  Susan Todd LPF:[id number] DOB: August 21, 1965 DOA: 10/24/2019 PCP: Georgann Housekeeper, MD  HPI/Recap of past 29 hours: 54 year old lady prior history of hypertension, hyperlipidemia, anxiety, depression, seizure disorder, COPD presents on 9/2 with worsening abdominal pain.  CT of the abdomen pelvis showed several abscesses within the abdomen along with colovaginal fistula loss.  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  GI and general surgery were consulted.  Patient underwent flex sigmoidoscopy on 10/30/2019 showing diverticulosis in the rectosigmoid and sigmoid colon along with stenosis and narrowing.  GI recommended outpatient follow-up with her colonoscopy.  Repeat CT of the abdomen pelvis on 9/9 showed central loculated abscess within the pelvis increased in size and not amenable to percutaneous drainage given its location.  She underwent diagnostic laparotomy with laparoscopic diverting loop ileostomy on 11/04/2019.  MRI of the pelvis on 11/05/2019 demonstrated abscess/fistula cavity in the central pelvis containing air and fluid interspersed between the mid sigmoid colon and rectum measuring 3.7 into 3.1 cm suspicious for abscess.   General surgery, on board and following the patient. On 9/22 CT abd and pelvis  Shows small bowel obstruction, with transition point  identified within the right lower quadrant of the abdomen in the area of the inflamed appendix. She is made NPO and Ng tube placed. TPN started for nutrition .  She continues to have brown fluid aspirated from the NG tube. Abdominal x-ray on 9/27 recommends advancing the NG tube 3 cm for better output.  Unfortunately the obstruction is proximal to her ileostomy. She will probably need repeat imaging with a CT vs laparotomy .   Patient seen and examined, teary appears frustrated that her obstruction is not getting better.   Requested to increase her IV Ativan.  Post laparoscopy, enterolysis and opening of pelvic abscess, sigmoidoscopy, appendectomy on 11/21/2019 by Dr. Daphine Deutscher and Dr. Cliffton Asters.  11/22/19: Seen and examined.  She is pleased with the use of PCA for pain control.    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  Not amenable to IR drainage given its location. S/p laparotomy with laparoscopic diverting loop ileostomy. Patient has been on IV Zosyn from 10/24/2019 till 11/13/2019, general surgery wants to continue the IV antibiotics for persistent abscess. CT of the abdomen and pelvis on 11/13/19 showing small bowel obstruction with transition point in the right lower quadrant at the site of the appendix.  NG tube was placed , connected to intermittent suction.  TPN started for nutrition .   Post laparoscopy, enterolysis and opening of pelvic abscess, sigmoidoscopy, appendectomy on 11/21/2019 by Dr. Daphine Deutscher and Dr. Cliffton Asters. We appreciate general surgery's assistance.  Small bowel obstruction Management as per above Goal potassium greater than 4.0, goal magnesium greater than 2.0. Continue to monitor electrolytes and replete as indicated.  Anxiety and depression. Increased ativan to 1 mg every 4 hours prn.   COPD:  Stable No wheezing heard.   Continue with bronchodilators as needed  Resolved hypokalemia post repletion Serum potassium 4.4 Repeat not indicated  Hypomagnesemia Serum magnesium 1.7 Replete as indicated.  Hyperphosphatemia:  Replaced  Seizures:  None this admission.  Not on specific home  AEDs  GERD On PPI IV daily.   Anemia of chronic  disease/acute blood loss anemia post surgery. Drop in hemoglobin 8.4 from 9.9 Continue to monitor H&H  Essential hypertension BP is at goal. On IV Lopressor 5 mg 3 times daily. Continue to monitor vital signs    DVT prophylaxis: lovenox SQ daily.  Code Status: full code.  Family Communication: (none at bedside)    Consultants:   General surgery  Procedures:  9/8-Flex sigmoidoscopy revealed diverticulosis in the rectosigmoid and sigmoid colon along with the stenosis and narrowing. Biopsies from colon and rectum negative for inflammation or malignancy 9/13-diagnostic laparotomy with laparoscopic diverting loop ileostomy  11/21/2019-Laparoscopy, enterolysis and opening of pelvic abscess, sigmoidoscopy, appendectomy on 11/21/2019 by Dr. Daphine DeutscherMartin and Dr. Cliffton AstersWhite.        Status is: Inpatient    Dispo: The patient is from: Home              Anticipated d/c is WU:JWJXto:Home with home health services.               Anticipated d/c date is: 11/25/2019.              Patient currently no stable for discharge due to management of intra-abdominal abscesses, SBO, severe protein calorie malnutrition, on TPN.       Objective: Vitals:   11/22/19 0558 11/22/19 0749 11/22/19 1100 11/22/19 1159  BP: 105/78  111/78   Pulse: 72  79   Resp: 16 14 16 12   Temp: 97.7 F (36.5 C)  98.9 F (37.2 C)   TempSrc: Oral     SpO2: 100% 99% 98% 95%  Weight:      Height:        Intake/Output Summary (Last 24 hours) at 11/22/2019 1259 Last data filed at 11/22/2019 0900 Gross per 24 hour  Intake 3493.79 ml  Output 1995 ml  Net 1498.79 ml   Filed Weights   11/19/19 1022 11/21/19 0726 11/21/19 1303  Weight: 83.2 kg 80.2 kg 81.6 kg    Exam:  . General: 54 y.o. year-old female pleasant well-developed well-nourished no acute stress.  Alert and oriented x3.   . Cardiovascular: Regular rate and rhythm no rubs or gallops.   Marland Kitchen. Respiratory: Clear to Auscultation No Wheezes or Rales.  . Abdomen:  Soft nontender bowel sounds present.  Ileostomy bag in place.   . Musculoskeletal: No lower extremity edema bilaterally. Marland Kitchen. Psychiatry: Mood is appropriate for condition and setting.   Data Reviewed: CBC: Recent Labs  Lab 11/18/19 0326 11/20/19 1316 11/21/19 0853 11/21/19 1815 11/22/19 0524  WBC 5.6 8.6 6.8 6.3 8.3  NEUTROABS 3.4 6.3  --   --  6.4  HGB 9.3* 9.6* 10.5* 9.9* 8.4*  HCT 30.6* 30.9* 32.7* 31.4* 26.5*  MCV 99.0 95.1 95.3 97.5 95.7  PLT 367 350 392 297 343   Basic Metabolic Panel: Recent Labs  Lab 11/16/19 0645 11/16/19 0645 11/17/19 0622 11/17/19 0622 11/18/19 0326 11/20/19 1316 11/21/19 0300 11/21/19 1815 11/22/19 0524  NA 138   < > 139  --  138 136 135  --  135  K 4.0   < > 4.0  --  4.0 4.1 3.7  --  4.4  CL 105   < > 105  --  104 102 103  --  104  CO2 23   < > 23  --  24 22 23   --  22  GLUCOSE 119*   < > 107*  --  107* 131*  124*  --  164*  BUN 8   < > 13  --  17 19 15   --  14  CREATININE 0.48   < > 0.49   < > 0.60 0.57 0.47 0.52 0.51  CALCIUM 9.4   < > 9.5  --  9.6 9.8 9.6  --  8.8*  MG 1.7  --  1.7  --  1.7  --  1.8  --  1.7  PHOS 3.9  --  4.5  --  4.5  --  3.9  --   --    < > = values in this interval not displayed.   GFR: Estimated Creatinine Clearance: 88 mL/min (by C-G formula based on SCr of 0.51 mg/dL). Liver Function Tests: Recent Labs  Lab 11/17/19 0622 11/18/19 0326 11/20/19 1316 11/21/19 0300 11/22/19 0524  AST 16 19 50* 34 45*  ALT 11 13 38 35 44  ALKPHOS 92 103 146* 137* 104  BILITOT 0.3 0.5 0.8 0.9 0.9  PROT 6.5 6.9 7.0 6.8 5.8*  ALBUMIN 3.0* 3.1* 3.2* 3.3* 2.7*   No results for input(s): LIPASE, AMYLASE in the last 168 hours. No results for input(s): AMMONIA in the last 168 hours. Coagulation Profile: No results for input(s): INR, PROTIME in the last 168 hours. Cardiac Enzymes: No results for input(s): CKTOTAL, CKMB, CKMBINDEX, TROPONINI in the last 168 hours. BNP (last 3 results) No results for input(s): PROBNP in the  last 8760 hours. HbA1C: No results for input(s): HGBA1C in the last 72 hours. CBG: Recent Labs  Lab 11/16/19 1207 11/16/19 1801 11/16/19 2322 11/17/19 0558 11/17/19 1202  GLUCAP 113* 119* 138* 104* 111*   Lipid Profile: No results for input(s): CHOL, HDL, LDLCALC, TRIG, CHOLHDL, LDLDIRECT in the last 72 hours. Thyroid Function Tests: No results for input(s): TSH, T4TOTAL, FREET4, T3FREE, THYROIDAB in the last 72 hours. Anemia Panel: No results for input(s): VITAMINB12, FOLATE, FERRITIN, TIBC, IRON, RETICCTPCT in the last 72 hours. Urine analysis:    Component Value Date/Time   COLORURINE YELLOW 10/24/2019 1822   APPEARANCEUR TURBID (A) 10/24/2019 1822   LABSPEC 1.013 10/24/2019 1822   PHURINE 5.0 10/24/2019 1822   GLUCOSEU NEGATIVE 10/24/2019 1822   HGBUR MODERATE (A) 10/24/2019 1822   BILIRUBINUR NEGATIVE 10/24/2019 1822   KETONESUR 5 (A) 10/24/2019 1822   PROTEINUR 100 (A) 10/24/2019 1822   UROBILINOGEN 0.2 08/23/2006 1144   NITRITE NEGATIVE 10/24/2019 1822   LEUKOCYTESUR LARGE (A) 10/24/2019 1822   Sepsis Labs: @LABRCNTIP (procalcitonin:4,lacticidven:4)  )No results found for this or any previous visit (from the past 240 hour(s)).    Studies: No results found.  Scheduled Meds: . Chlorhexidine Gluconate Cloth  6 each Topical Daily  . enoxaparin (LOVENOX) injection  40 mg Subcutaneous Q24H  . HYDROmorphone   Intravenous Q4H  . menthol-cetylpyridinium  1 lozenge Oral Q2H while awake  . metoprolol tartrate  5 mg Intravenous Q8H  . nicotine  7 mg Transdermal Daily  . pantoprazole (PROTONIX) IV  40 mg Intravenous Q24H  . psyllium  1 packet Oral Daily  . sodium chloride flush  10-40 mL Intracatheter Q12H    Continuous Infusions: . acetaminophen 1,000 mg (11/22/19 1040)  . methocarbamol (ROBAXIN) IV Stopped (11/22/19 0500)  . piperacillin-tazobactam (ZOSYN)  IV 3.375 g (11/22/19 0748)  . TPN ADULT (ION) 85 mL/hr at 11/22/19 0600  . TPN ADULT (ION)       LOS:  29 days     01/22/20, MD [facility] Pager [phone]  If 7PM-7AM, please contact night-coverage [email or web address] Password [email or web address] 11/22/2019, 12:59 PM

## 2019-11-22 NOTE — Progress Notes (Signed)
1 Day Post-Op  Subjective: Patient is relieved after surgery yesterday.  We had a long discussion about what occurred and the findings.  She has pain but seems controlled with her PCA.  No nausea.    ROS: See above, otherwise other systems negative  Objective: Vital signs in last 24 hours: Temp:  [95.8 F (35.4 C)-98.1 F (36.7 C)] 97.7 F (36.5 C) (10/01 0558) Pulse Rate:  [72-109] 72 (10/01 0558) Resp:  [8-18] 14 (10/01 0749) BP: (105-157)/(78-120) 105/78 (10/01 0558) SpO2:  [97 %-100 %] 99 % (10/01 0749) Weight:  [81.6 kg] 81.6 kg (09/30 1303) Last BM Date: 11/21/19  Intake/Output from previous day: 09/30 0701 - 10/01 0700 In: 3393.8 [P.O.:180; I.V.:2713.9; IV Piggyback:499.9] Out: 1995 [Urine:1725; Drains:170; Stool:50; Blood:50] Intake/Output this shift: No intake/output data recorded.  PE: Abd: soft, appropriately tender, some BS, minimal bilious output in ileostomy so far today.  Stoma is pink and viable.  Incisions c/d/i  Lab Results:  Recent Labs    11/21/19 1815 11/22/19 0524  WBC 6.3 8.3  HGB 9.9* 8.4*  HCT 31.4* 26.5*  PLT 297 343   BMET Recent Labs    11/21/19 0300 11/21/19 0300 11/21/19 1815 11/22/19 0524  NA 135  --   --  135  K 3.7  --   --  4.4  CL 103  --   --  104  CO2 23  --   --  22  GLUCOSE 124*  --   --  164*  BUN 15  --   --  14  CREATININE 0.47   < > 0.52 0.51  CALCIUM 9.6  --   --  8.8*   < > = values in this interval not displayed.   PT/INR No results for input(s): LABPROT, INR in the last 72 hours. CMP     Component Value Date/Time   NA 135 11/22/2019 0524   K 4.4 11/22/2019 0524   CL 104 11/22/2019 0524   CO2 22 11/22/2019 0524   GLUCOSE 164 (H) 11/22/2019 0524   BUN 14 11/22/2019 0524   CREATININE 0.51 11/22/2019 0524   CREATININE 0.68 10/14/2010 1059   CALCIUM 8.8 (L) 11/22/2019 0524   PROT 5.8 (L) 11/22/2019 0524   ALBUMIN 2.7 (L) 11/22/2019 0524   AST 45 (H) 11/22/2019 0524   ALT 44 11/22/2019 0524    ALKPHOS 104 11/22/2019 0524   BILITOT 0.9 11/22/2019 0524   GFRNONAA >60 11/22/2019 0524   GFRAA >60 11/22/2019 0524   Lipase     Component Value Date/Time   LIPASE 18 09/19/2019 1459       Studies/Results: DG Abd 1 View  Result Date: 11/21/2019 CLINICAL DATA:  Repositioning NG tube EXAM: ABDOMEN - 1 VIEW COMPARISON:  11/20/2019 FINDINGS: The enteric tube has withdrawn with tip now projected over the lower esophagus and proximal side hole in the mid to lower esophagus. Gas distended small bowel suggesting small bowel obstruction. Lung bases are clear. IMPRESSION: Enteric tube tip now projects over the lower esophagus. Advancement is suggested. Gas distended small bowel suggesting small bowel obstruction. Electronically Signed   By: Burman Nieves M.D.   On: 11/21/2019 06:58   DG Abd Portable 1V  Result Date: 11/20/2019 CLINICAL DATA:  NG tube placement EXAM: PORTABLE ABDOMEN - 1 VIEW COMPARISON:  11/18/2019, CT 11/13/2019 FINDINGS: Partially visualized central venous catheter tip at the SVC. Esophageal tube tip and side port overlie the gastric fundus. Apparent interval worsening of small bowel distension. IMPRESSION:  Esophageal tube tip overlies the gastric fundus. Apparent interval worsening of small bowel distension. Electronically Signed   By: Jasmine Pang M.D.   On: 11/20/2019 19:28    Anti-infectives: Anti-infectives (From admission, onward)   Start     Dose/Rate Route Frequency Ordered Stop   11/21/19 1328  piperacillin-tazobactam (ZOSYN) 3.375 GM/50ML IVPB       Note to Pharmacy: Nadene Rubins   : cabinet override      11/21/19 1328 11/21/19 1336   11/14/19 1600  piperacillin-tazobactam (ZOSYN) IVPB 3.375 g        3.375 g 12.5 mL/hr over 240 Minutes Intravenous Every 8 hours 11/14/19 1514     11/03/19 0845  cefoTEtan (CEFOTAN) 2 g in sodium chloride 0.9 % 100 mL IVPB  Status:  Discontinued        2 g 200 mL/hr over 30 Minutes Intravenous On call to O.R. 11/03/19 0831  11/03/19 0842   10/25/19 1000  vancomycin (VANCOCIN) IVPB 1000 mg/200 mL premix  Status:  Discontinued        1,000 mg 200 mL/hr over 60 Minutes Intravenous Every 12 hours 10/24/19 1835 10/28/19 1727   10/24/19 2300  piperacillin-tazobactam (ZOSYN) IVPB 3.375 g  Status:  Discontinued        3.375 g 12.5 mL/hr over 240 Minutes Intravenous Every 8 hours 10/24/19 1835 11/13/19 1557   10/24/19 2000  vancomycin (VANCOREADY) IVPB 1500 mg/300 mL  Status:  Discontinued        1,500 mg 150 mL/hr over 120 Minutes Intravenous  Once 10/24/19 1835 10/24/19 1836   10/24/19 2000  vancomycin (VANCOREADY) IVPB 1500 mg/300 mL        1,500 mg 150 mL/hr over 120 Minutes Intravenous  Once 10/24/19 1836 10/24/19 2229   10/24/19 1730  piperacillin-tazobactam (ZOSYN) IVPB 3.375 g        3.375 g 100 mL/hr over 30 Minutes Intravenous  Once 10/24/19 1721 10/24/19 1810       Assessment/Plan COPD History of seizures Anxiety/depression Anemia-Hgb stable Hypokalemia-resolved   POD18, 1 , s/pLAPAROSCOPIC diverting loop ileostomy- 11/04/2019 - Whitefor complicated diverticulitis, 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 Burley Saver 11/21/19 -NGT discontinued.  May have sips of clears and ice -mobilize, pulm toilet -await ileostomy output -maybe 3 more days of zosyn and discontinued since abscess has now been drained -cont TNA until able to take in oral diet  FEN -NPO/IVFs/TNA VTE -Lovenox ID -zosyn 9/2 -->DC 3 days postop   LOS: 29 days    Letha Cape , [facility] Surgery 11/22/2019, 10:14 AM Please see Amion for pager number during day hours 7:00am-4:30pm or 7:00am -11:30am on weekends

## 2019-11-22 NOTE — Progress Notes (Signed)
PHARMACY - TOTAL PARENTERAL NUTRITION CONSULT NOTE   Indication: Prolonged ileus, SBO  Patient Measurements: Height: 5\' 8"  (172.7 cm) Weight: 81.6 kg (180 lb) IBW/kg (Calculated) : 63.9 TPN AdjBW (KG): 68.3 Body mass index is 27.37 kg/m.  Assessment: Patient is a 54 y.o F who was instructed by her gastroenterologist to come to the ED on 9/2 for treatment of intra-abdominal abscesses.  She underwent flex sigmoidoscopy on 9/8 with biopsies taken and noted diverticular disease.  She subsequently underwent lap diverting loop ileostomy on 9/13.  Abdominal CT on 9/22 showed SBO.  Pharmacy is consulted on 9/23 to start TPN.  Glucose / Insulin: CBGs were controlled, now d/c. Serum glucose is controlled at < 180.  Electrolytes: Mag 1.7 below goal > 2 (despite replacement), Others WNL including goal K >4. Renal: Scr, BUN wnl LFTs / TGs: LFTs wnl, TG 163 (9/27) Prealbumin / albumin: albumin 2.7; pre-albumin inc to 20 (9/27) Intake / Output; MIVF:  - NGT stopped overnight 9/29-9/30, NGT removed during surgery 9/30. - Colostomy output decreased to 50 mL/24 hrs; Drain output 170 mL - net I/O inaccurate, unmeasured UOP x2 - D5 0.45%NaCl + KCl 20 mEq/L at 50 ml/hr (9/30 > ) GI Imaging: - 9/1 abd CT: Enlarging cystic, fluid containing area in the RIGHT adnexa may represent worsening of abscess adjacent to or involving the ovary. 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 a centrally located abscess within the pelvis which has demonstrated interval increase in size. - 9/14 pelvis MRI: Suspicious of abscess or fistula related to prior tip appendicitis or alternately diverticulitis.  The presence of air and fluid in the vagina is again highly suspicious  for vaginal fistula. - 9/22 abd CT: positive for small bowel obstruction.  Decrease in size of gas and fluid collection.  Similar appearance of inflammatory changes of the sigmoid colon with small fistula tract.  Surgeries / Procedures:  - 9/8 flex sigmoidoscopy: biopsies taken and noted diverticular disease - 9/13: lap diverting loop ileostomy - 9/30: Laparoscopy, enterolysis and opening of pelvic abscess, sigmoidoscopy, appendectomy, Cystoscopy, bilateral open ended catheter placement  Central access: PICC placed 9/23 TPN start date: 11/14/2019  Nutritional Goals (per RD recommendation on 9/30): kCal: 1900-2100, Protein: 95-105, Fluid: 2L/day Goal TPN rate is 85 mL/hr (provides 100 g of protein and 2020 kcals per day)  Current Nutrition: Clear liquid diet (start 9/30 > )  Plan:  Now: - Magnesium 2g IV bolus At 18:00 - Continue TPN at goal rate of 85 ml/hr  - Electrolytes in TPN: 82mEq/L of Na, 38mEq/L of K, 77mEq/L of Ca, 10 mEq/L of Mg, and 20mmol/L of Phos. Cl:Ac ratio 1:1 - Standard MVI and trace elements to TPN - d/c SSI/CBG checks due to glucose within goal, stable - mIVF per CCS - OK to d/c additional IVF today. - Monitor TPN labs on Mon/Thurs, BMET/Mag on Saturday - Follow up enteral intake   Thursday PharmD, BCPS Clinical Pharmacist [facility] main pharmacy [phone] 11/22/2019 7:23 AM

## 2019-11-23 LAB — BASIC METABOLIC PANEL
Anion gap: 7 (ref 5–15)
BUN: 12 mg/dL (ref 6–20)
CO2: 25 mmol/L (ref 22–32)
Calcium: 9.2 mg/dL (ref 8.9–10.3)
Chloride: 105 mmol/L (ref 98–111)
Creatinine, Ser: 0.48 mg/dL (ref 0.44–1.00)
GFR calc Af Amer: >60 mL/min (ref >60)
GFR calc non Af Amer: >60 mL/min (ref >60)
Glucose, Bld: 112 mg/dL — ABNORMAL HIGH (ref 70–99)
Potassium: 4 mmol/L (ref 3.5–5.1)
Sodium: 137 mmol/L (ref 135–145)

## 2019-11-23 LAB — CBC
HCT: 26.5 % — ABNORMAL LOW (ref 36.0–46.0)
Hemoglobin: 8 g/dL — ABNORMAL LOW (ref 12.0–15.0)
MCH: 29.5 pg (ref 26.0–34.0)
MCHC: 30.2 g/dL (ref 30.0–36.0)
MCV: 97.8 fL (ref 80.0–100.0)
Platelets: 357 10*3/uL (ref 150–400)
RBC: 2.71 MIL/uL — ABNORMAL LOW (ref 3.87–5.11)
RDW: 14.6 % (ref 11.5–15.5)
WBC: 6.5 10*3/uL (ref 4.0–10.5)
nRBC: 0 % (ref 0.0–0.2)

## 2019-11-23 LAB — MAGNESIUM: Magnesium: 1.8 mg/dL (ref 1.7–2.4)

## 2019-11-23 MED ORDER — TRAVASOL 10 % IV SOLN
INTRAVENOUS | Status: AC
  Filled 2019-11-23: qty 999.6

## 2019-11-23 MED ORDER — OXYCODONE HCL 5 MG PO TABS
5.0000 mg | ORAL_TABLET | ORAL | Status: DC | PRN
  Administered 2019-11-23 – 2019-11-24 (×3): 10 mg via ORAL
  Filled 2019-11-23 (×3): qty 2

## 2019-11-23 MED ORDER — POTASSIUM CHLORIDE 10 MEQ/100ML IV SOLN
10.0000 meq | INTRAVENOUS | Status: DC
  Administered 2019-11-23: 10 meq via INTRAVENOUS
  Filled 2019-11-23 (×2): qty 100

## 2019-11-23 MED ORDER — MAGNESIUM SULFATE 2 GM/50ML IV SOLN
2.0000 g | Freq: Once | INTRAVENOUS | Status: AC
  Administered 2019-11-23: 2 g via INTRAVENOUS
  Filled 2019-11-23: qty 50

## 2019-11-23 MED ORDER — HYDROMORPHONE HCL 1 MG/ML IJ SOLN
1.0000 mg | INTRAMUSCULAR | Status: DC | PRN
  Administered 2019-11-23 – 2019-11-26 (×12): 1 mg via INTRAVENOUS
  Filled 2019-11-23 (×14): qty 1

## 2019-11-23 NOTE — Progress Notes (Signed)
2 Days Post-Op   Subjective/Chief Complaint: Pt doing well tol CLD Some pain but managed well   Objective: Vital signs in last 24 hours: Temp:  [97.8 F (36.6 C)-98.9 F (37.2 C)] 97.9 F (36.6 C) (10/02 0756) Pulse Rate:  [71-79] 72 (10/02 0756) Resp:  [11-17] 17 (10/02 0756) BP: (99-117)/(64-78) 104/65 (10/02 0756) SpO2:  [95 %-100 %] 99 % (10/02 0756) Last BM Date: 11/21/19  Intake/Output from previous day: 10/01 0701 - 10/02 0700 In: 2918.8 [P.O.:310; I.V.:1954.1; IV Piggyback:654.8] Out: 140 [Drains:40; Stool:100] Intake/Output this shift: No intake/output data recorded.  General appearance: alert and cooperative GI: soft, non-tender; bowel sounds normal; no masses,  no organomegaly and inc c/d/i, ostomy patent, Dr-SS  Lab Results:  Recent Labs    11/21/19 1815 11/22/19 0524  WBC 6.3 8.3  HGB 9.9* 8.4*  HCT 31.4* 26.5*  PLT 297 343   BMET Recent Labs    11/22/19 0524 11/23/19 0351  NA 135 137  K 4.4 4.0  CL 104 105  CO2 22 25  GLUCOSE 164* 112*  BUN 14 12  CREATININE 0.51 0.48  CALCIUM 8.8* 9.2   PT/INR No results for input(s): LABPROT, INR in the last 72 hours. ABG No results for input(s): PHART, HCO3 in the last 72 hours.  Invalid input(s): PCO2, PO2  Studies/Results: No results found.  Anti-infectives: Anti-infectives (From admission, onward)   Start     Dose/Rate Route Frequency Ordered Stop   11/21/19 1328  piperacillin-tazobactam (ZOSYN) 3.375 GM/50ML IVPB       Note to Pharmacy: Nadene Rubins   : cabinet override      11/21/19 1328 11/21/19 1336   11/14/19 1600  piperacillin-tazobactam (ZOSYN) IVPB 3.375 g        3.375 g 12.5 mL/hr over 240 Minutes Intravenous Every 8 hours 11/14/19 1514     11/03/19 0845  cefoTEtan (CEFOTAN) 2 g in sodium chloride 0.9 % 100 mL IVPB  Status:  Discontinued        2 g 200 mL/hr over 30 Minutes Intravenous On call to O.R. 11/03/19 0831 11/03/19 0842   10/25/19 1000  vancomycin (VANCOCIN) IVPB 1000  mg/200 mL premix  Status:  Discontinued        1,000 mg 200 mL/hr over 60 Minutes Intravenous Every 12 hours 10/24/19 1835 10/28/19 1727   10/24/19 2300  piperacillin-tazobactam (ZOSYN) IVPB 3.375 g  Status:  Discontinued        3.375 g 12.5 mL/hr over 240 Minutes Intravenous Every 8 hours 10/24/19 1835 11/13/19 1557   10/24/19 2000  vancomycin (VANCOREADY) IVPB 1500 mg/300 mL  Status:  Discontinued        1,500 mg 150 mL/hr over 120 Minutes Intravenous  Once 10/24/19 1835 10/24/19 1836   10/24/19 2000  vancomycin (VANCOREADY) IVPB 1500 mg/300 mL        1,500 mg 150 mL/hr over 120 Minutes Intravenous  Once 10/24/19 1836 10/24/19 2229   10/24/19 1730  piperacillin-tazobactam (ZOSYN) IVPB 3.375 g        3.375 g 100 mL/hr over 30 Minutes Intravenous  Once 10/24/19 1721 10/24/19 1810      Assessment/Plan: COPD History of seizures Anxiety/depression Anemia-Hgb stable Hypokalemia-resolved   POD19, 2 , s/pLAPAROSCOPIC diverting loop ileostomy- 11/04/2019 - [facility] complicated diverticulitis, 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 Burley Saver 11/21/19 -adv to FLD -mobilize, pulm toilet -await ileostomy output -maybe 2 more days of zosyn and discontinued since abscess has now been drained -  cont TNA until able to take in oral diet -Oral pain control, DC PCA  FEN -FLD/IVFs/TNA VTE -Lovenox ID -zosyn 9/2 -->DC 3 days postop   LOS: 30 days    Susan Todd 11/23/2019

## 2019-11-23 NOTE — Progress Notes (Signed)
PROGRESS NOTE  Susan Todd ZOX:[id number] DOB: 09/08/1965 DOA: 10/24/2019 PCP: Georgann HousekeeperHusain, Karrar, MD  HPI/Recap of past 7724 hours: 54 year old lady prior history of hypertension, hyperlipidemia, chronic anxiety, depression, seizure disorder, COPD presents on 9/2 with worsening abdominal pain.  CT of the abdomen pelvis showed several abscesses within the abdomen along with colovaginal fistula.  she was started on broad-spectrum IV antibiotics.  GI and general surgery were consulted.  Patient underwent flex sigmoidoscopy on 10/30/2019 showing diverticulitis in the rectosigmoid and sigmoid colon along with stenosis and narrowing.  GI recommended outpatient follow-up with colonoscopy.  Repeat CT of the abdomen pelvis on 9/9 showed loculated abscess within the pelvis increased in size and not amenable to percutaneous drainage given its location.  She underwent diagnostic laparotomy with laparoscopic diverting loop ileostomy on 11/04/2019.  MRI of the pelvis on 11/05/2019 demonstrated abscess/fistula cavity in the central pelvis containing air and fluid interspersed between the mid sigmoid colon and rectum measuring 3.7 into 3.1 cm suspicious for abscess.   On 9/22 CT abd and pelvis with contrast Shows small bowel obstruction, with transition point  identified within the right lower quadrant of the abdomen in the area of the inflamed appendix. She was made NPO and Ng tube was placed. TPN was started for nutrition.  She had brown fluid aspirated from the NG tube. Abdominal x-ray on 9/27 recommends advancing the NG tube 3 cm for better output.  Unfortunately the obstruction is proximal to her ileostomy. She will probably need repeat imaging with a CT vs laparotomy .   Post laparoscopy, enterolysis and opening of pelvic abscess, sigmoidoscopy, appendectomy on 11/21/2019 by Dr. Daphine DeutscherMartin and Dr. Cliffton AstersWhite.  Started on PCA pump for pain control.  11/23/19: Seen and examined.  Minimal pain at the time of this exam.  She has been  ambulating and usually has abdominal pain after walking.  Assessment/Plan: Principal Problem:   Intra-abdominal abscess (HCC) Active Problems:   Depression   Anxiety   Seizures (HCC)   COPD (chronic obstructive pulmonary disease) (HCC)   Seizure (HCC)  Sepsis, improving, secondary to intra-abdominal/pelvic abscess from diverticulitis with colovaginal fistula Initial CT of the abdomen pelvis and MRI pelvis revealed multiple abscesses.  Not amenable to IR drainage given its location. S/p laparotomy with laparoscopic diverting loop ileostomy. Patient has been on IV Zosyn from 10/24/2019 till 11/13/2019, general surgery wants to continue the IV antibiotics for persistent abscess. CT of the abdomen and pelvis on 11/13/19 showing small bowel obstruction with transition point in the right lower quadrant at the site of the appendix.  NG tube was placed , connected to intermittent suction. TPN started for nutrition .   NG tube discontinued.  Post laparoscopy, enterolysis and opening of pelvic abscess, sigmoidoscopy, appendectomy on 11/21/2019 by Dr. Daphine DeutscherMartin and Dr. Cliffton AstersWhite. We appreciate general surgery's assistance.  Resolved small bowel obstruction Management as per above Goal potassium greater than 4.0, goal magnesium greater than 2.0. Continue to monitor electrolytes and replete as indicated.  Anxiety and depression. Stable Increased ativan to 1 mg every 4 hours prn.   COPD:  Stable No wheezing heard.   Continue with bronchodilators as needed  Resolved hypokalemia post repletion Serum potassium 4.4 Repeat not indicated  Resolved post repletion hypomagnesemia Serum magnesium 1.7 Replete as indicated.  Hypophosphatemia:  Replaced  Seizures:  None this admission.  Not on specific home AEDs  GERD On PPI IV daily.   Anemia of chronic disease/acute blood loss anemia post surgery. Drop in hemoglobin 8.0  from 8.4 from 9.9 Continue to monitor H&H  Essential hypertension BP is  at goal. On IV Lopressor 5 mg 3 times daily. Continue to monitor vital signs    DVT prophylaxis: lovenox SQ daily.  Code Status: full code.  Family Communication: (none at bedside)    Consultants:   General surgery  Procedures:  9/8-Flex sigmoidoscopy revealed diverticulosis in the rectosigmoid and sigmoid colon along with the stenosis and narrowing. Biopsies from colon and rectum negative for inflammation or malignancy 9/13-diagnostic laparotomy with laparoscopic diverting loop ileostomy  11/21/2019-Laparoscopy, enterolysis and opening of pelvic abscess, sigmoidoscopy, appendectomy on 11/21/2019 by Dr. Daphine Deutscher and Dr. Cliffton Asters.        Status is: Inpatient    Dispo: The patient is from: Home              Anticipated d/c is HF:[facility] with home health services.               Anticipated d/c date is: 11/25/2019.              Patient currently no stable for discharge due to management of intra-abdominal abscesses, SBO, severe protein calorie malnutrition, on TPN.       Objective: Vitals:   11/23/19 0756 11/23/19 0920 11/23/19 1145 11/23/19 1450  BP: 104/65   120/74  Pulse: 72   96  Resp: 17 16 17 16   Temp: 97.9 F (36.6 C)   98.2 F (36.8 C)  TempSrc: Oral   Oral  SpO2: 99% 98% 98% 100%  Weight:      Height:        Intake/Output Summary (Last 24 hours) at 11/23/2019 1558 Last data filed at 11/23/2019 1500 Gross per 24 hour  Intake 3334.45 ml  Output 195 ml  Net 3139.45 ml   Filed Weights   11/19/19 1022 11/21/19 0726 11/21/19 1303  Weight: 83.2 kg 80.2 kg 81.6 kg    Exam:  . General: 54 y.o. year-old female pleasant well-developed well-nourished no acute distress.  Alert and oriented x3.   . Cardiovascular: Regular rate and rhythm no rubs or gallops.   57 Respiratory: Clear consultation no wheezes or rales.   . Abdomen: Soft bowel sounds present.  Ileostomy bag in place.   . Musculoskeletal: No lower extremity edema bilaterally.   Marland Kitchen Psychiatry: Mood is  appropriate for condition and setting.  Data Reviewed: CBC: Recent Labs  Lab 11/18/19 0326 11/18/19 0326 11/20/19 1316 11/21/19 0853 11/21/19 1815 11/22/19 0524 11/23/19 1115  WBC 5.6   < > 8.6 6.8 6.3 8.3 6.5  NEUTROABS 3.4  --  6.3  --   --  6.4  --   HGB 9.3*   < > 9.6* 10.5* 9.9* 8.4* 8.0*  HCT 30.6*   < > 30.9* 32.7* 31.4* 26.5* 26.5*  MCV 99.0   < > 95.1 95.3 97.5 95.7 97.8  PLT 367   < > 350 392 297 343 357   < > = values in this interval not displayed.   Basic Metabolic Panel: Recent Labs  Lab 11/17/19 0622 11/17/19 0622 11/18/19 0326 11/18/19 0326 11/20/19 1316 11/21/19 0300 11/21/19 1815 11/22/19 0524 11/23/19 0351  NA 139   < > 138  --  136 135  --  135 137  K 4.0   < > 4.0  --  4.1 3.7  --  4.4 4.0  CL 105   < > 104  --  102 103  --  104 105  CO2 23   < >  24  --  22 23  --  22 25  GLUCOSE 107*   < > 107*  --  131* 124*  --  164* 112*  BUN 13   < > 17  --  19 15  --  14 12  CREATININE 0.49   < > 0.60   < > 0.57 0.47 0.52 0.51 0.48  CALCIUM 9.5   < > 9.6  --  9.8 9.6  --  8.8* 9.2  MG 1.7  --  1.7  --   --  1.8  --  1.7 1.8  PHOS 4.5  --  4.5  --   --  3.9  --   --   --    < > = values in this interval not displayed.   GFR: Estimated Creatinine Clearance: 88 mL/min (by C-G formula based on SCr of 0.48 mg/dL). Liver Function Tests: Recent Labs  Lab 11/17/19 0622 11/18/19 0326 11/20/19 1316 11/21/19 0300 11/22/19 0524  AST 16 19 50* 34 45*  ALT 11 13 38 35 44  ALKPHOS 92 103 146* 137* 104  BILITOT 0.3 0.5 0.8 0.9 0.9  PROT 6.5 6.9 7.0 6.8 5.8*  ALBUMIN 3.0* 3.1* 3.2* 3.3* 2.7*   No results for input(s): LIPASE, AMYLASE in the last 168 hours. No results for input(s): AMMONIA in the last 168 hours. Coagulation Profile: No results for input(s): INR, PROTIME in the last 168 hours. Cardiac Enzymes: No results for input(s): CKTOTAL, CKMB, CKMBINDEX, TROPONINI in the last 168 hours. BNP (last 3 results) No results for input(s): PROBNP in the last  8760 hours. HbA1C: No results for input(s): HGBA1C in the last 72 hours. CBG: Recent Labs  Lab 11/16/19 1801 11/16/19 2322 11/17/19 0558 11/17/19 1202  GLUCAP 119* 138* 104* 111*   Lipid Profile: No results for input(s): CHOL, HDL, LDLCALC, TRIG, CHOLHDL, LDLDIRECT in the last 72 hours. Thyroid Function Tests: No results for input(s): TSH, T4TOTAL, FREET4, T3FREE, THYROIDAB in the last 72 hours. Anemia Panel: No results for input(s): VITAMINB12, FOLATE, FERRITIN, TIBC, IRON, RETICCTPCT in the last 72 hours. Urine analysis:    Component Value Date/Time   COLORURINE YELLOW 10/24/2019 1822   APPEARANCEUR TURBID (A) 10/24/2019 1822   LABSPEC 1.013 10/24/2019 1822   PHURINE 5.0 10/24/2019 1822   GLUCOSEU NEGATIVE 10/24/2019 1822   HGBUR MODERATE (A) 10/24/2019 1822   BILIRUBINUR NEGATIVE 10/24/2019 1822   KETONESUR 5 (A) 10/24/2019 1822   PROTEINUR 100 (A) 10/24/2019 1822   UROBILINOGEN 0.2 08/23/2006 1144   NITRITE NEGATIVE 10/24/2019 1822   LEUKOCYTESUR LARGE (A) 10/24/2019 1822   Sepsis Labs: @LABRCNTIP (procalcitonin:4,lacticidven:4)  )No results found for this or any previous visit (from the past 240 hour(s)).    Studies: No results found.  Scheduled Meds: . Chlorhexidine Gluconate Cloth  6 each Topical Daily  . enoxaparin (LOVENOX) injection  40 mg Subcutaneous Q24H  . menthol-cetylpyridinium  1 lozenge Oral Q2H while awake  . metoprolol tartrate  5 mg Intravenous Q8H  . nicotine  7 mg Transdermal Daily  . pantoprazole (PROTONIX) IV  40 mg Intravenous Q24H  . psyllium  1 packet Oral Daily  . sodium chloride flush  10-40 mL Intracatheter Q12H    Continuous Infusions: . methocarbamol (ROBAXIN) IV 1,000 mg (11/23/19 1357)  . piperacillin-tazobactam (ZOSYN)  IV Stopped (11/23/19 1318)  . TPN ADULT (ION) 85 mL/hr at 11/22/19 1721  . TPN ADULT (ION)       LOS: 30 days     01/22/20  Christel Mormon, MD [facility] Pager [phone]  If 7PM-7AM, please contact  night-coverage [email or web address] Password [facility] 11/23/2019, 3:58 PM

## 2019-11-23 NOTE — Progress Notes (Signed)
PHARMACY - TOTAL PARENTERAL NUTRITION CONSULT NOTE   Indication: Prolonged ileus, SBO  Patient Measurements: Height: 5\' 8"  (172.7 cm) Weight: 81.6 kg (180 lb) IBW/kg (Calculated) : 63.9 TPN AdjBW (KG): 68.3 Body mass index is 27.37 kg/m.  Assessment: Patient is a 54 y.o F who was instructed by her gastroenterologist to come to the ED on 9/2 for treatment of intra-abdominal abscesses.  She underwent flex sigmoidoscopy on 9/8 with biopsies taken and noted diverticular disease.  She subsequently underwent lap diverting loop ileostomy on 9/13.  Abdominal CT on 9/22 showed SBO.  Pharmacy is consulted on 9/23 to start TPN.  Glucose / Insulin: CBGs were controlled, now d/c. Serum glucose is controlled at < 180.  Electrolytes: Mag 1.8 below goal > 2 (despite replacement), K = 4 Renal: Scr, BUN wnl LFTs / TGs: LFTs wnl, TG 163 (9/27) Prealbumin / albumin: albumin 2.7; pre-albumin inc to 20 (9/27) Intake / Output; MIVF:  - NGT stopped overnight 9/29-9/30, NGT removed during surgery 9/30. - Colostomy output decreased to 50 mL/24 hrs; Drain output 170 mL - net I/O inaccurate, unmeasured UOP x9 - D5 0.45%NaCl + KCl 20 mEq/L at 50 ml/hr d/c - 9/1 abd CT: Enlarging cystic, fluid containing area in the RIGHT adnexa may represent worsening of abscess adjacent to or involving the ovary. 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 a centrally located abscess within the pelvis which has demonstrated interval increase in size. - 9/14 pelvis MRI: Suspicious of abscess or fistula related to prior tip appendicitis or alternately diverticulitis.  The presence of air and fluid in the vagina is again highly suspicious for vaginal fistula. - 9/22 abd CT:  positive for small bowel obstruction.  Decrease in size of gas and fluid collection.  Similar appearance of inflammatory changes of the sigmoid colon with small fistula tract.  Surgeries / Procedures:  - 9/8 flex sigmoidoscopy: biopsies taken and noted diverticular disease - 9/13: lap diverting loop ileostomy - 9/30: Laparoscopy, enterolysis and opening of pelvic abscess, sigmoidoscopy, appendectomy, Cystoscopy, bilateral open ended catheter placement  Central access: PICC placed 9/23 TPN start date: 11/14/2019  Nutritional Goals (per RD recommendation on 9/30): kCal: 1900-2100, Protein: 95-105, Fluid: 2L/day Goal TPN rate is 85 mL/hr (provides 100 g of protein and 2020 kcals per day)  Current Nutrition: Clear liquid diet (start 9/30 > ) no tolerance so far  Plan:  Now: - Magnesium 2g IV bolus, KCl 10 meq iv x 2 At 18:00 - Continue TPN at goal rate of 85 ml/hr  - Electrolytes in TPN: 75mEq/L of Na, 41mEq/L of K, 30mEq/L of Ca, 10 mEq/L of Mg, and 41mmol/L of Phos. Cl:Ac ratio 1:1 - Standard MVI and trace elements to TPN - d/c SSI/CBG checks due to glucose within goal, stable - no MIVF - Monitor TPN labs on Mon/Thurs, BMET/Mag on Saturday - BMET , mg, and phos in AM -Follow up enteral intake   Saturday PharmD, BCPS Clinical Pharmacist [facility] main pharmacy [phone] 11/23/2019 10:30 AM

## 2019-11-24 LAB — CBC
HCT: 26.2 % — ABNORMAL LOW (ref 36.0–46.0)
Hemoglobin: 8.3 g/dL — ABNORMAL LOW (ref 12.0–15.0)
MCH: 30.6 pg (ref 26.0–34.0)
MCHC: 31.7 g/dL (ref 30.0–36.0)
MCV: 96.7 fL (ref 80.0–100.0)
Platelets: 369 10*3/uL (ref 150–400)
RBC: 2.71 MIL/uL — ABNORMAL LOW (ref 3.87–5.11)
RDW: 14.4 % (ref 11.5–15.5)
WBC: 5.6 10*3/uL (ref 4.0–10.5)
nRBC: 0 % (ref 0.0–0.2)

## 2019-11-24 LAB — BASIC METABOLIC PANEL
Anion gap: 10 (ref 5–15)
BUN: 12 mg/dL (ref 6–20)
CO2: 25 mmol/L (ref 22–32)
Calcium: 9.6 mg/dL (ref 8.9–10.3)
Chloride: 100 mmol/L (ref 98–111)
Creatinine, Ser: 0.48 mg/dL (ref 0.44–1.00)
GFR calc Af Amer: >60 mL/min (ref >60)
GFR calc non Af Amer: >60 mL/min (ref >60)
Glucose, Bld: 168 mg/dL — ABNORMAL HIGH (ref 70–99)
Potassium: 3.9 mmol/L (ref 3.5–5.1)
Sodium: 135 mmol/L (ref 135–145)

## 2019-11-24 LAB — PHOSPHORUS: Phosphorus: 4.4 mg/dL (ref 2.5–4.6)

## 2019-11-24 LAB — MAGNESIUM: Magnesium: 1.7 mg/dL (ref 1.7–2.4)

## 2019-11-24 MED ORDER — LORAZEPAM 2 MG/ML IJ SOLN
0.5000 mg | INTRAMUSCULAR | Status: DC | PRN
  Administered 2019-11-24 – 2019-11-27 (×10): 1 mg via INTRAVENOUS
  Filled 2019-11-24 (×10): qty 1

## 2019-11-24 MED ORDER — GABAPENTIN 100 MG PO CAPS
200.0000 mg | ORAL_CAPSULE | Freq: Three times a day (TID) | ORAL | Status: DC
  Administered 2019-11-24 – 2019-11-27 (×8): 200 mg via ORAL
  Filled 2019-11-24 (×9): qty 2

## 2019-11-24 MED ORDER — GABAPENTIN 300 MG PO CAPS: 300.0000 mg | ORAL_CAPSULE | Freq: Two times a day (BID) | ORAL | Status: DC

## 2019-11-24 MED ORDER — LOPERAMIDE HCL 2 MG PO CAPS: 2.0000 mg | ORAL_CAPSULE | Freq: Three times a day (TID) | ORAL | Status: DC | PRN

## 2019-11-24 MED ORDER — ASPIRIN EC 81 MG PO TBEC
81.0000 mg | DELAYED_RELEASE_TABLET | Freq: Every day | ORAL | Status: DC
  Administered 2019-11-24 – 2019-11-27 (×4): 81 mg via ORAL
  Filled 2019-11-24 (×4): qty 1

## 2019-11-24 MED ORDER — PSYLLIUM 95 % PO PACK
1.0000 | PACK | Freq: Two times a day (BID) | ORAL | Status: DC
  Administered 2019-11-24 – 2019-11-26 (×4): 1 via ORAL
  Filled 2019-11-24 (×7): qty 1

## 2019-11-24 MED ORDER — OXYCODONE HCL 5 MG PO TABS
10.0000 mg | ORAL_TABLET | ORAL | Status: DC | PRN
  Administered 2019-11-24 – 2019-11-27 (×12): 15 mg via ORAL
  Filled 2019-11-24 (×12): qty 3

## 2019-11-24 MED ORDER — MAGNESIUM SULFATE 4 GM/100ML IV SOLN
4.0000 g | Freq: Once | INTRAVENOUS | Status: AC
  Administered 2019-11-24: 4 g via INTRAVENOUS
  Filled 2019-11-24: qty 100

## 2019-11-24 MED ORDER — ADULT MULTIVITAMIN W/MINERALS CH: 1.0000 | ORAL_TABLET | Freq: Every day | ORAL | Status: DC

## 2019-11-24 MED ORDER — FERROUS SULFATE 325 (65 FE) MG PO TABS
325.0000 mg | ORAL_TABLET | Freq: Two times a day (BID) | ORAL | Status: DC
  Administered 2019-11-24 – 2019-11-27 (×6): 325 mg via ORAL
  Filled 2019-11-24 (×6): qty 1

## 2019-11-24 MED ORDER — TRAVASOL 10 % IV SOLN
INTRAVENOUS | Status: AC
  Filled 2019-11-24: qty 999.6

## 2019-11-24 MED ORDER — ACETAMINOPHEN 500 MG PO TABS
1000.0000 mg | ORAL_TABLET | Freq: Three times a day (TID) | ORAL | Status: DC
  Administered 2019-11-24 – 2019-11-27 (×9): 1000 mg via ORAL
  Filled 2019-11-24 (×9): qty 2

## 2019-11-24 MED ORDER — OXYCODONE HCL 5 MG PO TABS
5.0000 mg | ORAL_TABLET | Freq: Once | ORAL | Status: AC
  Administered 2019-11-24: 5 mg via ORAL
  Filled 2019-11-24: qty 1

## 2019-11-24 NOTE — Plan of Care (Signed)
  Problem: Clinical Measurements: Goal: Will remain free from infection Outcome: Progressing   Problem: Clinical Measurements: Goal: Diagnostic test results will improve Outcome: Progressing   Problem: Nutrition: Goal: Adequate nutrition will be maintained Outcome: Progressing   

## 2019-11-24 NOTE — Plan of Care (Signed)
  Problem: Health Behavior/Discharge Planning: Goal: Ability to manage health-related needs will improve Outcome: Progressing   Problem: Coping: Goal: Coping ability will improve Outcome: Progressing

## 2019-11-24 NOTE — Progress Notes (Signed)
PROGRESS NOTE  Susan Todd:[id number] DOB: 02/23/1965 DOA: 10/24/2019 PCP: Georgann Housekeeper, MD  HPI/Recap of past 21 hours: 54 year old lady prior history of hypertension, hyperlipidemia, chronic anxiety, depression, seizure disorder, COPD presents on 9/2 with worsening abdominal pain.  CT of the abdomen pelvis showed several abscesses within the abdomen along with colovaginal fistula.  she was started on broad-spectrum IV antibiotics.  GI and general surgery were consulted.  Patient underwent flex sigmoidoscopy on 10/30/2019 showing diverticulitis in the rectosigmoid and sigmoid colon along with stenosis and narrowing.  GI recommended outpatient follow-up with colonoscopy.  Repeat CT of the abdomen pelvis on 9/9 showed loculated abscess within the pelvis increased in size and not amenable to percutaneous drainage given its location.  She underwent diagnostic laparotomy with laparoscopic diverting loop ileostomy on 11/04/2019.  MRI of the pelvis on 11/05/2019 demonstrated abscess/fistula cavity in the central pelvis containing air and fluid interspersed between the mid sigmoid colon and rectum measuring 3.7 into 3.1 cm suspicious for abscess.   On 9/22 CT abd and pelvis with contrast Shows small bowel obstruction, with transition point  identified within the right lower quadrant of the abdomen in the area of the inflamed appendix. She was made NPO and Ng tube was placed. TPN was started for nutrition.  She had brown fluid aspirated from the NG tube. Abdominal x-ray on 9/27 recommends advancing the NG tube 3 cm for better output.  Unfortunately the obstruction is proximal to her ileostomy. She will probably need repeat imaging with a CT vs laparotomy .   Post laparoscopy, enterolysis and opening of pelvic abscess, sigmoidoscopy, appendectomy on 11/21/2019 by Dr. Daphine Deutscher and Dr. Cliffton Asters.  Started on PCA pump for pain control.  11/24/19: Seen and examined.  Mostly pain in her abdomen intermittently.  Tolerating  a soft diet.  On IV dilaudid intermittently PRN and weaning off.  Assessment/Plan: Principal Problem:   Intra-abdominal abscess (HCC) Active Problems:   Depression   Anxiety   Seizures (HCC)   COPD (chronic obstructive pulmonary disease) (HCC)   Seizure (HCC)  Sepsis, resolved, secondary to intra-abdominal/pelvic abscess from diverticulitis with colovaginal fistula Initial CT of the abdomen pelvis and MRI pelvis revealed multiple abscesses.  Not amenable to IR drainage given its location. S/p laparotomy with laparoscopic diverting loop ileostomy. Patient has been on IV Zosyn from 10/24/2019 till 11/13/2019, general surgery wants to continue the IV antibiotics for persistent abscess. CT of the abdomen and pelvis on 11/13/19 showing small bowel obstruction with transition point in the right lower quadrant at the site of the appendix.  NG tube was placed , connected to intermittent suction. TPN started for nutrition .   NG tube discontinued.  Post laparoscopy, enterolysis and opening of pelvic abscess, sigmoidoscopy, appendectomy on 11/21/2019 by Dr. Daphine Deutscher and Dr. Cliffton Asters. We appreciate general surgery's assistance.  Resolved small bowel obstruction Management as per above Goal potassium greater than 4.0, goal magnesium greater than 2.0. Continue to monitor electrolytes and replete as indicated.  Anxiety and depression. Stable Ativan to 1 mg every 4 hours prn.   COPD:  Stable No wheezing heard.   Continue with bronchodilators as needed  Resolved hypokalemia post repletion Serum potassium 4.4 Repeat not indicated  Resolved post repletion hypomagnesemia Serum magnesium 1.7 Replete as indicated.  Hypophosphatemia:  Replaced  Seizures:  None this admission.  Not on specific home AEDs  GERD On PPI IV daily.   Anemia of chronic disease/acute blood loss anemia post surgery. Drop in hemoglobin 8.0 from  8.4 from 9.9 Continue to monitor H&H  Essential hypertension BP is at  goal. On IV Lopressor 5 mg 3 times daily. Continue to monitor vital signs    DVT prophylaxis: lovenox SQ daily.  Code Status: full code.  Family Communication: (none at bedside)    Consultants:   General surgery  Procedures:  9/8-Flex sigmoidoscopy revealed diverticulosis in the rectosigmoid and sigmoid colon along with the stenosis and narrowing. Biopsies from colon and rectum negative for inflammation or malignancy 9/13-diagnostic laparotomy with laparoscopic diverting loop ileostomy  11/21/2019-Laparoscopy, enterolysis and opening of pelvic abscess, sigmoidoscopy, appendectomy on 11/21/2019 by Dr. Daphine Deutscher and Dr. Cliffton Asters.        Status is: Inpatient    Dispo: The patient is from: Home              Anticipated d/c is IR:WERX with home health services.               Anticipated d/c date is: 11/27/2019.              Patient currently no stable for discharge due to management of intra-abdominal abscesses, SBO, severe protein calorie malnutrition, on TPN.       Objective: Vitals:   11/23/19 1450 11/23/19 2147 11/24/19 0540 11/24/19 1405  BP: 120/74 128/83 124/70 124/75  Pulse: 96 93 85 78  Resp: 16 16  18   Temp: 98.2 F (36.8 C) 98.7 F (37.1 C) 98.8 F (37.1 C) 97.7 F (36.5 C)  TempSrc: Oral   Oral  SpO2: 100% 100% 100% 100%  Weight:      Height:        Intake/Output Summary (Last 24 hours) at 11/24/2019 1548 Last data filed at 11/24/2019 1411 Gross per 24 hour  Intake 3388.18 ml  Output 550 ml  Net 2838.18 ml   Filed Weights   11/19/19 1022 11/21/19 0726 11/21/19 1303  Weight: 83.2 kg 80.2 kg 81.6 kg    Exam: Exam unchanged from prior exam  . General: 54 y.o. year-old female pleasant well-developed well-nourished no acute distress.  Alert and oriented x3.   . Cardiovascular: Regular rate and rhythm no rubs or gallops.   57 Respiratory: Clear consultation no wheezes or rales.   . Abdomen: Soft bowel sounds present.  Ileostomy bag in place.    . Musculoskeletal: No lower extremity edema bilaterally.   Marland Kitchen Psychiatry: Mood is appropriate for condition and setting.  Data Reviewed: CBC: Recent Labs  Lab 11/18/19 0326 11/18/19 0326 11/20/19 1316 11/20/19 1316 11/21/19 0853 11/21/19 1815 11/22/19 0524 11/23/19 1115 11/24/19 0320  WBC 5.6   < > 8.6   < > 6.8 6.3 8.3 6.5 5.6  NEUTROABS 3.4  --  6.3  --   --   --  6.4  --   --   HGB 9.3*   < > 9.6*   < > 10.5* 9.9* 8.4* 8.0* 8.3*  HCT 30.6*   < > 30.9*   < > 32.7* 31.4* 26.5* 26.5* 26.2*  MCV 99.0   < > 95.1   < > 95.3 97.5 95.7 97.8 96.7  PLT 367   < > 350   < > 392 297 343 357 369   < > = values in this interval not displayed.   Basic Metabolic Panel: Recent Labs  Lab 11/18/19 0326 11/18/19 0326 11/20/19 1316 11/20/19 1316 11/21/19 0300 11/21/19 1815 11/22/19 0524 11/23/19 0351 11/24/19 0942  NA 138   < > 136  --  135  --  135 137 135  K 4.0   < > 4.1  --  3.7  --  4.4 4.0 3.9  CL 104   < > 102  --  103  --  104 105 100  CO2 24   < > 22  --  23  --  22 25 25   GLUCOSE 107*   < > 131*  --  124*  --  164* 112* 168*  BUN 17   < > 19  --  15  --  14 12 12   CREATININE 0.60   < > 0.57   < > 0.47 0.52 0.51 0.48 0.48  CALCIUM 9.6   < > 9.8  --  9.6  --  8.8* 9.2 9.6  MG 1.7  --   --   --  1.8  --  1.7 1.8 1.7  PHOS 4.5  --   --   --  3.9  --   --   --  4.4   < > = values in this interval not displayed.   GFR: Estimated Creatinine Clearance: 88 mL/min (by C-G formula based on SCr of 0.48 mg/dL). Liver Function Tests: Recent Labs  Lab 11/18/19 0326 11/20/19 1316 11/21/19 0300 11/22/19 0524  AST 19 50* 34 45*  ALT 13 38 35 44  ALKPHOS 103 146* 137* 104  BILITOT 0.5 0.8 0.9 0.9  PROT 6.9 7.0 6.8 5.8*  ALBUMIN 3.1* 3.2* 3.3* 2.7*   No results for input(s): LIPASE, AMYLASE in the last 168 hours. No results for input(s): AMMONIA in the last 168 hours. Coagulation Profile: No results for input(s): INR, PROTIME in the last 168 hours. Cardiac Enzymes: No results  for input(s): CKTOTAL, CKMB, CKMBINDEX, TROPONINI in the last 168 hours. BNP (last 3 results) No results for input(s): PROBNP in the last 8760 hours. HbA1C: No results for input(s): HGBA1C in the last 72 hours. CBG: No results for input(s): GLUCAP in the last 168 hours. Lipid Profile: No results for input(s): CHOL, HDL, LDLCALC, TRIG, CHOLHDL, LDLDIRECT in the last 72 hours. Thyroid Function Tests: No results for input(s): TSH, T4TOTAL, FREET4, T3FREE, THYROIDAB in the last 72 hours. Anemia Panel: No results for input(s): VITAMINB12, FOLATE, FERRITIN, TIBC, IRON, RETICCTPCT in the last 72 hours. Urine analysis:    Component Value Date/Time   COLORURINE YELLOW 10/24/2019 1822   APPEARANCEUR TURBID (A) 10/24/2019 1822   LABSPEC 1.013 10/24/2019 1822   PHURINE 5.0 10/24/2019 1822   GLUCOSEU NEGATIVE 10/24/2019 1822   HGBUR MODERATE (A) 10/24/2019 1822   BILIRUBINUR NEGATIVE 10/24/2019 1822   KETONESUR 5 (A) 10/24/2019 1822   PROTEINUR 100 (A) 10/24/2019 1822   UROBILINOGEN 0.2 08/23/2006 1144   NITRITE NEGATIVE 10/24/2019 1822   LEUKOCYTESUR LARGE (A) 10/24/2019 1822   Sepsis Labs: @LABRCNTIP (procalcitonin:4,lacticidven:4)  )No results found for this or any previous visit (from the past 240 hour(s)).    Studies: No results found.  Scheduled Meds: . acetaminophen  1,000 mg Oral TID  . aspirin EC  81 mg Oral Daily  . Chlorhexidine Gluconate Cloth  6 each Topical Daily  . enoxaparin (LOVENOX) injection  40 mg Subcutaneous Q24H  . ferrous sulfate  325 mg Oral BID WC  . gabapentin  200 mg Oral TID  . menthol-cetylpyridinium  1 lozenge Oral Q2H while awake  . metoprolol tartrate  5 mg Intravenous Q8H  . nicotine  7 mg Transdermal Daily  . pantoprazole (PROTONIX) IV  40 mg Intravenous Q24H  . psyllium  1 packet  Oral BID  . sodium chloride flush  10-40 mL Intracatheter Q12H    Continuous Infusions: . methocarbamol (ROBAXIN) IV 1,000 mg (11/24/19 1351)  .  piperacillin-tazobactam (ZOSYN)  IV 3.375 g (11/24/19 1547)  . TPN ADULT (ION) 85 mL/hr at 11/23/19 1728  . TPN ADULT (ION)       LOS: 31 days     Darlin Droparole N Aryani Daffern, MD [facility] Pager [phone]  If 7PM-7AM, please contact night-coverage [email or web address] Password [email or web address] 11/24/2019, 3:48 PM

## 2019-11-24 NOTE — Progress Notes (Signed)
PHARMACY - TOTAL PARENTERAL NUTRITION CONSULT NOTE   Indication: Prolonged ileus, SBO  Patient Measurements: Height: 5\' 8"  (172.7 cm) Weight: 81.6 kg (180 lb) IBW/kg (Calculated) : 63.9 TPN AdjBW (KG): 68.3 Body mass index is 27.37 kg/m.  Assessment: Patient is a 54 y.o F who was instructed by her gastroenterologist to come to the ED on 9/2 for treatment of intra-abdominal abscesses.  She underwent flex sigmoidoscopy on 9/8 with biopsies taken and noted diverticular disease.  She subsequently underwent lap diverting loop ileostomy on 9/13.  Abdominal CT on 9/22 showed SBO.  Pharmacy is consulted on 9/23 to start TPN.  Glucose / Insulin: CBGs were controlled, now d/c. Serum glucose is controlled at < 180.  Electrolytes: Mag 1.7 below goal > 2 (actulally lower despite replacement), K = 3.9, corrected calcium= 10.7 Renal: Scr, BUN wnl LFTs / TGs: LFTs wnl (10/1), TG 163 (9/27) Prealbumin / albumin: albumin 2.7 (10/1); pre-albumin inc to 20 (9/27) Intake / Output; MIVF:  - NGT stopped overnight 9/29-9/30, NGT removed during surgery 9/30. - Colostomy output decreased to 50 mL/24 hrs; Drain output 170 mL - net I/O inaccurate, unmeasured UOP x9 - D5 0.45%NaCl + KCl 20 mEq/L at 50 ml/hr d/c - 9/1 abd CT: Enlarging cystic, fluid containing area in the RIGHT adnexa may represent worsening of abscess adjacent to or involving the ovary. 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 a centrally located abscess within the pelvis which has demonstrated interval increase in size. - 9/14 pelvis MRI: Suspicious of abscess or fistula related to prior tip appendicitis or alternately diverticulitis.  The presence of air and fluid in the vagina is again  highly suspicious for vaginal fistula. - 9/22 abd CT: positive for small bowel obstruction.  Decrease in size of gas and fluid collection.  Similar appearance of inflammatory changes of the sigmoid colon with small fistula tract.  Surgeries / Procedures:  - 9/8 flex sigmoidoscopy: biopsies taken and noted diverticular disease - 9/13: lap diverting loop ileostomy - 9/30: Laparoscopy, enterolysis and opening of pelvic abscess, sigmoidoscopy, appendectomy, Cystoscopy, bilateral open ended catheter placement  Central access: PICC placed 9/23 TPN start date: 11/14/2019  Nutritional Goals (per RD recommendation on 9/30): kCal: 1900-2100, Protein: 95-105, Fluid: 2L/day Goal TPN rate is 85 mL/hr (provides 100 g of protein and 2020 kcals per day)  Current Nutrition: FLD + TPJN  Plan:  Now: - Magnesium 4g IV bolus At 18:00 - Continue TPN at goal rate of 85 ml/hr  - Reduce calcium in TPN by 50% - Electrolytes in TPN: 65mEq/L of Na, 68mEq/L of K, 2.71mEq/L of Ca, 10 mEq/L of Mg, and 53mmol/L of Phos. Cl:Ac ratio 1:1 - Standard MVI and trace elements to TPN - D/C po MVI - d/c SSI/CBG checks due to glucose within goal, stable - no MIVF - Monitor TPN labs on Mon/Thurs -Follow up enteral intake   12m, PharmD, BCPS [phone] 11/24/2019 9:38 AM

## 2019-11-24 NOTE — Progress Notes (Signed)
3 Days Post-Op   Subjective/Chief Complaint: Pt with some lower abd pain Ostomy working Had BM rectally.    Objective: Vital signs in last 24 hours: Temp:  [98.2 F (36.8 C)-98.8 F (37.1 C)] 98.8 F (37.1 C) (10/03 0540) Pulse Rate:  [85-96] 85 (10/03 0540) Resp:  [16-17] 16 (10/02 2147) BP: (120-128)/(70-83) 124/70 (10/03 0540) SpO2:  [98 %-100 %] 100 % (10/03 0540) Last BM Date: 11/23/19  Intake/Output from previous day: 10/02 0701 - 10/03 0700 In: 2469.6 [P.O.:1080; I.V.:770.4; IV Piggyback:619.2] Out: 145 [Drains:45; Stool:100] Intake/Output this shift: No intake/output data recorded.  PE:  Constitutional: No acute distress, conversant, appears states age. Eyes: Anicteric sclerae, moist conjunctiva, no lid lag Lungs: Clear to auscultation bilaterally, normal respiratory effort CV: regular rate and rhythm, no murmurs, no peripheral edema, pedal pulses 2+ GI: Soft, no masses or hepatosplenomegaly, non-tender to palpation, ostomy patent, Dr-SS Skin: No rashes, palpation reveals normal turgor Psychiatric: appropriate judgment and insight, oriented to person, place, and time   Lab Results:  Recent Labs    11/23/19 1115 11/24/19 0320  WBC 6.5 5.6  HGB 8.0* 8.3*  HCT 26.5* 26.2*  PLT 357 369   BMET Recent Labs    11/22/19 0524 11/23/19 0351  NA 135 137  K 4.4 4.0  CL 104 105  CO2 22 25  GLUCOSE 164* 112*  BUN 14 12  CREATININE 0.51 0.48  CALCIUM 8.8* 9.2   Anti-infectives: Anti-infectives (From admission, onward)   Start     Dose/Rate Route Frequency Ordered Stop   11/21/19 1328  piperacillin-tazobactam (ZOSYN) 3.375 GM/50ML IVPB       Note to Pharmacy: Nadene Rubins   : cabinet override      11/21/19 1328 11/21/19 1336   11/14/19 1600  piperacillin-tazobactam (ZOSYN) IVPB 3.375 g        3.375 g 12.5 mL/hr over 240 Minutes Intravenous Every 8 hours 11/14/19 1514     11/03/19 0845  cefoTEtan (CEFOTAN) 2 g in sodium chloride 0.9 % 100 mL IVPB   Status:  Discontinued        2 g 200 mL/hr over 30 Minutes Intravenous On call to O.R. 11/03/19 0831 11/03/19 0842   10/25/19 1000  vancomycin (VANCOCIN) IVPB 1000 mg/200 mL premix  Status:  Discontinued        1,000 mg 200 mL/hr over 60 Minutes Intravenous Every 12 hours 10/24/19 1835 10/28/19 1727   10/24/19 2300  piperacillin-tazobactam (ZOSYN) IVPB 3.375 g  Status:  Discontinued        3.375 g 12.5 mL/hr over 240 Minutes Intravenous Every 8 hours 10/24/19 1835 11/13/19 1557   10/24/19 2000  vancomycin (VANCOREADY) IVPB 1500 mg/300 mL  Status:  Discontinued        1,500 mg 150 mL/hr over 120 Minutes Intravenous  Once 10/24/19 1835 10/24/19 1836   10/24/19 2000  vancomycin (VANCOREADY) IVPB 1500 mg/300 mL        1,500 mg 150 mL/hr over 120 Minutes Intravenous  Once 10/24/19 1836 10/24/19 2229   10/24/19 1730  piperacillin-tazobactam (ZOSYN) IVPB 3.375 g        3.375 g 100 mL/hr over 30 Minutes Intravenous  Once 10/24/19 1721 10/24/19 1810      Assessment/Plan: COPD History of seizures Anxiety/depression Anemia-Hgb stable Hypokalemia-resolved   POD20, 3, s/pLAPAROSCOPIC diverting loop ileostomy- 11/04/2019 - [facility] complicated diverticulitis, 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 Burley Saver 11/21/19 -Con't with FLD for now, ostomy appears to be  working well -mobilize -1 more days of zosyn and discontinued since abscess has now been drained -cont TNA until able to take in oral diet -Oral pain control, DC PCA, ice pack to inciscion   LOS: 31 days    [name] 11/24/2019

## 2019-11-25 LAB — CBC
HCT: 26 % — ABNORMAL LOW (ref 36.0–46.0)
Hemoglobin: 8 g/dL — ABNORMAL LOW (ref 12.0–15.0)
MCH: 29.6 pg (ref 26.0–34.0)
MCHC: 30.8 g/dL (ref 30.0–36.0)
MCV: 96.3 fL (ref 80.0–100.0)
Platelets: 365 10*3/uL (ref 150–400)
RBC: 2.7 MIL/uL — ABNORMAL LOW (ref 3.87–5.11)
RDW: 14.6 % (ref 11.5–15.5)
WBC: 4.7 10*3/uL (ref 4.0–10.5)
nRBC: 0 % (ref 0.0–0.2)

## 2019-11-25 LAB — DIFFERENTIAL
Abs Immature Granulocytes: 0.02 10*3/uL (ref 0.00–0.07)
Basophils Absolute: 0 10*3/uL (ref 0.0–0.1)
Basophils Relative: 0 %
Eosinophils Absolute: 0.4 10*3/uL (ref 0.0–0.5)
Eosinophils Relative: 9 %
Immature Granulocytes: 0 %
Lymphocytes Relative: 30 %
Lymphs Abs: 1.4 10*3/uL (ref 0.7–4.0)
Monocytes Absolute: 0.6 10*3/uL (ref 0.1–1.0)
Monocytes Relative: 12 %
Neutro Abs: 2.2 10*3/uL (ref 1.7–7.7)
Neutrophils Relative %: 49 %

## 2019-11-25 LAB — TRIGLYCERIDES: Triglycerides: 140 mg/dL (ref <150)

## 2019-11-25 LAB — MAGNESIUM: Magnesium: 1.9 mg/dL (ref 1.7–2.4)

## 2019-11-25 LAB — COMPREHENSIVE METABOLIC PANEL
ALT: 35 U/L (ref 0–44)
AST: 27 U/L (ref 15–41)
Albumin: 2.8 g/dL — ABNORMAL LOW (ref 3.5–5.0)
Alkaline Phosphatase: 101 U/L (ref 38–126)
Anion gap: 9 (ref 5–15)
BUN: 14 mg/dL (ref 6–20)
CO2: 24 mmol/L (ref 22–32)
Calcium: 9.4 mg/dL (ref 8.9–10.3)
Chloride: 106 mmol/L (ref 98–111)
Creatinine, Ser: 0.51 mg/dL (ref 0.44–1.00)
GFR calc Af Amer: >60 mL/min (ref >60)
GFR calc non Af Amer: >60 mL/min (ref >60)
Glucose, Bld: 131 mg/dL — ABNORMAL HIGH (ref 70–99)
Potassium: 3.9 mmol/L (ref 3.5–5.1)
Sodium: 139 mmol/L (ref 135–145)
Total Bilirubin: 0.8 mg/dL (ref 0.3–1.2)
Total Protein: 6 g/dL — ABNORMAL LOW (ref 6.5–8.1)

## 2019-11-25 LAB — PHOSPHORUS: Phosphorus: 4.4 mg/dL (ref 2.5–4.6)

## 2019-11-25 LAB — PREALBUMIN: Prealbumin: 19.6 mg/dL (ref 18–38)

## 2019-11-25 LAB — SURGICAL PATHOLOGY

## 2019-11-25 MED ORDER — TRAVASOL 10 % IV SOLN
INTRAVENOUS | Status: AC
  Filled 2019-11-25: qty 470.4

## 2019-11-25 MED ORDER — MAGNESIUM SULFATE 2 GM/50ML IV SOLN
2.0000 g | Freq: Once | INTRAVENOUS | Status: AC
  Administered 2019-11-25: 2 g via INTRAVENOUS
  Filled 2019-11-25: qty 50

## 2019-11-25 NOTE — Progress Notes (Signed)
4 Days Post-Op   Subjective/Chief Complaint: Seems to be feeling better. Tolerating diet   Objective: Vital signs in last 24 hours: Temp:  [97.7 F (36.5 C)-98.4 F (36.9 C)] 98.4 F (36.9 C) (10/04 0944) Pulse Rate:  [72-86] 86 (10/04 0944) Resp:  [16-18] 17 (10/04 0944) BP: (106-124)/(74-77) 119/77 (10/04 0944) SpO2:  [100 %] 100 % (10/04 0944) Last BM Date: 11/25/19  Intake/Output from previous day: 10/03 0701 - 10/04 0700 In: 4153 [P.O.:730; I.V.:2812; IV Piggyback:611] Out: 1785 [Urine:1665; Drains:20; Stool:100] Intake/Output this shift: Total I/O In: 360 [P.O.:360] Out: 375 [Urine:250; Drains:25; Stool:100]  General appearance: alert and cooperative Resp: clear to auscultation bilaterally Cardio: regular rate and rhythm GI: soft, diffusely tender. ostomy productive  Lab Results:  Recent Labs    11/24/19 0320 11/25/19 0315  WBC 5.6 4.7  HGB 8.3* 8.0*  HCT 26.2* 26.0*  PLT 369 365   BMET Recent Labs    11/24/19 0942 11/25/19 0315  NA 135 139  K 3.9 3.9  CL 100 106  CO2 25 24  GLUCOSE 168* 131*  BUN 12 14  CREATININE 0.48 0.51  CALCIUM 9.6 9.4   PT/INR No results for input(s): LABPROT, INR in the last 72 hours. ABG No results for input(s): PHART, HCO3 in the last 72 hours.  Invalid input(s): PCO2, PO2  Studies/Results: No results found.  Anti-infectives: Anti-infectives (From admission, onward)   Start     Dose/Rate Route Frequency Ordered Stop   11/21/19 1328  piperacillin-tazobactam (ZOSYN) 3.375 GM/50ML IVPB       Note to Pharmacy: Nadene Rubins   : cabinet override      11/21/19 1328 11/21/19 1336   11/14/19 1600  piperacillin-tazobactam (ZOSYN) IVPB 3.375 g        3.375 g 12.5 mL/hr over 240 Minutes Intravenous Every 8 hours 11/14/19 1514     11/03/19 0845  cefoTEtan (CEFOTAN) 2 g in sodium chloride 0.9 % 100 mL IVPB  Status:  Discontinued        2 g 200 mL/hr over 30 Minutes Intravenous On call to O.R. 11/03/19 0831 11/03/19 0842    10/25/19 1000  vancomycin (VANCOCIN) IVPB 1000 mg/200 mL premix  Status:  Discontinued        1,000 mg 200 mL/hr over 60 Minutes Intravenous Every 12 hours 10/24/19 1835 10/28/19 1727   10/24/19 2300  piperacillin-tazobactam (ZOSYN) IVPB 3.375 g  Status:  Discontinued        3.375 g 12.5 mL/hr over 240 Minutes Intravenous Every 8 hours 10/24/19 1835 11/13/19 1557   10/24/19 2000  vancomycin (VANCOREADY) IVPB 1500 mg/300 mL  Status:  Discontinued        1,500 mg 150 mL/hr over 120 Minutes Intravenous  Once 10/24/19 1835 10/24/19 1836   10/24/19 2000  vancomycin (VANCOREADY) IVPB 1500 mg/300 mL        1,500 mg 150 mL/hr over 120 Minutes Intravenous  Once 10/24/19 1836 10/24/19 2229   10/24/19 1730  piperacillin-tazobactam (ZOSYN) IVPB 3.375 g        3.375 g 100 mL/hr over 30 Minutes Intravenous  Once 10/24/19 1721 10/24/19 1810      Assessment/Plan: s/p Procedure(s): EXPLORATORY LAPAROTOMY & enterolysis (N/A) CYSTOSCOPY WITH STENT PLACEMENT (Bilateral) SIGMOIDOSCOPY APPENDECTOMY LAPAROSCOPIC Advance diet  COPD History of seizures Anxiety/depression Anemia-Hgb stable Hypokalemia-resolved   POD21,4, s/pLAPAROSCOPIC diverting loop ileostomy- 11/04/2019 - [facility] complicated diverticulitis, 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 Burley Saver 11/21/19 -Con't with FLD for now,  ostomy appears to be working well -mobilize - days of zosyn and discontinued since abscess has now been drained -cont TNA until able to take in oral diet -Oral pain control, DC PCA, ice pack to inciscion  LOS: 32 days    Susan Todd 11/25/2019

## 2019-11-25 NOTE — Progress Notes (Signed)
PROGRESS NOTE  Susan Todd YWV:[id number] DOB: 1965/06/08 DOA: 10/24/2019 PCP: Georgann Housekeeper, MD  HPI/Recap of past 9 hours: 54 year old lady prior history of hypertension, hyperlipidemia, chronic anxiety, depression, seizure disorder, COPD presents on 9/2 with worsening abdominal pain.  CT of the abdomen pelvis showed several abscesses within the abdomen along with colovaginal fistula.  she was started on broad-spectrum IV antibiotics.  GI and general surgery were consulted.  Patient underwent flex sigmoidoscopy on 10/30/2019 showing diverticulitis in the rectosigmoid and sigmoid colon along with stenosis and narrowing.  GI recommended outpatient follow-up with colonoscopy.  Repeat CT of the abdomen pelvis on 9/9 showed loculated abscess within the pelvis increased in size and not amenable to percutaneous drainage given its location.  She underwent diagnostic laparotomy with laparoscopic diverting loop ileostomy on 11/04/2019.  MRI of the pelvis on 11/05/2019 demonstrated abscess/fistula cavity in the central pelvis containing air and fluid interspersed between the mid sigmoid colon and rectum measuring 3.7 into 3.1 cm suspicious for abscess.   On 9/22 CT abd and pelvis with contrast Shows small bowel obstruction, with transition point  identified within the right lower quadrant of the abdomen in the area of the inflamed appendix. She was made NPO and Ng tube was placed. TPN was started for nutrition.  She had brown fluid aspirated from the NG tube. Abdominal x-ray on 9/27 recommends advancing the NG tube 3 cm for better output.  Unfortunately the obstruction is proximal to her ileostomy. She will probably need repeat imaging with a CT vs laparotomy .   Post laparoscopy, enterolysis and opening of pelvic abscess, sigmoidoscopy, appendectomy on 11/21/2019 by Dr. Daphine Deutscher and Dr. Cliffton Asters.  Started on PCA pump for pain control.  11/25/19: No acute events overnight.  Zosyn DCed since abscess has now been  drained.  Assessment/Plan: Principal Problem:   Intra-abdominal abscess (HCC) Active Problems:   Depression   Anxiety   Seizures (HCC)   COPD (chronic obstructive pulmonary disease) (HCC)   Seizure (HCC)  Sepsis, resolved, secondary to intra-abdominal/pelvic abscess from diverticulitis with colovaginal fistula Initial CT of the abdomen pelvis and MRI pelvis revealed multiple abscesses.  Not amenable to IR drainage given its location. S/p laparotomy with laparoscopic diverting loop ileostomy. Patient has been on IV Zosyn from 10/24/2019 till 11/13/2019, Zosyn Dced 11/24/19. CT of the abdomen and pelvis on 11/13/19 showing small bowel obstruction with transition point in the right lower quadrant at the site of the appendix.  NG tube was placed , connected to intermittent suction. TPN started for nutrition .   NG tube discontinued.  Post laparoscopy, enterolysis and opening of pelvic abscess, sigmoidoscopy, appendectomy on 11/21/2019 by Dr. Daphine Deutscher and Dr. Cliffton Asters. We appreciate general surgery's assistance.  Resolved small bowel obstruction Management as per above Goal potassium greater than 4.0, goal magnesium greater than 2.0. Continue to monitor electrolytes and replete as indicated.  Anxiety and depression. Stable Ativan to 1 mg every 4 hours prn.   COPD:  Stable No wheezing heard.   Continue with bronchodilators as needed  Resolved hypokalemia post repletion Serum potassium 4.4 Repeat not indicated  Resolved post repletion hypomagnesemia Serum magnesium 1.7 Replete as indicated.  Hypophosphatemia:  Replaced  Seizures:  None this admission.  Not on specific home AEDs  GERD On PPI IV daily.   Anemia of chronic disease/acute blood loss anemia post surgery. Drop in hemoglobin 8.0 from 8.4 from 9.9 Continue to monitor H&H  Essential hypertension BP is at goal. On IV Lopressor 5 mg  3 times daily. Continue to monitor vital signs    DVT prophylaxis: lovenox  SQ daily.  Code Status: full code.  Family Communication: (none at bedside)    Consultants:   General surgery  Procedures:  9/8-Flex sigmoidoscopy revealed diverticulosis in the rectosigmoid and sigmoid colon along with the stenosis and narrowing. Biopsies from colon and rectum negative for inflammation or malignancy 9/13-diagnostic laparotomy with laparoscopic diverting loop ileostomy  11/21/2019-Laparoscopy, enterolysis and opening of pelvic abscess, sigmoidoscopy, appendectomy on 11/21/2019 by Dr. Daphine Deutscher and Dr. Cliffton Asters.        Status is: Inpatient    Dispo: The patient is from: Home              Anticipated d/c is RK:YHCW with home health services.               Anticipated d/c date is: 11/27/2019.              Patient currently no stable for discharge due to management of intra-abdominal abscesses, SBO, severe protein calorie malnutrition, on TPN.       Objective: Vitals:   11/24/19 2208 11/25/19 0613 11/25/19 0944 11/25/19 1318  BP: 106/74 118/76 119/77 107/71  Pulse: 72 75 86 88  Resp: 16 18 17 16   Temp:  98.2 F (36.8 C) 98.4 F (36.9 C) 98.2 F (36.8 C)  TempSrc:  Oral Oral Oral  SpO2: 100% 100% 100% 99%  Weight:      Height:        Intake/Output Summary (Last 24 hours) at 11/25/2019 1503 Last data filed at 11/25/2019 1400 Gross per 24 hour  Intake 2126.71 ml  Output 1630 ml  Net 496.71 ml   Filed Weights   11/19/19 1022 11/21/19 0726 11/21/19 1303  Weight: 83.2 kg 80.2 kg 81.6 kg    Exam: Exam unchanged from prior exam  . General: 54 y.o. year-old female pleasant well-developed well-nourished no acute distress.  Alert and oriented x3.   . Cardiovascular: Regular rate and rhythm no rubs or gallops.   59 Respiratory: Clear consultation no wheezes or rales.   . Abdomen: Soft bowel sounds present.  Ileostomy bag in place.   . Musculoskeletal: No lower extremity edema bilaterally.   Marland Kitchen Psychiatry: Mood is appropriate for condition and  setting.  Data Reviewed: CBC: Recent Labs  Lab 11/20/19 1316 11/21/19 0853 11/21/19 1815 11/22/19 0524 11/23/19 1115 11/24/19 0320 11/25/19 0315  WBC 8.6   < > 6.3 8.3 6.5 5.6 4.7  NEUTROABS 6.3  --   --  6.4  --   --  2.2  HGB 9.6*   < > 9.9* 8.4* 8.0* 8.3* 8.0*  HCT 30.9*   < > 31.4* 26.5* 26.5* 26.2* 26.0*  MCV 95.1   < > 97.5 95.7 97.8 96.7 96.3  PLT 350   < > 297 343 357 369 365   < > = values in this interval not displayed.   Basic Metabolic Panel: Recent Labs  Lab 11/21/19 0300 11/21/19 0300 11/21/19 1815 11/22/19 0524 11/23/19 0351 11/24/19 0942 11/25/19 0315  NA 135  --   --  135 137 135 139  K 3.7  --   --  4.4 4.0 3.9 3.9  CL 103  --   --  104 105 100 106  CO2 23  --   --  22 25 25 24   GLUCOSE 124*  --   --  164* 112* 168* 131*  BUN 15  --   --  14  12 12 14   CREATININE 0.47   < > 0.52 0.51 0.48 0.48 0.51  CALCIUM 9.6  --   --  8.8* 9.2 9.6 9.4  MG 1.8  --   --  1.7 1.8 1.7 1.9  PHOS 3.9  --   --   --   --  4.4 4.4   < > = values in this interval not displayed.   GFR: Estimated Creatinine Clearance: 88 mL/min (by C-G formula based on SCr of 0.51 mg/dL). Liver Function Tests: Recent Labs  Lab 11/20/19 1316 11/21/19 0300 11/22/19 0524 11/25/19 0315  AST 50* 34 45* 27  ALT 38 35 44 35  ALKPHOS 146* 137* 104 101  BILITOT 0.8 0.9 0.9 0.8  PROT 7.0 6.8 5.8* 6.0*  ALBUMIN 3.2* 3.3* 2.7* 2.8*   No results for input(s): LIPASE, AMYLASE in the last 168 hours. No results for input(s): AMMONIA in the last 168 hours. Coagulation Profile: No results for input(s): INR, PROTIME in the last 168 hours. Cardiac Enzymes: No results for input(s): CKTOTAL, CKMB, CKMBINDEX, TROPONINI in the last 168 hours. BNP (last 3 results) No results for input(s): PROBNP in the last 8760 hours. HbA1C: No results for input(s): HGBA1C in the last 72 hours. CBG: No results for input(s): GLUCAP in the last 168 hours. Lipid Profile: Recent Labs    11/25/19 0315  TRIG 140    Thyroid Function Tests: No results for input(s): TSH, T4TOTAL, FREET4, T3FREE, THYROIDAB in the last 72 hours. Anemia Panel: No results for input(s): VITAMINB12, FOLATE, FERRITIN, TIBC, IRON, RETICCTPCT in the last 72 hours. Urine analysis:    Component Value Date/Time   COLORURINE YELLOW 10/24/2019 1822   APPEARANCEUR TURBID (A) 10/24/2019 1822   LABSPEC 1.013 10/24/2019 1822   PHURINE 5.0 10/24/2019 1822   GLUCOSEU NEGATIVE 10/24/2019 1822   HGBUR MODERATE (A) 10/24/2019 1822   BILIRUBINUR NEGATIVE 10/24/2019 1822   KETONESUR 5 (A) 10/24/2019 1822   PROTEINUR 100 (A) 10/24/2019 1822   UROBILINOGEN 0.2 08/23/2006 1144   NITRITE NEGATIVE 10/24/2019 1822   LEUKOCYTESUR LARGE (A) 10/24/2019 1822   Sepsis Labs: @LABRCNTIP (procalcitonin:4,lacticidven:4)  )No results found for this or any previous visit (from the past 240 hour(s)).    Studies: No results found.  Scheduled Meds: . acetaminophen  1,000 mg Oral TID  . aspirin EC  81 mg Oral Daily  . Chlorhexidine Gluconate Cloth  6 each Topical Daily  . enoxaparin (LOVENOX) injection  40 mg Subcutaneous Q24H  . ferrous sulfate  325 mg Oral BID WC  . gabapentin  200 mg Oral TID  . menthol-cetylpyridinium  1 lozenge Oral Q2H while awake  . metoprolol tartrate  5 mg Intravenous Q8H  . nicotine  7 mg Transdermal Daily  . pantoprazole (PROTONIX) IV  40 mg Intravenous Q24H  . psyllium  1 packet Oral BID  . sodium chloride flush  10-40 mL Intracatheter Q12H    Continuous Infusions: . magnesium sulfate bolus IVPB    . methocarbamol (ROBAXIN) IV 1,000 mg (11/25/19 1429)  . TPN ADULT (ION) 85 mL/hr at 11/24/19 1707  . TPN ADULT (ION)       LOS: 32 days     01/25/20, MD [facility] Pager [phone]  If 7PM-7AM, please contact night-coverage [email or web address] Password [email or web address] 11/25/2019, 3:03 PM

## 2019-11-25 NOTE — Progress Notes (Signed)
PHARMACY - TOTAL PARENTERAL NUTRITION CONSULT NOTE   Indication: Prolonged ileus, SBO  Patient Measurements: Height: 5\' 8"  (172.7 cm) Weight: 81.6 kg (180 lb) IBW/kg (Calculated) : 63.9 TPN AdjBW (KG): 68.3 Body mass index is 27.37 kg/m.  Assessment: Patient is a 54 y.o F who was instructed by her gastroenterologist to come to the ED on 9/2 for treatment of intra-abdominal abscesses.  She underwent flex sigmoidoscopy on 9/8 with biopsies taken and noted diverticular disease.  She subsequently underwent lap diverting loop ileostomy on 9/13.  Abdominal CT on 9/22 showed SBO.  Pharmacy is consulted on 9/23 to start TPN.  Glucose / Insulin: CBGs were controlled, now d/c. Serum glucose is controlled at < 180.  Electrolytes: K 3.9 (goal >4), Mag 1.9 (goal > 2) despite replacement. CorrCa 10.36.  Other WNL Renal: Scr, BUN wnl LFTs / TGs: LFTs wnl (10/4), TG 140 (10/4) Prealbumin / albumin: albumin 2.8, pre-albumin 19.6 (10/4) Intake / Output; MIVF:  - Colostomy output 100 mL/24 hrs; MD reports rectal BM as well - Drain output decreased to 20 mL - net I/O inaccurate, 1665 ml urine + unmeasured UOP x1 GI Imaging: - 9/1 abd CT: Enlarging cystic, fluid containing area in the RIGHT adnexa may represent worsening of abscess adjacent to or involving the ovary. 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 a centrally located abscess within the pelvis which has demonstrated interval increase in size. - 9/14 pelvis MRI: Suspicious of abscess or fistula related to prior tip appendicitis or alternately diverticulitis.  The presence of air and fluid in the vagina is again highly suspicious for vaginal fistula. - 9/22 abd CT: positive for small bowel  obstruction.  Decrease in size of gas and fluid collection.  Similar appearance of inflammatory changes of the sigmoid colon with small fistula tract.  Surgeries / Procedures:  - 9/8 flex sigmoidoscopy: biopsies taken and noted diverticular disease - 9/13: lap diverting loop ileostomy - 9/30: Laparoscopy, enterolysis and opening of pelvic abscess, sigmoidoscopy, appendectomy, Cystoscopy, bilateral open ended catheter placement  Central access: PICC placed 9/23 TPN start date: 11/14/2019  Nutritional Goals (per RD recommendation on 9/30): kCal: 1900-2100, Protein: 95-105, Fluid: 2L/day Goal TPN rate is 85 mL/hr (provides 100 g of protein and 2020 kcals per day)  Current Nutrition: FLD > Soft diet (10/3) + TPN  Plan:  Now: - Magnesium 2g IV bolus At 18:00 - Reduce to HALF rate TPN today, 40 ml/hr  - Electrolytes in TPN: 54mEq/L of Na, 28mEq/L of K, 2.57mEq/L of Ca, 10 mEq/L of Mg, and 71mmol/L of Phos. Cl:Ac ratio 1:1 - Standard MVI and trace elements in TPN today, change to PO if able to tolerate. - d/c SSI/CBG checks due to glucose within goal, stable - no MIVF - Monitor TPN labs on Mon/Thurs - Follow up enteral intake   12m PharmD, BCPS Clinical Pharmacist [facility] main pharmacy [phone] 11/25/2019 7:55 AM

## 2019-11-26 LAB — CBC
HCT: 25.5 % — ABNORMAL LOW (ref 36.0–46.0)
Hemoglobin: 7.9 g/dL — ABNORMAL LOW (ref 12.0–15.0)
MCH: 30.2 pg (ref 26.0–34.0)
MCHC: 31 g/dL (ref 30.0–36.0)
MCV: 97.3 fL (ref 80.0–100.0)
Platelets: 341 10*3/uL (ref 150–400)
RBC: 2.62 MIL/uL — ABNORMAL LOW (ref 3.87–5.11)
RDW: 14.6 % (ref 11.5–15.5)
WBC: 5.8 10*3/uL (ref 4.0–10.5)
nRBC: 0 % (ref 0.0–0.2)

## 2019-11-26 MED ORDER — PANTOPRAZOLE SODIUM 40 MG PO TBEC
40.0000 mg | DELAYED_RELEASE_TABLET | Freq: Every day | ORAL | Status: DC
  Administered 2019-11-26 – 2019-11-27 (×2): 40 mg via ORAL
  Filled 2019-11-26 (×2): qty 1

## 2019-11-26 MED ORDER — METHOCARBAMOL 500 MG PO TABS
1000.0000 mg | ORAL_TABLET | Freq: Three times a day (TID) | ORAL | Status: DC
  Administered 2019-11-26 – 2019-11-27 (×4): 1000 mg via ORAL
  Filled 2019-11-26 (×4): qty 2

## 2019-11-26 NOTE — Progress Notes (Signed)
PROGRESS NOTE  Susan Todd EXN:[id number] DOB: 05/20/1965 DOA: 10/24/2019 PCP: Georgann Housekeeper, MD  HPI/Recap of past 74 hours: 54 year old lady prior history of hypertension, hyperlipidemia, chronic anxiety, depression, seizure disorder, COPD presents on 9/2 with worsening abdominal pain.  CT of the abdomen pelvis showed several abscesses within the abdomen along with colovaginal fistula.  she was started on broad-spectrum IV antibiotics.  GI and general surgery were consulted.  Patient underwent flex sigmoidoscopy on 10/30/2019 showing diverticulitis in the rectosigmoid and sigmoid colon along with stenosis and narrowing.  GI recommended outpatient follow-up with colonoscopy.  Repeat CT of the abdomen pelvis on 9/9 showed loculated abscess within the pelvis increased in size and not amenable to percutaneous drainage given its location.  She underwent diagnostic laparotomy with laparoscopic diverting loop ileostomy on 11/04/2019.  MRI of the pelvis on 11/05/2019 demonstrated abscess/fistula cavity in the central pelvis containing air and fluid interspersed between the mid sigmoid colon and rectum measuring 3.7 into 3.1 cm suspicious for abscess.   On 9/22 CT abd and pelvis with contrast Shows small bowel obstruction, with transition point  identified within the right lower quadrant of the abdomen in the area of the inflamed appendix. She was made NPO and Ng tube was placed. TPN was started for nutrition.  She had brown fluid aspirated from the NG tube. Abdominal x-ray on 9/27 recommends advancing the NG tube 3 cm for better output.  Unfortunately the obstruction is proximal to her ileostomy. She will probably need repeat imaging with a CT vs laparotomy .   Post laparoscopy, enterolysis and opening of pelvic abscess, sigmoidoscopy, appendectomy on 11/21/2019 by Dr. Daphine Deutscher and Dr. Cliffton Asters.  Started on PCA pump for pain control.  11/26/19: No acute events overnight.  Zosyn DCed on 11/25/19 since abscess has now  been drained.  Still requiring IV narcotics for severe abdominal pain.  Exam benign.  Vital signs and labs stable.  Assessment/Plan: Principal Problem:   Intra-abdominal abscess (HCC) Active Problems:   Depression   Anxiety   Seizures (HCC)   COPD (chronic obstructive pulmonary disease) (HCC)   Seizure (HCC)  Sepsis, resolved, secondary to intra-abdominal/pelvic abscess from diverticulitis with colovaginal fistula Initial CT of the abdomen pelvis and MRI pelvis revealed multiple abscesses.  Not amenable to IR drainage given its location. S/p laparotomy with laparoscopic diverting loop ileostomy. Patient has been on IV Zosyn from 10/24/2019 Zosyn Dced 11/25/19. CT of the abdomen and pelvis on 11/13/19 showing small bowel obstruction with transition point in the right lower quadrant at the site of the appendix.  NG tube was placed on 11/13/19 and dced on 11/21/19. TPN started for nutrition and ongoing. Post laparoscopy, enterolysis and opening of pelvic abscess, sigmoidoscopy, appendectomy on 11/21/2019 by Dr. Daphine Deutscher and Dr. Cliffton Asters. Tolerating a diet. Afebrile with no leukocytosis Still requiring IV narcotics, will wean as tolerated  Resolved small bowel obstruction Management as per above Goal potassium greater than 4.0, goal magnesium greater than 2.0. Continue to monitor electrolytes and replete as indicated. Continue to mobilize as tolerated  Anxiety and depression. Stable Ativan to 1 mg every 4 hours prn.   COPD:  Stable No wheezing heard.   Continue with bronchodilators as needed  Resolved hypokalemia post repletion Serum potassium 3.9 Repeat not indicated  Resolved post repletion hypomagnesemia Serum magnesium 1.9 Replete as indicated.  Resolved post repletion hypophosphatemia:  Serum phosphorus 4.4  Seizures:  None this admission.  Not on specific home AEDs  GERD On PPI IV daily.  Anemia of chronic disease/acute blood loss anemia post surgery. Drop in  hemoglobin 8.0 from 8.4 from 9.9 Continue to monitor H&H  Essential hypertension BP is at goal. On IV Lopressor 5 mg 3 times daily. Continue to monitor vital signs    DVT prophylaxis: lovenox SQ daily.  Code Status: full code.  Family Communication: (none at bedside)    Consultants:   General surgery  Procedures:  9/8-Flex sigmoidoscopy revealed diverticulosis in the rectosigmoid and sigmoid colon along with the stenosis and narrowing. Biopsies from colon and rectum negative for inflammation or malignancy 9/13-diagnostic laparotomy with laparoscopic diverting loop ileostomy  11/21/2019-Laparoscopy, enterolysis and opening of pelvic abscess, sigmoidoscopy, appendectomy on 11/21/2019 by Dr. Daphine Deutscher and Dr. Cliffton Asters.        Status is: Inpatient    Dispo: The patient is from: Home              Anticipated d/c is TD:VVOH with home health services.               Anticipated d/c date is: 11/28/2019.              Patient currently no stable for discharge due to management of intra-abdominal abscesses, SBO, severe protein calorie malnutrition, on TPN.       Objective: Vitals:   11/25/19 1318 11/25/19 2215 11/26/19 0424 11/26/19 1307  BP: 107/71 109/75 103/71 109/74  Pulse: 88 79 83 85  Resp: 16 16 16    Temp: 98.2 F (36.8 C) 98.5 F (36.9 C) 98.6 F (37 C) 98.6 F (37 C)  TempSrc: Oral Oral Oral Oral  SpO2: 99% 100% 99% 98%  Weight:      Height:        Intake/Output Summary (Last 24 hours) at 11/26/2019 1804 Last data filed at 11/26/2019 1500 Gross per 24 hour  Intake 2132.41 ml  Output 2445 ml  Net -312.59 ml   Filed Weights   11/19/19 1022 11/21/19 0726 11/21/19 1303  Weight: 83.2 kg 80.2 kg 81.6 kg    Exam:  . General: 54 y.o. year-old female pleasant well-developed well-nourished no acute disease.  Alert oriented x3. . Cardiovascular: Regular rate and rhythm no rubs or gallops. 59 Respiratory: Clear to auscultation no wheezes or rales.  .  Abdomen:  Soft with bowel sounds present.  Ileostomy bag in place.   . Musculoskeletal: No lower extremity edema bilaterally. Marland Kitchen Psychiatry: Mood is appropriate for condition and setting.   Data Reviewed: CBC: Recent Labs  Lab 11/20/19 1316 11/21/19 0853 11/22/19 0524 11/23/19 1115 11/24/19 0320 11/25/19 0315 11/26/19 0342  WBC 8.6   < > 8.3 6.5 5.6 4.7 5.8  NEUTROABS 6.3  --  6.4  --   --  2.2  --   HGB 9.6*   < > 8.4* 8.0* 8.3* 8.0* 7.9*  HCT 30.9*   < > 26.5* 26.5* 26.2* 26.0* 25.5*  MCV 95.1   < > 95.7 97.8 96.7 96.3 97.3  PLT 350   < > 343 357 369 365 341   < > = values in this interval not displayed.   Basic Metabolic Panel: Recent Labs  Lab 11/21/19 0300 11/21/19 0300 11/21/19 1815 11/22/19 0524 11/23/19 0351 11/24/19 0942 11/25/19 0315  NA 135  --   --  135 137 135 139  K 3.7  --   --  4.4 4.0 3.9 3.9  CL 103  --   --  104 105 100 106  CO2 23  --   --  22 25 25 24   GLUCOSE 124*  --   --  164* 112* 168* 131*  BUN 15  --   --  14 12 12 14   CREATININE 0.47   < > 0.52 0.51 0.48 0.48 0.51  CALCIUM 9.6  --   --  8.8* 9.2 9.6 9.4  MG 1.8  --   --  1.7 1.8 1.7 1.9  PHOS 3.9  --   --   --   --  4.4 4.4   < > = values in this interval not displayed.   GFR: Estimated Creatinine Clearance: 88 mL/min (by C-G formula based on SCr of 0.51 mg/dL). Liver Function Tests: Recent Labs  Lab 11/20/19 1316 11/21/19 0300 11/22/19 0524 11/25/19 0315  AST 50* 34 45* 27  ALT 38 35 44 35  ALKPHOS 146* 137* 104 101  BILITOT 0.8 0.9 0.9 0.8  PROT 7.0 6.8 5.8* 6.0*  ALBUMIN 3.2* 3.3* 2.7* 2.8*   No results for input(s): LIPASE, AMYLASE in the last 168 hours. No results for input(s): AMMONIA in the last 168 hours. Coagulation Profile: No results for input(s): INR, PROTIME in the last 168 hours. Cardiac Enzymes: No results for input(s): CKTOTAL, CKMB, CKMBINDEX, TROPONINI in the last 168 hours. BNP (last 3 results) No results for input(s): PROBNP in the last 8760 hours. HbA1C: No  results for input(s): HGBA1C in the last 72 hours. CBG: No results for input(s): GLUCAP in the last 168 hours. Lipid Profile: Recent Labs    11/25/19 0315  TRIG 140   Thyroid Function Tests: No results for input(s): TSH, T4TOTAL, FREET4, T3FREE, THYROIDAB in the last 72 hours. Anemia Panel: No results for input(s): VITAMINB12, FOLATE, FERRITIN, TIBC, IRON, RETICCTPCT in the last 72 hours. Urine analysis:    Component Value Date/Time   COLORURINE YELLOW 10/24/2019 1822   APPEARANCEUR TURBID (A) 10/24/2019 1822   LABSPEC 1.013 10/24/2019 1822   PHURINE 5.0 10/24/2019 1822   GLUCOSEU NEGATIVE 10/24/2019 1822   HGBUR MODERATE (A) 10/24/2019 1822   BILIRUBINUR NEGATIVE 10/24/2019 1822   KETONESUR 5 (A) 10/24/2019 1822   PROTEINUR 100 (A) 10/24/2019 1822   UROBILINOGEN 0.2 08/23/2006 1144   NITRITE NEGATIVE 10/24/2019 1822   LEUKOCYTESUR LARGE (A) 10/24/2019 1822   Sepsis Labs: @LABRCNTIP (procalcitonin:4,lacticidven:4)  )No results found for this or any previous visit (from the past 240 hour(s)).    Studies: No results found.  Scheduled Meds: . acetaminophen  1,000 mg Oral TID  . aspirin EC  81 mg Oral Daily  . Chlorhexidine Gluconate Cloth  6 each Topical Daily  . enoxaparin (LOVENOX) injection  40 mg Subcutaneous Q24H  . ferrous sulfate  325 mg Oral BID WC  . gabapentin  200 mg Oral TID  . menthol-cetylpyridinium  1 lozenge Oral Q2H while awake  . methocarbamol  1,000 mg Oral Q8H  . metoprolol tartrate  5 mg Intravenous Q8H  . nicotine  7 mg Transdermal Daily  . pantoprazole  40 mg Oral Daily  . psyllium  1 packet Oral BID  . sodium chloride flush  10-40 mL Intracatheter Q12H    Continuous Infusions:    LOS: 33 days     12/24/2019, MD [facility] Pager [phone]  If 7PM-7AM, please contact night-coverage [email or web address] Password [email or web address] 11/26/2019, 6:04 PM

## 2019-11-26 NOTE — Progress Notes (Signed)
[facility] Progress Note  5 Days Post-Op  Subjective: Patient's primary complaint is pain. Taking dilaudid in between oxycodone doses. We discussed that transition to PO pain medication now will help transition from hospital to home. Encouraged patient to take oxycodone over dilaudid and maximize other modalities of pain relief. Patient tolerating soft diet and having bowel function. VSS.   Objective: Vital signs in last 24 hours: Temp:  [98.2 F (36.8 C)-98.6 F (37 C)] 98.6 F (37 C) (10/05 0424) Pulse Rate:  [79-88] 83 (10/05 0424) Resp:  [16-17] 16 (10/05 0424) BP: (103-119)/(71-77) 103/71 (10/05 0424) SpO2:  [99 %-100 %] 99 % (10/05 0424) Last BM Date: 11/25/19  Intake/Output from previous day: 10/04 0701 - 10/05 0700 In: 3419.6 [P.O.:1560; I.V.:1473.6; IV Piggyback:386] Out: 1875 [Urine:1250; Drains:50; Stool:575] Intake/Output this shift: No intake/output data recorded.  PE: General: pleasant, WD, WN female who is laying in bed in NAD Lungs: Respiratory effort nonlabored Abd: soft, appropriately ttp, ND, +BS, ileostomy working, drain with ss fluid, incisions c/d/i Psych: A&Ox3 with an anxious affect.   Lab Results:  Recent Labs    11/25/19 0315 11/26/19 0342  WBC 4.7 5.8  HGB 8.0* 7.9*  HCT 26.0* 25.5*  PLT 365 341   BMET Recent Labs    11/24/19 0942 11/25/19 0315  NA 135 139  K 3.9 3.9  CL 100 106  CO2 25 24  GLUCOSE 168* 131*  BUN 12 14  CREATININE 0.48 0.51  CALCIUM 9.6 9.4   PT/INR No results for input(s): LABPROT, INR in the last 72 hours. CMP     Component Value Date/Time   NA 139 11/25/2019 0315   K 3.9 11/25/2019 0315   CL 106 11/25/2019 0315   CO2 24 11/25/2019 0315   GLUCOSE 131 (H) 11/25/2019 0315   BUN 14 11/25/2019 0315   CREATININE 0.51 11/25/2019 0315   CREATININE 0.68 10/14/2010 1059   CALCIUM 9.4 11/25/2019 0315   PROT 6.0 (L) 11/25/2019 0315   ALBUMIN 2.8 (L) 11/25/2019 0315   AST 27 11/25/2019 0315   ALT  35 11/25/2019 0315   ALKPHOS 101 11/25/2019 0315   BILITOT 0.8 11/25/2019 0315   GFRNONAA >60 11/25/2019 0315   GFRAA >60 11/25/2019 0315   Lipase     Component Value Date/Time   LIPASE 18 09/19/2019 1459       Studies/Results: No results found.  Anti-infectives: Anti-infectives (From admission, onward)   Start     Dose/Rate Route Frequency Ordered Stop   11/21/19 1328  piperacillin-tazobactam (ZOSYN) 3.375 GM/50ML IVPB       Note to Pharmacy: Nadene Rubins   : cabinet override      11/21/19 1328 11/21/19 1336   11/14/19 1600  piperacillin-tazobactam (ZOSYN) IVPB 3.375 g  Status:  Discontinued        3.375 g 12.5 mL/hr over 240 Minutes Intravenous Every 8 hours 11/14/19 1514 11/25/19 1159   11/03/19 0845  cefoTEtan (CEFOTAN) 2 g in sodium chloride 0.9 % 100 mL IVPB  Status:  Discontinued        2 g 200 mL/hr over 30 Minutes Intravenous On call to O.R. 11/03/19 0831 11/03/19 0842   10/25/19 1000  vancomycin (VANCOCIN) IVPB 1000 mg/200 mL premix  Status:  Discontinued        1,000 mg 200 mL/hr over 60 Minutes Intravenous Every 12 hours 10/24/19 1835 10/28/19 1727   10/24/19 2300  piperacillin-tazobactam (ZOSYN) IVPB 3.375 g  Status:  Discontinued  3.375 g 12.5 mL/hr over 240 Minutes Intravenous Every 8 hours 10/24/19 1835 11/13/19 1557   10/24/19 2000  vancomycin (VANCOREADY) IVPB 1500 mg/300 mL  Status:  Discontinued        1,500 mg 150 mL/hr over 120 Minutes Intravenous  Once 10/24/19 1835 10/24/19 1836   10/24/19 2000  vancomycin (VANCOREADY) IVPB 1500 mg/300 mL        1,500 mg 150 mL/hr over 120 Minutes Intravenous  Once 10/24/19 1836 10/24/19 2229   10/24/19 1730  piperacillin-tazobactam (ZOSYN) IVPB 3.375 g        3.375 g 100 mL/hr over 30 Minutes Intravenous  Once 10/24/19 1721 10/24/19 1810       Assessment/Plan COPD History of seizures Anxiety/depression Anemia-Hgb stable Hypokalemia-resolved   POD#22/5 , s/pLAPAROSCOPIC diverting loop  ileostomy- 11/04/2019 - [facility] complicated diverticulitis, 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 Burley Saver 11/21/19 - WBC remains normal and pt afebrile off abx - ileostomy working - drain with 50 cc out, ss - will discuss possible drain removal with MD - prealbumin 19.6, on soft diet, dc TPN - likely ready for discharge from a surgical perspective in 1-2 days  FEN -soft diet VTE -Lovenox ID -zosyn 9/2 -->DC 3 days postop  LOS: 33 days    Juliet Rude , [facility] 11/26/2019, 8:30 AM Please see Amion for pager number during day hours 7:00am-4:30pm

## 2019-11-26 NOTE — TOC Progression Note (Signed)
Transition of Care [facility]) - Progression Note    Patient Details  Name: JUSTENE JENSEN MRN: [id number] Date of Birth: 1965-06-06  Transition [facility] CM/SW Contact  Clearance Coots, LCSW Phone Number: 11/26/2019, 11:38 AM  Clinical Narrative:    Patient has requested to arrange her own follow up appointment at [facility]. Patient will use one time free-fill for medications.   TOC staff will continue to assist with discharge needs.    Expected Discharge Plan: Home w Home Health Services Barriers to Discharge: Continued Medical Work up  Expected Discharge Plan and Services Expected Discharge Plan: Home w Home Health Services In-house Referral: Clinical Social Work Discharge Planning Services: Indigent Health Clinic, Medication Assistance   Living arrangements for the past 2 months: Apartment                 DME Arranged: N/A DME Agency: NA       HH Arranged: RN HH Agency: [facility] Date HH Agency Contacted: 11/05/19 Time HH Agency Contacted: 1403 Representative spoke with at [facility] Agency: Amy Hyatt   Social Determinants of Health (SDOH) Interventions    Readmission Risk Interventions No flowsheet data found.

## 2019-11-26 NOTE — Discharge Instructions (Signed)
Loop Ileostomy, Care After This sheet gives you information about how to care for yourself after your procedure. Your health care provider may also give you more specific instructions. If you have problems or questions, contact your health care provider. What can I expect after the procedure? After the procedure, it is common to have:  A dark-colored, swollen, and bruised stoma.  A small amount of blood or clear fluid draining from your stoma or rectum.  Pain and discomfort in your abdomen, especially around your stoma. You will be given medicines to help relieve the pain.  Irregular bowel movements for several days.  Loose stools. The stools will gradually become firmer and more regular. Follow these instructions at home: Medicines  Take over-the-counter and prescription medicines only as told by your health care provider.  If you were prescribed an antibiotic medicine, take it as told by your health care provider. Do not stop taking the antibiotic even if you start to feel better.  Your health care provider may change some medicines after the procedure. Certain medicines are absorbed differently with an ileostomy. Stoma care   Follow instructions from your health care provider about how to take care of your stoman. Make sure you: ? Wash your hands with soap and water before you change your bandage (dressing) or care for your stoma. If soap and water are not available, use hand sanitizer. ? Change your dressing or ostomy pouch as told by your health care provider. ? Leave stitches (sutures), skin glue, or adhesive strips in place. In some cases, these skin closures may need to be in place for 2 weeks or longer. If adhesive strip edges start to loosen and curl up, you may trim the loose edges. Do not remove adhesive strips entirely unless your health care provider tells you to remove them.  Check your stoma and the area around the stoma every day for signs of infection. Check  for: ? More redness, swelling, or pain. ? More fluid or blood. ? Pus or a bad smell. ? Warmth.  Keep your stoma and the surrounding area clean and dry.  Do not take baths, swim, or use a hot tub until your health care provider says it is okay to do so. Eating and drinking   Follow instructions from your health care provider about eating or drinking restrictions.  Pay attention to which foods and drinks cause problems, such as gas, constipation, or diarrhea.  Avoid spicy foods and caffeine while your stoma heals.  Eat meals and snacks at regular intervals.  Drink enough fluid to keep your urine pale yellow. Activity  Return to your normal activities as told by your health care provider. Ask your health care provider what activities are safe for you.  Rest as much as possible while your stoma heals.  Avoid intense physical activity for as long as you are told by your health care provider.  Do not lift anything that is heavier than 10 lb (4.5 kg) for 6 weeks, or for as long as you are told by your health care provider. Driving  Do not drive for 24 hours if you received a sedative.  Do not drive or operate heavy machinery while taking prescription pain medicine. General instructions  Follow your health care provider's instructions about changing and cleaning your ostomy pouch.  Keep supplies to care for your stoma and ostomy pouch with you at all times.  Wear compression stockings as told by your health care provider. These stockings help to prevent  blood clots and reduce swelling in your legs.  Do not use any products that contain nicotine or tobacco, such as cigarettes and e-cigarettes. These can delay healing after surgery. If you need help quitting, ask your health care provider.  If you are a woman and are planning to become pregnant, or are taking birth control pills, discuss this with your health care provider.  Keep all follow-up visits as told by your health care  provider. This is important. Contact a health care provider if:  You have more redness, swelling, or pain around your stoma.  You have more fluid or blood coming from your stoma.  Your stoma feels warm to the touch.  You have pus or a bad smell coming from your stoma.  You have a fever.  You have loose stools that do not get firmer after several weeks.  You have bowel movements more or less often than is expected by your health care provider.  You have nausea or you vomit.  You have pain in the abdomen, bloating, pressure, or cramping.  You have problems with sexual activity.  You feel very tired (fatigue).  You are unusually thirsty or you always have a dry mouth.  You are having trouble keeping your ostomy pouch on for less than 2 days. Get help right away if:  You feel dizzy or lightheaded.  Your pain or cramps in the abdomen get worse, or do not go away when you take medicine.  Your stoma suddenly changes size or color.  You have shortness of breath.  You have bleeding from your stoma that does not stop.  You vomit more than once.  You faint.  You have internal tissue bulging out of your stoma (prolapse).  You have an irregular heartbeat.  You have chest pain. Summary  After your procedure, there may be a small amount of blood or clear fluid leaking from your stoma or rectum. You may also have pain and loose stools. You will be given medicine for pain. Loose stools will become firmer and more regular over time.  Take all medicines as told by your health care provider. Some medicines may be change because of the ileostomy.  Your health care provider will instruct you about caring for your ileostomy. You will also be told about any activity and eating and drinking restrictions.  If you have problems or questions, about your loop ileostomy, contact your health care provider.  Keep all follow-up visits as told by your health care provider. This is  important. This information is not intended to replace advice given to you by your health care provider. Make sure you discuss any questions you have with your health care provider. Document Revised: 04/13/2017 Document Reviewed: 03/20/2017 Elsevier Patient Education  2020 [employer]   [facility] Surgical drains are used to remove extra fluid that normally builds up in a surgical wound after surgery. A surgical drain helps to heal a surgical wound. Different kinds of surgical drains include:  Active drains. These drains use suction to pull drainage away from the surgical wound. Drainage flows through a tube to a container outside of the body. With these drains, you need to keep the bulb or the drainage container flat (compressed) at all times, except while you empty it. Flattening the bulb or container creates suction.  Passive drains. These drains allow fluid to drain naturally, by gravity. Drainage flows through a tube to a bandage (dressing) or a container outside of the body. Passive  drains do not need to be emptied. A drain is placed during surgery. Right after surgery, drainage is usually bright red and a little thicker than water. The drainage may gradually turn yellow or pink and become thinner. It is likely that your health care provider will remove the drain when the drainage stops or when the amount decreases to 1-2 Tbsp (15-30 mL) during a 24-hour period. Supplies needed:  Tape.  Germ-free cleaning solution (sterile saline).  Cotton swabs.  Split gauze drain sponge: 4 x 4 inches (10 x 10 cm).  Gauze square: 4 x 4 inches (10 x 10 cm). How to care for your surgical drain Care for your drain as told by your health care provider. This is important to help prevent infection. If your drain is placed at your back, or any other hard-to-reach area, ask another person to assist you in performing the following tasks: General care  Keep the skin around the drain dry and  covered with a dressing at all times.  Check your drain area every day for signs of infection. Check for: ? Redness, swelling, or pain. ? Pus or a bad smell. ? Cloudy drainage. ? Tenderness or pressure at the drain exit site. Changing the dressing Follow instructions from your health care provider about how to change your dressing. Change your dressing at least once a day. Change it more often if needed to keep the dressing dry. Make sure you: 1. Gather your supplies. 2. Wash your hands with soap and water before you change your dressing. If soap and water are not available, use hand sanitizer. 3. Remove the old dressing. Avoid using scissors to do that. 4. Wash your hands with soap and water again after removing the old dressing. 5. Use sterile saline to clean your skin around the drain. You may need to use a cotton swab to clean the skin. 6. Place the tube through the slit in a drain sponge. Place the drain sponge so that it covers your wound. 7. Place the gauze square or another drain sponge on top of the drain sponge that is on the wound. Make sure the tube is between those layers. 8. Tape the dressing to your skin. 9. Tape the drainage tube to your skin 1-2 inches (2.5-5 cm) below the place where the tube enters your body. Taping keeps the tube from pulling on any stitches (sutures) that you have. 10. Wash your hands with soap and water. 11. Write down the color of your drainage and how often you change your dressing. How to empty your active drain  1. Make sure that you have a measuring cup that you can empty your drainage into. 2. Wash your hands with soap and water. If soap and water are not available, use hand sanitizer. 3. Loosen any pins or clips that hold the tube in place. 4. If your health care provider tells you to strip the tube to prevent clots and tube blockages: ? Hold the tube at the skin with one hand. Use your other hand to pinch the tubing with your thumb and first  finger. ? Gently move your fingers down the tube while squeezing very lightly. This clears any drainage, clots, or tissue from the tube. ? You may need to do this several times each day to keep the tube clear. Do not pull on the tube. 5. Open the bulb cap or the drain plug. Do not touch the inside of the cap or the bottom of the plug. 6. Turn  the device upside down and gently squeeze. 7. Empty all of the drainage into the measuring cup. 8. Compress the bulb or the container and replace the cap or the plug. To compress the bulb or the container, squeeze it firmly in the middle while you close the cap or plug the container. 9. Write down the amount of drainage that you have in each 24-hour period. If you have less than 2 Tbsp (30 mL) of drainage during 24 hours, contact your health care provider. 10. Flush the drainage down the toilet. 11. Wash your hands with soap and water. Contact a health care provider if:  You have redness, swelling, or pain around your drain area.  You have pus or a bad smell coming from your drain area.  You have a fever or chills.  The skin around your drain is warm to the touch.  The amount of drainage that you have is increasing instead of decreasing.  You have drainage that is cloudy.  There is a sudden stop or a sudden decrease in the amount of drainage that you have.  Your drain tube falls out.  Your active drain does not stay compressed after you empty it. Summary  Surgical drains are used to remove extra fluid that normally builds up in a surgical wound after surgery.  Different kinds of surgical drains include active drains and passive drains. Active drains use suction to pull drainage away from the surgical wound, and passive drains allow fluid to drain naturally.  It is important to care for your drain to prevent infection. If your drain is placed at your back, or any other hard-to-reach area, ask another person to assist you.  Contact your health  care provider if you have redness, swelling, or pain around your drain area. This information is not intended to replace advice given to you by your health care provider. Make sure you discuss any questions you have with your health care provider. Document Revised: 03/14/2018 Document Reviewed: 03/14/2018 Elsevier Patient Education  2020 [employer]   Surgical Drain Record Empty your surgical drain as told by your health care provider. Use this form to write down the amount of fluid that has collected in the drainage container. Bring this form with you to your follow-up visits.   Date __________ Time __________ Amount __________ Date __________ Time __________ Amount __________ Date __________ Time __________ Amount __________ Date __________ Time __________ Amount __________ Date __________ Time __________ Amount __________ Date __________ Time __________ Amount __________ Date __________ Time __________ Amount __________ Date __________ Time __________ Amount __________ Date __________ Time __________ Amount __________ Date __________ Time __________ Amount __________ Date __________ Time __________ Amount __________ Date __________ Time __________ Amount __________ Date __________ Time __________ Amount __________ Date __________ Time __________ Amount __________ Date __________ Time __________ Amount __________ Date __________ Time __________ Amount __________ Date __________ Time __________ Amount __________ Date __________ Time __________ Amount __________ Date __________ Time __________ Amount __________ Date __________ Time __________ Amount __________ Date __________ Time __________ Amount __________ Date __________ Time __________ Amount __________ Date __________ Time __________ Amount __________ Date __________ Time __________ Amount __________ Date __________ Time __________ Amount __________ Date __________ Time __________ Amount __________ Date __________ Time  __________ Amount __________ Date __________ Time __________ Amount __________ Date __________ Time __________ Amount __________ Date __________ Time __________ Amount __________ Date __________ Time __________ Amount __________ Date __________ Time __________ Amount __________ Date __________ Time __________ Amount __________ Date __________ Time __________ Amount __________ Date __________ Time  __________ Amount __________ Date __________ Time __________ Amount __________ Date __________ Time __________ Amount __________ Date __________ Time __________ Amount __________ Date __________ Time __________ Amount __________ Date __________ Time __________ Amount __________ Date __________ Time __________ Amount __________ Date __________ Time __________ Amount __________ This information is not intended to replace advice given to you by your health care provider. Make sure you discuss any questions you have with your health care provider. Document Revised: 11/14/2016 Document Reviewed: 11/14/2016 Elsevier Patient Education  2020 [employer].

## 2019-11-27 ENCOUNTER — Other Ambulatory Visit (HOSPITAL_COMMUNITY): Payer: Self-pay | Admitting: Internal Medicine

## 2019-11-27 LAB — CBC
HCT: 27 % — ABNORMAL LOW (ref 36.0–46.0)
Hemoglobin: 8.1 g/dL — ABNORMAL LOW (ref 12.0–15.0)
MCH: 29.5 pg (ref 26.0–34.0)
MCHC: 30 g/dL (ref 30.0–36.0)
MCV: 98.2 fL (ref 80.0–100.0)
Platelets: 395 10*3/uL (ref 150–400)
RBC: 2.75 MIL/uL — ABNORMAL LOW (ref 3.87–5.11)
RDW: 14.7 % (ref 11.5–15.5)
WBC: 6.7 10*3/uL (ref 4.0–10.5)
nRBC: 0 % (ref 0.0–0.2)

## 2019-11-27 MED ORDER — HYDROMORPHONE HCL 1 MG/ML IJ SOLN
0.5000 mg | INTRAMUSCULAR | Status: DC | PRN
  Administered 2019-11-27: 0.5 mg via INTRAVENOUS
  Filled 2019-11-27: qty 0.5

## 2019-11-27 MED ORDER — METOPROLOL TARTRATE 50 MG PO TABS
50.0000 mg | ORAL_TABLET | Freq: Three times a day (TID) | ORAL | Status: DC
  Filled 2019-11-27: qty 1

## 2019-11-27 MED ORDER — METHOCARBAMOL 500 MG PO TABS
1000.0000 mg | ORAL_TABLET | Freq: Four times a day (QID) | ORAL | 0 refills | Status: DC
Start: 2019-11-27 — End: 2019-11-27

## 2019-11-27 MED ORDER — LOPERAMIDE HCL 2 MG PO CAPS: 2.0000 mg | ORAL_CAPSULE | Freq: Three times a day (TID) | ORAL | 0 refills | Status: AC | PRN

## 2019-11-27 MED ORDER — PANTOPRAZOLE SODIUM 40 MG PO TBEC
40.0000 mg | DELAYED_RELEASE_TABLET | Freq: Every day | ORAL | 0 refills | Status: DC
Start: 2019-11-28 — End: 2019-11-28

## 2019-11-27 MED ORDER — FERROUS SULFATE 325 (65 FE) MG PO TABS: 325.0000 mg | ORAL_TABLET | Freq: Two times a day (BID) | ORAL | 3 refills | Status: AC

## 2019-11-27 MED ORDER — METHOCARBAMOL 500 MG PO TABS
1000.0000 mg | ORAL_TABLET | Freq: Four times a day (QID) | ORAL | Status: DC
  Administered 2019-11-27: 1000 mg via ORAL
  Filled 2019-11-27: qty 2

## 2019-11-27 MED ORDER — OXYCODONE HCL 10 MG PO TABS
10.0000 mg | ORAL_TABLET | Freq: Four times a day (QID) | ORAL | 0 refills | Status: DC | PRN
Start: 2019-11-27 — End: 2019-11-27

## 2019-11-27 MED ORDER — GABAPENTIN 100 MG PO CAPS: 200.0000 mg | ORAL_CAPSULE | Freq: Three times a day (TID) | ORAL | 2 refills | Status: DC

## 2019-11-27 MED ORDER — LORAZEPAM 0.5 MG PO TABS: 0.5000 mg | ORAL_TABLET | ORAL | Status: DC | PRN

## 2019-11-27 MED ORDER — NICOTINE 7 MG/24HR TD PT24: 7.0000 mg | MEDICATED_PATCH | Freq: Every day | TRANSDERMAL | 0 refills | Status: AC

## 2019-11-27 MED ORDER — METOPROLOL TARTRATE 75 MG PO TABS: 75.0000 mg | ORAL_TABLET | Freq: Two times a day (BID) | ORAL | 2 refills | Status: AC

## 2019-11-27 MED FILL — METHOCARBAMOL 500 MG TABS: 500 | 15 days supply | Qty: 120 | Fill #0

## 2019-11-27 MED FILL — PANTOPRAZOLE SOD DR 40 MG T: 40 | 30 days supply | Qty: 30 | Fill #0

## 2019-11-27 MED FILL — GABAPENTIN 100 MG CAPSULE: 100 | 30 days supply | Qty: 180 | Fill #0

## 2019-11-27 MED FILL — oxyCODONE HCL 10 MG TABS: 10 | 5 days supply | Qty: 20 | Fill #0

## 2019-11-27 NOTE — Progress Notes (Signed)
[facility] Progress Note  6 Days Post-Op  Subjective: Patient sleeping when I entered room and then reports severe abdominal pain when she awakened. She is tolerating diet and having bowel function. I offered lidocaine patch for additional pain control and patient reports that those don't work for her. Discussed transition to PO pain control again.   Objective: Vital signs in last 24 hours: Temp:  [98 F (36.7 C)-98.6 F (37 C)] 98 F (36.7 C) (10/06 0548) Pulse Rate:  [83-90] 90 (10/06 0548) Resp:  [16] 16 (10/06 0548) BP: (108-109)/(68-74) 109/68 (10/06 0548) SpO2:  [97 %-98 %] 98 % (10/06 0548) Last BM Date: 11/26/19  Intake/Output from previous day: 10/05 0701 - 10/06 0700 In: 946.3 [P.O.:600; I.V.:346.3] Out: 1175 [Urine:600; Stool:575] Intake/Output this shift: No intake/output data recorded.  PE: General: pleasant, WD, WN female who is laying in bed in NAD Lungs: Respiratory effort nonlabored Abd: soft, appropriately ttp, ND, +BS, ileostomy working, drain with ss fluid, incisions c/d/i Psych: A&Ox3 with an anxious affect.   Lab Results:  Recent Labs    11/26/19 0342 11/27/19 0210  WBC 5.8 6.7  HGB 7.9* 8.1*  HCT 25.5* 27.0*  PLT 341 395   BMET Recent Labs    11/24/19 0942 11/25/19 0315  NA 135 139  K 3.9 3.9  CL 100 106  CO2 25 24  GLUCOSE 168* 131*  BUN 12 14  CREATININE 0.48 0.51  CALCIUM 9.6 9.4   PT/INR No results for input(s): LABPROT, INR in the last 72 hours. CMP     Component Value Date/Time   NA 139 11/25/2019 0315   K 3.9 11/25/2019 0315   CL 106 11/25/2019 0315   CO2 24 11/25/2019 0315   GLUCOSE 131 (H) 11/25/2019 0315   BUN 14 11/25/2019 0315   CREATININE 0.51 11/25/2019 0315   CREATININE 0.68 10/14/2010 1059   CALCIUM 9.4 11/25/2019 0315   PROT 6.0 (L) 11/25/2019 0315   ALBUMIN 2.8 (L) 11/25/2019 0315   AST 27 11/25/2019 0315   ALT 35 11/25/2019 0315   ALKPHOS 101 11/25/2019 0315   BILITOT 0.8 11/25/2019  0315   GFRNONAA >60 11/25/2019 0315   GFRAA >60 11/25/2019 0315   Lipase     Component Value Date/Time   LIPASE 18 09/19/2019 1459       Studies/Results: No results found.  Anti-infectives: Anti-infectives (From admission, onward)   Start     Dose/Rate Route Frequency Ordered Stop   11/21/19 1328  piperacillin-tazobactam (ZOSYN) 3.375 GM/50ML IVPB       Note to Pharmacy: Nadene Rubins   : cabinet override      11/21/19 1328 11/21/19 1336   11/14/19 1600  piperacillin-tazobactam (ZOSYN) IVPB 3.375 g  Status:  Discontinued        3.375 g 12.5 mL/hr over 240 Minutes Intravenous Every 8 hours 11/14/19 1514 11/25/19 1159   11/03/19 0845  cefoTEtan (CEFOTAN) 2 g in sodium chloride 0.9 % 100 mL IVPB  Status:  Discontinued        2 g 200 mL/hr over 30 Minutes Intravenous On call to O.R. 11/03/19 0831 11/03/19 0842   10/25/19 1000  vancomycin (VANCOCIN) IVPB 1000 mg/200 mL premix  Status:  Discontinued        1,000 mg 200 mL/hr over 60 Minutes Intravenous Every 12 hours 10/24/19 1835 10/28/19 1727   10/24/19 2300  piperacillin-tazobactam (ZOSYN) IVPB 3.375 g  Status:  Discontinued        3.375 g 12.5 mL/hr  over 240 Minutes Intravenous Every 8 hours 10/24/19 1835 11/13/19 1557   10/24/19 2000  vancomycin (VANCOREADY) IVPB 1500 mg/300 mL  Status:  Discontinued        1,500 mg 150 mL/hr over 120 Minutes Intravenous  Once 10/24/19 1835 10/24/19 1836   10/24/19 2000  vancomycin (VANCOREADY) IVPB 1500 mg/300 mL        1,500 mg 150 mL/hr over 120 Minutes Intravenous  Once 10/24/19 1836 10/24/19 2229   10/24/19 1730  piperacillin-tazobactam (ZOSYN) IVPB 3.375 g        3.375 g 100 mL/hr over 30 Minutes Intravenous  Once 10/24/19 1721 10/24/19 1810       Assessment/Plan COPD History of seizures Anxiety/depression Anemia-Hgb stable Hypokalemia-resolved   POD#23/6, s/pLAPAROSCOPIC diverting loop ileostomy- 11/04/2019 - [place] complicated diverticulitis, possible  malignancy, colovaginal fistula, Dx lap with LOA, appendectomy, drainage of abscess, Dr. Burley Saver 11/21/19 - WBC remains normal and pt afebrile off abx - ileostomy working - continue drain on discharge, will remove in the office - continue soft diet - decreased dilaudid dose and increased robaxin to QID, encouraged PO oxycodone over IV dilaudid - stable for discharge from a surgical standpoint, it seems like plan is for d/c tomorrow  FEN -soft diet VTE -Lovenox ID -currently off all abx  LOS: 34 days    Juliet Rude , [facility] Surgery 11/27/2019, 8:39 AM Please see Amion for pager number during day hours 7:00am-4:30pm

## 2019-11-27 NOTE — TOC Transition Note (Signed)
Transition of Care [facility]) - CM/SW Discharge Note   Patient Details  Name: Susan Todd MRN: [id number] Date of Birth: 23-Jan-1966  Transition of Care [facility]) CM/SW Contact:  Clearance Coots, LCSW Phone Number: 11/27/2019, 12:35 PM   Clinical Narrative:    Patient pleasantly declines services at [facility] and [facility]. Patient reports she has a currently primary care provider Dr. Donette Larry, Jerelyn Scott, MD. She will continue to pay out of pocket for medications, including new medications. [facility] OP pharmacy to deliver medications to the pt. bedside.  [facility] cannot provide RN charity. CSW reached out to [facility] for RN services.The agency will follow up with the patient. CSW provided the patient with the contact information.       Final next level of care: Home w Home Health Services Barriers to Discharge: Barriers Resolved   Patient Goals and CMS Choice Patient states their goals for this hospitalization and ongoing recovery are:: get well CMS Medicare.gov Compare Post Acute Care list provided to:: Patient Choice offered to / list presented to : Patient  Discharge Placement                       Discharge Plan and Services In-house Referral: Clinical Social Work Discharge Planning Services: Indigent Health Clinic, Medication Assistance            DME Arranged: N/A DME Agency: NA       HH Arranged: RN HH Agency: [facility] Date HH Agency Contacted: 11/27/19 Time HH Agency Contacted: 1234 Representative spoke with at [facility] Agency: [place]  Social Determinants of Health (SDOH) Interventions     Readmission Risk Interventions No flowsheet data found.

## 2019-11-27 NOTE — Discharge Summary (Signed)
Physician Discharge Summary  Susan Todd SFK:[id number] DOB: 1966-01-07 DOA: 10/24/2019  PCP: Georgann Housekeeper, MD  Admit date: 10/24/2019 Discharge date: 11/27/2019  Admitted From: Home  Discharge disposition: Home with [facility]   Recommendations for Outpatient Follow-Up:   . Follow up with your primary care provider in 1-2 weeks . Check CBC, BMP, magnesium in the next visit . Follow-up with general surgery as has been scheduled.  Patient has a drain tube that needs to be addressed in the next visit.  Discharge Diagnosis:   Principal Problem:   Intra-abdominal abscess (HCC) Active Problems:   Depression   Anxiety   Seizures (HCC)   COPD (chronic obstructive pulmonary disease) (HCC)   Seizure (HCC)   Discharge Condition: Improved.  Diet recommendation: Soft diet  Wound care: Local drain care  Code status: Full.   History of Present Illness:   54 year old female with prior history of hypertension, hyperlipidemia, chronic anxiety, depression, seizure disorder, COPD presented on 9/2 with worsening abdominal pain. CT of the abdomen pelvis showed several abscesses within the abdomen along with colovaginal fistula.  She was started on broad-spectrum IV antibiotics. GI and general surgery were consulted. Patient underwent flex sigmoidoscopy on 10/30/2019 showing diverticulitis in the rectosigmoid and sigmoid colon along with stenosis and narrowing. Repeat CT of the abdomen pelvis on 9/9 showed loculated abscess within the pelvis increased in size and not amenable to percutaneous drainage given its location. She underwent diagnostic laparotomy with laparoscopic diverting loop ileostomy on 11/04/2019. On 9/22CT abd and pelvis with contrastShowssmall bowel obstruction, with transition point identified within the right lower quadrant of the abdomen in the area of the inflamed appendix. She was made NPO and Ng tube was placed. TPN was startedfor nutrition. She had brown fluid aspirated from  the NG tube. She underwent laparoscopy, enterolysis and opening of pelvic abscess, sigmoidoscopy, appendectomy on 11/21/2019 by Dr. Daphine Deutscher and Dr. Cliffton Asters.   Hospital Course:   Following conditions were addressed during hospitalization as listed below,  Sepsis, resolved,secondary to intra-abdominal/pelvic abscess from diverticulitis with colovaginal fistula S/p laparotomy with laparoscopic diverting loop ileostomy. Patient had been on IV Zosyn from 10/24/2019. Zosyn was discontinued on 11/25/19. CT of the abdomen and pelvison 9/22/21showed small bowel obstruction with transition point in the right lower quadrant at the site of the appendix. NG tube was placed on 11/13/19 and dced on 11/21/19. TPN startedfor nutritionand ongoing. Post laparoscopy, enterolysis and opening of pelvic abscess, sigmoidoscopy, appendectomy on 11/21/2019 by Dr. Daphine Deutscher and Dr. Cliffton Asters.  At this time patient is tolerating a diet.  Afebrile with no leukocytosis.  Minimal need for IV narcotics.  Patient has been seen by general surgery as well.  Small bowel obstruction. Resolved at this time.  Anxiety and depression. Stable.  COPD: Remained stable.  Hypokalemia, hypomagnesemia, hypophosphatemia.  Replenished and improved  Seizures: Not on antiepileptic medication.  GERD Continue Protonix  Anemia of chronic disease/acute blood loss anemia post surgery. Outpatient monitoring.  Essential hypertension Resume losartan from home.  Disposition.  At this time, patient is stable for disposition for disposition home with home health.  Patient does have an appointment to follow-up with surgery as outpatient.  Medical Consultants:    General surgery  Procedures:    9/8-Flex sigmoidoscopy revealed diverticulosis in the rectosigmoid and sigmoid colon along with the stenosis and narrowing. Biopsies from colon and rectum negative for inflammation or malignancy  9/13-diagnostic laparotomy with laparoscopic  diverting loop ileostomy  11/21/2019-Laparoscopy, enterolysis and opening of pelvic abscess, sigmoidoscopy, appendectomy  on 11/21/2019 by Dr. Daphine Deutscher and Dr. Cliffton Asters.  Subjective:   Today, patient was seen and examined at bedside.  Has mild pain.  No nausea vomiting.  Has tolerated oral diet.  Discharge Exam:   Vitals:   11/26/19 2154 11/27/19 0548  BP: 108/73 109/68  Pulse: 83 90  Resp: 16 16  Temp: 98 F (36.7 C) 98 F (36.7 C)  SpO2: 97% 98%   Vitals:   11/26/19 0424 11/26/19 1307 11/26/19 2154 11/27/19 0548  BP: 103/71 109/74 108/73 109/68  Pulse: 83 85 83 90  Resp: 16  16 16   Temp: 98.6 F (37 C) 98.6 F (37 C) 98 F (36.7 C) 98 F (36.7 C)  TempSrc: Oral Oral    SpO2: 99% 98% 97% 98%  Weight:      Height:       General: Alert awake, not in obvious distress HENT: pupils equally reacting to light,  No scleral pallor or icterus noted. Oral mucosa is moist.  Chest:  Clear breath sounds.  Diminished breath sounds bilaterally. No crackles or wheezes.  CVS: S1 &S2 heard. No murmur.  Regular rate and rhythm. Abdomen: Soft, nontender, nondistended.  Bowel sounds are heard.  Ileostomy bag in place.  Drain in place as well.  Incision clean dry and intact. Extremities: No cyanosis, clubbing or edema.  Peripheral pulses are palpable. Psych: Alert, awake and oriented, normal mood CNS:  No cranial nerve deficits.  Power equal in all extremities.   Skin: Warm and dry.  No rashes noted.  The results of significant diagnostics from this hospitalization (including imaging, microbiology, ancillary and laboratory) are listed below for reference.     Diagnostic Studies:   No results found.   Labs:   Basic Metabolic Panel: Recent Labs  Lab 11/21/19 0300 11/21/19 0300 11/21/19 1815 11/22/19 0524 11/22/19 0524 11/23/19 0351 11/23/19 0351 11/24/19 0942 11/25/19 0315  NA 135  --   --  135  --  137  --  135 139  K 3.7   < >  --  4.4   < > 4.0   < > 3.9 3.9  CL 103  --   --   104  --  105  --  100 106  CO2 23  --   --  22  --  25  --  25 24  GLUCOSE 124*  --   --  164*  --  112*  --  168* 131*  BUN 15  --   --  14  --  12  --  12 14  CREATININE 0.47   < > 0.52 0.51  --  0.48  --  0.48 0.51  CALCIUM 9.6  --   --  8.8*  --  9.2  --  9.6 9.4  MG 1.8  --   --  1.7  --  1.8  --  1.7 1.9  PHOS 3.9  --   --   --   --   --   --  4.4 4.4   < > = values in this interval not displayed.   GFR Estimated Creatinine Clearance: 88 mL/min (by C-G formula based on SCr of 0.51 mg/dL). Liver Function Tests: Recent Labs  Lab 11/20/19 1316 11/21/19 0300 11/22/19 0524 11/25/19 0315  AST 50* 34 45* 27  ALT 38 35 44 35  ALKPHOS 146* 137* 104 101  BILITOT 0.8 0.9 0.9 0.8  PROT 7.0 6.8 5.8* 6.0*  ALBUMIN 3.2* 3.3* 2.7* 2.8*  No results for input(s): LIPASE, AMYLASE in the last 168 hours. No results for input(s): AMMONIA in the last 168 hours. Coagulation profile No results for input(s): INR, PROTIME in the last 168 hours.  CBC: Recent Labs  Lab 11/20/19 1316 11/21/19 0853 11/22/19 0524 11/22/19 0524 11/23/19 1115 11/24/19 0320 11/25/19 0315 11/26/19 0342 11/27/19 0210  WBC 8.6   < > 8.3   < > 6.5 5.6 4.7 5.8 6.7  NEUTROABS 6.3  --  6.4  --   --   --  2.2  --   --   HGB 9.6*   < > 8.4*   < > 8.0* 8.3* 8.0* 7.9* 8.1*  HCT 30.9*   < > 26.5*   < > 26.5* 26.2* 26.0* 25.5* 27.0*  MCV 95.1   < > 95.7   < > 97.8 96.7 96.3 97.3 98.2  PLT 350   < > 343   < > 357 369 365 341 395   < > = values in this interval not displayed.   Cardiac Enzymes: No results for input(s): CKTOTAL, CKMB, CKMBINDEX, TROPONINI in the last 168 hours. BNP: Invalid input(s): POCBNP CBG: No results for input(s): GLUCAP in the last 168 hours. D-Dimer No results for input(s): DDIMER in the last 72 hours. Hgb A1c No results for input(s): HGBA1C in the last 72 hours. Lipid Profile Recent Labs    11/25/19 0315  TRIG 140   Thyroid function studies No results for input(s): TSH, T4TOTAL,  T3FREE, THYROIDAB in the last 72 hours.  Invalid input(s): FREET3 Anemia work up No results for input(s): VITAMINB12, FOLATE, FERRITIN, TIBC, IRON, RETICCTPCT in the last 72 hours. Microbiology No results found for this or any previous visit (from the past 240 hour(s)).   Discharge Instructions:   Discharge Instructions     Call MD for:  persistant nausea and vomiting   Complete by: As directed    Call MD for:  severe uncontrolled pain   Complete by: As directed    Call MD for:  temperature >100.4   Complete by: As directed    Diet - low sodium heart healthy   Complete by: As directed    Discharge instructions   Complete by: As directed    Follow up with general surgery as scheduled. Follow up with your primary care provider in 1-2 weeks. Keep drain until seen by surgery. Take all medications as prescribed.   Discharge wound care:   Complete by: As directed    Keep the drain   Increase activity slowly   Complete by: As directed       Allergies as of 11/27/2019       Reactions   Codeine Nausea And Vomiting   Latex Hives   Ciprofloxacin    Made pt dizzy   Metronidazole Diarrhea   Patient is not able to take tablet form.        Medication List     STOP taking these medications    amoxicillin-clavulanate 875-125 MG tablet Commonly known as: Augmentin   HYDROcodone-acetaminophen 5-325 MG tablet Commonly known as: NORCO/VICODIN   ibuprofen 800 MG tablet Commonly known as: ADVIL       TAKE these medications    acetaminophen 500 MG tablet Commonly known as: TYLENOL Take 500 mg by mouth every 6 (six) hours as needed for mild pain or headache.   albuterol 108 (90 Base) MCG/ACT inhaler Commonly known as: VENTOLIN HFA Inhale 2 puffs into the lungs every 6 (six) hours as  needed for wheezing or shortness of breath.   aspirin EC 81 MG tablet Take 81 mg by mouth daily.   atorvastatin 10 MG tablet Commonly known as: LIPITOR Take 10 mg by mouth every  evening.   calcium-vitamin D 500-200 MG-UNIT Tabs tablet Commonly known as: OSCAL WITH D Take 1 tablet by mouth at bedtime.   cetirizine 5 MG tablet Commonly known as: ZYRTEC Take 5 mg by mouth at bedtime.   ferrous sulfate 325 (65 FE) MG tablet Take 1 tablet (325 mg total) by mouth 2 (two) times daily with a meal.   gabapentin 100 MG capsule Commonly known as: NEURONTIN Take 2 capsules (200 mg total) by mouth 3 (three) times daily. What changed:   how much to take  when to take this  reasons to take this   loperamide 2 MG capsule Commonly known as: IMODIUM Take 1 capsule (2 mg total) by mouth every 8 (eight) hours as needed for diarrhea or loose stools (Use if >2 BM every 8 hours).   LORazepam 1 MG tablet Commonly known as: ATIVAN Take 1 tablet (1 mg total) by mouth at bedtime. What changed:   when to take this  reasons to take this   losartan 25 MG tablet Commonly known as: COZAAR Take 25 mg by mouth daily.   methocarbamol 500 MG tablet Commonly known as: ROBAXIN Take 2 tablets (1,000 mg total) by mouth 4 (four) times daily.   Metoprolol Tartrate 75 MG Tabs Take 75 mg by mouth 2 (two) times daily. What changed:   medication strength  how much to take   Multi-Vitamin tablet Take 1 tablet by mouth at bedtime.   nicotine 7 mg/24hr patch Commonly known as: NICODERM CQ - dosed in mg/24 hr Place 1 patch (7 mg total) onto the skin daily. Start taking on: November 28, 2019   Oxycodone HCl 10 MG Tabs Take 1 tablet (10 mg total) by mouth every 6 (six) hours as needed for up to 5 days for severe pain.   pantoprazole 40 MG tablet Commonly known as: PROTONIX Take 1 tablet (40 mg total) by mouth daily. Start taking on: November 28, 2019               Discharge Care Instructions  (From admission, onward)           Start     Ordered   11/27/19 0000  Discharge wound care:       Comments: Keep the drain   11/27/19 0902            Follow-up  Information     [facility]. Schedule an appointment as soon as possible for a visit.   Contact information: [address] [place] [address] [phone]        Andria Meuse, MD. Go on 12/24/2019.   Specialty: General Surgery Why: Follow up appointment scheduled for 9:45 AM. Please arrive 15 min prior to appointment time for check in. Bring photo ID and any insurance information.  Contact information: [address] [place] [address] [phone]         Surgery, [place]. Go on 12/05/2019.   Specialty: General Surgery Why: Appointment for drain check and possible removal scheduled for 8:30 AM. Please arrive 30 min prior to appointment time.  Contact information: [address] [place] [address] [phone]  Time coordinating discharge: 39 minutes  Signed:  Yassmine Tamm  [facility] 11/27/2019, 9:02 AM

## 2019-12-27 ENCOUNTER — Other Ambulatory Visit: Payer: Self-pay | Admitting: Gastroenterology

## 2021-02-24 IMAGING — CT CT ABD-PELV W/ CM
1 of 3 series · 10 of 32 positions shown, 12 images · IV contrast (APPLIED)
Comparison: Pelvic sonogram of the same date.
COMPARISON: Pelvic sonogram of the same date.

Addendum:
CLINICAL DATA: Normal mass in pelvic cul-de-sac discovered on
pelvic sonogram

EXAM:
CT ABDOMEN AND PELVIS WITH CONTRAST
TECHNIQUE: Multidetector CT imaging of the abdomen and pelvis was performed
using the standard protocol following bolus administration of
intravenous contrast.
CONTRAST:  100mL REEU11-WMM IOPAMIDOL (REEU11-WMM) INJECTION 61%

[Series 2: abd/pelvis w/cm · axial · 0.84mm/px · z∈[+703,+1078]mm · 10 of 87 slices shown, 12 images]
[im 8/87  soft-tissue]
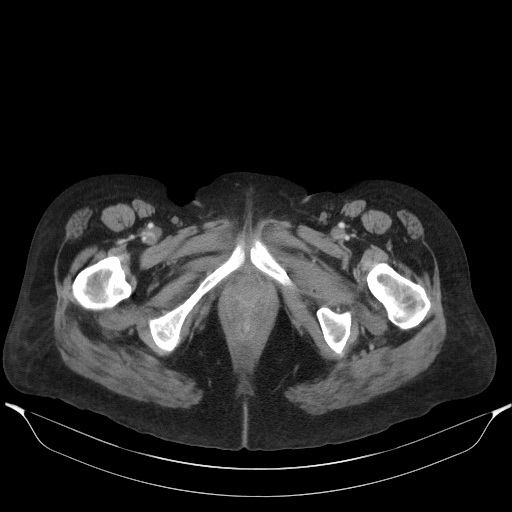
[im 8/87  bone]
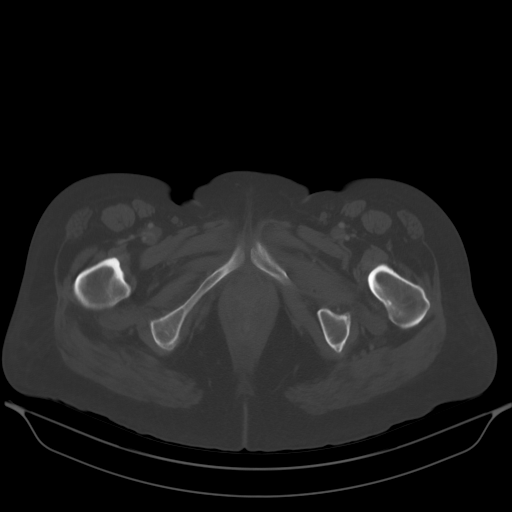
[im 20/87  soft-tissue]
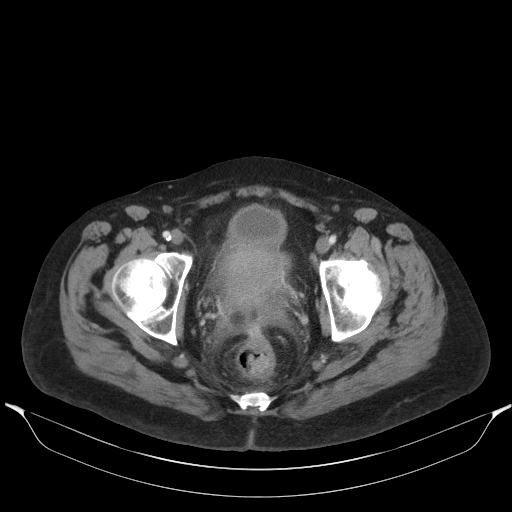
[im 32/87  soft-tissue]
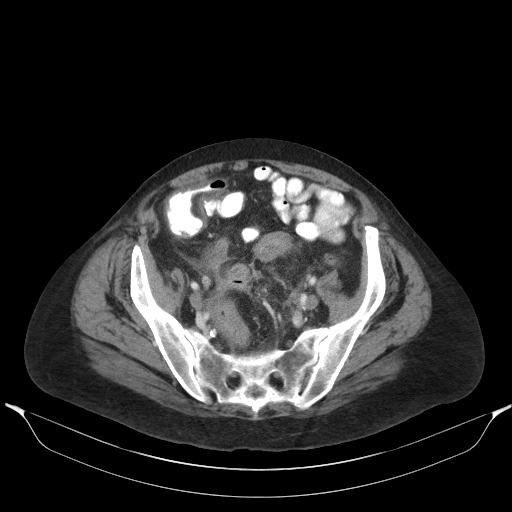
[im 44/87  soft-tissue]
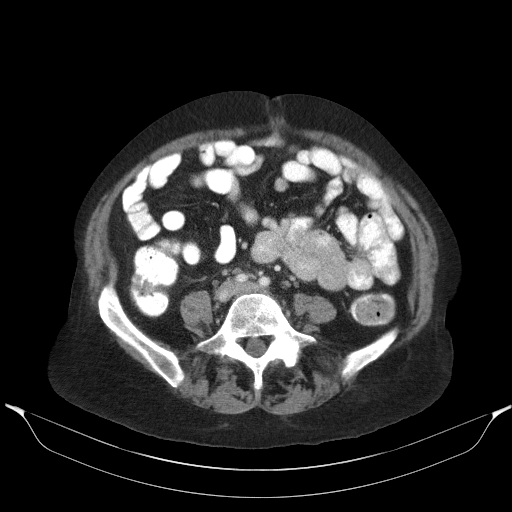
[im 55/87  soft-tissue]
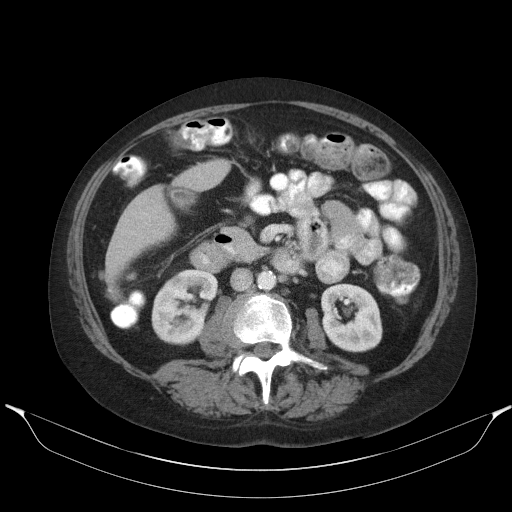
[im 67/87  soft-tissue]
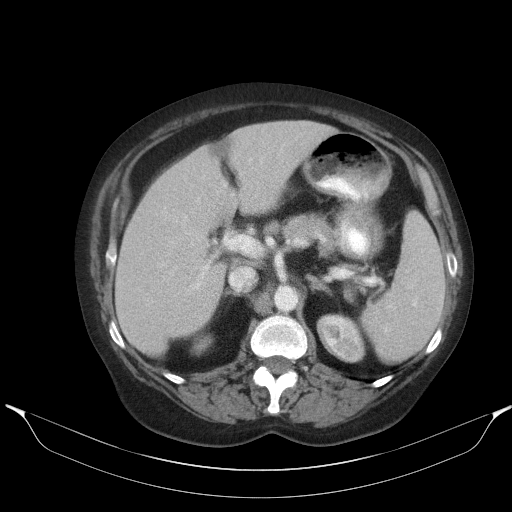
[im 71/87  lung]
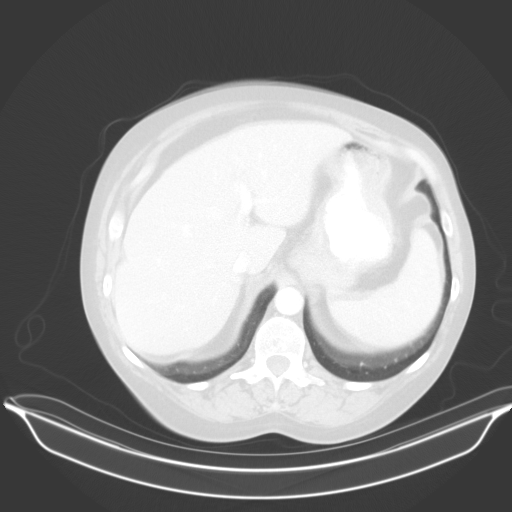
[im 75/87  lung]
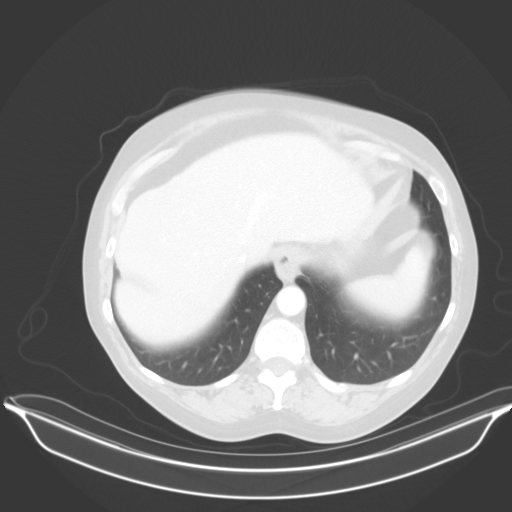
[im 79/87  soft-tissue]
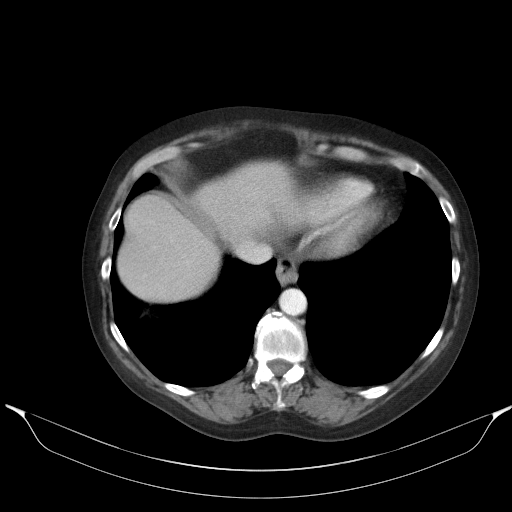
[im 79/87  lung]
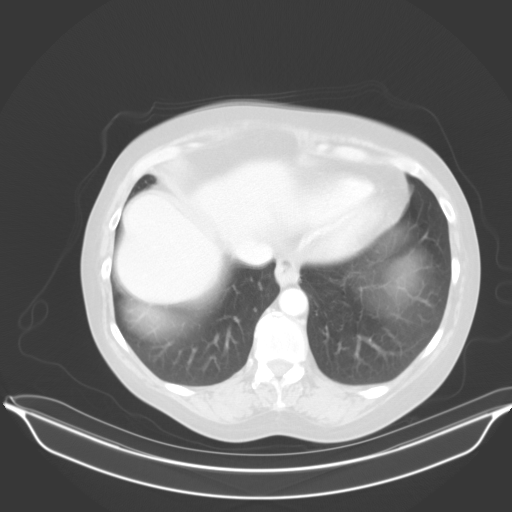
[im 83/87  lung]
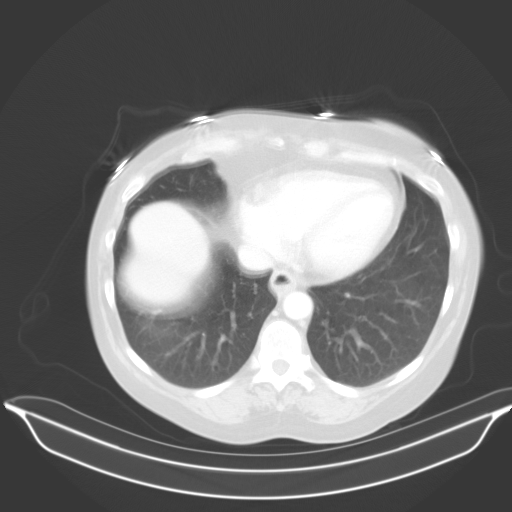

[10 of 32 positions shown; findings below may reference images not displayed]

FINDINGS: Lower chest: Limited assessment of the lung bases due to respiratory
motion.

Hepatobiliary: Pseudo lesion along the fissure for the falciform
ligament.

Decompressed gallbladder with very mild pericholecystic stranding.

Portal vein is patent.

No focal suspicious hepatic lesion.  Is

Pancreas: Pancreas is normal without ductal dilation or suspicious
lesion. No peripancreatic stranding.

Spleen: Spleen normal in size and contour.

Adrenals/Urinary Tract: Adrenal glands are normal.

Kidneys enhance symmetrically. Stranding about the distal ureters in
the setting of generalized pelvic inflammation. No hydronephrosis.
No gas in the urinary bladder though there is some stranding about
the posterior aspect of the urinary bladder

Stomach/Bowel: Stomach is under distended limiting assessment. No
acute small bowel process.

Thickening, mural stratification and abnormal enhancement of the
appendix. Appendiceal tip descends into the pelvis where it is lost
within a gas and fluid containing collection. A tract extending from
this collection extends to the RIGHT lateral wall of the sigmoid
colon. There is marked sigmoid thickening as well and there is
evidence of diverticular disease and clip generalized colonic
thickening. A mural sinus tract or fistula from the colon is evident
as well which potentially explains the gas within the collection.

2.9 x 2.4 cm fluid containing structure in the RIGHT adnexa. Marked
stranding about the pelvis in general but also outlining the
mesorectum with thickening of the high rectum through the sigmoid
colon. Fascial thickening and abnormal enhancement extending into
the cul-de-sac anterior to the colon best seen on image 65 series 2.
No free intraperitoneal air. Gas containing collection with
dependent fluid appears confined to the pelvis.

Along the RIGHT lateral aspect of the sigmoid just above the rectum
a 2.0 x 1.2 cm fluid collection is also present.

Vascular/Lymphatic: Calcified and noncalcified atheromatous plaque
of the abdominal aorta. No aneurysmal dilation. No adenopathy with
scattered numerous small lymph nodes in the retroperitoneum
presumably reactive. No pelvic lymphadenopathy. Also with scattered
lymph nodes throughout the pelvis the setting of inflammation.

Reproductive: Marked stranding in the cul-de-sac. Fluid and gas
tracking along the posterior uterus to the posterior uterine
segment. RIGHT adnexal cystic area as discussed. LEFT adnexa with
stranding otherwise unremarkable.

Other: Stranding extends throughout the pelvis involving fascial
planes including presacral space and outlining the mesorectal
fascia.

Musculoskeletal: No acute musculoskeletal process. No destructive
bone finding. Marked spinal degenerative changes greatest at L2-3
with disc space narrowing and mild retrolisthesis of L2 on L3. Facet
hypertrophy in the lower lumbar spine.
IMPRESSION: 1. Abscess associated with fistulous network in the pelvis related
to either perforated appendicitis or contained perforation of
diverticulitis. Above findings in the degree of inflammation suggest
chronicity. The appendiceal tip is not clearly delineated and the
appendix blends with the gas and fluid in the pelvis. Fluid and gas
also tracks into the wall of the colon from the collection.
2. Cystic area in the RIGHT adnexa may represent ovarian
involvement/abscess or ovarian cyst and is difficult to determine.
3. Other scattered areas of fluid and inflammation as outlined
above.
4. Multiple small areas of abnormal enhancement in fascial
thickening likely related to inflammation. Due to the severity of
the above findings would suggest follow-up colonoscopy when the
patient is able or comparison with recent colonoscopy results to
exclude the possibility of concurrent neoplasm though this is felt
less likely.
5. Decompressed gallbladder with very mild pericholecystic
stranding. Correlate given the decompressed nature of the
gallbladder this is favored to be a chronic finding. Correlation for
any abdominal pain may be helpful to determine whether RIGHT upper
quadrant ultrasound may be helpful.
6. Marked spinal degenerative changes greatest at L2-3 with disc
space narrowing and mild retrolisthesis of L2 on L3.
7. Aortic atherosclerosis.

A call is out to the referring provider to further discuss findings
in the above case.

Aortic Atherosclerosis ([id number].G).

ADDENDUM:
Areas of enhancement in the cul-de-sac extend towards the upper
vagina which may represent part of the fistulous network that
explains vaginal drainage. Overall this shows features of a complex
fistulous network associated with either perforated appendicitis or
sequela of diverticulitis. The imaging appearance would suggest this
is not acute, more likely subacute with signs of severe pelvic
inflammation.

These results were called by telephone at the time of interpretation
on 10/09/2019 at [DATE] to provider [address] , who verbally
acknowledged these results.

*** End of Addendum ***
FINDINGS: Lower chest: Limited assessment of the lung bases due to respiratory
motion.

Hepatobiliary: Pseudo lesion along the fissure for the falciform
ligament.

Decompressed gallbladder with very mild pericholecystic stranding.

Portal vein is patent.

No focal suspicious hepatic lesion.  Is

Pancreas: Pancreas is normal without ductal dilation or suspicious
lesion. No peripancreatic stranding.

Spleen: Spleen normal in size and contour.

Adrenals/Urinary Tract: Adrenal glands are normal.

Kidneys enhance symmetrically. Stranding about the distal ureters in
the setting of generalized pelvic inflammation. No hydronephrosis.
No gas in the urinary bladder though there is some stranding about
the posterior aspect of the urinary bladder

Stomach/Bowel: Stomach is under distended limiting assessment. No
acute small bowel process.

Thickening, mural stratification and abnormal enhancement of the
appendix. Appendiceal tip descends into the pelvis where it is lost
within a gas and fluid containing collection. A tract extending from
this collection extends to the RIGHT lateral wall of the sigmoid
colon. There is marked sigmoid thickening as well and there is
evidence of diverticular disease and clip generalized colonic
thickening. A mural sinus tract or fistula from the colon is evident
as well which potentially explains the gas within the collection.

2.9 x 2.4 cm fluid containing structure in the RIGHT adnexa. Marked
stranding about the pelvis in general but also outlining the
mesorectum with thickening of the high rectum through the sigmoid
colon. Fascial thickening and abnormal enhancement extending into
the cul-de-sac anterior to the colon best seen on image 65 series 2.
No free intraperitoneal air. Gas containing collection with
dependent fluid appears confined to the pelvis.

Along the RIGHT lateral aspect of the sigmoid just above the rectum
a 2.0 x 1.2 cm fluid collection is also present.

Vascular/Lymphatic: Calcified and noncalcified atheromatous plaque
of the abdominal aorta. No aneurysmal dilation. No adenopathy with
scattered numerous small lymph nodes in the retroperitoneum
presumably reactive. No pelvic lymphadenopathy. Also with scattered
lymph nodes throughout the pelvis the setting of inflammation.

Reproductive: Marked stranding in the cul-de-sac. Fluid and gas
tracking along the posterior uterus to the posterior uterine
segment. RIGHT adnexal cystic area as discussed. LEFT adnexa with
stranding otherwise unremarkable.

Other: Stranding extends throughout the pelvis involving fascial
planes including presacral space and outlining the mesorectal
fascia.

Musculoskeletal: No acute musculoskeletal process. No destructive
bone finding. Marked spinal degenerative changes greatest at L2-3
with disc space narrowing and mild retrolisthesis of L2 on L3. Facet
hypertrophy in the lower lumbar spine.
IMPRESSION: 1. Abscess associated with fistulous network in the pelvis related
to either perforated appendicitis or contained perforation of
diverticulitis. Above findings in the degree of inflammation suggest
chronicity. The appendiceal tip is not clearly delineated and the
appendix blends with the gas and fluid in the pelvis. Fluid and gas
also tracks into the wall of the colon from the collection.
2. Cystic area in the RIGHT adnexa may represent ovarian
involvement/abscess or ovarian cyst and is difficult to determine.
3. Other scattered areas of fluid and inflammation as outlined
above.
4. Multiple small areas of abnormal enhancement in fascial
thickening likely related to inflammation. Due to the severity of
the above findings would suggest follow-up colonoscopy when the
patient is able or comparison with recent colonoscopy results to
exclude the possibility of concurrent neoplasm though this is felt
less likely.
5. Decompressed gallbladder with very mild pericholecystic
stranding. Correlate given the decompressed nature of the
gallbladder this is favored to be a chronic finding. Correlation for
any abdominal pain may be helpful to determine whether RIGHT upper
quadrant ultrasound may be helpful.
6. Marked spinal degenerative changes greatest at L2-3 with disc
space narrowing and mild retrolisthesis of L2 on L3.
7. Aortic atherosclerosis.

A call is out to the referring provider to further discuss findings
in the above case.

Aortic Atherosclerosis ([id number]-[id number]).

## 2021-04-05 IMAGING — DX DG ABD PORTABLE 1V
1 series · 1 of 1 positions shown · non-contrast
Comparison: Portable exam 3777 hours compared to 11/12/2019

CLINICAL DATA: Nasogastric tube placement

EXAM:
PORTABLE ABDOMEN - 1 VIEW

[abdomen kub]
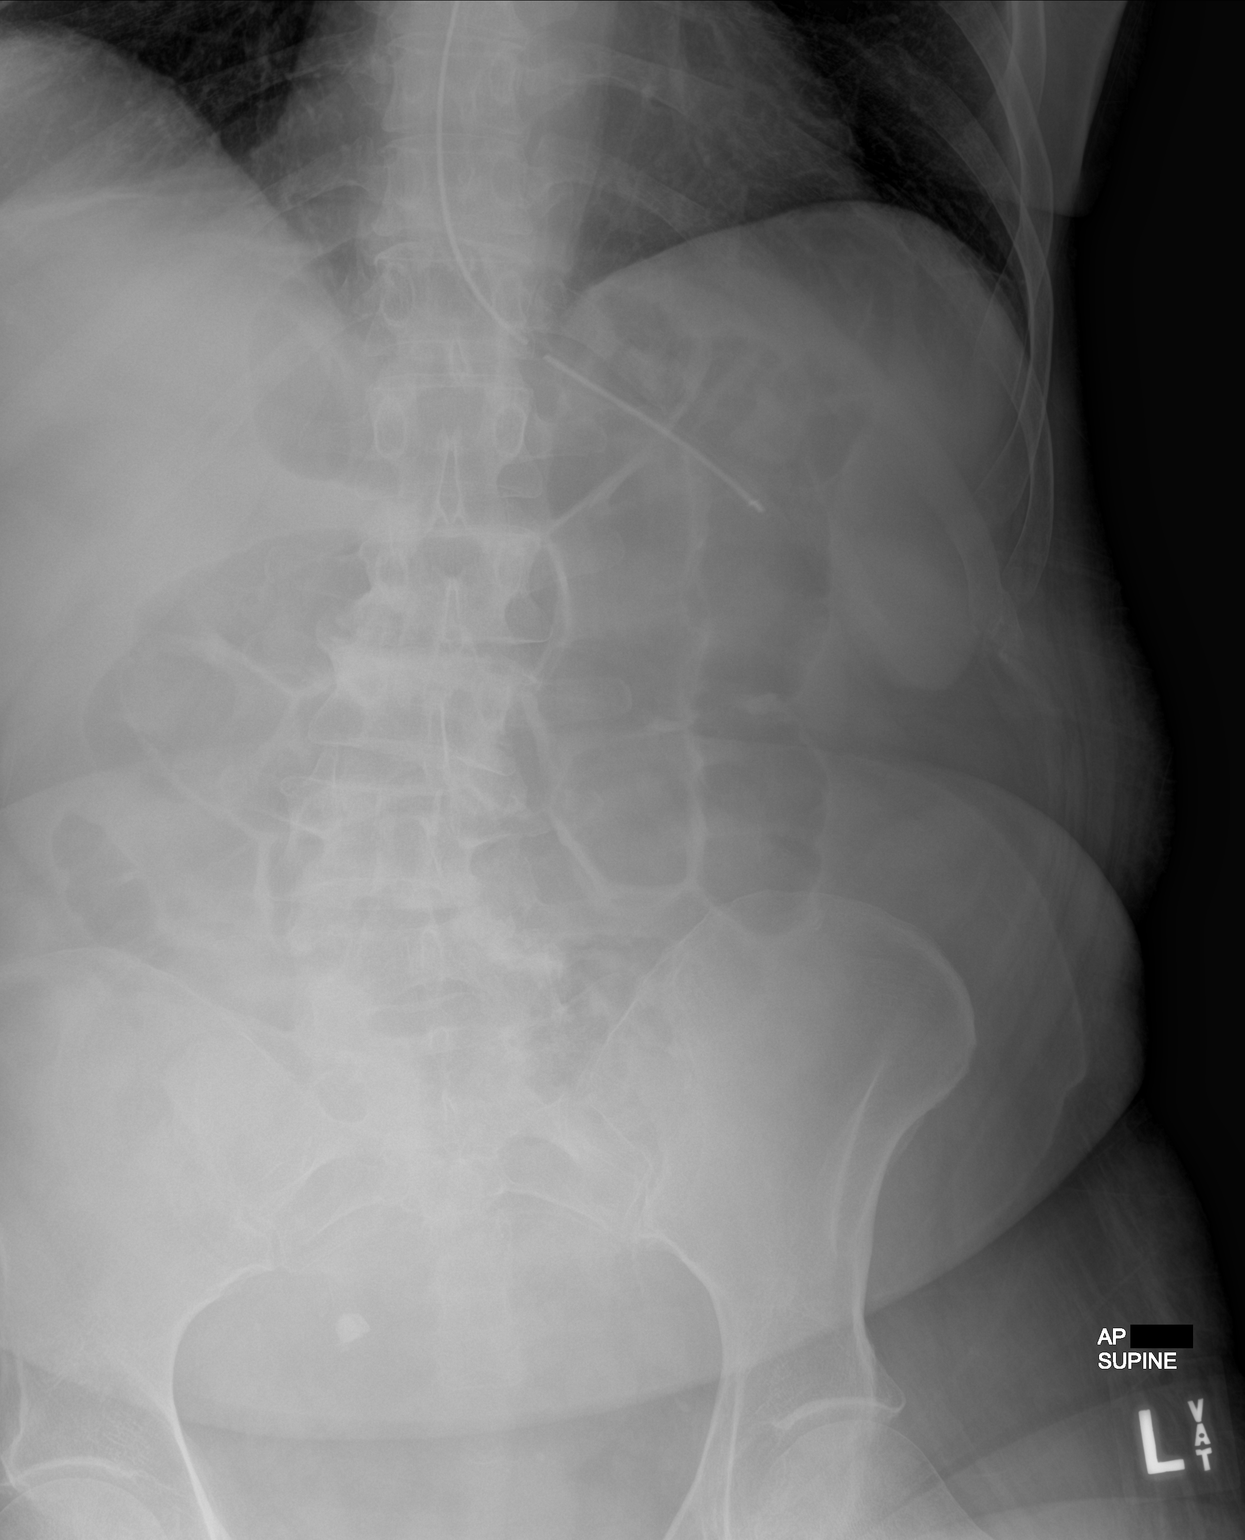

[1 of 1 positions shown; findings below may reference images not displayed]

FINDINGS: Tip of nasogastric tube projects over stomach though the proximal
side-port is at or just above the gastroesophageal junction,
recommend advancing tube 3 cm.

Air-filled loops of small bowel in the upper abdomen.

Lung bases clear.

Scoliosis and degenerative changes of lumbar spine.
IMPRESSION: Recommend advancing nasogastric tube 3 cm.

## 2021-04-08 IMAGING — DX DG ABDOMEN 1V
1 series · 1 of 1 positions shown · non-contrast
Comparison: 11/20/2019

CLINICAL DATA: Repositioning NG tube

EXAM:
ABDOMEN - 1 VIEW

[abdomen kub]
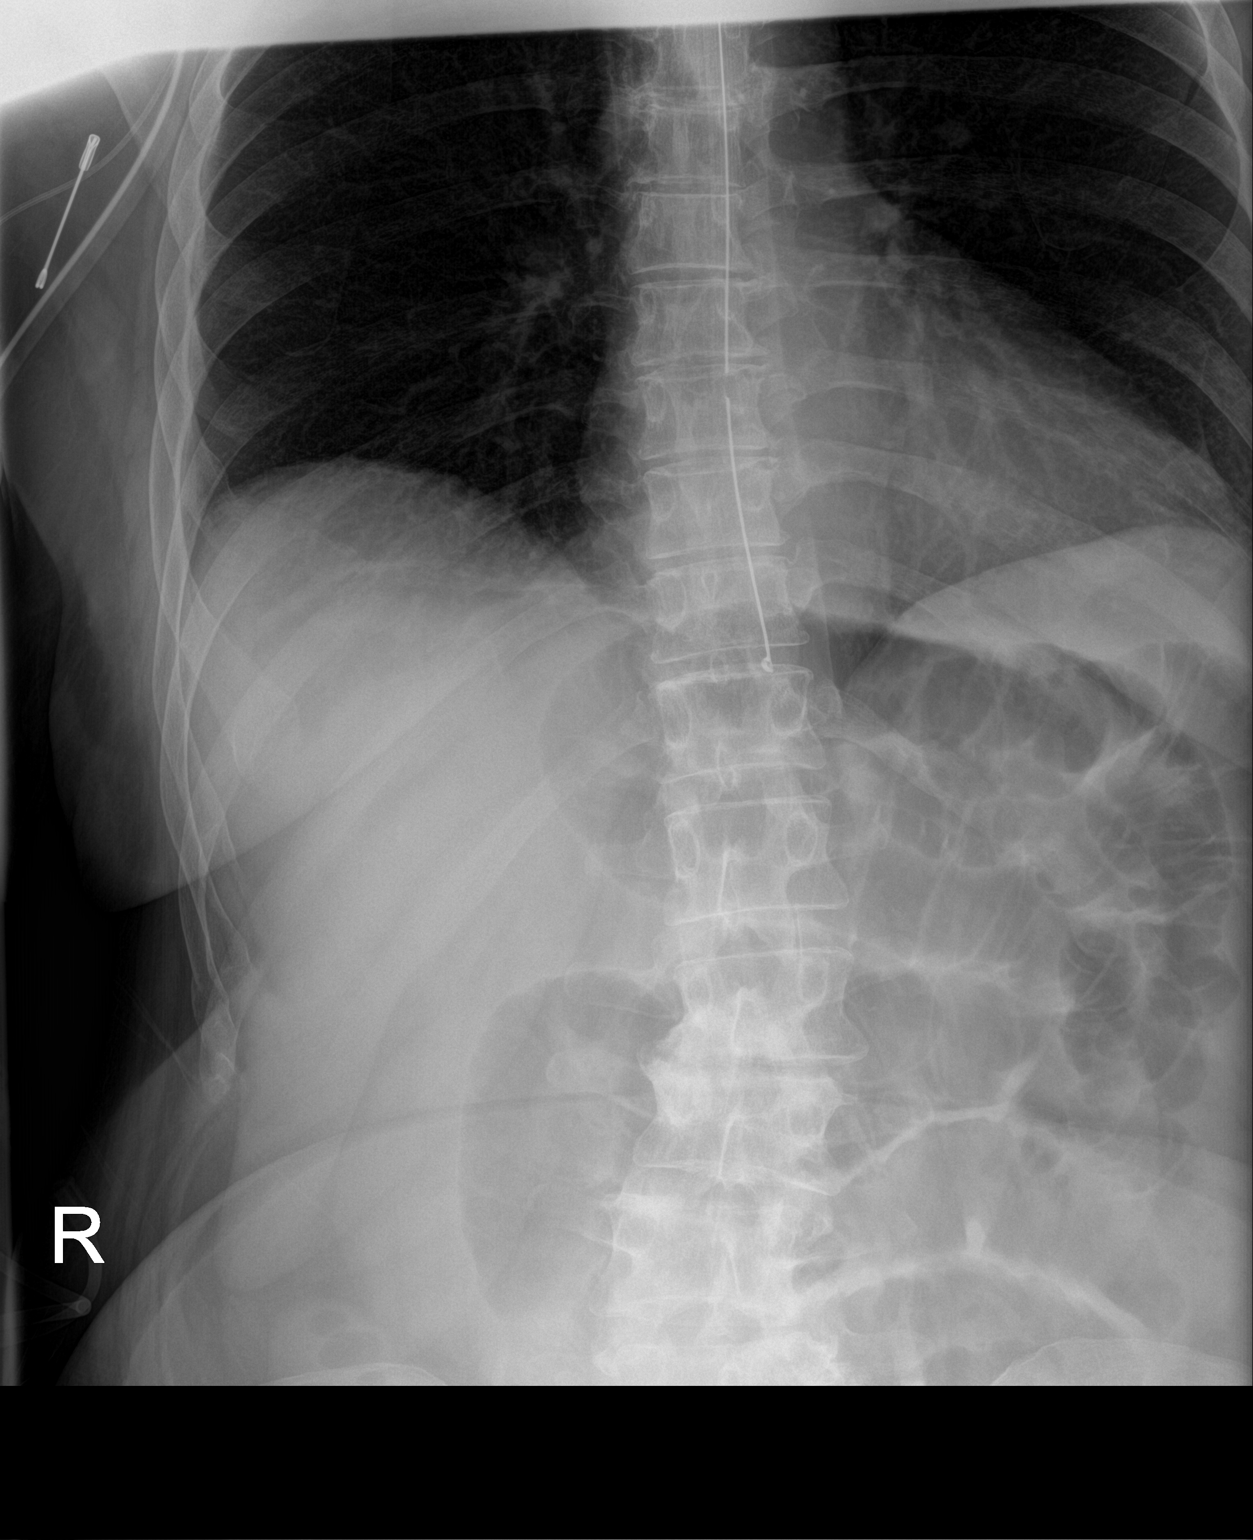

[1 of 1 positions shown; findings below may reference images not displayed]

FINDINGS: The enteric tube has withdrawn with tip now projected over the lower
esophagus and proximal side hole in the mid to lower esophagus. Gas
distended small bowel suggesting small bowel obstruction. Lung bases
are clear.
IMPRESSION: Enteric tube tip now projects over the lower esophagus. Advancement
is suggested. Gas distended small bowel suggesting small bowel
obstruction.

## 2022-01-21 DIAGNOSIS — Z419 Encounter for procedure for purposes other than remedying health state, unspecified: Secondary | ICD-10-CM | POA: Diagnosis not present

## 2022-02-21 DIAGNOSIS — Z419 Encounter for procedure for purposes other than remedying health state, unspecified: Secondary | ICD-10-CM | POA: Diagnosis not present

## 2022-03-24 DEATH — deceased

## 2022-12-07 ENCOUNTER — Other Ambulatory Visit: Payer: Self-pay
